# Patient Record
Sex: Female | Born: 1935 | Race: White | Hispanic: No | Smoking: Former smoker
Health system: Southern US, Community
[De-identification: ages and names within clinical notes are randomized; demographics above are authoritative.]

## PROBLEM LIST (undated history)

## (undated) DIAGNOSIS — M545 Low back pain, unspecified: Secondary | ICD-10-CM

## (undated) DIAGNOSIS — Z973 Presence of spectacles and contact lenses: Secondary | ICD-10-CM

## (undated) DIAGNOSIS — T8859XA Other complications of anesthesia, initial encounter: Secondary | ICD-10-CM

## (undated) DIAGNOSIS — Z923 Personal history of irradiation: Secondary | ICD-10-CM

## (undated) DIAGNOSIS — I2584 Coronary atherosclerosis due to calcified coronary lesion: Principal | ICD-10-CM

## (undated) DIAGNOSIS — T4145XA Adverse effect of unspecified anesthetic, initial encounter: Secondary | ICD-10-CM

## (undated) DIAGNOSIS — Z860100 Personal history of colon polyps, unspecified: Secondary | ICD-10-CM

## (undated) DIAGNOSIS — R002 Palpitations: Principal | ICD-10-CM

## (undated) DIAGNOSIS — Z9109 Other allergy status, other than to drugs and biological substances: Secondary | ICD-10-CM

## (undated) DIAGNOSIS — R0602 Shortness of breath: Secondary | ICD-10-CM

## (undated) DIAGNOSIS — I2510 Atherosclerotic heart disease of native coronary artery without angina pectoris: Principal | ICD-10-CM

## (undated) DIAGNOSIS — J449 Chronic obstructive pulmonary disease, unspecified: Secondary | ICD-10-CM

## (undated) DIAGNOSIS — K219 Gastro-esophageal reflux disease without esophagitis: Secondary | ICD-10-CM

## (undated) DIAGNOSIS — J189 Pneumonia, unspecified organism: Secondary | ICD-10-CM

## (undated) DIAGNOSIS — M1990 Unspecified osteoarthritis, unspecified site: Secondary | ICD-10-CM

## (undated) DIAGNOSIS — I499 Cardiac arrhythmia, unspecified: Secondary | ICD-10-CM

## (undated) DIAGNOSIS — D126 Benign neoplasm of colon, unspecified: Secondary | ICD-10-CM

## (undated) DIAGNOSIS — E049 Nontoxic goiter, unspecified: Secondary | ICD-10-CM

## (undated) DIAGNOSIS — C342 Malignant neoplasm of middle lobe, bronchus or lung: Secondary | ICD-10-CM

## (undated) DIAGNOSIS — E785 Hyperlipidemia, unspecified: Secondary | ICD-10-CM

## (undated) DIAGNOSIS — M810 Age-related osteoporosis without current pathological fracture: Secondary | ICD-10-CM

## (undated) DIAGNOSIS — G8929 Other chronic pain: Secondary | ICD-10-CM

## (undated) DIAGNOSIS — Z8719 Personal history of other diseases of the digestive system: Secondary | ICD-10-CM

## (undated) DIAGNOSIS — I493 Ventricular premature depolarization: Secondary | ICD-10-CM

## (undated) DIAGNOSIS — C50911 Malignant neoplasm of unspecified site of right female breast: Secondary | ICD-10-CM

## (undated) DIAGNOSIS — Z9981 Dependence on supplemental oxygen: Secondary | ICD-10-CM

## (undated) HISTORY — PX: TOTAL ABDOMINAL HYSTERECTOMY: SHX209

## (undated) HISTORY — PX: CATARACT EXTRACTION W/ INTRAOCULAR LENS  IMPLANT, BILATERAL: SHX1307

## (undated) HISTORY — DX: Coronary atherosclerosis due to calcified coronary lesion: I25.84

## (undated) HISTORY — DX: Nontoxic goiter, unspecified: E04.9

## (undated) HISTORY — PX: COLONOSCOPY W/ BIOPSIES: SHX1374

## (undated) HISTORY — DX: Malignant neoplasm of middle lobe, bronchus or lung: C34.2

## (undated) HISTORY — DX: Benign neoplasm of colon, unspecified: D12.6

## (undated) HISTORY — DX: Atherosclerotic heart disease of native coronary artery without angina pectoris: I25.10

## (undated) HISTORY — DX: Personal history of colonic polyps: Z86.010

## (undated) HISTORY — PX: TONSILLECTOMY: SUR1361

## (undated) HISTORY — DX: Ventricular premature depolarization: I49.3

## (undated) HISTORY — DX: Palpitations: R00.2

## (undated) HISTORY — DX: Hyperlipidemia, unspecified: E78.5

## (undated) HISTORY — PX: DILATION AND CURETTAGE OF UTERUS: SHX78

## (undated) HISTORY — PX: APPENDECTOMY: SHX54

## (undated) HISTORY — DX: Personal history of colon polyps, unspecified: Z86.0100

---

## 1998-01-09 ENCOUNTER — Other Ambulatory Visit: Admission: RE | Admit: 1998-01-09 | Discharge: 1998-01-09 | Payer: Self-pay | Admitting: Obstetrics and Gynecology

## 1999-01-23 ENCOUNTER — Other Ambulatory Visit: Admission: RE | Admit: 1999-01-23 | Discharge: 1999-01-23 | Payer: Self-pay | Admitting: Obstetrics and Gynecology

## 2000-02-22 ENCOUNTER — Other Ambulatory Visit: Admission: RE | Admit: 2000-02-22 | Discharge: 2000-02-22 | Payer: Self-pay | Admitting: Obstetrics and Gynecology

## 2001-03-10 ENCOUNTER — Other Ambulatory Visit: Admission: RE | Admit: 2001-03-10 | Discharge: 2001-03-10 | Payer: Self-pay | Admitting: Obstetrics and Gynecology

## 2001-09-27 ENCOUNTER — Ambulatory Visit: Admission: RE | Admit: 2001-09-27 | Discharge: 2001-09-27 | Payer: Self-pay | Admitting: Obstetrics and Gynecology

## 2001-10-02 ENCOUNTER — Ambulatory Visit (HOSPITAL_COMMUNITY): Admission: RE | Admit: 2001-10-02 | Discharge: 2001-10-02 | Payer: Self-pay | Admitting: Internal Medicine

## 2002-05-07 ENCOUNTER — Other Ambulatory Visit: Admission: RE | Admit: 2002-05-07 | Discharge: 2002-05-07 | Payer: Self-pay | Admitting: Obstetrics and Gynecology

## 2003-07-01 ENCOUNTER — Other Ambulatory Visit: Admission: RE | Admit: 2003-07-01 | Discharge: 2003-07-01 | Payer: Self-pay | Admitting: Obstetrics and Gynecology

## 2003-09-18 ENCOUNTER — Ambulatory Visit (HOSPITAL_COMMUNITY): Admission: RE | Admit: 2003-09-18 | Discharge: 2003-09-18 | Payer: Self-pay | Admitting: Gastroenterology

## 2003-09-18 ENCOUNTER — Encounter (INDEPENDENT_AMBULATORY_CARE_PROVIDER_SITE_OTHER): Payer: Self-pay | Admitting: Specialist

## 2005-09-02 ENCOUNTER — Other Ambulatory Visit: Admission: RE | Admit: 2005-09-02 | Discharge: 2005-09-02 | Payer: Self-pay | Admitting: Obstetrics and Gynecology

## 2005-09-27 ENCOUNTER — Encounter: Admission: RE | Admit: 2005-09-27 | Discharge: 2005-09-27 | Payer: Self-pay | Admitting: Obstetrics and Gynecology

## 2005-10-10 ENCOUNTER — Encounter: Admission: RE | Admit: 2005-10-10 | Discharge: 2005-10-10 | Payer: Self-pay | Admitting: Obstetrics and Gynecology

## 2005-10-20 ENCOUNTER — Ambulatory Visit: Payer: Self-pay | Admitting: Oncology

## 2006-03-30 ENCOUNTER — Ambulatory Visit: Payer: Self-pay | Admitting: Pulmonary Disease

## 2006-06-16 ENCOUNTER — Emergency Department (HOSPITAL_COMMUNITY): Admission: EM | Admit: 2006-06-16 | Discharge: 2006-06-16 | Payer: Self-pay | Admitting: Emergency Medicine

## 2010-02-23 ENCOUNTER — Encounter: Admission: RE | Admit: 2010-02-23 | Discharge: 2010-02-23 | Payer: Self-pay | Admitting: Family Medicine

## 2010-08-07 ENCOUNTER — Other Ambulatory Visit: Payer: Self-pay | Admitting: Family Medicine

## 2010-08-07 DIAGNOSIS — E049 Nontoxic goiter, unspecified: Secondary | ICD-10-CM

## 2010-08-07 DIAGNOSIS — E041 Nontoxic single thyroid nodule: Secondary | ICD-10-CM

## 2010-08-18 ENCOUNTER — Ambulatory Visit
Admission: RE | Admit: 2010-08-18 | Discharge: 2010-08-18 | Disposition: A | Discharge status: Home / Self Care | Payer: Medicare Other | Source: Ambulatory Visit | Attending: Family Medicine | Admitting: Family Medicine

## 2010-08-18 DIAGNOSIS — E041 Nontoxic single thyroid nodule: Secondary | ICD-10-CM

## 2010-08-18 DIAGNOSIS — E049 Nontoxic goiter, unspecified: Secondary | ICD-10-CM

## 2010-10-23 NOTE — Assessment & Plan Note (Signed)
[facility]                               PULMONARY OFFICE NOTE   NAME:Heather Barnes, Heather Barnes                   MRN:          [id number]  DATE:03/30/2006                            DOB:          [birth year]    HISTORY OF PRESENT ILLNESS:  The patient is a 77 year old female who I have  been asked to see for chronic obstructive pulmonary disease.  The patient  has a history of longstanding smoking and carries the diagnosis of  emphysema.  She recently underwent pulmonary function studies this month  where she was found to be have an FEV1 of 570 cc and an FEV1 percent of 52.  This is consistent with moderate to severe chronic  emphysema.  The patient  currently is being managed on Advair as well as Atrovent inhaler.  She was  tried on Spiriva, but had difficulties with hives and also sores in her  mouth.  Patient states that she has been short of breath for about 12 years,  and that it has been slowly progressive.  She will get dyspneic with  vacuuming one room of her house, making a bed or even bringing one bag of  groceries in from the car.  She has approximately 2 to 3 block dyspnea on  exertion, but admits it is difficult for her to estimate this.  She has no  significant cough or mucus production since she has quit smoking.   PAST MEDICAL HISTORY:  1. Significant for emphysema as stated above.  2. Allergic rhinitis.  3. Status post hysterectomy.  4. Status post appendectomy.   CURRENT MEDICATIONS:  1. Advair 250/50 one b.i.d.  2. Atrovent inhaler 2 sprays q. 6 hours.  3. Fosamax 70 mg q. week.  4. Lipitor 20 mg q. daily.  5. Calcium q. daily.  6. Centrum Silver q. daily.   ALLERGIES:  She has an intolerance to codeine.   SOCIAL HISTORY:  She is married and has children.  She lives with her  husband.  She has a history of smoking 1 pack a day for many years, but has  not done so since 2001.   FAMILY HISTORY:  Remarkable for her mother  having heart disease, but not  until age 77.  She has an extensive history of cancer with sisters having  throat, lung and brain cancer, also a daughter with breast cancer.   REVIEW OF SYMPTOMS:  As per history of present illness.  Also, see patient  intake form documented in the chart.   PHYSICAL EXAMINATION:  GENERAL:  She is a well-developed female, in no acute  distress.  Blood pressure is 140/78.  Pulse 81.  Temperature is 98.  Weight is 134  pounds.  02 saturation on room air is 94%.  HEENT:  Pupils equal, round and reactive to light and accommodation.  Extraocular muscles are intact.  Nares are without discharge.  Oropharynx is  clear.  NECK:  Supple without JVD or lymphadenopathy.  There is no palpable  thyromegaly.  CHEST:  Reveals decreased breath sounds throughout, but no active wheezing.  CARDIAC:  Exam  reveals regular rate and rhythm.  No murmurs, rubs or  gallops.  ABDOMEN:  Soft and non-tender with good bowel sounds.  GENITAL EXAM:  Not done and not indicated.  RECTAL EXAM:  Not done and not indicated.  BREAST EXAM:  Not done and not indicated.  EXTREMITIES:  Lower extremities show trace edema.  Pulses are intact  distally.  NEUROLOGIC:  She is alert and oriented with no obvious motor deficits.   IMPRESSION:  Moderate to severe emphysema documented by pulmonary function  studies.  The patient has noticed increasing dyspnea on exertion that has  been slowly progressive and definitely interferes with her quality of life.  She has gotten down to a low enough FEV1 that this is not unexpected.  She  is on a fairly good regimen of bronchodilators, and it is unfortunate that  she is not able to tolerate Spiriva.  At some point in time she may need to  change over to nebulizers, but I would not do that at this point in time.  I  have stressed to her the importance of getting on some type of exercise  program such as pulmonary rehab and also keeping her weight under  control.  I have also talked with her about keeping up to date on her Pneumovax and  flu vaccine.   PLAN:  1. No further recommendations in terms of medication changes.  I do not      think the patient needs nebulizers at this point in time, but may get      there very quickly over the next year or two.  2. I have strongly encouraged the patient to participate in a pulmonary      rehab program at [facility], however, she states that she does      not have time for this.  She will try to pursue this on her own at      home.  3. The patient does not need supplemental oxygen at this time.  We have      documented no evidence for 02 desaturation with vigorous walking in the      office.  4. The patient will follow up on a p.r.n. basis, however, I will be more      than happy to see at any time should problems arise.    ______________________________  Barbaraann Share, MD,FCCP    [facility]  DD: 04/11/2006  DT: 04/12/2006  Job #: [id number]   cc:   Pam Drown, M.D.

## 2010-10-23 NOTE — Op Note (Signed)
NAME:  Heather Barnes, Heather Barnes                      ACCOUNT NO.:  [id number]   MEDICAL RECORD NO.:  [id number]                   PATIENT TYPE:  AMB   LOCATION:  ENDO                                 FACILITY:  [facility]   PHYSICIAN:  Graylin Shiver, M.D.                DATE OF BIRTH:  [birth year]   DATE OF PROCEDURE:  09/18/2003  DATE OF DISCHARGE:                                 OPERATIVE REPORT   PROCEDURE:  Colonoscopy with biopsy.   INDICATIONS FOR PROCEDURE:  Screening.   DESCRIPTION OF PROCEDURE:  Informed consent was obtained after explanation  of the risks of bleeding, infection, and perforation.  Premedications are fentanyl 75 mcg IV, Versed 6 mg IV.   With the patient in the left lateral decubitus position, the rectal exam was  performed.  No masses were felt.  The Olympus colonoscope was inserted into  the rectum and advanced around the colon to the cecum.  Cecal landmarks were  identified.  In the cecum, there was a small 3 mm polyp biopsied off with  cold forceps.  The ascending colon looked normal.  The transverse colon  looked normal.  The descending colon looked normal.  In the distal sigmoid  and rectum, there were several small 2-3 mm polyps biopsied off with cold  forceps.  She tolerated the procedure well without complications.   IMPRESSION:  Several small colon polyps.   PLAN:  The pathology will be checked.                                               Graylin Shiver, M.D.    [facility]  D:  09/18/2003  T:  09/18/2003  Job:  [id number]   cc:   Pam Drown, M.D.  [address]  [place]  [place] [address]  Fax: [phone]

## 2010-12-25 ENCOUNTER — Other Ambulatory Visit: Payer: Self-pay | Admitting: Family Medicine

## 2010-12-25 ENCOUNTER — Ambulatory Visit
Admission: RE | Admit: 2010-12-25 | Discharge: 2010-12-25 | Disposition: A | Discharge status: Home / Self Care | Payer: Medicare Other | Source: Ambulatory Visit | Attending: Family Medicine | Admitting: Family Medicine

## 2010-12-25 DIAGNOSIS — R911 Solitary pulmonary nodule: Secondary | ICD-10-CM

## 2010-12-25 MED ORDER — IOHEXOL 300 MG/ML  SOLN: 75.0000 mL | Freq: Once | INTRAMUSCULAR | Status: AC | PRN

## 2011-01-12 ENCOUNTER — Encounter: Payer: Self-pay | Admitting: Internal Medicine

## 2011-01-13 ENCOUNTER — Ambulatory Visit (INDEPENDENT_AMBULATORY_CARE_PROVIDER_SITE_OTHER): Payer: Medicare Other | Admitting: Internal Medicine

## 2011-01-13 ENCOUNTER — Encounter: Payer: Self-pay | Admitting: Internal Medicine

## 2011-01-13 ENCOUNTER — Telehealth: Payer: Self-pay | Admitting: Internal Medicine

## 2011-01-13 VITALS — BP 130/68 | HR 99 | Temp 98.9°F | Ht 59.0 in | Wt 129.0 lb

## 2011-01-13 DIAGNOSIS — R0789 Other chest pain: Secondary | ICD-10-CM

## 2011-01-13 DIAGNOSIS — J449 Chronic obstructive pulmonary disease, unspecified: Secondary | ICD-10-CM

## 2011-01-13 DIAGNOSIS — R0602 Shortness of breath: Secondary | ICD-10-CM

## 2011-01-13 DIAGNOSIS — J984 Other disorders of lung: Secondary | ICD-10-CM

## 2011-01-13 DIAGNOSIS — R911 Solitary pulmonary nodule: Secondary | ICD-10-CM

## 2011-01-13 NOTE — Telephone Encounter (Signed)
Please ensure oncimmune blood test. If you do it next 2-3 days then she should fu with me 3 weeks later approx so I can rview her with results

## 2011-01-13 NOTE — Assessment & Plan Note (Signed)
#  chest pain  - i think this is due to rib joint inflammation  - because you cannot take drugs like ibuprofen, continue hot compress  - please follow with primary care for this

## 2011-01-13 NOTE — Progress Notes (Signed)
Subjective:    Patient ID: Heather Barnes, female    DOB: 05-03-1934, 77 y.o.   MRN: [id number]  HPI  77 year old ex 20 pack  Smoker quit 2000. REferred for severe dyspnea, copd on ct chest, and pulmonary nodule  1. Dyspnea: insidious onset 6-8 years ago when asthma was diagnosed by Dr Corliss Blacker at [facility]. Recollects seeing someone at [facility] pulmonary back then and started on spiriva but stopped due to rash. Prior advair Rx did not help. Currently on symbicort and atrovent which help. Associated tiredness present. Progressive symptoms esp few months.  Dyspnea also worse and now brought ton by activities like taking a shower/bath  X 2-3 months which "wipes me out". Describes it as severe when it comes on. No prior O2 treatment but when we walked her she desaturated after walking 185 feet x __ laps in room air. Wants to be able to do better to be with grandkids  2. Chest pain - Acute onset few weeks ago when she woke up. She could not move at that time and rated it as severe. Location was  left infra-clavicular and supra-mammary area. Improved by singulair that was started few weeks ago but feels when it effect wears off in morning pain worse. Symbicort helps pain. Feels it is a soreness. Constant. With meds it is currently very mmild (notices it when she touches it). At its worst, moderate. However, at onset it was severe. Says pred burst has helped pain (though pred made her have tremors). She feels it is radiating to back and left arm and feels she might have pulled a muscle. EKG at PMD office normal. DEnies prior CAD or cold or cough or sputum. Currently pain is improving. Does not want to do NSAId due to fear of bleedin (prior GI ulcer +)  3. Ct chest 12/25/10 done for above with IV contrast  -  No comment about PE. severe emphysema and new RML lateral segment subpleural 8mm nodule (new since 12/25/10).. This is first time she learned of emphysema diagnosis.  Sisters and brothers (total 3)  - died of cancer. She is very worried about possible malignancy. Does not want spouse to know of nodule right now.     Past Medical History  Diagnosis Date  . Asthma   . Hx of colonic polyps   . Colon adenomas   . Osteoporosis   . Hyperlipidemia   . Goiter      Family History  Problem Relation Age of Onset  . Emphysema Maternal Uncle   . Heart disease Mother   . Heart disease Maternal Grandfather   . Rheum arthritis Sister   . Cancer Sister   . Cancer Sister     throat cancer  . Brain cancer Sister   . Ovarian cancer Sister      History   Social History  . Marital Status: Married    Spouse Name: N/A    Number of Children: N/A  . Years of Education: N/A   Occupational History  . retired    Social History Main Topics  . Smoking status: Former Smoker -- 0.5 packs/day for 40 years    Types: Cigarettes    Quit date: 06/07/1998  . Smokeless tobacco: Not on file  . Alcohol Use: Not on file  . Drug Use: Not on file  . Sexually Active: Not on file   Other Topics Concern  . Not on file   Social History Narrative  . No narrative on  file     Allergies  Allergen Reactions  . Spiriva Handihaler     rash     Outpatient Prescriptions Prior to Visit  Medication Sig Dispense Refill  . albuterol (PROAIR HFA) 108 (90 BASE) MCG/ACT inhaler Inhale 2 puffs into the lungs every 6 (six) hours as needed.        [name] alendronate (FOSAMAX) 70 MG tablet Take 70 mg by mouth every 7 (seven) days. Take with a full glass of water on an empty stomach.       . budesonide-formoterol (SYMBICORT) 160-4.5 MCG/ACT inhaler Inhale 2 puffs into the lungs 2 (two) times daily.        . cetirizine (ZYRTEC) 10 MG tablet Take 10 mg by mouth daily.        . Cholecalciferol (VITAMIN D) 2000 UNITS CAPS Take 2 capsules by mouth daily.        . cyclobenzaprine (FLEXERIL) 5 MG tablet Take 5 mg by mouth 3 (three) times daily as needed.        . fluticasone (FLONASE) 50 MCG/ACT nasal spray Place 2 sprays  into the nose daily.        [name] ipratropium (ATROVENT HFA) 17 MCG/ACT inhaler Inhale 2 puffs into the lungs every 6 (six) hours.        . Multiple Vitamins-Minerals (MULTIVITAMIN WITH MINERALS) tablet Take 1 tablet by mouth daily.            Review of Systems  Constitutional: Negative for fever and unexpected weight change.  HENT: Negative for ear pain, nosebleeds, congestion, sore throat, rhinorrhea, sneezing, trouble swallowing, dental problem, postnasal drip and sinus pressure.   Eyes: Negative for redness and itching.  Respiratory: Positive for shortness of breath. Negative for cough, chest tightness and wheezing.   Cardiovascular: Positive for chest pain. Negative for palpitations and leg swelling.  Gastrointestinal: Negative for nausea and vomiting.  Genitourinary: Negative for dysuria.  Musculoskeletal: Negative for joint swelling.  Skin: Negative for rash.  Neurological: Negative for headaches.  Hematological: Does not bruise/bleed easily.  Psychiatric/Behavioral: Negative for dysphoric mood. The patient is not nervous/anxious.        Objective:   Physical Exam  Vitals reviewed. Constitutional: She is oriented to person, place, and time. She appears well-developed and well-nourished. No distress.  HENT:  Head: Normocephalic and atraumatic.  Right Ear: External ear normal.  Left Ear: External ear normal.  Mouth/Throat: Oropharynx is clear and moist. No oropharyngeal exudate.  Eyes: Conjunctivae and EOM are normal. Pupils are equal, round, and reactive to light. Right eye exhibits no discharge. Left eye exhibits no discharge. No scleral icterus.  Neck: Normal range of motion. Neck supple. No JVD present. No tracheal deviation present. No thyromegaly present.  Cardiovascular: Normal rate, regular rhythm, normal heart sounds and intact distal pulses.  Exam reveals no gallop and no friction rub.   No murmur heard. Pulmonary/Chest: Effort normal and breath sounds normal. No  respiratory distress. She has no wheezes. She has no rales. She exhibits no tenderness.       barrell chest Overall prolonged expiration Focal tenderness at left 3-4th costochondral junction  Abdominal: Soft. Bowel sounds are normal. She exhibits no distension and no mass. There is no tenderness. There is no rebound and no guarding.  Musculoskeletal: Normal range of motion. She exhibits no edema and no tenderness.  Lymphadenopathy:    She has no cervical adenopathy.  Neurological: She is alert and oriented to person, place, and time. She has normal reflexes.  No cranial nerve deficit. She exhibits normal muscle tone. Coordination normal.  Skin: Skin is warm and dry. No rash noted. She is not diaphoretic. No erythema. No pallor.  Psychiatric: She has a normal mood and affect. Her behavior is normal. Judgment and thought content normal.       Tears easy when bad news broken to          Assessment & Plan:

## 2011-01-13 NOTE — Assessment & Plan Note (Addendum)
#  Shortness of breath  - due to copd which I think is severe  - you need oxygen 2L with exertion and when you sleep  - continue your current meds  - we will refer you to pulmonary rehab  - have pft breathing test in 3 weeks or so - check alpha 1 next visit

## 2011-01-13 NOTE — Patient Instructions (Addendum)
#  chest pain  - i think this is due to rib joint inflammation  - because you cannot take drugs like ibuprofen, continue hot compress  - please follow with primary care for this #Shortness of breath  - due to copd which I think is severe  - you need oxygen 2L with exertion and when you sleep  - continue your current meds  - we will refer you to pulmonary rehab  - have pft breathing test in 3 weeks or so - check alpha 1 next visit #Lung nodule  - do blood test for oncimmune antigen panel (currently kit not available) - you need repeat CT chest without contrast 3 months from 12/25/2010 #followup - call in 2-4 days to see if we have our blood test kit - return in 3-4 weeks for followup with full PFT breathing test

## 2011-01-13 NOTE — Assessment & Plan Note (Addendum)
#  Lung nodule - 8mm RML lateral segment nodule that is new. Intermediate prob for malignancy  PLAN  - do blood test for oncimmune antigen panel (currently kit not available) - you need repeat CT chest without contrast 3 months from 12/25/2010 #followup - call in 2-4 days to see if we have our blood test kit - return in 3-4 weeks for followup with full PFT breathing test

## 2011-01-14 NOTE — Telephone Encounter (Signed)
Pt advised that onc-immune test is here. Carron Curie, CMA

## 2011-01-19 ENCOUNTER — Telehealth: Payer: Self-pay | Admitting: Internal Medicine

## 2011-01-19 NOTE — Telephone Encounter (Signed)
Per Victorino Dike, this was done yesterday morning, Jan 18, 2011 and [facility] has already been notified.  Will sign off on this.

## 2011-01-26 ENCOUNTER — Telehealth: Payer: Self-pay | Admitting: Internal Medicine

## 2011-01-26 NOTE — Telephone Encounter (Signed)
Reviewed oncimmune - one of those antigens is positive raising possibility nodule is cancer. I will print out formal print out when I am in office; currntly online. I do not want to discuss result over phone. SHe teared up at last visit and does not want husband to know about nodule. So, I will discuss with her directly. If she calls, please direct call to me

## 2011-01-27 ENCOUNTER — Telehealth: Payer: Self-pay | Admitting: Internal Medicine

## 2011-01-27 NOTE — Telephone Encounter (Signed)
ono 01/19/11 desaturates. Needs o2. I will d/w her at fu

## 2011-02-05 ENCOUNTER — Encounter: Payer: Self-pay | Admitting: Internal Medicine

## 2011-02-16 ENCOUNTER — Encounter (HOSPITAL_COMMUNITY)
Admission: RE | Admit: 2011-02-16 | Discharge: 2011-02-16 | Disposition: A | Discharge status: Home / Self Care | Payer: Medicare Other | Source: Ambulatory Visit | Attending: Internal Medicine | Admitting: Internal Medicine

## 2011-02-16 DIAGNOSIS — Z9981 Dependence on supplemental oxygen: Secondary | ICD-10-CM | POA: Insufficient documentation

## 2011-02-16 DIAGNOSIS — R0602 Shortness of breath: Secondary | ICD-10-CM | POA: Insufficient documentation

## 2011-02-16 DIAGNOSIS — R0609 Other forms of dyspnea: Secondary | ICD-10-CM | POA: Insufficient documentation

## 2011-02-16 DIAGNOSIS — R0989 Other specified symptoms and signs involving the circulatory and respiratory systems: Secondary | ICD-10-CM | POA: Insufficient documentation

## 2011-02-16 DIAGNOSIS — J449 Chronic obstructive pulmonary disease, unspecified: Secondary | ICD-10-CM | POA: Insufficient documentation

## 2011-02-16 DIAGNOSIS — J4489 Other specified chronic obstructive pulmonary disease: Secondary | ICD-10-CM | POA: Insufficient documentation

## 2011-02-16 DIAGNOSIS — Z5189 Encounter for other specified aftercare: Secondary | ICD-10-CM | POA: Insufficient documentation

## 2011-02-17 ENCOUNTER — Ambulatory Visit (INDEPENDENT_AMBULATORY_CARE_PROVIDER_SITE_OTHER): Payer: Medicare Other | Admitting: Internal Medicine

## 2011-02-17 ENCOUNTER — Encounter: Payer: Self-pay | Admitting: Internal Medicine

## 2011-02-17 VITALS — BP 130/62 | HR 87 | Temp 97.8°F | Ht 60.0 in | Wt 128.0 lb

## 2011-02-17 DIAGNOSIS — J984 Other disorders of lung: Secondary | ICD-10-CM

## 2011-02-17 DIAGNOSIS — R911 Solitary pulmonary nodule: Secondary | ICD-10-CM

## 2011-02-17 DIAGNOSIS — J449 Chronic obstructive pulmonary disease, unspecified: Secondary | ICD-10-CM

## 2011-02-17 LAB — PULMONARY FUNCTION TEST

## 2011-02-17 NOTE — Progress Notes (Signed)
Subjective:    Patient ID: Heather Barnes, female    DOB: 1933/11/25, 77 y.o.   MRN: [id number]  HPI 77 year old ex 20 pack  Smoker quit 2000. REferred for severe dyspnea, copd on ct chest, and pulmonary nodule  1. Dyspnea: insidious onset 6-8 years ago when asthma was diagnosed by Dr Corliss Blacker at [facility]. Recollects seeing someone at [facility] pulmonary back then and started on spiriva but stopped due to rash. Prior advair Rx did not help. Currently on symbicort and atrovent which help. Associated tiredness present. Progressive symptoms esp few months.  Dyspnea also worse and now brought ton by activities like taking a shower/bath  X 2-3 months which "wipes me out". Describes it as severe when it comes on. No prior O2 treatment but when we walked her she desaturated after walking 185 feet x __ laps in room air. Wants to be able to do better to be with grandkids  2. Chest pain - Acute onset few weeks ago when she woke up. She could not move at that time and rated it as severe. Location was  left infra-clavicular and supra-mammary area. Improved by singulair that was started few weeks ago but feels when it effect wears off in morning pain worse. Symbicort helps pain. Feels it is a soreness. Constant. With meds it is currently very mmild (notices it when she touches it). At its worst, moderate. However, at onset it was severe. Says pred burst has helped pain (though pred made her have tremors). She feels it is radiating to back and left arm and feels she might have pulled a muscle. EKG at PMD office normal. DEnies prior CAD or cold or cough or sputum. Currently pain is improving. Does not want to do NSAId due to fear of bleedin (prior GI ulcer +)  3. Ct chest 12/25/10 done for above with IV contrast  -  No comment about PE. severe emphysema and new RML lateral segment subpleural 8mm nodule (new since 12/25/10).. This is first time she learned of emphysema diagnosis.  Sisters and brothers (total 3) -  died of cancer. She is very worried about possible malignancy. Does not want spouse to know of nodule right now.   REC #chest pain  - i think this is due to rib joint inflammation  - because you cannot take drugs like ibuprofen, continue hot compress  - please follow with primary care for this  #Shortness of breath  - due to copd which I think is severe  - you need oxygen 2L with exertion and when you sleep  - continue your current meds  - we will refer you to pulmonary rehab  - have pft breathing test in 3 weeks or so  - check alpha 1 next visit  #Lung nodule  - do blood test for oncimmune antigen panel (currently kit not available)  - you need repeat CT chest without contrast 3 months from 12/25/2010  #followup  - call in 2-4 days to see if we have our blood test kit  - return in 3-4 weeks for followup with full PFT breathing test  OV 02/17/11: Followup COPD/dyspnea after PFT, pulmonary nodule and chest pain (atypical). Since last visit chest pain resolved. Finds o2and inhalers helps. No new complaints. She is here to discuss pft results and serum oncimmune test results. PFTs show gold stage 3 copd - fev1 0.6L/43%, ratio 44, dlco 5/34%. SErum onciummne test shows 1 antigen positive for lung cancer (highly specific test with high  test positive predictive value). Her next ct scan is  Due mid-oct 2012  Past Medical History  Diagnosis Date  . Asthma   . Hx of colonic polyps   . Colon adenomas   . Osteoporosis   . Hyperlipidemia   . Goiter      Family History  Problem Relation Age of Onset  . Emphysema Maternal Uncle   . Heart disease Mother   . Heart disease Maternal Grandfather   . Rheum arthritis Sister   . Cancer Sister   . Cancer Sister     throat cancer  . Brain cancer Sister   . Ovarian cancer Sister      History   Social History  . Marital Status: Married    Spouse Name: N/A    Number of Children: N/A  . Years of Education: N/A   Occupational History  .  retired    Social History Main Topics  . Smoking status: Former Smoker -- 0.5 packs/day for 40 years    Types: Cigarettes    Quit date: 06/07/1998  . Smokeless tobacco: Not on file  . Alcohol Use: Not on file  . Drug Use: Not on file  . Sexually Active: Not on file   Other Topics Concern  . Not on file   Social History Narrative  . No narrative on file     Allergies  Allergen Reactions  . Spiriva Handihaler     rash     Outpatient Prescriptions Prior to Visit  Medication Sig Dispense Refill  . albuterol (PROAIR HFA) 108 (90 BASE) MCG/ACT inhaler Inhale 2 puffs into the lungs every 6 (six) hours as needed.        [name] alendronate (FOSAMAX) 70 MG tablet Take 70 mg by mouth every 7 (seven) days. Take with a full glass of water on an empty stomach.       . budesonide-formoterol (SYMBICORT) 160-4.5 MCG/ACT inhaler Inhale 2 puffs into the lungs 2 (two) times daily.        . cetirizine (ZYRTEC) 10 MG tablet Take 10 mg by mouth daily.        . Cholecalciferol (VITAMIN D) 2000 UNITS CAPS Take 2 capsules by mouth daily.        . cyclobenzaprine (FLEXERIL) 5 MG tablet Take 5 mg by mouth 3 (three) times daily as needed.        . fluticasone (FLONASE) 50 MCG/ACT nasal spray Place 2 sprays into the nose daily.        [name] ipratropium (ATROVENT HFA) 17 MCG/ACT inhaler Inhale 2 puffs into the lungs every 6 (six) hours.        . Multiple Vitamins-Minerals (MULTIVITAMIN WITH MINERALS) tablet Take 1 tablet by mouth daily.        [name] SINGULAIR 10 MG tablet Take 1 tablet by mouth Daily.              Review of Systems  Constitutional: Negative for fever and unexpected weight change.  HENT: Negative for ear pain, nosebleeds, congestion, sore throat, rhinorrhea, sneezing, trouble swallowing, dental problem, postnasal drip and sinus pressure.   Eyes: Negative for redness and itching.  Respiratory: Negative for cough, chest tightness, shortness of breath and wheezing.   Cardiovascular: Negative for  palpitations and leg swelling.  Gastrointestinal: Negative for nausea and vomiting.  Genitourinary: Negative for dysuria.  Musculoskeletal: Negative for joint swelling.  Skin: Negative for rash.  Neurological: Negative for headaches.  Hematological: Does not bruise/bleed easily.  Psychiatric/Behavioral: Negative for  dysphoric mood. The patient is not nervous/anxious.        Objective:   Physical Exam  Vitals reviewed. Constitutional: She is oriented to person, place, and time. She appears well-developed and well-nourished. No distress.  HENT:  Head: Normocephalic and atraumatic.  Right Ear: External ear normal.  Left Ear: External ear normal.  Mouth/Throat: Oropharynx is clear and moist. No oropharyngeal exudate.  Eyes: Conjunctivae and EOM are normal. Pupils are equal, round, and reactive to light. Right eye exhibits no discharge. Left eye exhibits no discharge. No scleral icterus.  Neck: Normal range of motion. Neck supple. No JVD present. No tracheal deviation present. No thyromegaly present.  Cardiovascular: Normal rate, regular rhythm, normal heart sounds and intact distal pulses.  Exam reveals no gallop and no friction rub.   No murmur heard. Pulmonary/Chest: Effort normal and breath sounds normal. No respiratory distress. She has no wheezes. She has no rales. She exhibits no tenderness.       barrell chest Overall prolonged expiration Focal tenderness at left 3-4th costochondral junction  Abdominal: Soft. Bowel sounds are normal. She exhibits no distension and no mass. There is no tenderness. There is no rebound and no guarding.  Musculoskeletal: Normal range of motion. She exhibits no edema and no tenderness.  Lymphadenopathy:    She has no cervical adenopathy.  Neurological: She is alert and oriented to person, place, and time. She has normal reflexes. No cranial nerve deficit. She exhibits normal muscle tone. Coordination normal.  Skin: Skin is warm and dry. No rash noted.  She is not diaphoretic. No erythema. No pallor.  Psychiatric: She has a normal mood and affect. Her behavior is normal. Judgment and thought content normal.       Tears easy when bad news broken to         Assessment & Plan:

## 2011-02-17 NOTE — Assessment & Plan Note (Addendum)
Nodule is small 8mm RML lateral nodule. Too small to biopsy via CT guided bx but serum onciummne panel is suggestive this could be malignancy. Will get the 3 month CT in mid-oct 2012. IF still peristent, get ENB v CT guided bx- risks and limitations of non-diagnosis of both procedures explained. Potential therapeutic options of wedge resection with xrt seeds versus XRT explained. She agrees to proceed with plan  25 min visit.  Total.  > 50% of time in counseling

## 2011-02-17 NOTE — Progress Notes (Signed)
PFT done today. 

## 2011-02-17 NOTE — Assessment & Plan Note (Signed)
#  COPD - you have gold stage 3, severe copd - educated on severity of copd, anticipated exacerbation, recognition, qualuty of life and mortality issues - advised to wear o2   - please continue your medications  - will send blood for alpha 1 genetic test for copd  - congrats on flu shot  - i educated you on how to recognize copd attacks; if this happens please call us immediately or go to ER - at next visit we can discuss on medications to substitute to cut costs (will switch to nebs)

## 2011-02-17 NOTE — Patient Instructions (Signed)
#  COPD  - please continue your medications  - will send blood for alpha 1 genetic test for copd  - congrats on flu shot  - i educated you on how to recognize copd attacks; if this happens please call us immediately or go to ER - at next visit we can discuss on medications to substitute to cut costs #Lung nodule  - have CT scan SUPER DIMENSION CT CHEST wihtout contrast around mid-October 2012. HAve it around 03/17/11 or so instead of 03/28/12  - my nurse will change your current CT chest order to reflect above #Followup  - after ct scan

## 2011-02-18 ENCOUNTER — Encounter (HOSPITAL_COMMUNITY): Payer: Medicare Other

## 2011-02-23 ENCOUNTER — Encounter (HOSPITAL_COMMUNITY): Payer: Medicare Other

## 2011-02-25 ENCOUNTER — Encounter (HOSPITAL_COMMUNITY): Payer: Medicare Other

## 2011-03-02 ENCOUNTER — Encounter (HOSPITAL_COMMUNITY): Payer: Medicare Other

## 2011-03-02 ENCOUNTER — Telehealth: Payer: Self-pay | Admitting: Internal Medicine

## 2011-03-02 DIAGNOSIS — J449 Chronic obstructive pulmonary disease, unspecified: Secondary | ICD-10-CM

## 2011-03-02 NOTE — Telephone Encounter (Signed)
Heather Barnes, Met with [place] in rehab. She is desaturating with exertion on RA to 86% after . She has o2 but is not portable. Please set her up with portabl o2. Dont know what order to put for this; so if you helop me with order will be great.   Thanks MR

## 2011-03-04 ENCOUNTER — Encounter (HOSPITAL_COMMUNITY): Payer: Medicare Other

## 2011-03-05 NOTE — Telephone Encounter (Signed)
Order has been placed. Alanea Woolridge, CMA  

## 2011-03-09 ENCOUNTER — Encounter (HOSPITAL_COMMUNITY): Payer: Medicare Other | Attending: Internal Medicine

## 2011-03-09 DIAGNOSIS — J4489 Other specified chronic obstructive pulmonary disease: Secondary | ICD-10-CM | POA: Insufficient documentation

## 2011-03-09 DIAGNOSIS — R0609 Other forms of dyspnea: Secondary | ICD-10-CM | POA: Insufficient documentation

## 2011-03-09 DIAGNOSIS — R0602 Shortness of breath: Secondary | ICD-10-CM | POA: Insufficient documentation

## 2011-03-09 DIAGNOSIS — R0989 Other specified symptoms and signs involving the circulatory and respiratory systems: Secondary | ICD-10-CM | POA: Insufficient documentation

## 2011-03-09 DIAGNOSIS — Z9981 Dependence on supplemental oxygen: Secondary | ICD-10-CM | POA: Insufficient documentation

## 2011-03-09 DIAGNOSIS — J449 Chronic obstructive pulmonary disease, unspecified: Secondary | ICD-10-CM | POA: Insufficient documentation

## 2011-03-09 DIAGNOSIS — Z5189 Encounter for other specified aftercare: Secondary | ICD-10-CM | POA: Insufficient documentation

## 2011-03-11 ENCOUNTER — Encounter (HOSPITAL_COMMUNITY): Payer: Medicare Other

## 2011-03-15 ENCOUNTER — Telehealth: Payer: Self-pay | Admitting: Internal Medicine

## 2011-03-15 ENCOUNTER — Ambulatory Visit
Admission: RE | Admit: 2011-03-15 | Discharge: 2011-03-15 | Disposition: A | Discharge status: Home / Self Care | Payer: Medicare Other | Source: Ambulatory Visit | Attending: Internal Medicine | Admitting: Internal Medicine

## 2011-03-15 DIAGNOSIS — R911 Solitary pulmonary nodule: Secondary | ICD-10-CM

## 2011-03-15 NOTE — Telephone Encounter (Signed)
I spoke with Heather Barnes and she states she had already solved the problem her self. She states the super D disc will come via courier. Will forward to MR so he is aware. Please advise Thanks  Carver Fila, CMA

## 2011-03-16 ENCOUNTER — Encounter (HOSPITAL_COMMUNITY): Payer: Medicare Other

## 2011-03-17 ENCOUNTER — Ambulatory Visit (INDEPENDENT_AMBULATORY_CARE_PROVIDER_SITE_OTHER): Payer: Medicare Other | Admitting: Internal Medicine

## 2011-03-17 ENCOUNTER — Encounter: Payer: Self-pay | Admitting: Internal Medicine

## 2011-03-17 VITALS — BP 130/50 | HR 86 | Temp 97.9°F | Ht 60.0 in | Wt 126.8 lb

## 2011-03-17 DIAGNOSIS — J984 Other disorders of lung: Secondary | ICD-10-CM

## 2011-03-17 DIAGNOSIS — R911 Solitary pulmonary nodule: Secondary | ICD-10-CM

## 2011-03-17 NOTE — Assessment & Plan Note (Addendum)
RML lateral segment 8-70mm nodule subpleural - new since 2008 as of July 2012. Perists oct 2010. Serum lung cancer antigen test positive in July 2012. High pre-test for NSCLC  PLAN - get PET scan - based on results have to decide between CT guided TTNA bx v ENB - explained risks (ptx, hemothorax, sedation risks) of both, limitations (non -diagnostic) and benefits (obtaining diagnosis) - explained that if NSCLC she will not be lobectomy candidate due to poor pulmonary reserve - Unclear if she is an curative XRT candidate (have emailed Dr. Michell Heinrich, patient has reservations about xrt due to health experience of same of dtr) versus wedge resection with XRT seeds (they like Dr. Edwyna Shell, husband was his patient) - Will d/w radiologist and map super D CT airway and get PET scan and decided best bx option  > 16 min face to face counseling. > 50% of time in discussion

## 2011-03-17 NOTE — Patient Instructions (Signed)
Please do PET scan  I will talk to radiologist, bronchoscopist and possibly Dr Edwyna Shell and get back to you with a plan If you do not hear from [place] by next week Friday please call back

## 2011-03-17 NOTE — Progress Notes (Signed)
Subjective:    Patient ID: Heather Barnes, female    DOB: [birth year], 77 y.o.   MRN: [id number]  HPI 77 year old ex 20 pack  Smoker quit 2000. REferred for severe dyspnea, copd on ct chest, and pulmonary nodule  1. Dyspnea: insidious onset 6-8 years ago when asthma was diagnosed by Dr Corliss Blacker at [facility]. Recollects seeing someone at [facility] pulmonary back then and started on spiriva but stopped due to rash. Prior advair Rx did not help. Currently on symbicort and atrovent which help. Associated tiredness present. Progressive symptoms esp few months.  Dyspnea also worse and now brought ton by activities like taking a shower/bath  X 2-3 months which "wipes me out". Describes it as severe when it comes on. No prior O2 treatment but when we walked her she desaturated after walking 185 feet x __ laps in room air. Wants to be able to do better to be with grandkids  2. Chest pain - Acute onset few weeks ago when she woke up. She could not move at that time and rated it as severe. Location was  left infra-clavicular and supra-mammary area. Improved by singulair that was started few weeks ago but feels when it effect wears off in morning pain worse. Symbicort helps pain. Feels it is a soreness. Constant. With meds it is currently very mmild (notices it when she touches it). At its worst, moderate. However, at onset it was severe. Says pred burst has helped pain (though pred made her have tremors). She feels it is radiating to back and left arm and feels she might have pulled a muscle. EKG at PMD office normal. DEnies prior CAD or cold or cough or sputum. Currently pain is improving. Does not want to do NSAId due to fear of bleedin (prior GI ulcer +)  3. Ct chest 12/25/10 done for above with IV contrast  -  No comment about PE. severe emphysema and new RML lateral segment subpleural 8mm nodule (new since 12/25/10).. This is first time she learned of emphysema diagnosis.  Sisters and brothers (total 3) -  died of cancer. She is very worried about possible malignancy. Does not want spouse to know of nodule right now.   REC #chest pain  - i think this is due to rib joint inflammation  - because you cannot take drugs like ibuprofen, continue hot compress  - please follow with primary care for this  #Shortness of breath  - due to copd which I think is severe  - you need oxygen 2L with exertion and when you sleep  - continue your current meds  - we will refer you to pulmonary rehab  - have pft breathing test in 3 weeks or so  - check alpha 1 next visit  #Lung nodule  - do blood test for oncimmune antigen panel (currently kit not available)  - you need repeat CT chest without contrast 3 months from 12/25/2010  #followup  - call in 2-4 days to see if we have our blood test kit  - return in 3-4 weeks for followup with full PFT breathing test  OV 02/17/11: Followup COPD/dyspnea after PFT, pulmonary nodule and chest pain (atypical). Since last visit chest pain resolved. Finds o2and inhalers helps. No new complaints. She is here to discuss pft results and serum oncimmune test results. PFTs show gold stage 3 copd - fev1 0.6L/43%, ratio 44, dlco 5/34%. SErum onciummne test shows 1 antigen positive for lung cancer (highly specific test with high  test positive predictive value). Her next ct scan is  Due mid-oct 2012  #COPD  - please continue your medications  - will send blood for alpha 1 genetic test for copd  - congrats on flu shot  - i educated you on how to recognize copd attacks; if this happens please call us immediately or go to ER  - at next visit we can discuss on medications to substitute to cut costs  #Lung nodule  - have CT scan SUPER DIMENSION CT CHEST wihtout contrast around mid-October 2012. HAve it around 03/17/11 or so instead of 03/28/12  - my nurse will change your current CT chest order to reflect above  #Followup  - after ct scan   OV 03/17/11: Followup for above. Had CT chest  superdimension 2 days ago. Shows persistence of RML lateral segment supleural nearly 1cm nodule compared to July 2012. Dr Fredirick Lathe radiologist rates it as high suspicion for NSCLC. She is here with husband to discuss options of mgmt decicision of nodule. Rates pulmonary health as improved mainly due to o2 and rehab; improved effort tolerance, better sleep, more energetic. We are not discussing pulmonary med mgmt today  Review of Systems  Constitutional: Negative for fever and unexpected weight change.  HENT: Negative for ear pain, nosebleeds, congestion, sore throat, rhinorrhea, sneezing, trouble swallowing, dental problem, postnasal drip and sinus pressure.   Eyes: Negative for redness and itching.  Respiratory: Negative for cough, chest tightness, shortness of breath and wheezing.   Cardiovascular: Negative for palpitations and leg swelling.  Gastrointestinal: Negative for nausea and vomiting.  Genitourinary: Negative for dysuria.  Musculoskeletal: Negative for joint swelling.  Skin: Negative for rash.  Neurological: Negative for headaches.  Hematological: Does not bruise/bleed easily.  Psychiatric/Behavioral: Negative for dysphoric mood. The patient is not nervous/anxious.        Objective:   Physical Exam Vitals reviewed. Constitutional: She is oriented to person, place, and time. She appears well-developed and well-nourished. No distress.  HENT:  Head: Normocephalic and atraumatic.  Right Ear: External ear normal.  Left Ear: External ear normal.  Mouth/Throat: Oropharynx is clear and moist. No oropharyngeal exudate.  Eyes: Conjunctivae and EOM are normal. Pupils are equal, round, and reactive to light. Right eye exhibits no discharge. Left eye exhibits no discharge. No scleral icterus.  Neck: Normal range of motion. Neck supple. No JVD present. No tracheal deviation present. No thyromegaly present.  Cardiovascular: Normal rate, regular rhythm, normal heart sounds and intact distal  pulses.  Exam reveals no gallop and no friction rub.   No murmur heard. Pulmonary/Chest: Effort normal and breath sounds normal. No respiratory distress. She has no wheezes. She has no rales. She exhibits no tenderness.       barrell chest Overall prolonged expiration Abdominal: Soft. Bowel sounds are normal. She exhibits no distension and no mass. There is no tenderness. There is no rebound and no guarding.  Musculoskeletal: Normal range of motion. She exhibits no edema and no tenderness.  Lymphadenopathy:    She has no cervical adenopathy.  Neurological: She is alert and oriented to person, place, and time. She has normal reflexes. No cranial nerve deficit. She exhibits normal muscle tone. Coordination normal.  Skin: Skin is warm and dry. No rash noted. She is not diaphoretic. No erythema. No pallor.  Psychiatric: She has a normal mood and affect. Her behavior is normal. Judgment and thought content normal.           Assessment & Plan:

## 2011-03-18 ENCOUNTER — Encounter (HOSPITAL_COMMUNITY): Payer: Medicare Other

## 2011-03-23 ENCOUNTER — Encounter (HOSPITAL_COMMUNITY): Payer: Medicare Other

## 2011-03-24 ENCOUNTER — Encounter (HOSPITAL_COMMUNITY): Payer: Self-pay

## 2011-03-24 ENCOUNTER — Encounter (HOSPITAL_COMMUNITY)
Admission: RE | Admit: 2011-03-24 | Discharge: 2011-03-24 | Disposition: A | Discharge status: Home / Self Care | Payer: Medicare Other | Source: Ambulatory Visit | Attending: Internal Medicine | Admitting: Internal Medicine

## 2011-03-24 DIAGNOSIS — J984 Other disorders of lung: Secondary | ICD-10-CM | POA: Insufficient documentation

## 2011-03-24 DIAGNOSIS — R911 Solitary pulmonary nodule: Secondary | ICD-10-CM | POA: Insufficient documentation

## 2011-03-24 DIAGNOSIS — Z79899 Other long term (current) drug therapy: Secondary | ICD-10-CM | POA: Insufficient documentation

## 2011-03-24 MED ORDER — FLUDEOXYGLUCOSE F - 18 (FDG) INJECTION
18.9000 | Freq: Once | INTRAVENOUS | Status: AC | PRN
  Administered 2011-03-24: 18.9 via INTRAVENOUS

## 2011-03-25 ENCOUNTER — Encounter (HOSPITAL_COMMUNITY): Payer: Medicare Other

## 2011-03-29 ENCOUNTER — Other Ambulatory Visit: Payer: Medicare Other

## 2011-03-30 ENCOUNTER — Encounter (HOSPITAL_COMMUNITY): Payer: Medicare Other

## 2011-04-01 ENCOUNTER — Encounter (HOSPITAL_COMMUNITY): Payer: Medicare Other

## 2011-04-02 ENCOUNTER — Telehealth: Payer: Self-pay | Admitting: Internal Medicine

## 2011-04-02 NOTE — Telephone Encounter (Signed)
Please let her know  A) sorry for delay B) PEt scan shows the right lung spot is HOT and very suggestive of cancer (she knows this already) so ok to tell on phone C) I spoke to colleagues and it definitely needs biopsy. I am deciding between biospy through radi

## 2011-04-02 NOTE — Telephone Encounter (Signed)
Spoke to patient  - PET is HOT. Gaive results. Dr Richarda Overlie of IR feels he can do Ct guided TTNA. Dr. Lurline Hare of Rad onc feels XRT good option but wants bx first. Awaiting on super-dimension mapping activating code to decide if best to go TTNA of ENB. Will let her know by mon/tudsay 04/06/11  MR

## 2011-04-02 NOTE — Telephone Encounter (Signed)
Pt is calling for results of PET scan done on 03/24/2011. Pls advise.

## 2011-04-06 ENCOUNTER — Telehealth: Payer: Self-pay | Admitting: Internal Medicine

## 2011-04-06 ENCOUNTER — Encounter (HOSPITAL_COMMUNITY): Payer: Medicare Other

## 2011-04-06 DIAGNOSIS — R911 Solitary pulmonary nodule: Secondary | ICD-10-CM

## 2011-04-06 NOTE — Telephone Encounter (Signed)
Heather Barnes  I spoke to patient on Friday 10/26 and said I would get back by today. I went through software for biopsyin the lung nodule by bronch and this is not the best method. I spoke to Dr Richarda Overlie of IR and he thinks he can biopsy the nodule with good chance of success.   So, please set up for Interventional Radioliogy CT guided TRANS THORACIC BIOPSY of RIGHT MIDDLE LOBE  LUNG NODULE. She should stop aspirin, plavix, coumadin 5 days before procedure (med list shows she is not on any of this but double check). Please set it up within this week possibly. Risks are bleeding, pneumothorax, non-diagnosis (explained to her) but you can metnion it  If she is confused or you are uncomforable  let me know. I thnk ok for you to communicate because patient has been briefed about this once during OV and on Friday 10/26 over phone  FU with me 2-3 days aftter procdure  MR

## 2011-04-06 NOTE — Telephone Encounter (Signed)
Spoke with pt and advised her of MR recs. She is ok to proceed with biopsy, so order placed. I verified that the pt is not on aspirin. Plavix, or coumadin.  She denied any further questions at this time. Carron Curie, CMA

## 2011-04-07 ENCOUNTER — Telehealth: Payer: Self-pay | Admitting: Internal Medicine

## 2011-04-07 NOTE — Telephone Encounter (Signed)
Spoke to patient. She is scheduled for IR guided biopsy oif nodule 1 week from now. She has no questions about procedure and understands the risks and benefits. Will see her after bx

## 2011-04-08 ENCOUNTER — Encounter (HOSPITAL_COMMUNITY): Payer: Medicare Other | Attending: Internal Medicine

## 2011-04-08 ENCOUNTER — Telehealth: Payer: Self-pay | Admitting: Internal Medicine

## 2011-04-08 DIAGNOSIS — Z5189 Encounter for other specified aftercare: Secondary | ICD-10-CM | POA: Insufficient documentation

## 2011-04-08 DIAGNOSIS — R0609 Other forms of dyspnea: Secondary | ICD-10-CM | POA: Insufficient documentation

## 2011-04-08 DIAGNOSIS — Z9981 Dependence on supplemental oxygen: Secondary | ICD-10-CM | POA: Insufficient documentation

## 2011-04-08 DIAGNOSIS — R0602 Shortness of breath: Secondary | ICD-10-CM | POA: Insufficient documentation

## 2011-04-08 DIAGNOSIS — J449 Chronic obstructive pulmonary disease, unspecified: Secondary | ICD-10-CM | POA: Insufficient documentation

## 2011-04-08 DIAGNOSIS — R0989 Other specified symptoms and signs involving the circulatory and respiratory systems: Secondary | ICD-10-CM | POA: Insufficient documentation

## 2011-04-08 DIAGNOSIS — J4489 Other specified chronic obstructive pulmonary disease: Secondary | ICD-10-CM | POA: Insufficient documentation

## 2011-04-08 NOTE — Telephone Encounter (Signed)
Discussed at [facility] today - meeting attended by Dr Edwyna Shell, Dr Mitzi Hansen of rad onc Dr Reche Dixon of radiology and myself. Others present Dr Shirline Frees. Dr [place] Ro of path. Consensus  A. bilaterl hilar pet uptake is reactive and symmetric and not pathological B. Contralateral front rib fracture - formal result will be appended. Suspect non-malignant C. RML nodule is c;w malignancy. Doubt surgical candidate. Biopsy through CT guided TTNA preferred - risk of pneumothorax present due to copd. But overall consensus was preferred to have diagnostic biopsy over empiric XRT Rx. After conference discussed with Dr. Lonia Skinner (last week I had Dr Richarda Overlie) look at CT . They felt amenable to biopsy with success of positive result but moderate risk of small pneumothorax exists but should be acceptable
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
## 2011-04-09 ENCOUNTER — Encounter (HOSPITAL_COMMUNITY): Payer: Self-pay | Admitting: Pharmacy Technician

## 2011-04-13 ENCOUNTER — Other Ambulatory Visit: Payer: Self-pay | Admitting: Neurology

## 2011-04-13 ENCOUNTER — Encounter (HOSPITAL_COMMUNITY): Payer: Medicare Other

## 2011-04-13 MED ORDER — SODIUM CHLORIDE 0.9 % IV SOLN: INTRAVENOUS | Status: DC

## 2011-04-14 ENCOUNTER — Ambulatory Visit (HOSPITAL_COMMUNITY)
Admission: RE | Admit: 2011-04-14 | Discharge: 2011-04-14 | Disposition: A | Discharge status: Home / Self Care | Payer: Medicare Other | Source: Ambulatory Visit | Attending: Internal Medicine | Admitting: Internal Medicine

## 2011-04-14 ENCOUNTER — Ambulatory Visit (HOSPITAL_COMMUNITY)
Admission: RE | Admit: 2011-04-14 | Discharge: 2011-04-14 | Disposition: A | Discharge status: Home / Self Care | Payer: Medicare Other | Source: Ambulatory Visit | Attending: Interventional Radiology | Admitting: Interventional Radiology

## 2011-04-14 ENCOUNTER — Encounter (HOSPITAL_COMMUNITY): Payer: Self-pay

## 2011-04-14 DIAGNOSIS — E785 Hyperlipidemia, unspecified: Secondary | ICD-10-CM | POA: Insufficient documentation

## 2011-04-14 DIAGNOSIS — Z01818 Encounter for other preprocedural examination: Secondary | ICD-10-CM | POA: Insufficient documentation

## 2011-04-14 DIAGNOSIS — J449 Chronic obstructive pulmonary disease, unspecified: Secondary | ICD-10-CM | POA: Insufficient documentation

## 2011-04-14 DIAGNOSIS — M810 Age-related osteoporosis without current pathological fracture: Secondary | ICD-10-CM | POA: Insufficient documentation

## 2011-04-14 DIAGNOSIS — R911 Solitary pulmonary nodule: Secondary | ICD-10-CM

## 2011-04-14 DIAGNOSIS — J4489 Other specified chronic obstructive pulmonary disease: Secondary | ICD-10-CM | POA: Insufficient documentation

## 2011-04-14 DIAGNOSIS — C342 Malignant neoplasm of middle lobe, bronchus or lung: Secondary | ICD-10-CM | POA: Insufficient documentation

## 2011-04-14 DIAGNOSIS — R0602 Shortness of breath: Secondary | ICD-10-CM | POA: Insufficient documentation

## 2011-04-14 HISTORY — DX: Shortness of breath: R06.02

## 2011-04-14 HISTORY — DX: Chronic obstructive pulmonary disease, unspecified: J44.9

## 2011-04-14 LAB — CBC
HCT: 41.5 % (ref 36.0–46.0)
Hemoglobin: 13.7 g/dL (ref 12.0–15.0)
MCV: 87 fL (ref 78.0–100.0)
RBC: 4.77 MIL/uL (ref 3.87–5.11)
RDW: 14.3 % (ref 11.5–15.5)
WBC: 7.5 10*3/uL (ref 4.0–10.5)

## 2011-04-14 LAB — APTT: aPTT: 39 seconds — ABNORMAL HIGH (ref 24–37)

## 2011-04-14 MED ORDER — FENTANYL CITRATE 0.05 MG/ML IJ SOLN
INTRAMUSCULAR | Status: AC | PRN
  Administered 2011-04-14: 25 ug via INTRAVENOUS

## 2011-04-14 MED ORDER — MIDAZOLAM HCL 2 MG/2ML IJ SOLN
INTRAMUSCULAR | Status: AC
  Filled 2011-04-14: qty 4

## 2011-04-14 MED ORDER — LORAZEPAM 2 MG/ML IJ SOLN
INTRAMUSCULAR | Status: AC | PRN
  Administered 2011-04-14: 1 mg via INTRAVENOUS

## 2011-04-14 MED ORDER — SODIUM CHLORIDE 0.9 % IV SOLN
INTRAVENOUS | Status: DC
  Administered 2011-04-14: 08:00:00 via INTRAVENOUS

## 2011-04-14 MED ORDER — SODIUM CHLORIDE 0.9 % IV SOLN
INTRAVENOUS | Status: DC | PRN
  Administered 2011-04-14: 50 mL/h via INTRAVENOUS

## 2011-04-14 MED ORDER — FENTANYL CITRATE 0.05 MG/ML IJ SOLN
INTRAMUSCULAR | Status: AC
  Filled 2011-04-14: qty 4

## 2011-04-14 NOTE — H&P (Signed)
Heather Barnes is an 77 y.o. female.   Chief Complaint: Right middle lobe nodule. Remote tobacco abuse. HPI: nodule noted during treatment in July for asthmatic exacerbation. Hypermetabolic activity on PET worrisome for malignancy. No prior history of malignancy   Past Medical History  Diagnosis Date  . Asthma   . Hx of colonic polyps   . Colon adenomas   . Osteoporosis   . Hyperlipidemia   . Goiter   . Shortness of breath   . COPD (chronic obstructive pulmonary disease)     Past Surgical History  Procedure Date  . Total abdominal hysterectomy   . Tonsillectomy   . Eye surgery   . Colonoscopy w/ biopsies     Family History  Problem Relation Age of Onset  . Emphysema Maternal Uncle   . Heart disease Mother   . Heart disease Maternal Grandfather   . Rheum arthritis Sister   . Cancer Sister   . Cancer Sister     throat cancer  . Brain cancer Sister   . Ovarian cancer Sister   . Breast cancer Daughter    Social History:  reports that she quit smoking about 12 years ago. Her smoking use included Cigarettes. She has a 20 pack-year smoking history. She does not have any smokeless tobacco history on file. Her alcohol and drug histories not on file.  Allergies:  Allergies  Allergen Reactions  . Codeine Other (See Comments)    Hyperactivity,crying  . Spiriva Handihaler     rash    Medications Prior to Admission  Medication Sig Dispense Refill  . albuterol (PROAIR HFA) 108 (90 BASE) MCG/ACT inhaler Inhale 2 puffs into the lungs every 6 (six) hours as needed.        . budesonide-formoterol (SYMBICORT) 160-4.5 MCG/ACT inhaler Inhale 2 puffs into the lungs 2 (two) times daily.        . cetirizine (ZYRTEC) 10 MG tablet Take 10 mg by mouth daily.        . Cholecalciferol (VITAMIN D) 2000 UNITS CAPS Take 2 capsules by mouth daily.        [name] ipratropium (ATROVENT HFA) 17 MCG/ACT inhaler Inhale 2 puffs into the lungs every 6 (six) hours.        . Multiple Vitamins-Minerals  (MULTIVITAMIN WITH MINERALS) tablet Take 1 tablet by mouth daily.        [name] SINGULAIR 10 MG tablet Take 1 tablet by mouth Daily.      [name] alendronate (FOSAMAX) 70 MG tablet Take 70 mg by mouth every 7 (seven) days. Take with a full glass of water on an empty stomach.       . fluticasone (FLONASE) 50 MCG/ACT nasal spray Place 2 sprays into the nose as needed.        Medications Prior to Admission  Medication Dose Route Frequency Provider Last Rate Last Dose  . 0.9 %  sodium chloride infusion   Intravenous Continuous Dayne Oley Balm III 20 mL/hr at 04/14/11 0827    . fentaNYL (SUBLIMAZE) 0.05 MG/ML injection           . midazolam (VERSED) 2 MG/2ML injection           . DISCONTD: 0.9 %  sodium chloride infusion   Intravenous Continuous Kalman Shan, MD      . DISCONTD: 0.9 %  sodium chloride infusion   Intravenous Continuous Delton See, PA        Results for orders placed during the hospital encounter of 04/14/11 (  from the past 48 hour(s))  CBC     Status: Normal   Collection Time   04/14/11  7:45 AM      Component Value Range Comment   WBC 7.5  4.0 - 10.5 (K/uL)    RBC 4.77  3.87 - 5.11 (MIL/uL)    Hemoglobin 13.7  12.0 - 15.0 (g/dL)    HCT 16.1  09.6 - 04.5 (%)    MCV 87.0  78.0 - 100.0 (fL)    MCH 28.7  26.0 - 34.0 (pg)    MCHC 33.0  30.0 - 36.0 (g/dL)    RDW 40.9  81.1 - 91.4 (%)    Platelets 214  150 - 400 (K/uL)   PROTIME-INR     Status: Normal   Collection Time   04/14/11  7:45 AM      Component Value Range Comment   Prothrombin Time 12.9  11.6 - 15.2 (seconds)    INR 0.95  0.00 - 1.49    APTT     Status: Abnormal   Collection Time   04/14/11  7:45 AM      Component Value Range Comment   aPTT 39 (*) 24 - 37 (seconds)    No results found.  Review of Systems  Constitutional: Negative for fever and chills.  Respiratory: Positive for shortness of breath and wheezing.   Cardiovascular: Negative for chest pain.  Gastrointestinal: Negative for abdominal pain.     There were no vitals taken for this visit. Physical Exam  Constitutional: She is oriented to person, place, and time. She appears well-developed.  HENT:  Head: Normocephalic and atraumatic.  Cardiovascular: Normal rate, regular rhythm, S1 normal, S2 normal and normal heart sounds.  Exam reveals no gallop.   No murmur heard. Respiratory: No respiratory distress. She has decreased breath sounds in the right upper field, the right lower field, the left upper field and the left lower field. She has no wheezes. She has no rhonchi. She has no rales.  Musculoskeletal: She exhibits no edema.  Neurological: She is oriented to person, place, and time.  Skin: Skin is warm and dry.  Psychiatric: She has a normal mood and affect.     Assessment/Plan RML nodule concerning for malignancy given hypermetabolic activity on PET scan - for percutaneous needle biopsy today.   Cariah Salatino D 04/14/2011, 9:01 AM

## 2011-04-14 NOTE — Procedures (Signed)
Successful RML nodule CT Fluoro FNA BX x 2 Small amt RML and RLL pulmonary Hemorrhage No Ptx Stable condition Quickstain positive for malignancy.  Final path pending Short stay recovery

## 2011-04-15 ENCOUNTER — Encounter (HOSPITAL_COMMUNITY): Payer: Medicare Other

## 2011-04-20 ENCOUNTER — Encounter (HOSPITAL_COMMUNITY): Payer: Medicare Other

## 2011-04-20 ENCOUNTER — Telehealth: Payer: Self-pay | Admitting: Internal Medicine

## 2011-04-20 NOTE — Telephone Encounter (Signed)
MR please advise results thanks

## 2011-04-20 NOTE — Telephone Encounter (Signed)
Pt scheduled to come in for appt on 04-23-11 at 9:30am.Heather Barnes, CMA

## 2011-04-20 NOTE — Telephone Encounter (Signed)
Biopsy shows NSCLC. THis result never flowed into my results in EPIC though I ordered it. Do not know why. Anyways, I spoke to patient. Offered her choice of getting results on form or face-to-face. She preferred coming in and talking to me face-to-face. So results not given Please give the appointment for this coming Friday which would be 04/23/1999  in the morning. Please add on. Also, I believe I had asked for followup appointment 2-3 days after procedure but I'm not sure if this instruction was carried out or not. I also plan to discuss the results at multidisciplinary thoracic oncology conference this Thursday

## 2011-04-20 NOTE — Telephone Encounter (Signed)
Heather Barnes when do you want to have this pt come in on Friday?

## 2011-04-22 ENCOUNTER — Encounter (HOSPITAL_COMMUNITY): Payer: Medicare Other

## 2011-04-23 ENCOUNTER — Encounter: Payer: Self-pay | Admitting: Internal Medicine

## 2011-04-23 ENCOUNTER — Ambulatory Visit (INDEPENDENT_AMBULATORY_CARE_PROVIDER_SITE_OTHER): Payer: Medicare Other | Admitting: Internal Medicine

## 2011-04-23 ENCOUNTER — Ambulatory Visit (INDEPENDENT_AMBULATORY_CARE_PROVIDER_SITE_OTHER)
Admission: RE | Admit: 2011-04-23 | Discharge: 2011-04-23 | Disposition: A | Discharge status: Home / Self Care | Payer: Medicare Other | Source: Ambulatory Visit | Attending: Internal Medicine | Admitting: Internal Medicine

## 2011-04-23 DIAGNOSIS — R911 Solitary pulmonary nodule: Secondary | ICD-10-CM

## 2011-04-23 DIAGNOSIS — C342 Malignant neoplasm of middle lobe, bronchus or lung: Secondary | ICD-10-CM

## 2011-04-23 DIAGNOSIS — R042 Hemoptysis: Secondary | ICD-10-CM

## 2011-04-23 DIAGNOSIS — J449 Chronic obstructive pulmonary disease, unspecified: Secondary | ICD-10-CM

## 2011-04-23 DIAGNOSIS — J4489 Other specified chronic obstructive pulmonary disease: Secondary | ICD-10-CM

## 2011-04-23 DIAGNOSIS — C3490 Malignant neoplasm of unspecified part of unspecified bronchus or lung: Secondary | ICD-10-CM

## 2011-04-23 MED ORDER — DOXYCYCLINE HYCLATE 100 MG PO TABS: 100.0000 mg | ORAL_TABLET | Freq: Two times a day (BID) | ORAL | Status: AC

## 2011-04-23 NOTE — Progress Notes (Signed)
Subjective:    Patient ID: Heather Barnes, female    DOB: January 17, 1934, 77 y.o.   MRN: [id number]  HPI 77 year old ex 20 pack  Smoker quit 2000. REferred for severe dyspnea, copd on ct chest, and pulmonary nodule  1. Dyspnea: insidious onset 6-8 years ago when asthma was diagnosed by Dr Corliss Blacker at [facility]. Recollects seeing someone at [facility] pulmonary back then and started on spiriva but stopped due to rash. Prior advair Rx did not help. Currently on symbicort and atrovent which help. Associated tiredness present. Progressive symptoms esp few months.  Dyspnea also worse and now brought ton by activities like taking a shower/bath  X 2-3 months which "wipes me out". Describes it as severe when it comes on. No prior O2 treatment but when we walked her she desaturated after walking 185 feet x __ laps in room air. Wants to be able to do better to be with grandkids  2. Chest pain - Acute onset few weeks ago when she woke up. She could not move at that time and rated it as severe. Location was  left infra-clavicular and supra-mammary area. Improved by singulair that was started few weeks ago but feels when it effect wears off in morning pain worse. Symbicort helps pain. Feels it is a soreness. Constant. With meds it is currently very mmild (notices it when she touches it). At its worst, moderate. However, at onset it was severe. Says pred burst has helped pain (though pred made her have tremors). She feels it is radiating to back and left arm and feels she might have pulled a muscle. EKG at PMD office normal. DEnies prior CAD or cold or cough or sputum. Currently pain is improving. Does not want to do NSAId due to fear of bleedin (prior GI ulcer +)  3. Ct chest 12/25/10 done for above with IV contrast  -  No comment about PE. severe emphysema and new RML lateral segment subpleural 8mm nodule (new since 12/25/10).. This is first time she learned of emphysema diagnosis.  Sisters and brothers (total 3) -  died of cancer. She is very worried about possible malignancy. Does not want spouse to know of nodule right now.   REC #chest pain  - i think this is due to rib joint inflammation  - because you cannot take drugs like ibuprofen, continue hot compress  - please follow with primary care for this  #Shortness of breath  - due to copd which I think is severe  - you need oxygen 2L with exertion and when you sleep  - continue your current meds  - we will refer you to pulmonary rehab  - have pft breathing test in 3 weeks or so  - check alpha 1 next visit  #Lung nodule  - do blood test for oncimmune antigen panel (currently kit not available)  - you need repeat CT chest without contrast 3 months from 12/25/2010  #followup  - call in 2-4 days to see if we have our blood test kit  - return in 3-4 weeks for followup with full PFT breathing test  OV 02/17/11: Followup COPD/dyspnea after PFT, pulmonary nodule and chest pain (atypical). Since last visit chest pain resolved. Finds o2and inhalers helps. No new complaints. She is here to discuss pft results and serum oncimmune test results. PFTs show gold stage 3 copd - fev1 0.6L/43%, ratio 44, dlco 5/34%. SErum onciummne test shows 1 antigen positive for lung cancer (highly specific test with high  test positive predictive value). Her next ct scan is  Due mid-oct 2012  #COPD  - please continue your medications  - will send blood for alpha 1 genetic test for copd  - congrats on flu shot  - i educated you on how to recognize copd attacks; if this happens please call us immediately or go to ER  - at next visit we can discuss on medications to substitute to cut costs  #Lung nodule  - have CT scan SUPER DIMENSION CT CHEST wihtout contrast around mid-October 2012. HAve it around 03/17/11 or so instead of 03/28/12  - my nurse will change your current CT chest order to reflect above  #Followup  - after ct scan   OV 03/17/11: Followup for above. Had CT chest  superdimension 2 days ago. Shows persistence of RML lateral segment supleural nearly 1cm nodule compared to July 2012. Dr Fredirick Lathe radiologist rates it as high suspicion for NSCLC. She is here with husband to discuss options of mgmt decicision of nodule. Rates pulmonary health as improved mainly due to o2 and rehab; improved effort tolerance, better sleep, more energetic. We are not discussing pulmonary med mgmt today  Please do PET scan  I will talk to radiologist, bronchoscopist and possibly Dr Edwyna Shell and get back to you with a plan  If you do not hear from [place] by next week Friday please call back       OV 04/23/2011: Fu after lung bx. She had PET - nodule was hot. No other uptake (discussed at [facility] prior to bx as well). MTOC feeling (see phone note) was not a surgical candidate for lobectomy but possibly for wedge resection with seeds. Bx 04/14/11 - NSCLC. Tolerated bx well. Post bx CXR showed local hge but not ptx. Immediate post bx phase well but last 3 days having early morning mild, small amount of hemoptysis (old dried blood). Not present rest of day. Also, same time  New nasal drainage that is clear. Denies increased, cough, wheeze, edema, chest pain, symcope. Walking desaturation test today: 185 feet x 3 laps; pulse ox lowest was 90%. Spirometry shows: Fev1 today: 0.58L/33%; and almost gold stage 4 copd. CXR today shows resolution  of post biopsy changes and looks fairly clear  No change in past, social, family hx    Review of Systems  Constitutional: Negative for fever and unexpected weight change.  HENT: Positive for rhinorrhea and postnasal drip. Negative for ear pain, nosebleeds, congestion, sore throat, sneezing, trouble swallowing, dental problem and sinus pressure.   Eyes: Negative for redness and itching.  Respiratory: Positive for shortness of breath and wheezing. Negative for cough and chest tightness.   Cardiovascular: Negative for palpitations and leg swelling.    Gastrointestinal: Negative for nausea and vomiting.  Genitourinary: Negative for dysuria.  Musculoskeletal: Negative for joint swelling.  Skin: Negative for rash.  Neurological: Negative for headaches.  Hematological: Does not bruise/bleed easily.  Psychiatric/Behavioral: Negative for dysphoric mood. The patient is not nervous/anxious.        Objective:   Physical Exam Vitals reviewed. Constitutional: She is oriented to person, place, and time. She appears well-developed and well-nourished. No distress.  HENT:  Head: Normocephalic and atraumatic.  Right Ear: External ear normal.  Left Ear: External ear normal.  Mouth/Throat: Oropharynx is clear and moist. No oropharyngeal exudate.  Eyes: Conjunctivae and EOM are normal. Pupils are equal, round, and reactive to light. Right eye exhibits no discharge. Left eye exhibits no discharge. No scleral icterus.  Neck: Normal range of motion. Neck supple. No JVD present. No tracheal deviation present. No thyromegaly present.  Cardiovascular: Normal rate, regular rhythm, normal heart sounds and intact distal pulses.  Exam reveals no gallop and no friction rub.   No murmur heard. Pulmonary/Chest: Effort normal and breath sounds normal. No respiratory distress. She has no wheezes. She has no rales. She exhibits no tenderness.       barrell chest Overall prolonged expiration Abdominal: Soft. Bowel sounds are normal. She exhibits no distension and no mass. There is no tenderness. There is no rebound and no guarding.  Musculoskeletal: Normal range of motion. She exhibits no edema and no tenderness.  Lymphadenopathy:    She has no cervical adenopathy.  Neurological: She is alert and oriented to person, place, and time. She has normal reflexes. No cranial nerve deficit. She exhibits normal muscle tone. Coordination normal.  Skin: Skin is warm and dry. No rash noted. She is not diaphoretic. No erythema. No pallor.  Psychiatric: She has a normal mood and  affect. Her behavior is normal. Judgment and thought content normal.           Assessment & Plan:

## 2011-04-23 NOTE — Patient Instructions (Signed)
#  Hemoptysis Your chest xray looks better than post biopsy I think the coughing of blood is possibly bronchitis  Take Take doxycycline 100mg  po twice daily x 5 days; take after meals and avoid sunlight If this is worse, call us #COPD Continue your medications for copd and oxygen at night; take symbicort samples #lung cancer  - I think you are at high risk for classic surgery - I would recommend radiation for this issue; please see Dr Lurline Hare (nurse will do referral) #followup  - ROV 2 months or sooner if needed

## 2011-04-26 ENCOUNTER — Encounter: Payer: Self-pay | Admitting: Internal Medicine

## 2011-04-26 ENCOUNTER — Ambulatory Visit: Payer: Medicare Other | Admitting: Internal Medicine

## 2011-04-26 DIAGNOSIS — C342 Malignant neoplasm of middle lobe, bronchus or lung: Secondary | ICD-10-CM | POA: Insufficient documentation

## 2011-04-26 HISTORY — DX: Malignant neoplasm of middle lobe, bronchus or lung: C34.2

## 2011-04-26 NOTE — Assessment & Plan Note (Signed)
She ws noted to have some local hge immediately after bx but the hemoptysis is happening only and is associated with new sinus drainage and is small in amount. CXR is clear. Suspect bronchitis. Rx with doxy  > 10 min in this component. > 50% face to face counseling

## 2011-04-26 NOTE — Assessment & Plan Note (Signed)
NEar stage 4 copd.  Did not desaturate with exrtion this ov (had in past). FEv1 33%  Plan Continue rehab and meds

## 2011-04-26 NOTE — Assessment & Plan Note (Signed)
Broke news of lung cancer; she was anticipating it. Husband also present Stageed as STage 1A. (PET hilar findings cosidered reactive due to low and symmetric uptake) Rx options of lobectomy, local resection with XRT seeds versus primary curativeXRT discussed.; she will not handle surgery in my opinion. THerefore, advised primary xrt. She is aware of xrt due to family member RX for breast cancer. PEr [place] MTOC coordinator via email (I was not in meeting) but apparently surgery was advised but she acknowledged pft was not discussed. I do not agree with that consensus. She has gold stage 4 copd and xrt is more appropriate. Limitations and benefits and side effects of xxrt discussed.  > 15 min in this componenet. > 50% of time in face to face counseling

## 2011-04-27 ENCOUNTER — Encounter (HOSPITAL_COMMUNITY): Payer: Medicare Other

## 2011-04-27 ENCOUNTER — Other Ambulatory Visit: Payer: Self-pay | Admitting: Internal Medicine

## 2011-04-29 ENCOUNTER — Encounter (HOSPITAL_COMMUNITY): Payer: Medicare Other

## 2011-05-04 ENCOUNTER — Encounter (HOSPITAL_COMMUNITY): Payer: Medicare Other

## 2011-05-05 ENCOUNTER — Ambulatory Visit
Admission: RE | Admit: 2011-05-05 | Discharge: 2011-05-05 | Disposition: A | Discharge status: Home / Self Care | Payer: Medicare Other | Source: Ambulatory Visit | Attending: Radiation Oncology | Admitting: Radiation Oncology

## 2011-05-05 ENCOUNTER — Encounter: Payer: Self-pay | Admitting: Radiation Oncology

## 2011-05-05 VITALS — BP 128/75 | HR 84 | Temp 98.2°F | Ht 60.0 in | Wt 124.6 lb

## 2011-05-05 DIAGNOSIS — Z510 Encounter for antineoplastic radiation therapy: Secondary | ICD-10-CM | POA: Insufficient documentation

## 2011-05-05 DIAGNOSIS — E785 Hyperlipidemia, unspecified: Secondary | ICD-10-CM | POA: Insufficient documentation

## 2011-05-05 DIAGNOSIS — Z800 Family history of malignant neoplasm of digestive organs: Secondary | ICD-10-CM | POA: Insufficient documentation

## 2011-05-05 DIAGNOSIS — C342 Malignant neoplasm of middle lobe, bronchus or lung: Secondary | ICD-10-CM

## 2011-05-05 DIAGNOSIS — M810 Age-related osteoporosis without current pathological fracture: Secondary | ICD-10-CM | POA: Insufficient documentation

## 2011-05-05 DIAGNOSIS — J449 Chronic obstructive pulmonary disease, unspecified: Secondary | ICD-10-CM | POA: Insufficient documentation

## 2011-05-05 DIAGNOSIS — Z79899 Other long term (current) drug therapy: Secondary | ICD-10-CM | POA: Insufficient documentation

## 2011-05-05 DIAGNOSIS — Z87891 Personal history of nicotine dependence: Secondary | ICD-10-CM | POA: Insufficient documentation

## 2011-05-05 DIAGNOSIS — Z8041 Family history of malignant neoplasm of ovary: Secondary | ICD-10-CM | POA: Insufficient documentation

## 2011-05-05 DIAGNOSIS — J4489 Other specified chronic obstructive pulmonary disease: Secondary | ICD-10-CM | POA: Insufficient documentation

## 2011-05-05 DIAGNOSIS — Z808 Family history of malignant neoplasm of other organs or systems: Secondary | ICD-10-CM | POA: Insufficient documentation

## 2011-05-05 NOTE — Progress Notes (Signed)
Please see the Nurse Progress Note in the MD Initial Consult Encounter for this patient. 

## 2011-05-05 NOTE — Progress Notes (Signed)
MARRIED 56 YEARS 2 ADULT CHILDREN 1 SON AND 1 DAUGHTER HOMEMAKER  HERE FOR CONSIDERATION OF RADIATION FOR NSCLCA.CHEMOTHERAPY AND SURGERY NOT INDICATIED. ATTENDS REHAB AT  ([facility]) SITE. TO IMPROVE CONDITIONING. FINE NEEDLE ASPIRATION OF RML 04/14/11 NON-SMALL CELL CA. RESOLVING RLL INFILTRATE SEEN ON CXR 04/23/11.

## 2011-05-06 ENCOUNTER — Encounter (HOSPITAL_COMMUNITY): Payer: Medicare Other
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
## 2011-05-11 ENCOUNTER — Ambulatory Visit
Admission: RE | Admit: 2011-05-11 | Discharge: 2011-05-11 | Disposition: A | Discharge status: Home / Self Care | Payer: Medicare Other | Source: Ambulatory Visit | Attending: Radiation Oncology | Admitting: Radiation Oncology

## 2011-05-11 ENCOUNTER — Encounter (HOSPITAL_COMMUNITY): Payer: Medicare Other

## 2011-05-11 DIAGNOSIS — C342 Malignant neoplasm of middle lobe, bronchus or lung: Secondary | ICD-10-CM

## 2011-05-11 NOTE — Progress Notes (Signed)
[facility] Radiation Oncology Simulation and Treatment Planning Note   Name: Heather Barnes MRN: [id number]  Date: 05/11/2011  DOB: 1933/09/24  Status:outpatient    DIAGNOSIS: The encounter diagnosis was Lung cancer, middle lobe.    SIDE: right   CONSENT VERIFIED:yes   SET UP: Patient is setup supine   IMMOBILIZATION: Following immobilization is used:Vac Loc and Abdominal Compression   NARRATIVE:The patient was brought to the CT Simulation planning suite.  Identity was confirmed.  All relevant records and images related to the planned course of therapy were reviewed.  Then, the patient was positioned in a stable reproducible clinical set-up for radiation therapy. Abdominal compression was applied by me.  4D CT images were obtained and reproducible breathing pattern was confirmed. Free breathing CT images were obtained.  Skin markings were placed.  The CT images were loaded into the planning software where the target and avoidance structures were contoured.  The radiation prescription was entered and confirmed.    TREATMENT PLANNING NOTE:  Treatment planning then occurred. I have requested : MLC's, isodose plan, basic dose calculation.  3 dimensional simulation is performed and dose volume histogram of the gross tumor volume, planning tumor volume and criticial normal structures including the spinal cord and lungs were analyzed and requested.  Special treatment procedure was performed due to high dose per fraction.  The patient will be monitored for increased risk of toxicity.  Daily imaging using cone beam CT will be used for target localization.

## 2011-05-11 NOTE — Progress Notes (Signed)
Met with patient to discuss RO billing.  Patient had no concerns today. 

## 2011-05-13 ENCOUNTER — Encounter (HOSPITAL_COMMUNITY): Payer: Medicare Other

## 2011-05-18 ENCOUNTER — Encounter (HOSPITAL_COMMUNITY): Payer: Medicare Other

## 2011-05-20 ENCOUNTER — Encounter (HOSPITAL_COMMUNITY): Payer: Medicare Other

## 2011-05-24 ENCOUNTER — Ambulatory Visit
Admission: RE | Admit: 2011-05-24 | Discharge: 2011-05-24 | Payer: Medicare Other | Source: Ambulatory Visit | Attending: Radiation Oncology | Admitting: Radiation Oncology

## 2011-05-24 ENCOUNTER — Ambulatory Visit
Admission: RE | Admit: 2011-05-24 | Discharge: 2011-05-24 | Disposition: A | Discharge status: Home / Self Care | Payer: Medicare Other | Source: Ambulatory Visit | Attending: Radiation Oncology | Admitting: Radiation Oncology

## 2011-05-24 DIAGNOSIS — C342 Malignant neoplasm of middle lobe, bronchus or lung: Secondary | ICD-10-CM

## 2011-05-24 NOTE — Progress Notes (Signed)
POST SIM TEACHING DONE WITH PAT, VERBALIZES UNDERSTANDING.  BOOKLET, RADIATION AND YOU GIVEN TO PT WITH PERTINENT INFO MARKED.  RADIAPLEX GIVEN WITH INSTRUCTIONS FOR USE.

## 2011-05-25 ENCOUNTER — Encounter (HOSPITAL_COMMUNITY): Payer: Medicare Other

## 2011-05-25 NOTE — Procedures (Signed)
DIAGNOSIS:  Lung cancer.  NARRATIVE:  Heather Barnes presented for her 1st fraction of stereotactic body radiosurgery.  The cone beam CT scan was reviewed from the treatment machine and this was overlaid and compared to the planning scan.  Good alignment was achieved in terms of the tumor location corresponding to the target.  Additionally, the critical normal structures in this region were also reviewed, especially the chest wall and the lungs.  Good alignment was obtained for each of these structures and the patient proceeded with her treatment as planned without any difficulties.    ______________________________ Radene Gunning, M.D., Ph.D. JSM/MEDQ  D:  05/25/2011  T:  05/25/2011  Job:  [id number]

## 2011-05-26 ENCOUNTER — Ambulatory Visit
Admission: RE | Admit: 2011-05-26 | Discharge: 2011-05-26 | Disposition: A | Discharge status: Home / Self Care | Payer: Medicare Other | Source: Ambulatory Visit | Attending: Radiation Oncology | Admitting: Radiation Oncology

## 2011-05-27 ENCOUNTER — Encounter (HOSPITAL_COMMUNITY): Payer: Medicare Other

## 2011-05-28 ENCOUNTER — Ambulatory Visit
Admission: RE | Admit: 2011-05-28 | Discharge: 2011-05-28 | Disposition: A | Discharge status: Home / Self Care | Payer: Medicare Other | Source: Ambulatory Visit | Attending: Radiation Oncology | Admitting: Radiation Oncology

## 2011-05-31 ENCOUNTER — Ambulatory Visit
Admission: RE | Admit: 2011-05-31 | Discharge: 2011-05-31 | Disposition: A | Discharge status: Home / Self Care | Payer: Medicare Other | Source: Ambulatory Visit | Attending: Radiation Oncology | Admitting: Radiation Oncology

## 2011-06-01 ENCOUNTER — Encounter (HOSPITAL_COMMUNITY): Payer: Medicare Other

## 2011-06-02 ENCOUNTER — Encounter: Payer: Self-pay | Admitting: Radiation Oncology

## 2011-06-02 ENCOUNTER — Ambulatory Visit
Admission: RE | Admit: 2011-06-02 | Discharge: 2011-06-02 | Disposition: A | Discharge status: Home / Self Care | Payer: Medicare Other | Source: Ambulatory Visit | Attending: Radiation Oncology | Admitting: Radiation Oncology

## 2011-06-02 DIAGNOSIS — C342 Malignant neoplasm of middle lobe, bronchus or lung: Secondary | ICD-10-CM

## 2011-06-02 NOTE — Progress Notes (Signed)
Patient presents to the clinic today accompanied by her husband for under treat visit with Dr. Michell Heinrich. Patient is alert and oriented to person, place, and time. No distress noted. Steady gait noted. Pleasant affect noted. Patient denies cough. Patient denies pain. Patient reports wheezing on days of damp weather. Patient uses portable oxygen therapy 2 liters via nasal cannula while sleeping and ambulating. Patient reports shortness of breath only with exertion. Provided patient with an appointment card to return in one month for follow up.

## 2011-06-02 NOTE — Progress Notes (Signed)
Weekly Management Note Current Dose: 60 Gy  Projected Dose: 60 Gy   Narrative:  The patient presents for routine under treatment assessment.  CBCT/MVCT images/Port film x-rays were reviewed.  The chart was checked.  Noticed her pulse is slightly elevated for a day or so after treatment. Rib is sore but not like pain she had when it fractured.  Physical Findings:  Unchanged. Alert, awake.  Vitals:  Filed Vitals:   06/02/11 1135  BP: 128/60  Pulse: 95  Resp: 18   Weight:  Wt Readings from Last 3 Encounters:  06/02/11 124 lb 3.2 oz (56.337 kg)  05/05/11 124 lb 9.6 oz (56.518 kg)  04/23/11 125 lb 12.8 oz (57.063 kg)   Lab Results  Component Value Date   WBC 7.5 04/14/2011   HGB 13.7 04/14/2011   HCT 41.5 04/14/2011   MCV 87.0 04/14/2011   PLT 214 04/14/2011   No results found for this basename: CREATININE, BUN, NA, K, CL, CO2     Impression:  The patient is tolerating radiation.  Plan:  F/u 1 month. CT scan at that time. Call with problems or questions.

## 2011-06-03 ENCOUNTER — Encounter (HOSPITAL_COMMUNITY): Admission: RE | Admit: 2011-06-03 | Payer: Medicare Other | Source: Ambulatory Visit

## 2011-06-03 DIAGNOSIS — J4489 Other specified chronic obstructive pulmonary disease: Secondary | ICD-10-CM | POA: Insufficient documentation

## 2011-06-03 DIAGNOSIS — R0989 Other specified symptoms and signs involving the circulatory and respiratory systems: Secondary | ICD-10-CM | POA: Insufficient documentation

## 2011-06-03 DIAGNOSIS — Z5189 Encounter for other specified aftercare: Secondary | ICD-10-CM | POA: Insufficient documentation

## 2011-06-03 DIAGNOSIS — Z9981 Dependence on supplemental oxygen: Secondary | ICD-10-CM | POA: Insufficient documentation

## 2011-06-03 DIAGNOSIS — R0602 Shortness of breath: Secondary | ICD-10-CM | POA: Insufficient documentation

## 2011-06-03 DIAGNOSIS — J449 Chronic obstructive pulmonary disease, unspecified: Secondary | ICD-10-CM | POA: Insufficient documentation

## 2011-06-03 DIAGNOSIS — R0609 Other forms of dyspnea: Secondary | ICD-10-CM | POA: Insufficient documentation

## 2011-06-04 NOTE — Progress Notes (Signed)
CC:   Pam Drown, M.D.  DIAGNOSIS:  T1 N0 right lung cancer.  TREATMENT DATES:  05/24/2011 to 06/02/2011.  ANATOMIC REGION TREATED:  Right middle lobe.  BEAM ARRANGEMENT:  Dynamic conformal arc.  DOSE:  60 Gy at 12 Gy per fraction x5 fractions.  BEAM ENERGY:  6 MV photons.  TREATMENT TOLERANCE: Ms. Toenjes really tolerated her treatment well.  She had no worsening of her breathing symptoms.  FOLLOWUP: I will plan on seeing her back in 1 month's time.  We will perform a CT scan at that time and at every 4-month intervals.  She knows to contact us in the interim with any questions or concerns.    ______________________________ Lurline Hare, M.D. SW/MEDQ  D:  06/03/2011  T:  06/03/2011  Job:  [id number]

## 2011-06-10 ENCOUNTER — Telehealth: Payer: Self-pay | Admitting: *Deleted

## 2011-06-10 ENCOUNTER — Telehealth: Payer: Self-pay | Admitting: Internal Medicine

## 2011-06-10 NOTE — Telephone Encounter (Signed)
I spoke with pt and she states she has been coughing, having runny nose, and increase SOB x 1 day. Pt is scheduled to come in and see MW tomorrow at 9:45 to be evaluated. Pt aware to seek emergency care if she worsens. She voiced her understanding.

## 2011-06-11 ENCOUNTER — Ambulatory Visit (INDEPENDENT_AMBULATORY_CARE_PROVIDER_SITE_OTHER): Payer: Medicare Other | Admitting: Internal Medicine

## 2011-06-11 ENCOUNTER — Encounter: Payer: Self-pay | Admitting: Internal Medicine

## 2011-06-11 VITALS — BP 130/70 | HR 95 | Temp 97.8°F | Ht 60.0 in | Wt 126.0 lb

## 2011-06-11 DIAGNOSIS — J449 Chronic obstructive pulmonary disease, unspecified: Secondary | ICD-10-CM | POA: Diagnosis not present

## 2011-06-11 MED ORDER — PREDNISONE (PAK) 10 MG PO TABS: ORAL_TABLET | ORAL | Status: DC

## 2011-06-11 NOTE — Patient Instructions (Addendum)
Work on inhaler technique:  relax and gently blow all the way out then take a nice smooth deep breath back in, triggering the inhaler at same time you start breathing in.  Hold for up to 5 seconds if you can.  Rinse and gargle with water when done   If your mouth or throat starts to bother you,   I suggest you time the inhaler to your dental care and after using the inhaler(s) brush teeth and tongue with a baking soda containing toothpaste and when you rinse this out, gargle with it first to see if this helps your mouth and throat.      Prednisone 10 mg take  4 each am x 2 days,   2 each am x 2 days,  1 each am x2days and stop   Mucinex dm as needed for cough/ congestion  Pepcid 20 mg one at bedtime as long as coughing and don't take fosfamax  GERD (REFLUX)  is an extremely common cause of respiratory symptoms, many times with no significant heartburn at all.    It can be treated with medication, but also with lifestyle changes including avoidance of late meals, excessive alcohol, smoking cessation, and avoid fatty foods, chocolate, peppermint, colas, red wine, and acidic juices such as orange juice.  NO MINT OR MENTHOL PRODUCTS SO NO COUGH DROPS  USE SUGARLESS CANDY INSTEAD (jolley ranchers or Stover's)  NO OIL BASED VITAMINS - use powdered substitutes.

## 2011-06-11 NOTE — Progress Notes (Signed)
Subjective:    Patient ID: Heather Barnes, female    DOB: [birth year], 78 y.o.   MRN: [id number]  HPI 78 year old ex 20 pack  Smoker quit 2000. REferred for severe dyspnea and pulmonary nodule.  OV 02/17/11: Followup COPD/dyspnea after PFT, pulmonary nodule and chest pain (atypical). Since last visit chest pain resolved. Finds o2and inhalers helps. No new complaints. She is here to discuss pft results and serum oncimmune test results. PFTs show gold stage 3 copd - fev1 0.6L/43%, ratio 44, dlco 5/34%. SErum onciummne test shows 1 antigen positive for lung cancer (highly specific test with high test positive predictive value). Her next ct scan is  Due mid-oct 2012    OV 04/23/2011: Fu after lung bx. She had PET - nodule was hot. No other uptake (discussed at [facility] prior to bx as well). MTOC feeling (see phone note) was not a surgical candidate for lobectomy but possibly for wedge resection with seeds. Bx 04/14/11 - NSCLC. Tolerated bx well. Post bx CXR showed local hge but not ptx. Immediate post bx phase well but last 3 days having early morning mild, small amount of hemoptysis (old dried blood). Not present rest of day. Also, same time  New nasal drainage that is clear. Denies increased, cough, wheeze, edema, chest pain, symcope. Walking sats 185 feet x 3 laps; pulse ox lowest was 90%. Spirometry shows: Fev1 today: 0.58L/33%; and almost gold stage 4 copd. CXR c/w  post biopsy changes and looks fairly clear. rec  06/11/2011 f/u ov/Pharrell Ledford cc increased sob and need for saba over baseline assoc with nasal congestion and hacking cough, white mucus, using lots of cough drops and on fosfamax q Saturday. No overt hb, no sinus pain or purulent drainage  Sleeping ok without nocturnal  or early am exacerbation  of respiratory  c/o's or need for noct saba. Also denies any obvious fluctuation of symptoms with weather or environmental changes or other aggravating or alleviating factors except as outlined above   ROS   At present neg for  any significant sore throat, dysphagia, itching, sneezing,  nasal congestion or excess/ purulent secretions,  fever, chills, sweats, unintended wt loss, pleuritic or exertional cp, hempoptysis, orthopnea pnd or leg swelling.  Also denies presyncope, palpitations, heartburn, abdominal pain, nausea, vomiting, diarrhea  or change in bowel or urinary habits, dysuria,hematuria,  rash, arthralgias, visual complaints, headache, numbness weakness or ataxia.       .        Objective:   Physical Exam Vitals reviewed. Constitutional: She is oriented to person, place, and time. She appears well-developed and well-nourished. No distress.  HENT:  Head: Normocephalic and atraumatic.  Right Ear: External ear normal.  Left Ear: External ear normal.  Mouth/Throat: Oropharynx is clear and moist. No oropharyngeal exudate.  Eyes: Conjunctivae and EOM are normal. Pupils are equal, round, and reactive to light. Right eye exhibits no discharge. Left eye exhibits no discharge. No scleral icterus.  Neck: Normal range of motion. Neck supple. No JVD present. No tracheal deviation present. No thyromegaly present.  Cardiovascular: Normal rate, regular rhythm, normal heart sounds and intact distal pulses.  Exam reveals no gallop and no friction rub.   No murmur heard. Pulmonary/Chest: Effort normal and breath sounds normal. No respiratory distress. She has no wheezes. She has no rales. She exhibits no tenderness.       barrell chest marked increased exp time with mid exp wheeze better with Purse lip maneuver   Abdominal: Soft. Bowel sounds  are normal. She exhibits no distension and no mass. There is no tenderness. There is no rebound and no guarding.  Musculoskeletal: Normal range of motion. She exhibits no edema and no tenderness.  Lymphadenopathy:    She has no cervical adenopathy.  Neurological: She is alert and oriented to person, place, and time. She has normal reflexes. No cranial nerve  deficit. She exhibits normal muscle tone. Coordination normal.  Skin: Skin is warm and dry. No rash noted. She is not diaphoretic. No erythema. No pallor.  Psychiatric: She has a normal mood and affect. Her behavior is normal. Judgment and thought content normal.           Assessment & Plan:

## 2011-06-13 NOTE — Assessment & Plan Note (Signed)
DDX of  difficult airways managment all start with A and  include Adherence, Ace Inhibitors, Acid Reflux, Active Sinus Disease, Alpha 1 Antitripsin deficiency, Anxiety masquerading as Airways dz,  ABPA,  allergy(esp in young), Aspiration (esp in elderly), Adverse effects of DPI,  Active smokers, plus two Bs  = Bronchiectasis and Beta blocker use..and one C= CHF   Adherence is always the initial "prime suspect" and is a multilayered concern that requires a "trust but verify" approach in every patient - starting with knowing how to use medications, especially inhalers, correctly, keeping up with refills and understanding the fundamental difference between maintenance and prns vs those medications only taken for a very short course and then stopped and not refilled.  The proper method of use, as well as anticipated side effects, of this metered-dose inhaler are discussed and demonstrated to the patient. Improved to 75% only after extensive training  ? Acid reflux > try off fosmax and rx gerd esp during an exac that is not clearly related to uri/ tracheobronchitis    Each maintenance medication was reviewed in detail including most importantly the difference between maintenance and as needed and under what circumstances the prns are to be used.  Please see instructions for details which were reviewed in writing and the patient given a copy.  See instructions for specific recommendations which were reviewed directly with the patient who was given a copy with highlighter outlining the key components.

## 2011-06-18 ENCOUNTER — Telehealth: Payer: Self-pay | Admitting: Internal Medicine

## 2011-06-18 MED ORDER — DOXYCYCLINE HYCLATE 100 MG PO TABS: 100.0000 mg | ORAL_TABLET | Freq: Two times a day (BID) | ORAL | Status: AC

## 2011-06-18 MED ORDER — PREDNISONE (PAK) 10 MG PO TABS: 10.0000 mg | ORAL_TABLET | Freq: Every day | ORAL | Status: AC

## 2011-06-18 NOTE — Telephone Encounter (Signed)
I think she has AECOPD. Needs to go back on prednisone and start abx  Take doxycycline 100mg  po twice daily x 5 days; take after meals and avoid sunlight  Please take Take prednisone 40mg  once daily x 3 days, then 30mg  once daily x 3 days, then 20mg  once daily x 3 days, then prednisone 10mg  once daily  x 3 days and stop -12 days start fresh from today.   Make sure she is not on chronic pred  If worse, go to [facility]

## 2011-06-18 NOTE — Telephone Encounter (Signed)
Called and spoke with pt and she is aware of MR recs and that these meds have been sent to her pharmacy.  She is aware to follow up in the ER if she gets worse.  Pt voiced her understanding of this.

## 2011-06-18 NOTE — Telephone Encounter (Signed)
I spoke with pt and she c/o cough w/ thick yellow to green phlem, blowing out clear phlem w/ streaks of blood in it, wheezing, sinus pressure, sinus headache. Pt denies any chills, sweats, fever, nausea, vomiting, body aches. Pt was in and saw MW on 06/11/11 and was given a pred taper. Pt states she is still taking mucinex  DM BID. Pt is requesting recs from MR. She is aware he will be here this afternoon and was fine with that. Please advise Dr. Marchelle Gearing, thanks  Allergies  Allergen Reactions  . Codeine Other (See Comments)    Hyperactivity,crying  . Spiriva Handihaler     rash

## 2011-06-18 NOTE — Telephone Encounter (Signed)
Pt was checking on status of her message.  Please advise.  Antionette Fairy

## 2011-06-21 ENCOUNTER — Other Ambulatory Visit: Payer: Self-pay | Admitting: Internal Medicine

## 2011-06-30 ENCOUNTER — Telehealth (HOSPITAL_COMMUNITY): Payer: Self-pay | Admitting: *Deleted

## 2011-07-01 ENCOUNTER — Telehealth: Payer: Self-pay | Admitting: *Deleted

## 2011-07-01 ENCOUNTER — Encounter: Payer: Self-pay | Admitting: Radiation Oncology

## 2011-07-01 ENCOUNTER — Ambulatory Visit
Admission: RE | Admit: 2011-07-01 | Discharge: 2011-07-01 | Disposition: A | Discharge status: Home / Self Care | Payer: Medicare Other | Source: Ambulatory Visit | Attending: Radiation Oncology | Admitting: Radiation Oncology

## 2011-07-01 DIAGNOSIS — C342 Malignant neoplasm of middle lobe, bronchus or lung: Secondary | ICD-10-CM

## 2011-07-01 NOTE — Progress Notes (Signed)
CC:   Kalman Shan, MD  DIAGNOSIS:  T1 N0 non-small cell lung cancer of the right middle lobe.  PREVIOUS RADIATION:  60 Gy completed 06/02/2011.  INTERVAL SINCE TREATMENT:  1 month ago.  HISTORY:  Ms. Benegas reports for followup today.  She states after treatment she had some cough and congestion, and was treated with prednisone and a round of antibiotics for bronchitis.  She is feeling better.  She is ready to return to Pulmonary Rehab.  She really has done well since then.  She brings her portable oxygen tank with her but is not currently using it.  PHYSICAL EXAMINATION:  She is a pleasant female in no distress sitting comfortably on the exam room table.  Her weight is 124 pounds.  She has no palpable pain over her right chest wall and is not wearing her oxygen.  IMPRESSION:  Stage I lung cancer status post stereotactic body radiation therapy  with no acute effects of treatment.  RECOMMENDATIONS:  I have ordered a CT scan for Ms. Hayashi in a few weeks and she is going to follow up with Dr. Marchelle Gearing at that time.  I will then plan on scanning her again in August and Dr. Marchelle Gearing and I can alternate with every 7-month follow up with her.  I did educate her that it is possible she could have a rib fracture secondary to her osteoporosis and the radiation treatment.  She understands that this may happen and knows to call us if she has any concerns.    ______________________________ Lurline Hare, M.D. SW/MEDQ  D:  07/01/2011  T:  07/01/2011  Job:  [id number]

## 2011-07-01 NOTE — Progress Notes (Signed)
HERE TODAY FOR FU OF LUNG CA.  HAS HAD BRONCHITIS SINCE WAS HERE, TREATED WITH PREDNISONE X 2 AND ANTIBIOTICS BY PULMONARY DOCTOR, DR. RAMASWAMMEY.  O2 SAT 96%

## 2011-07-09 ENCOUNTER — Ambulatory Visit
Admission: RE | Admit: 2011-07-09 | Discharge: 2011-07-09 | Disposition: A | Discharge status: Home / Self Care | Payer: Medicare Other | Source: Ambulatory Visit | Attending: Radiation Oncology | Admitting: Radiation Oncology

## 2011-07-09 DIAGNOSIS — C342 Malignant neoplasm of middle lobe, bronchus or lung: Secondary | ICD-10-CM | POA: Insufficient documentation

## 2011-07-09 DIAGNOSIS — C792 Secondary malignant neoplasm of skin: Secondary | ICD-10-CM | POA: Diagnosis not present

## 2011-07-12 ENCOUNTER — Ambulatory Visit (HOSPITAL_COMMUNITY)
Admission: RE | Admit: 2011-07-12 | Discharge: 2011-07-12 | Disposition: A | Discharge status: Home / Self Care | Payer: Medicare Other | Source: Ambulatory Visit | Attending: Radiation Oncology | Admitting: Radiation Oncology

## 2011-07-12 DIAGNOSIS — E041 Nontoxic single thyroid nodule: Secondary | ICD-10-CM | POA: Diagnosis not present

## 2011-07-12 DIAGNOSIS — K7689 Other specified diseases of liver: Secondary | ICD-10-CM | POA: Insufficient documentation

## 2011-07-12 DIAGNOSIS — I2510 Atherosclerotic heart disease of native coronary artery without angina pectoris: Secondary | ICD-10-CM | POA: Diagnosis not present

## 2011-07-12 DIAGNOSIS — C342 Malignant neoplasm of middle lobe, bronchus or lung: Secondary | ICD-10-CM | POA: Diagnosis not present

## 2011-07-12 DIAGNOSIS — N281 Cyst of kidney, acquired: Secondary | ICD-10-CM | POA: Diagnosis not present

## 2011-07-12 DIAGNOSIS — Z923 Personal history of irradiation: Secondary | ICD-10-CM | POA: Diagnosis not present

## 2011-07-12 DIAGNOSIS — R222 Localized swelling, mass and lump, trunk: Secondary | ICD-10-CM | POA: Diagnosis not present

## 2011-07-12 MED ORDER — IOHEXOL 300 MG/ML  SOLN
100.0000 mL | Freq: Once | INTRAMUSCULAR | Status: AC | PRN
  Administered 2011-07-12: 80 mL via INTRAVENOUS

## 2011-07-13 ENCOUNTER — Encounter (HOSPITAL_COMMUNITY): Admission: RE | Admit: 2011-07-13 | Payer: Medicare Other | Source: Ambulatory Visit

## 2011-07-15 ENCOUNTER — Encounter (HOSPITAL_COMMUNITY): Payer: Medicare Other

## 2011-07-20 ENCOUNTER — Encounter (HOSPITAL_COMMUNITY): Payer: Medicare Other

## 2011-07-22 ENCOUNTER — Encounter: Payer: Self-pay | Admitting: Internal Medicine

## 2011-07-22 ENCOUNTER — Ambulatory Visit (INDEPENDENT_AMBULATORY_CARE_PROVIDER_SITE_OTHER): Payer: Medicare Other | Admitting: Internal Medicine

## 2011-07-22 ENCOUNTER — Encounter (HOSPITAL_COMMUNITY): Payer: Medicare Other

## 2011-07-22 VITALS — BP 120/70 | HR 90 | Temp 98.6°F | Ht 60.0 in | Wt 124.4 lb

## 2011-07-22 DIAGNOSIS — R5383 Other fatigue: Secondary | ICD-10-CM | POA: Diagnosis not present

## 2011-07-22 DIAGNOSIS — J449 Chronic obstructive pulmonary disease, unspecified: Secondary | ICD-10-CM | POA: Diagnosis not present

## 2011-07-22 DIAGNOSIS — B370 Candidal stomatitis: Secondary | ICD-10-CM | POA: Diagnosis not present

## 2011-07-22 DIAGNOSIS — R5381 Other malaise: Secondary | ICD-10-CM

## 2011-07-22 DIAGNOSIS — C342 Malignant neoplasm of middle lobe, bronchus or lung: Secondary | ICD-10-CM

## 2011-07-22 DIAGNOSIS — J4489 Other specified chronic obstructive pulmonary disease: Secondary | ICD-10-CM

## 2011-07-22 MED ORDER — FLUCONAZOLE 100 MG PO TABS: ORAL_TABLET | ORAL | Status: DC

## 2011-07-22 NOTE — Assessment & Plan Note (Signed)
RML lateral segment 8-20mm nodule subpleural - new since 2008 as of July 2012.  S/p bx nov 2012: NSCLC. PET c/w STage 1. Consensus is stage 1A at [facility]. S/p XRT. FU CT 07/22/11 shows improvement. Heather Barnes will fu with Dr Michell Heinrich in August 2013

## 2011-07-22 NOTE — Assessment & Plan Note (Signed)
Given hoarse voice will do diflucan daily x 5 days instead of nystatin swish and swallow

## 2011-07-22 NOTE — Patient Instructions (Addendum)
#  COPD and shortness of breath and fatigue  - copd is stable - fatigue is due to  copd and recent cancer v cancer treatment  - nurse will re-refer you to rehab - continue inhalers as before and oxygen with exertion and sleep - if you develop chest pain let us know -at folllowup will check TSH  #thrush  - there is yeast in your mouth and your voice is hoarse which means it could be in your throat too - take diflucan tablet 100mg  daily x 5 days  -- rinse your mouth after inhaler use  - if it comes back, we have to consider some other inhaler  #Cancer   - this is improved on CT scan 07/12/11.  - I will let Dr Michell Heinrich know who plans to see you in august  - please chck with Dr Michell Heinrich if ok to have mammogram  #Followup - 3 months with spirometry at followup - come sooner if there are problems

## 2011-07-22 NOTE — Progress Notes (Signed)
Subjective:    Patient ID: Heather Barnes, female    DOB: [birth year], 78 y.o.   MRN: [id number]  HPI 78 year old ex 20 pack  Smoker quit 2000. REferred for severe dyspnea and pulmonary nodule.  OV 02/17/11: Followup COPD/dyspnea after PFT, pulmonary nodule and chest pain (atypical). Since last visit chest pain resolved. Finds o2and inhalers helps. No new complaints. She is here to discuss pft results and serum oncimmune test results. PFTs show gold stage 3 copd - fev1 0.6L/43%, ratio 44, dlco 5/34%. SErum onciummne test shows 1 antigen positive for lung cancer (highly specific test with high test positive predictive value). Her next ct scan is  Due mid-oct 2012    OV 04/23/2011: Fu after lung bx. She had PET - nodule was hot. No other uptake (discussed at [facility] prior to bx as well). MTOC feeling (see phone note) was not a surgical candidate for lobectomy but possibly for wedge resection with seeds. Bx 04/14/11 - NSCLC. Tolerated bx well. Post bx CXR showed local hge but not ptx. Immediate post bx phase well but last 3 days having early morning mild, small amount of hemoptysis (old dried blood). Not present rest of day. Also, same time  New nasal drainage that is clear. Denies increased, cough, wheeze, edema, chest pain, symcope. Walking sats 185 feet x 3 laps; pulse ox lowest was 90%. Spirometry shows: Fev1 today: 0.58L/33%; and almost gold stage 4 copd. CXR c/w  post biopsy changes and looks fairly clear. rec  06/11/2011 f/u ov/Wert cc increased sob and need for saba over baseline assoc with nasal congestion and hacking cough, white mucus, using lots of cough drops and on fosfamax q Saturday. No overt hb, no sinus pain or purulent drainage  Sleeping ok without nocturnal  or early am exacerbation  of respiratory  c/o's or need for noct saba. Also denies any obvious fluctuation of symptoms with weather or environmental changes or other aggravating or alleviating factors except as outlined above   ROS   At present neg for  any significant sore throat, dysphagia, itching, sneezing,  nasal congestion or excess/ purulent secretions,  fever, chills, sweats, unintended wt loss, pleuritic or exertional cp, hempoptysis, orthopnea pnd or leg swelling.  Also denies presyncope, palpitations, heartburn, abdominal pain, nausea, vomiting, diarrhea  or change in bowel or urinary habits, dysuria,hematuria,  rash, arthralgias, visual complaints, headache, numbness weakness or ataxia.     OV 07/22/2011  Followup  Gold stage 3-4 COPD and Rt lung nodule - NSCLC, s.p curative XRT by Dr Michell Heinrich  Overall well and stable. Has recovered from AECOPD and back to baseline. Says only main issue is that fatigue with exeriton of 20-30 feet and this is asosciated with desaturation and tachycardia (hr 120s).  Lot of questions on expectations. Wants to go back to rehab. Today walking 185 feet x 3 laps: resting HR 91 with pulse ox 92%. AT end of 3 laps HR 112 with lowest pulse ox 86%. Denies chest pain. Recollects normal stress test years ago.   No results found for this basename: TSH, T3TOTAL, T4TOTAL, THYROIDAB   Lab Results  Component Value Date   CREATININE 0.69 07/09/2011    Lab Results  Component Value Date   HGB 13.7 04/14/2011     Review of Systems  Constitutional: Negative for fever and unexpected weight change.  HENT: Negative for ear pain, nosebleeds, congestion, sore throat, rhinorrhea, sneezing, trouble swallowing, dental problem, postnasal drip and sinus pressure.   Eyes: Negative for  redness and itching.  Respiratory: Negative for cough, chest tightness, shortness of breath and wheezing.   Cardiovascular: Negative for palpitations and leg swelling.  Gastrointestinal: Negative for nausea and vomiting.  Genitourinary: Negative for dysuria.  Musculoskeletal: Negative for joint swelling.  Skin: Negative for rash.  Neurological: Negative for headaches.  Hematological: Does not bruise/bleed easily.    Psychiatric/Behavioral: Negative for dysphoric mood. The patient is not nervous/anxious.        Objective:   Physical Exam Vitals reviewed. Constitutional: She is oriented to person, place, and time. She appears well-developed and well-nourished. No distress.  HENT:  Head: Normocephalic and atraumatic.  Right Ear: External ear normal.  Left Ear: Blocked by WAX Mouth/Throat: Oropharynx is clear and moist.  THRUSH + and VOICE HOARSE +  Eyes: Conjunctivae and EOM are normal. Pupils are equal, round, and reactive to light. Right eye exhibits no discharge. Left eye exhibits no discharge. No scleral icterus.  Neck: Normal range of motion. Neck supple. No JVD present. No tracheal deviation present. No thyromegaly present.  Cardiovascular: Normal rate, regular rhythm, normal heart sounds and intact distal pulses.  Exam reveals no gallop and no friction rub.   No murmur heard. Pulmonary/Chest: Effort normal and breath sounds normal. No respiratory distress. She has no wheezes. She has no rales. She exhibits no tenderness.       barrell chest expiraiton prolonged. No wheeze. No purse lip breathing  Abdominal: Soft. Bowel sounds are normal. She exhibits no distension and no mass. There is no tenderness. There is no rebound and no guarding.  Musculoskeletal: Normal range of motion. She exhibits no edema and no tenderness.  Lymphadenopathy:    She has no cervical adenopathy.  Neurological: She is alert and oriented to person, place, and time. She has normal reflexes. No cranial nerve deficit. She exhibits normal muscle tone. Coordination normal.  Skin: Skin is warm and dry. No rash noted. She is not diaphoretic. No erythema. No pallor.  Psychiatric: She has a normal mood and affect. Her behavior is normal. Judgment and thought content normal.            Assessment & Plan:

## 2011-07-22 NOTE — Assessment & Plan Note (Signed)
#  COPD and shortness of breath and fatigue  - copd is stable - fatigue is due to  copd and recent cancer v cancer treatment  - nurse will re-refer you to rehab - continue inhalers as before and oxygen with exertion and sleep - if you develop chest pain let us know -at folllowup will check TSH  

## 2011-07-22 NOTE — Assessment & Plan Note (Signed)
#  COPD and shortness of breath and fatigue  - copd is stable - fatigue is due to  copd and recent cancer v cancer treatment  - nurse will re-refer you to rehab - continue inhalers as before and oxygen with exertion and sleep - if you develop chest pain let us know -at folllowup will check TSH

## 2011-07-26 DIAGNOSIS — H6120 Impacted cerumen, unspecified ear: Secondary | ICD-10-CM | POA: Diagnosis not present

## 2011-07-26 DIAGNOSIS — H9190 Unspecified hearing loss, unspecified ear: Secondary | ICD-10-CM | POA: Diagnosis not present

## 2011-07-26 DIAGNOSIS — H6500 Acute serous otitis media, unspecified ear: Secondary | ICD-10-CM | POA: Diagnosis not present

## 2011-07-27 ENCOUNTER — Encounter (HOSPITAL_COMMUNITY)
Admission: RE | Admit: 2011-07-27 | Discharge: 2011-07-27 | Disposition: A | Discharge status: Home / Self Care | Payer: Medicare Other | Source: Ambulatory Visit | Attending: Internal Medicine | Admitting: Internal Medicine

## 2011-07-27 DIAGNOSIS — R0989 Other specified symptoms and signs involving the circulatory and respiratory systems: Secondary | ICD-10-CM | POA: Insufficient documentation

## 2011-07-27 DIAGNOSIS — J449 Chronic obstructive pulmonary disease, unspecified: Secondary | ICD-10-CM | POA: Insufficient documentation

## 2011-07-27 DIAGNOSIS — Z5189 Encounter for other specified aftercare: Secondary | ICD-10-CM | POA: Insufficient documentation

## 2011-07-27 DIAGNOSIS — R0609 Other forms of dyspnea: Secondary | ICD-10-CM | POA: Insufficient documentation

## 2011-07-27 DIAGNOSIS — Z9981 Dependence on supplemental oxygen: Secondary | ICD-10-CM | POA: Insufficient documentation

## 2011-07-27 DIAGNOSIS — R0602 Shortness of breath: Secondary | ICD-10-CM | POA: Insufficient documentation

## 2011-07-27 DIAGNOSIS — J4489 Other specified chronic obstructive pulmonary disease: Secondary | ICD-10-CM | POA: Insufficient documentation

## 2011-07-27 NOTE — Progress Notes (Signed)
First day of exercise in Pulm Rehab. Orientation to equipment use and safety, RPE and Dyspnea scale, rest breaks. Demonstration and practice of PLB At each exercise station. Tolerated exercise well. VSS.  

## 2011-07-29 ENCOUNTER — Encounter (HOSPITAL_COMMUNITY)
Admission: RE | Admit: 2011-07-29 | Discharge: 2011-07-29 | Disposition: A | Discharge status: Home / Self Care | Payer: Medicare Other | Source: Ambulatory Visit | Attending: Internal Medicine | Admitting: Internal Medicine

## 2011-07-30 NOTE — Telephone Encounter (Signed)
xxx

## 2011-08-03 ENCOUNTER — Encounter (HOSPITAL_COMMUNITY): Payer: Medicare Other

## 2011-08-05 ENCOUNTER — Encounter (HOSPITAL_COMMUNITY)
Admission: RE | Admit: 2011-08-05 | Discharge: 2011-08-05 | Disposition: A | Discharge status: Home / Self Care | Payer: Medicare Other | Source: Ambulatory Visit | Attending: Internal Medicine | Admitting: Internal Medicine

## 2011-08-05 DIAGNOSIS — J449 Chronic obstructive pulmonary disease, unspecified: Secondary | ICD-10-CM | POA: Diagnosis not present

## 2011-08-05 DIAGNOSIS — Z9981 Dependence on supplemental oxygen: Secondary | ICD-10-CM | POA: Diagnosis not present

## 2011-08-05 DIAGNOSIS — R0609 Other forms of dyspnea: Secondary | ICD-10-CM | POA: Diagnosis not present

## 2011-08-05 DIAGNOSIS — R0602 Shortness of breath: Secondary | ICD-10-CM | POA: Diagnosis not present

## 2011-08-05 DIAGNOSIS — Z5189 Encounter for other specified aftercare: Secondary | ICD-10-CM | POA: Diagnosis not present

## 2011-08-10 ENCOUNTER — Encounter (HOSPITAL_COMMUNITY)
Admission: RE | Admit: 2011-08-10 | Discharge: 2011-08-10 | Disposition: A | Discharge status: Home / Self Care | Payer: Medicare Other | Source: Ambulatory Visit | Attending: Internal Medicine | Admitting: Internal Medicine

## 2011-08-10 DIAGNOSIS — J449 Chronic obstructive pulmonary disease, unspecified: Secondary | ICD-10-CM | POA: Diagnosis not present

## 2011-08-10 DIAGNOSIS — J4489 Other specified chronic obstructive pulmonary disease: Secondary | ICD-10-CM | POA: Insufficient documentation

## 2011-08-10 DIAGNOSIS — Z5189 Encounter for other specified aftercare: Secondary | ICD-10-CM | POA: Diagnosis not present

## 2011-08-10 DIAGNOSIS — R0989 Other specified symptoms and signs involving the circulatory and respiratory systems: Secondary | ICD-10-CM | POA: Insufficient documentation

## 2011-08-10 DIAGNOSIS — R0609 Other forms of dyspnea: Secondary | ICD-10-CM | POA: Diagnosis not present

## 2011-08-10 DIAGNOSIS — R0602 Shortness of breath: Secondary | ICD-10-CM | POA: Insufficient documentation

## 2011-08-10 DIAGNOSIS — Z9981 Dependence on supplemental oxygen: Secondary | ICD-10-CM | POA: Insufficient documentation

## 2011-08-11 ENCOUNTER — Other Ambulatory Visit: Payer: Self-pay | Admitting: Family Medicine

## 2011-08-11 DIAGNOSIS — E041 Nontoxic single thyroid nodule: Secondary | ICD-10-CM

## 2011-08-12 ENCOUNTER — Encounter (HOSPITAL_COMMUNITY)
Admission: RE | Admit: 2011-08-12 | Discharge: 2011-08-12 | Disposition: A | Discharge status: Home / Self Care | Payer: Medicare Other | Source: Ambulatory Visit | Attending: Internal Medicine | Admitting: Internal Medicine

## 2011-08-16 ENCOUNTER — Ambulatory Visit
Admission: RE | Admit: 2011-08-16 | Discharge: 2011-08-16 | Disposition: A | Discharge status: Home / Self Care | Payer: Medicare Other | Source: Ambulatory Visit | Attending: Family Medicine | Admitting: Family Medicine

## 2011-08-16 DIAGNOSIS — K219 Gastro-esophageal reflux disease without esophagitis: Secondary | ICD-10-CM | POA: Diagnosis not present

## 2011-08-16 DIAGNOSIS — J309 Allergic rhinitis, unspecified: Secondary | ICD-10-CM | POA: Diagnosis not present

## 2011-08-16 DIAGNOSIS — Z23 Encounter for immunization: Secondary | ICD-10-CM | POA: Diagnosis not present

## 2011-08-16 DIAGNOSIS — J45909 Unspecified asthma, uncomplicated: Secondary | ICD-10-CM | POA: Diagnosis not present

## 2011-08-16 DIAGNOSIS — E042 Nontoxic multinodular goiter: Secondary | ICD-10-CM | POA: Diagnosis not present

## 2011-08-16 DIAGNOSIS — E559 Vitamin D deficiency, unspecified: Secondary | ICD-10-CM | POA: Diagnosis not present

## 2011-08-16 DIAGNOSIS — E049 Nontoxic goiter, unspecified: Secondary | ICD-10-CM | POA: Diagnosis not present

## 2011-08-16 DIAGNOSIS — E041 Nontoxic single thyroid nodule: Secondary | ICD-10-CM

## 2011-08-16 DIAGNOSIS — E782 Mixed hyperlipidemia: Secondary | ICD-10-CM | POA: Diagnosis not present

## 2011-08-16 DIAGNOSIS — M810 Age-related osteoporosis without current pathological fracture: Secondary | ICD-10-CM | POA: Diagnosis not present

## 2011-08-17 ENCOUNTER — Encounter (HOSPITAL_COMMUNITY)
Admission: RE | Admit: 2011-08-17 | Discharge: 2011-08-17 | Disposition: A | Discharge status: Home / Self Care | Payer: Medicare Other | Source: Ambulatory Visit | Attending: Internal Medicine | Admitting: Internal Medicine

## 2011-08-19 ENCOUNTER — Encounter (HOSPITAL_COMMUNITY)
Admission: RE | Admit: 2011-08-19 | Discharge: 2011-08-19 | Disposition: A | Discharge status: Home / Self Care | Payer: Medicare Other | Source: Ambulatory Visit | Attending: Internal Medicine | Admitting: Internal Medicine

## 2011-08-23 DIAGNOSIS — J309 Allergic rhinitis, unspecified: Secondary | ICD-10-CM | POA: Diagnosis not present

## 2011-08-23 DIAGNOSIS — J449 Chronic obstructive pulmonary disease, unspecified: Secondary | ICD-10-CM | POA: Diagnosis not present

## 2011-08-23 DIAGNOSIS — E049 Nontoxic goiter, unspecified: Secondary | ICD-10-CM | POA: Diagnosis not present

## 2011-08-23 DIAGNOSIS — Z85118 Personal history of other malignant neoplasm of bronchus and lung: Secondary | ICD-10-CM | POA: Diagnosis not present

## 2011-08-23 DIAGNOSIS — M810 Age-related osteoporosis without current pathological fracture: Secondary | ICD-10-CM | POA: Diagnosis not present

## 2011-08-23 DIAGNOSIS — E782 Mixed hyperlipidemia: Secondary | ICD-10-CM | POA: Diagnosis not present

## 2011-08-24 ENCOUNTER — Encounter (HOSPITAL_COMMUNITY)
Admission: RE | Admit: 2011-08-24 | Discharge: 2011-08-24 | Disposition: A | Discharge status: Home / Self Care | Payer: Medicare Other | Source: Ambulatory Visit | Attending: Internal Medicine | Admitting: Internal Medicine

## 2011-08-26 ENCOUNTER — Encounter (HOSPITAL_COMMUNITY)
Admission: RE | Admit: 2011-08-26 | Discharge: 2011-08-26 | Disposition: A | Discharge status: Home / Self Care | Payer: Medicare Other | Source: Ambulatory Visit | Attending: Internal Medicine | Admitting: Internal Medicine

## 2011-08-31 ENCOUNTER — Encounter (HOSPITAL_COMMUNITY)
Admission: RE | Admit: 2011-08-31 | Discharge: 2011-08-31 | Disposition: A | Discharge status: Home / Self Care | Payer: Medicare Other | Source: Ambulatory Visit | Attending: Internal Medicine | Admitting: Internal Medicine

## 2011-09-02 ENCOUNTER — Encounter (HOSPITAL_COMMUNITY)
Admission: RE | Admit: 2011-09-02 | Discharge: 2011-09-02 | Disposition: A | Discharge status: Home / Self Care | Payer: Medicare Other | Source: Ambulatory Visit | Attending: Internal Medicine | Admitting: Internal Medicine

## 2011-09-06 DIAGNOSIS — Z1231 Encounter for screening mammogram for malignant neoplasm of breast: Secondary | ICD-10-CM | POA: Diagnosis not present

## 2011-09-06 DIAGNOSIS — Z803 Family history of malignant neoplasm of breast: Secondary | ICD-10-CM | POA: Diagnosis not present

## 2011-09-07 ENCOUNTER — Encounter (HOSPITAL_COMMUNITY)
Admission: RE | Admit: 2011-09-07 | Discharge: 2011-09-07 | Disposition: A | Discharge status: Home / Self Care | Payer: Medicare Other | Source: Ambulatory Visit | Attending: Internal Medicine | Admitting: Internal Medicine

## 2011-09-07 DIAGNOSIS — J449 Chronic obstructive pulmonary disease, unspecified: Secondary | ICD-10-CM | POA: Insufficient documentation

## 2011-09-07 DIAGNOSIS — Z9981 Dependence on supplemental oxygen: Secondary | ICD-10-CM | POA: Insufficient documentation

## 2011-09-07 DIAGNOSIS — Z5189 Encounter for other specified aftercare: Secondary | ICD-10-CM | POA: Diagnosis not present

## 2011-09-07 DIAGNOSIS — R0609 Other forms of dyspnea: Secondary | ICD-10-CM | POA: Insufficient documentation

## 2011-09-07 DIAGNOSIS — R0989 Other specified symptoms and signs involving the circulatory and respiratory systems: Secondary | ICD-10-CM | POA: Insufficient documentation

## 2011-09-07 DIAGNOSIS — R0602 Shortness of breath: Secondary | ICD-10-CM | POA: Insufficient documentation

## 2011-09-07 DIAGNOSIS — J4489 Other specified chronic obstructive pulmonary disease: Secondary | ICD-10-CM | POA: Insufficient documentation

## 2011-09-09 ENCOUNTER — Encounter (HOSPITAL_COMMUNITY)
Admission: RE | Admit: 2011-09-09 | Discharge: 2011-09-09 | Disposition: A | Discharge status: Home / Self Care | Payer: Medicare Other | Source: Ambulatory Visit | Attending: Internal Medicine | Admitting: Internal Medicine

## 2011-09-14 ENCOUNTER — Encounter (HOSPITAL_COMMUNITY)
Admission: RE | Admit: 2011-09-14 | Discharge: 2011-09-14 | Disposition: A | Discharge status: Home / Self Care | Payer: Medicare Other | Source: Ambulatory Visit | Attending: Internal Medicine | Admitting: Internal Medicine

## 2011-09-16 ENCOUNTER — Encounter (HOSPITAL_COMMUNITY)
Admission: RE | Admit: 2011-09-16 | Discharge: 2011-09-16 | Disposition: A | Discharge status: Home / Self Care | Payer: Medicare Other | Source: Ambulatory Visit | Attending: Internal Medicine | Admitting: Internal Medicine

## 2011-09-21 ENCOUNTER — Encounter (HOSPITAL_COMMUNITY)
Admission: RE | Admit: 2011-09-21 | Discharge: 2011-09-21 | Disposition: A | Discharge status: Home / Self Care | Payer: Medicare Other | Source: Ambulatory Visit | Attending: Internal Medicine | Admitting: Internal Medicine

## 2011-09-23 ENCOUNTER — Encounter (HOSPITAL_COMMUNITY)
Admission: RE | Admit: 2011-09-23 | Discharge: 2011-09-23 | Disposition: A | Discharge status: Home / Self Care | Payer: Medicare Other | Source: Ambulatory Visit | Attending: Internal Medicine | Admitting: Internal Medicine

## 2011-09-23 ENCOUNTER — Ambulatory Visit: Payer: Medicare Other | Admitting: Radiation Oncology

## 2011-09-28 ENCOUNTER — Encounter (HOSPITAL_COMMUNITY)
Admission: RE | Admit: 2011-09-28 | Discharge: 2011-09-28 | Disposition: A | Discharge status: Home / Self Care | Payer: Medicare Other | Source: Ambulatory Visit | Attending: Internal Medicine | Admitting: Internal Medicine

## 2011-09-30 ENCOUNTER — Encounter (HOSPITAL_COMMUNITY)
Admission: RE | Admit: 2011-09-30 | Discharge: 2011-09-30 | Disposition: A | Discharge status: Home / Self Care | Payer: Medicare Other | Source: Ambulatory Visit | Attending: Internal Medicine | Admitting: Internal Medicine

## 2011-10-05 ENCOUNTER — Encounter (HOSPITAL_COMMUNITY)
Admission: RE | Admit: 2011-10-05 | Discharge: 2011-10-05 | Disposition: A | Discharge status: Home / Self Care | Payer: Medicare Other | Source: Ambulatory Visit | Attending: Internal Medicine | Admitting: Internal Medicine

## 2011-10-07 ENCOUNTER — Encounter (HOSPITAL_COMMUNITY)
Admission: RE | Admit: 2011-10-07 | Discharge: 2011-10-07 | Disposition: A | Discharge status: Home / Self Care | Payer: Medicare Other | Source: Ambulatory Visit | Attending: Internal Medicine | Admitting: Internal Medicine

## 2011-10-07 ENCOUNTER — Encounter (HOSPITAL_COMMUNITY): Payer: Medicare Other

## 2011-10-07 DIAGNOSIS — Z9981 Dependence on supplemental oxygen: Secondary | ICD-10-CM | POA: Insufficient documentation

## 2011-10-07 DIAGNOSIS — Z5189 Encounter for other specified aftercare: Secondary | ICD-10-CM | POA: Diagnosis not present

## 2011-10-07 DIAGNOSIS — J4489 Other specified chronic obstructive pulmonary disease: Secondary | ICD-10-CM | POA: Insufficient documentation

## 2011-10-07 DIAGNOSIS — R0609 Other forms of dyspnea: Secondary | ICD-10-CM | POA: Insufficient documentation

## 2011-10-07 DIAGNOSIS — J449 Chronic obstructive pulmonary disease, unspecified: Secondary | ICD-10-CM | POA: Insufficient documentation

## 2011-10-07 DIAGNOSIS — R0602 Shortness of breath: Secondary | ICD-10-CM | POA: Insufficient documentation

## 2011-10-07 DIAGNOSIS — R0989 Other specified symptoms and signs involving the circulatory and respiratory systems: Secondary | ICD-10-CM | POA: Insufficient documentation

## 2011-10-12 ENCOUNTER — Encounter (HOSPITAL_COMMUNITY): Payer: Medicare Other

## 2011-10-14 ENCOUNTER — Encounter (HOSPITAL_COMMUNITY): Payer: Medicare Other

## 2011-10-19 ENCOUNTER — Encounter (HOSPITAL_COMMUNITY): Payer: Medicare Other

## 2011-10-21 ENCOUNTER — Encounter (HOSPITAL_COMMUNITY): Payer: Medicare Other

## 2011-10-26 ENCOUNTER — Encounter (HOSPITAL_COMMUNITY): Payer: Medicare Other

## 2011-10-28 ENCOUNTER — Encounter (HOSPITAL_COMMUNITY): Payer: Medicare Other

## 2011-11-04 ENCOUNTER — Encounter: Payer: Self-pay | Admitting: Internal Medicine

## 2011-11-04 ENCOUNTER — Ambulatory Visit (INDEPENDENT_AMBULATORY_CARE_PROVIDER_SITE_OTHER): Payer: Medicare Other | Admitting: Internal Medicine

## 2011-11-04 VITALS — BP 124/68 | HR 84 | Temp 98.4°F | Ht 60.0 in | Wt 124.4 lb

## 2011-11-04 DIAGNOSIS — R5381 Other malaise: Secondary | ICD-10-CM | POA: Diagnosis not present

## 2011-11-04 DIAGNOSIS — C342 Malignant neoplasm of middle lobe, bronchus or lung: Secondary | ICD-10-CM | POA: Diagnosis not present

## 2011-11-04 DIAGNOSIS — B370 Candidal stomatitis: Secondary | ICD-10-CM | POA: Diagnosis not present

## 2011-11-04 DIAGNOSIS — R5383 Other fatigue: Secondary | ICD-10-CM

## 2011-11-04 DIAGNOSIS — J449 Chronic obstructive pulmonary disease, unspecified: Secondary | ICD-10-CM | POA: Diagnosis not present

## 2011-11-04 NOTE — Patient Instructions (Signed)
#  fatigue  - this was due to copd and cancer and radiation and deconditioning  - glad you are much better with rehab  - continue rehab  #COPD (very severe copd)  - copd is stable - - continue inhalers as before and oxygen with exertion and sleep - we will set up walk test and overnight pulse ox test to see if you still need oxygen or not  #t#Cancer   - this is improved on CT scan 07/12/11.  - please keep up followup with Dr Michell Heinrich in July 2013  #Followup - 6 months with CAT score at followup

## 2011-11-04 NOTE — Progress Notes (Signed)
Subjective:    Patient ID: Heather Barnes, female    DOB: 1933-11-25, 78 y.o.   MRN: [id number]  HPI 78 year old ex 20 pack  Smoker quit 2000. REferred for severe dyspnea and pulmonary nodule.  OV 02/17/11: Followup COPD/dyspnea after PFT, pulmonary nodule and chest pain (atypical). Since last visit chest pain resolved. Finds o2and inhalers helps. No new complaints. She is here to discuss pft results and serum oncimmune test results. PFTs show gold stage 3 copd - fev1 0.6L/43%, ratio 44, dlco 5/34%. SErum onciummne test shows 1 antigen positive for lung cancer (highly specific test with high test positive predictive value). Her next ct scan is  Due mid-oct 2012    OV 04/23/2011: Fu after lung bx. She had PET - nodule was hot. No other uptake (discussed at [facility] prior to bx as well). MTOC feeling (see phone note) was not a surgical candidate for lobectomy but possibly for wedge resection with seeds. Bx 04/14/11 - NSCLC. Tolerated bx well. Post bx CXR showed local hge but not ptx. Immediate post bx phase well but last 3 days having early morning mild, small amount of hemoptysis (old dried blood). Not present rest of day. Also, same time  New nasal drainage that is clear. Denies increased, cough, wheeze, edema, chest pain, symcope. Walking sats 185 feet x 3 laps; pulse ox lowest was 90%. Spirometry shows: Fev1 today: 0.58L/33%; and almost gold stage 4 copd. CXR c/w  post biopsy changes and looks fairly clear. rec  06/11/2011 f/u ov/Wert cc increased sob and need for saba over baseline assoc with nasal congestion and hacking cough, white mucus, using lots of cough drops and on fosfamax q Saturday. No overt hb, no sinus pain or purulent drainage  Sleeping ok without nocturnal  or early am exacerbation  of respiratory  c/o's or need for noct saba. Also denies any obvious fluctuation of symptoms with weather or environmental changes or other aggravating or alleviating factors except as outlined above   ROS   At present neg for  any significant sore throat, dysphagia, itching, sneezing,  nasal congestion or excess/ purulent secretions,  fever, chills, sweats, unintended wt loss, pleuritic or exertional cp, hempoptysis, orthopnea pnd or leg swelling.  Also denies presyncope, palpitations, heartburn, abdominal pain, nausea, vomiting, diarrhea  or change in bowel or urinary habits, dysuria,hematuria,  rash, arthralgias, visual complaints, headache, numbness weakness or ataxia.     OV 07/22/2011  Followup  Gold stage 3-4 COPD and Rt lung nodule - NSCLC, s.p curative XRT by Dr Michell Heinrich  Overall well and stable. Has recovered from AECOPD and back to baseline. Says only main issue is that fatigue with exeriton of 20-30 feet and this is asosciated with desaturation and tachycardia (hr 120s).  Lot of questions on expectations. Wants to go back to rehab. Today walking 185 feet x 3 laps: resting HR 91 with pulse ox 92%. AT end of 3 laps HR 112 with lowest pulse ox 86%. Denies chest pain. Recollects normal stress test years ago.   #COPD and shortness of breath and fatigue  - copd is stable  - fatigue is due to copd and recent cancer v cancer treatment  - nurse will re-refer you to rehab  - continue inhalers as before and oxygen with exertion and sleep  - if you develop chest pain let us know  -at folllowup will check TSH  #thrush  - there is yeast in your mouth and your voice is hoarse which means it  could be in your throat too  - take diflucan tablet 100mg  daily x 5 days  -- rinse your mouth after inhaler use  - if it comes back, we have to consider some other inhaler  #Cancer  - this is improved on CT scan 07/12/11.  - I will let Dr Michell Heinrich know who plans to see you in august  - please chck with Dr Michell Heinrich if ok to have mammogram  #Followup  - 3 months with spirometry at followup  - come sooner if there are problems   OV 11/04/2011 Followup cancer fatigue and oral thrush and copd (gold stage  4)  Cancer fatigue nearly resolved after attending rehab; she is very emoptional about the dramatic improvement due to rehab. She felt she lost hope and now has regained it. Oral thrush has resolved. In terms of copd, feels she is stable without worsening dyspnea or cough. FEv1 today 0.61L/36%, Ratio 45 and at basline gold stage 4 copd. CAT score 23 and reflects high copd burden   CAT COPD Symptom and Quality of Life Score ([employer])  0 (no burden) to 5 (highest burden)  Never Cough -> Cough all the time 2  No phlegm in chest -> Chest is full of phlegm 3  No chest tightness -> Chest feels very tight 2  No dyspnea for 1 flight stairs/hill -> Very dyspneic for 1 flight of stairs 5  No limitations for ADL at home -> Very limited with ADL at home 3  Confident leaving home -> Not at all confident leaving home 3  Sleep soundly -> Do not sleep soundly because of lung condition 2  Lots of Energy -> No energy at all 3  TOTAL Score (max 40)  23     Past, Family, Social reviewed: no change since last visit   Review of Systems  Constitutional: Negative for fever and unexpected weight change.  HENT: Negative for ear pain, nosebleeds, congestion, sore throat, rhinorrhea, sneezing, trouble swallowing, dental problem, postnasal drip and sinus pressure.   Eyes: Negative for redness and itching.  Respiratory: Negative for cough, chest tightness, shortness of breath and wheezing.   Cardiovascular: Negative for palpitations and leg swelling.  Gastrointestinal: Negative for nausea and vomiting.  Genitourinary: Negative for dysuria.  Musculoskeletal: Negative for joint swelling.  Skin: Negative for rash.  Neurological: Negative for headaches.  Hematological: Does not bruise/bleed easily.  Psychiatric/Behavioral: Negative for dysphoric mood. The patient is not nervous/anxious.        Objective:   Physical Exam Vitals reviewed. Constitutional: She is oriented to person, place, and  time. She appears well-developed and well-nourished. No distress.  HENT:  Head: Normocephalic and atraumatic.  Right Ear: External ear normal.  Left Ear: normal Mouth/Throat: Oropharynx is clear and moist.   Eyes: Conjunctivae and EOM are normal. Pupils are equal, round, and reactive to light. Right eye exhibits no discharge. Left eye exhibits no discharge. No scleral icterus.  Neck: Normal range of motion. Neck supple. No JVD present. No tracheal deviation present. No thyromegaly present.  Cardiovascular: Normal rate, regular rhythm, normal heart sounds and intact distal pulses.  Exam reveals no gallop and no friction rub.   No murmur heard. Pulmonary/Chest: Effort normal and breath sounds normal. No respiratory distress. She has no wheezes. She has no rales. She exhibits no tenderness.       barrell chest expiraiton prolonged. No wheeze. No purse lip breathing  Abdominal: Soft. Bowel sounds are normal. She exhibits no distension  and no mass. There is no tenderness. There is no rebound and no guarding.  Musculoskeletal: Normal range of motion. She exhibits no edema and no tenderness.  Lymphadenopathy:    She has no cervical adenopathy.  Neurological: She is alert and oriented to person, place, and time. She has normal reflexes. No cranial nerve deficit. She exhibits normal muscle tone. Coordination normal.  Skin: Skin is warm and dry. No rash noted. She is not diaphoretic. No erythema. No pallor.  Psychiatric: She has a normal mood and affect. Her behavior is normal. Judgment and thought content normal.             Assessment & Plan:

## 2011-11-07 ENCOUNTER — Encounter: Payer: Self-pay | Admitting: Internal Medicine

## 2011-11-07 NOTE — Assessment & Plan Note (Signed)
#  fatigue  - this was due to copd and cancer and radiation and deconditioning  - glad you are much better with rehab  - continue rehab

## 2011-11-07 NOTE — Assessment & Plan Note (Signed)
#  COPD (very severe copd)  - copd is stable - - continue inhalers as before and oxygen with exertion and sleep - we will set up walk test and overnight pulse ox test to see if you still need oxygen or not #Followup - 6 months with CAT score at followup

## 2011-11-07 NOTE — Assessment & Plan Note (Signed)
resolved 

## 2011-11-07 NOTE — Assessment & Plan Note (Signed)
#  t#Cancer   - this is improved on CT scan 07/12/11.  - please keep up followup with Dr Michell Heinrich in July 2013

## 2011-11-09 DIAGNOSIS — J449 Chronic obstructive pulmonary disease, unspecified: Secondary | ICD-10-CM | POA: Diagnosis not present

## 2011-11-22 ENCOUNTER — Telehealth: Payer: Self-pay | Admitting: Internal Medicine

## 2011-11-22 NOTE — Telephone Encounter (Signed)
Ono on RA 11/09/11 shows only 12 sec below 88% at sleep. So no need for o2 at night but needs it with exertion

## 2011-11-24 NOTE — Telephone Encounter (Signed)
I spoke with patient about results and she verbalized understanding and had no questions 

## 2011-12-02 ENCOUNTER — Encounter: Payer: Self-pay | Admitting: Internal Medicine

## 2011-12-08 NOTE — Progress Notes (Signed)
Pulmonary Rehabilitation Program Outcomes Report Orientation:  02[birth year]17-[birth year] Graduate Date:  10/07/2011  # of sessions completed:36  Pulmonologist: Ramaswamy Class Time:  1030  A.  Exercise Program:  Tolerates exercise @ 4.3 METS for 45 minutes, Walk Test Results:  Pre: 866ft and Post: 11102ft, Improved functional capacity  31.94 %, No Change  muscular strength  0.00 %, Decreased dyspnea score 7.00 %, Decreased education score 10.00 %, Exercise limited by dyspnea, Discharged to home exercise program.  Anticipated compliance:  excellent and Discharged  B.  Mental Health:  Health related anxiety, Significant family stress and Quality of Life (QOL)  changes:  Overall  20.21 %, Health/Functioning 64.13 %, Socioeconomics 3.71 %, Psych/Spiritual 2.51 %, Family 8.33 %    C.  Education/Instruction/Skills  Uses Perceived Exertion Scale and/or Dyspnea Scale and Attended 16 education classes  Demonstrates accurate diaphragmatic breathing and pursed lip breathing. Home Exercise given on 04/08/2011  D.  Nutrition/Weight Control/Body Composition:  Pt is eating a healthy diet.  Pt wt is down 2.8 kg. BMI 24.1, % body fat 34.8%. Pt with 6.7%  decrease in % body fat. Section Completed by: Mickle Plumb, M.Ed, RD, LDN, CDE    E.  Blood Lipids  No results found for this basename: CHOL, HDL, LDLCALC, LDLDIRECT, TRIG, CHOLHDL   F.  Lifestyle Changes:  Making positive lifestyle changes  G.  Symptoms noted with exercise:  Shortness of breath, Dizziness, Nausea, Fatigue and Exertional hypertension  Report Completed By:  Frederik Schmidt. Manson Passey   Comments:  Patient tolerated well with all exercises. Patient maintained stable vitals on 2L NCC. Patient was able to reach her goals and satisfied with the outcomes listed above. Even with patients other health concerns she was an inspiration to all. Always worked hard and improved with each session. Patient very compliant with her home exercises and  it showed during rehab. Patient also compliant with her attendance and it made a huge difference in her goals as well as her endurance to keep going. Patient able to continue living independently and do her ADLs alone. Great to work with.  Courtney L. Manson Passey, MS, NASM, CES   Ms Tourigny has been a delightful patient to work with.   We are very pleased she has progressed.  Agree with the above note.  Cathie Olden RN

## 2011-12-27 ENCOUNTER — Ambulatory Visit
Admission: RE | Admit: 2011-12-27 | Discharge: 2011-12-27 | Disposition: A | Discharge status: Home / Self Care | Payer: Medicare Other | Source: Ambulatory Visit | Attending: Radiation Oncology | Admitting: Radiation Oncology

## 2011-12-27 DIAGNOSIS — C342 Malignant neoplasm of middle lobe, bronchus or lung: Secondary | ICD-10-CM | POA: Diagnosis not present

## 2011-12-27 LAB — BUN: BUN: 9 mg/dL (ref 6–23)

## 2011-12-29 ENCOUNTER — Ambulatory Visit (HOSPITAL_COMMUNITY)
Admission: RE | Admit: 2011-12-29 | Discharge: 2011-12-29 | Disposition: A | Discharge status: Home / Self Care | Payer: Medicare Other | Source: Ambulatory Visit | Attending: Radiation Oncology | Admitting: Radiation Oncology

## 2011-12-29 DIAGNOSIS — R918 Other nonspecific abnormal finding of lung field: Secondary | ICD-10-CM | POA: Diagnosis not present

## 2011-12-29 DIAGNOSIS — I2510 Atherosclerotic heart disease of native coronary artery without angina pectoris: Secondary | ICD-10-CM | POA: Insufficient documentation

## 2011-12-29 DIAGNOSIS — J438 Other emphysema: Secondary | ICD-10-CM | POA: Insufficient documentation

## 2011-12-29 DIAGNOSIS — C342 Malignant neoplasm of middle lobe, bronchus or lung: Secondary | ICD-10-CM

## 2011-12-29 DIAGNOSIS — Z85118 Personal history of other malignant neoplasm of bronchus and lung: Secondary | ICD-10-CM | POA: Insufficient documentation

## 2011-12-29 DIAGNOSIS — I700 Atherosclerosis of aorta: Secondary | ICD-10-CM | POA: Diagnosis not present

## 2011-12-29 DIAGNOSIS — K7689 Other specified diseases of liver: Secondary | ICD-10-CM | POA: Diagnosis not present

## 2011-12-29 DIAGNOSIS — Z923 Personal history of irradiation: Secondary | ICD-10-CM | POA: Insufficient documentation

## 2011-12-29 DIAGNOSIS — R0602 Shortness of breath: Secondary | ICD-10-CM | POA: Diagnosis not present

## 2011-12-29 MED ORDER — IOHEXOL 300 MG/ML  SOLN
80.0000 mL | Freq: Once | INTRAMUSCULAR | Status: AC | PRN
  Administered 2011-12-29: 80 mL via INTRAVENOUS

## 2011-12-30 ENCOUNTER — Ambulatory Visit
Admission: RE | Admit: 2011-12-30 | Discharge: 2011-12-30 | Disposition: A | Discharge status: Home / Self Care | Payer: Medicare Other | Source: Ambulatory Visit | Attending: Radiation Oncology | Admitting: Radiation Oncology

## 2011-12-30 ENCOUNTER — Encounter: Payer: Self-pay | Admitting: Radiation Oncology

## 2011-12-30 VITALS — BP 115/54 | HR 76 | Temp 97.2°F | Resp 20 | Wt 123.3 lb

## 2011-12-30 DIAGNOSIS — C342 Malignant neoplasm of middle lobe, bronchus or lung: Secondary | ICD-10-CM

## 2011-12-30 NOTE — Progress Notes (Signed)
Patient presents to the clinic today accompanied by her husband for a follow up appointment with Dr. Michell Heinrich. Patient is alert and oriented to person, place, and time. No distress noted. Steady gait noted. Pleasant affect noted. Patient denies pain at this time. Pulsating oxygen therapy 2 liters via nasal cannula noted. Patient reports shortness of breath only with exertion. Also, patient reports the only time she uses her oxygen is to relieve SOB with exertion. Patient denies using oxygen therapy while sleeping. Patient denies cough. Patient reports she tires easily but, that her strength and energy level have improved. Patient reports she is occasional hoarse but relates that to seasonal allergies and asthma. Patient has no other complaints. Reported all findings to Dr. Michell Heinrich.

## 2011-12-30 NOTE — Progress Notes (Signed)
   Department of Radiation Oncology  Phone:  [phone] Fax:        [phone]   Name: Heather Barnes   DOB: 26-May-1934  MRN: [id number]    Date: 12/30/2011  Follow Up Visit Note  Diagnosis: T1N0 NSCLC of right middle lobe  Interval since last radiation: 7 months  Interval History: Heather Barnes presents today for routine followup.  Her breathing symptoms are stable. She says they get slightly worse when the weather is very humid outside. She has decreased energy but feels like this is improving. She has no cough. She has some hoarseness associated with her allergy-type symptoms. She has appointment Dr. Marchelle Gearing in November. She had a CT of the chest on 12/29/2011 which shows post radiation changes with no definitive nodule. No evidence of recurrent disease in the mediastinum or periphery was noted. No new nodules.  Allergies:  Allergies  Allergen Reactions  . Codeine Other (See Comments)    Hyperactivity,crying  . Tiotropium Bromide Monohydrate     rash    Medications:  Current Outpatient Prescriptions  Medication Sig Dispense Refill  . albuterol (PROAIR HFA) 108 (90 BASE) MCG/ACT inhaler Inhale 2 puffs into the lungs every 6 (six) hours as needed.        [name] alendronate (FOSAMAX) 70 MG tablet Take 70 mg by mouth every 7 (seven) days. Take with a full glass of water on an empty stomach.       [name] atorvastatin (LIPITOR) 40 MG tablet Daily.      . budesonide-formoterol (SYMBICORT) 160-4.5 MCG/ACT inhaler Inhale 2 puffs into the lungs 2 (two) times daily.        . cetirizine (ZYRTEC) 10 MG tablet Take 10 mg by mouth daily.        . Cholecalciferol (VITAMIN D) 2000 UNITS CAPS Take 2 capsules by mouth daily.        . fluticasone (FLONASE) 50 MCG/ACT nasal spray Place 2 sprays into the nose as needed.       [name] ipratropium (ATROVENT HFA) 17 MCG/ACT inhaler Inhale 2 puffs into the lungs every 6 (six) hours.        . Multiple Vitamins-Minerals (MULTIVITAMIN WITH MINERALS) tablet Take 1  tablet by mouth daily.        [name] SINGULAIR 10 MG tablet Take 1 tablet by mouth Daily.        Physical Exam:   weight is 123 lb 4.8 oz (55.929 kg). Her oral temperature is 97.2 F (36.2 C). Her blood pressure is 115/54 and her pulse is 76. Her respiration is 20 and oxygen saturation is 97%.  She is a pleasant female in no distress who appears older than her stated age.  IMPRESSION: Heather Barnes is a 78 y.o. female with no evidence of disease status post SBRT to a right middle lobe lesion  PLAN:  Heather Barnes looks great. I will plan on seeing her back in a year with a CT scan at that time. She has followup with Dr. Marchelle Gearing in November. Will schedule a CT scan prior to that visit as well.    Heather Hare, MD

## 2011-12-31 ENCOUNTER — Telehealth: Payer: Self-pay | Admitting: *Deleted

## 2011-12-31 NOTE — Telephone Encounter (Signed)
CALLED PATIENT TO INFORM OF TESTS, SPOKE WITH PATIENT AND SHE IS AWARE OF THESE TESTS 

## 2012-01-07 DIAGNOSIS — R071 Chest pain on breathing: Secondary | ICD-10-CM | POA: Diagnosis not present

## 2012-02-17 DIAGNOSIS — J309 Allergic rhinitis, unspecified: Secondary | ICD-10-CM | POA: Diagnosis not present

## 2012-02-17 DIAGNOSIS — J45909 Unspecified asthma, uncomplicated: Secondary | ICD-10-CM | POA: Diagnosis not present

## 2012-02-17 DIAGNOSIS — K219 Gastro-esophageal reflux disease without esophagitis: Secondary | ICD-10-CM | POA: Diagnosis not present

## 2012-02-17 DIAGNOSIS — M810 Age-related osteoporosis without current pathological fracture: Secondary | ICD-10-CM | POA: Diagnosis not present

## 2012-02-17 DIAGNOSIS — C3490 Malignant neoplasm of unspecified part of unspecified bronchus or lung: Secondary | ICD-10-CM | POA: Diagnosis not present

## 2012-02-17 DIAGNOSIS — R071 Chest pain on breathing: Secondary | ICD-10-CM | POA: Diagnosis not present

## 2012-02-17 DIAGNOSIS — E782 Mixed hyperlipidemia: Secondary | ICD-10-CM | POA: Diagnosis not present

## 2012-02-23 DIAGNOSIS — C3490 Malignant neoplasm of unspecified part of unspecified bronchus or lung: Secondary | ICD-10-CM | POA: Diagnosis not present

## 2012-02-23 DIAGNOSIS — E782 Mixed hyperlipidemia: Secondary | ICD-10-CM | POA: Diagnosis not present

## 2012-02-23 DIAGNOSIS — J309 Allergic rhinitis, unspecified: Secondary | ICD-10-CM | POA: Diagnosis not present

## 2012-02-23 DIAGNOSIS — E049 Nontoxic goiter, unspecified: Secondary | ICD-10-CM | POA: Diagnosis not present

## 2012-02-23 DIAGNOSIS — Z23 Encounter for immunization: Secondary | ICD-10-CM | POA: Diagnosis not present

## 2012-02-23 DIAGNOSIS — M810 Age-related osteoporosis without current pathological fracture: Secondary | ICD-10-CM | POA: Diagnosis not present

## 2012-02-23 DIAGNOSIS — K219 Gastro-esophageal reflux disease without esophagitis: Secondary | ICD-10-CM | POA: Diagnosis not present

## 2012-02-23 DIAGNOSIS — J449 Chronic obstructive pulmonary disease, unspecified: Secondary | ICD-10-CM | POA: Diagnosis not present

## 2012-04-27 ENCOUNTER — Ambulatory Visit
Admission: RE | Admit: 2012-04-27 | Discharge: 2012-04-27 | Disposition: A | Discharge status: Home / Self Care | Payer: Medicare Other | Source: Ambulatory Visit | Attending: Radiation Oncology | Admitting: Radiation Oncology

## 2012-04-27 DIAGNOSIS — C342 Malignant neoplasm of middle lobe, bronchus or lung: Secondary | ICD-10-CM | POA: Diagnosis not present

## 2012-05-01 ENCOUNTER — Ambulatory Visit (HOSPITAL_COMMUNITY)
Admission: RE | Admit: 2012-05-01 | Discharge: 2012-05-01 | Disposition: A | Discharge status: Home / Self Care | Payer: Medicare Other | Source: Ambulatory Visit | Attending: Radiation Oncology | Admitting: Radiation Oncology

## 2012-05-01 DIAGNOSIS — C342 Malignant neoplasm of middle lobe, bronchus or lung: Secondary | ICD-10-CM | POA: Insufficient documentation

## 2012-05-01 DIAGNOSIS — J984 Other disorders of lung: Secondary | ICD-10-CM | POA: Diagnosis not present

## 2012-05-01 DIAGNOSIS — M8448XA Pathological fracture, other site, initial encounter for fracture: Secondary | ICD-10-CM | POA: Diagnosis not present

## 2012-05-01 DIAGNOSIS — Z923 Personal history of irradiation: Secondary | ICD-10-CM | POA: Insufficient documentation

## 2012-05-01 DIAGNOSIS — Z85118 Personal history of other malignant neoplasm of bronchus and lung: Secondary | ICD-10-CM | POA: Diagnosis not present

## 2012-05-01 MED ORDER — IOHEXOL 300 MG/ML  SOLN
80.0000 mL | Freq: Once | INTRAMUSCULAR | Status: AC | PRN
  Administered 2012-05-01: 80 mL via INTRAVENOUS

## 2012-05-15 ENCOUNTER — Telehealth: Payer: Self-pay | Admitting: Internal Medicine

## 2012-05-15 ENCOUNTER — Ambulatory Visit (INDEPENDENT_AMBULATORY_CARE_PROVIDER_SITE_OTHER): Payer: Medicare Other | Admitting: Internal Medicine

## 2012-05-15 ENCOUNTER — Encounter: Payer: Self-pay | Admitting: Internal Medicine

## 2012-05-15 VITALS — BP 136/84 | HR 83 | Temp 97.9°F | Wt 123.4 lb

## 2012-05-15 DIAGNOSIS — S2231XA Fracture of one rib, right side, initial encounter for closed fracture: Secondary | ICD-10-CM

## 2012-05-15 DIAGNOSIS — J449 Chronic obstructive pulmonary disease, unspecified: Secondary | ICD-10-CM

## 2012-05-15 DIAGNOSIS — C342 Malignant neoplasm of middle lobe, bronchus or lung: Secondary | ICD-10-CM | POA: Diagnosis not present

## 2012-05-15 NOTE — Assessment & Plan Note (Signed)
STable disease. S/p flu shot +. Continue symbicport. Next visit will ask about tudorza (she had rash with spiriva)

## 2012-05-15 NOTE — Assessment & Plan Note (Signed)
Rt Rib fracture next to tumor Nov 2013 Ct chest. Likely post XRT. I will check with Dr Michell Heinrich of rad onc if she thinks this is due to tumor invassion. Pain controil via warm pads

## 2012-05-15 NOTE — Telephone Encounter (Signed)
Stacy  Please looks at 05/01/12 CT chest: Suspect post XRT consolidative changes on nodule. Agree? Also, associated rib fracture - suspect due to XRT as opposed to tumor invasion. Agree?  Thanks  MR

## 2012-05-15 NOTE — Patient Instructions (Addendum)
#  fatigue  - this was due to copd and cancer and radiation and deconditioning  - glad this is not much of a problem  - continue regular exercise  #COPD (very severe copd)  - copd is stable - - continue inhalers as before and oxygen with exertion and sleep   #Cancer and Rt rib fracture  - the rib fractue is likely due to radiation and underlying osteoporosis; continue current pain management of warm pads   - the cancer appears stable without spread on CT Nov 2013 ; the changes seen are due to radiation I think but will have Dr Michell Heinrich double check to make sure I am right   #Followup - 6 months with CAT score at followup

## 2012-05-15 NOTE — Assessment & Plan Note (Signed)
RML lateral segment 8-11mm nodule subpleural - new since 2008 as of July 2012. Perists oct 2010. Serum lung cancer antigen test positive in July 2012. High pre-test for NSCLC. S/p bx nov 2012: NSCLC. PET c/w STage 1. Consensus is stage 1A at [facility]. S/p XRT. FU CT 07/22/11 shows improvement. Nov 2013 shows consolidation; likely post XRT change. Will check with Dr Michell Heinrich

## 2012-05-15 NOTE — Progress Notes (Signed)
Subjective:    Patient ID: Heather Barnes, female    DOB: 1933-11-25, 78 y.o.   MRN: [id number]  HPI 78 year old ex 20 pack  Smoker quit 2000. REferred for severe dyspnea and pulmonary nodule.  OV 02/17/11: Followup COPD/dyspnea after PFT, pulmonary nodule and chest pain (atypical). Since last visit chest pain resolved. Finds o2and inhalers helps. No new complaints. She is here to discuss pft results and serum oncimmune test results. PFTs show gold stage 3 copd - fev1 0.6L/43%, ratio 44, dlco 5/34%. SErum onciummne test shows 1 antigen positive for lung cancer (highly specific test with high test positive predictive value). Her next ct scan is  Due mid-oct 2012    OV 04/23/2011: Fu after lung bx. She had PET - nodule was hot. No other uptake (discussed at [facility] prior to bx as well). MTOC feeling (see phone note) was not a surgical candidate for lobectomy but possibly for wedge resection with seeds. Bx 04/14/11 - NSCLC. Tolerated bx well. Post bx CXR showed local hge but not ptx. Immediate post bx phase well but last 3 days having early morning mild, small amount of hemoptysis (old dried blood). Not present rest of day. Also, same time  New nasal drainage that is clear. Denies increased, cough, wheeze, edema, chest pain, symcope. Walking sats 185 feet x 3 laps; pulse ox lowest was 90%. Spirometry shows: Fev1 today: 0.58L/33%; and almost gold stage 4 copd. CXR c/w  post biopsy changes and looks fairly clear. rec  06/11/2011 f/u ov/Wert cc increased sob and need for saba over baseline assoc with nasal congestion and hacking cough, white mucus, using lots of cough drops and on fosfamax q Saturday. No overt hb, no sinus pain or purulent drainage  Sleeping ok without nocturnal  or early am exacerbation  of respiratory  c/o's or need for noct saba. Also denies any obvious fluctuation of symptoms with weather or environmental changes or other aggravating or alleviating factors except as outlined above   ROS   At present neg for  any significant sore throat, dysphagia, itching, sneezing,  nasal congestion or excess/ purulent secretions,  fever, chills, sweats, unintended wt loss, pleuritic or exertional cp, hempoptysis, orthopnea pnd or leg swelling.  Also denies presyncope, palpitations, heartburn, abdominal pain, nausea, vomiting, diarrhea  or change in bowel or urinary habits, dysuria,hematuria,  rash, arthralgias, visual complaints, headache, numbness weakness or ataxia.     OV 07/22/2011  Followup  Gold stage 3-4 COPD and Rt lung nodule - NSCLC, s.p curative XRT by Dr Michell Heinrich  Overall well and stable. Has recovered from AECOPD and back to baseline. Says only main issue is that fatigue with exeriton of 20-30 feet and this is asosciated with desaturation and tachycardia (hr 120s).  Lot of questions on expectations. Wants to go back to rehab. Today walking 185 feet x 3 laps: resting HR 91 with pulse ox 92%. AT end of 3 laps HR 112 with lowest pulse ox 86%. Denies chest pain. Recollects normal stress test years ago.   #COPD and shortness of breath and fatigue  - copd is stable  - fatigue is due to copd and recent cancer v cancer treatment  - nurse will re-refer you to rehab  - continue inhalers as before and oxygen with exertion and sleep  - if you develop chest pain let us know  -at folllowup will check TSH  #thrush  - there is yeast in your mouth and your voice is hoarse which means it  could be in your throat too  - take diflucan tablet 100mg  daily x 5 days  -- rinse your mouth after inhaler use  - if it comes back, we have to consider some other inhaler  #Cancer  - this is improved on CT scan 07/12/11.  - I will let Dr Michell Heinrich know who plans to see you in august  - please chck with Dr Michell Heinrich if ok to have mammogram  #Followup  - 3 months with spirometry at followup  - come sooner if there are problems   OV 11/04/2011 Followup cancer fatigue and oral thrush and copd (gold stage  4)  Cancer fatigue nearly resolved after attending rehab; she is very emoptional about the dramatic improvement due to rehab. She felt she lost hope and now has regained it. Oral thrush has resolved. In terms of copd, feels she is stable without worsening dyspnea or cough. FEv1 today 0.61L/36%, Ratio 45 and at basline gold stage 4 copd. CAT score 23 and reflects high copd burden   Past, Family, Social reviewed: no change since last visit  REC #fatigue  - this was due to copd and cancer and radiation and deconditioning  - glad you are much better with rehab  - continue rehab  #COPD (very severe copd)  - copd is stable  - - continue inhalers as before and oxygen with exertion and sleep  - we will set up walk test and overnight pulse ox test to see if you still need oxygen or not  #t#Cancer  - this is improved on CT scan 07/12/11.  - please keep up followup with Dr Michell Heinrich in July 2013  #Followup  - 6 months with CAT score at followup     OV 05/15/2012  Followup COPD (very severe), fatigue and lung cancer surveillance  FAtigue: there is some fatigue if at all but nearly nowhere as bad as before. She keeps up with exercise  COPD: Stable disease. CAT score (below) is 21 and improved from 23. She is allergic to spiriva so manages on symbicort. Wants refills  Lung cancer: she is s/p XRT. She had CT 05/01/12 and to me the nodule looks more consolidated likely reflective of consolidative process post radiation. There is description of new rib fracture associated with it. Per patient she felf sharp pain during dye injection in july2013 CT and since then in the local area has had pain that has slowly improved. Apparently few months ago CXR by PMD did not show fracture but she has always been convinced she has fracture. This Nov 2013 CT has validated her feeling. She is content with warm pads. I suspect this is post xrt fractue but radiologist are questioning tumor invasion. I will check with Dr  Michell Heinrich   CAT COPD Symptom and Quality of Life Score ([employer]ax[employer]ith kline trademark)   10/3011 05/15/2012 3  Never Cough -> Cough all the time 2 3  No phlegm in chest -> Chest is full of phlegm 3 3  No chest tightness -> Chest feels very tight 2 0  No dyspnea for 1 flight stairs/hill -> Very dyspneic for 1 flight of stairs 5 5  No limitations for ADL at home -> Very limited with ADL at home 3 3  Confident leaving home -> Not at all confident leaving home 3 4  Sleep soundly -> Do not sleep soundly because of lung condition 2 3  Lots of Energy -> No energy at all 3 0  TOTAL Score (max 40)  23 21   CT chest 05/01/12 IMPRESSION:  1. Fracture of the right eighth rib laterally is near the original  right middle lobe nodule and could reflect a pathologic fracture.  No associated lytic lesion identified.  2. Subpleural density in the right middle lobe adjacent to the rib  fracture, without evidence of residual or recurrent mass.  3. No evidence of metastatic disease  Original Report Authenticated By: Carey Bullocks, M   Review of Systems  Constitutional: Negative for fever and unexpected weight change.  HENT: Negative for ear pain, nosebleeds, congestion, sore throat, rhinorrhea, sneezing, trouble swallowing, dental problem, postnasal drip and sinus pressure.   Eyes: Negative for redness and itching.  Respiratory: Negative for cough, chest tightness, shortness of breath and wheezing.   Cardiovascular: Negative for palpitations and leg swelling.  Gastrointestinal: Negative for nausea and vomiting.  Genitourinary: Negative for dysuria.  Musculoskeletal: Negative for joint swelling.  Skin: Negative for rash.  Neurological: Negative for headaches.  Hematological: Does not bruise/bleed easily.  Psychiatric/Behavioral: Negative for dysphoric mood. The patient is not nervous/anxious.        Objective:   Physical Exam Vitals reviewed. Constitutional: She is oriented to person, place, and  time. She appears well-developed and well-nourished. No distress.  HENT:  Head: Normocephalic and atraumatic.  Right Ear: External ear normal.  Left Ear: normal Mouth/Throat: Oropharynx is clear and moist.   Eyes: Conjunctivae and EOM are normal. Pupils are equal, round, and reactive to light. Right eye exhibits no discharge. Left eye exhibits no discharge. No scleral icterus.  Neck: Normal range of motion. Neck supple. No JVD present. No tracheal deviation present. No thyromegaly present.  Cardiovascular: Normal rate, regular rhythm, normal heart sounds and intact distal pulses.  Exam reveals no gallop and no friction rub.   No murmur heard. Pulmonary/Chest: Effort normal and breath sounds normal. No respiratory distress. She has no wheezes. She has no rales. She exhibits no tenderness.       barrell chest expiraiton prolonged. No wheeze. No purse lip breathing  Mid tenderness rt infraaxillary, infra mammnary area at area of rib fracture  Abdominal: Soft. Bowel sounds are normal. She exhibits no distension and no mass. There is no tenderness. There is no rebound and no guarding.  Musculoskeletal: Normal range of motion. She exhibits no edema and no tenderness.  Lymphadenopathy:    She has no cervical adenopathy.  Neurological: She is alert and oriented to person, place, and time. She has normal reflexes. No cranial nerve deficit. She exhibits normal muscle tone. Coordination normal.  Skin: Skin is warm and dry. No rash noted. She is not diaphoretic. No erythema. No pallor.  Psychiatric: She has a normal mood and affect. Her behavior is normal. Judgment and thought content normal.            Assessment & Plan:

## 2012-05-16 NOTE — Telephone Encounter (Signed)
Agreed. Likely treatment change.  Will monitor. I think I see her this week.

## 2012-05-17 NOTE — Telephone Encounter (Signed)
Thanks

## 2012-08-17 ENCOUNTER — Other Ambulatory Visit: Payer: Self-pay | Admitting: Family Medicine

## 2012-08-17 DIAGNOSIS — E049 Nontoxic goiter, unspecified: Secondary | ICD-10-CM

## 2012-08-28 DIAGNOSIS — M810 Age-related osteoporosis without current pathological fracture: Secondary | ICD-10-CM | POA: Diagnosis not present

## 2012-08-29 DIAGNOSIS — K219 Gastro-esophageal reflux disease without esophagitis: Secondary | ICD-10-CM | POA: Diagnosis not present

## 2012-08-29 DIAGNOSIS — Z23 Encounter for immunization: Secondary | ICD-10-CM | POA: Diagnosis not present

## 2012-08-29 DIAGNOSIS — C3490 Malignant neoplasm of unspecified part of unspecified bronchus or lung: Secondary | ICD-10-CM | POA: Diagnosis not present

## 2012-08-29 DIAGNOSIS — M810 Age-related osteoporosis without current pathological fracture: Secondary | ICD-10-CM | POA: Diagnosis not present

## 2012-08-29 DIAGNOSIS — E049 Nontoxic goiter, unspecified: Secondary | ICD-10-CM | POA: Diagnosis not present

## 2012-08-29 DIAGNOSIS — J309 Allergic rhinitis, unspecified: Secondary | ICD-10-CM | POA: Diagnosis not present

## 2012-08-29 DIAGNOSIS — J449 Chronic obstructive pulmonary disease, unspecified: Secondary | ICD-10-CM | POA: Diagnosis not present

## 2012-08-29 DIAGNOSIS — E782 Mixed hyperlipidemia: Secondary | ICD-10-CM | POA: Diagnosis not present

## 2012-09-04 DIAGNOSIS — J449 Chronic obstructive pulmonary disease, unspecified: Secondary | ICD-10-CM | POA: Diagnosis not present

## 2012-09-04 DIAGNOSIS — C3490 Malignant neoplasm of unspecified part of unspecified bronchus or lung: Secondary | ICD-10-CM | POA: Diagnosis not present

## 2012-09-04 DIAGNOSIS — E782 Mixed hyperlipidemia: Secondary | ICD-10-CM | POA: Diagnosis not present

## 2012-09-04 DIAGNOSIS — Z1331 Encounter for screening for depression: Secondary | ICD-10-CM | POA: Diagnosis not present

## 2012-09-04 DIAGNOSIS — M810 Age-related osteoporosis without current pathological fracture: Secondary | ICD-10-CM | POA: Diagnosis not present

## 2012-09-04 DIAGNOSIS — Z01419 Encounter for gynecological examination (general) (routine) without abnormal findings: Secondary | ICD-10-CM | POA: Diagnosis not present

## 2012-09-04 DIAGNOSIS — Z0000 Encounter for general adult medical examination without abnormal findings: Secondary | ICD-10-CM | POA: Diagnosis not present

## 2012-09-04 DIAGNOSIS — E049 Nontoxic goiter, unspecified: Secondary | ICD-10-CM | POA: Diagnosis not present

## 2012-09-04 DIAGNOSIS — K219 Gastro-esophageal reflux disease without esophagitis: Secondary | ICD-10-CM | POA: Diagnosis not present

## 2012-09-11 ENCOUNTER — Ambulatory Visit
Admission: RE | Admit: 2012-09-11 | Discharge: 2012-09-11 | Disposition: A | Discharge status: Home / Self Care | Payer: Medicare Other | Source: Ambulatory Visit | Attending: Family Medicine | Admitting: Family Medicine

## 2012-09-11 DIAGNOSIS — E049 Nontoxic goiter, unspecified: Secondary | ICD-10-CM

## 2012-09-11 DIAGNOSIS — E042 Nontoxic multinodular goiter: Secondary | ICD-10-CM | POA: Diagnosis not present

## 2012-09-15 ENCOUNTER — Telehealth: Payer: Self-pay | Admitting: Internal Medicine

## 2012-09-15 NOTE — Telephone Encounter (Signed)
I spoke with pt. She stated on her her AVS it stated she had pending order for ONO and t expired in June. I advised her it was an old order and she had already had this done. Nothing further was needed

## 2012-10-19 DIAGNOSIS — Z803 Family history of malignant neoplasm of breast: Secondary | ICD-10-CM | POA: Diagnosis not present

## 2012-10-19 DIAGNOSIS — Z1231 Encounter for screening mammogram for malignant neoplasm of breast: Secondary | ICD-10-CM | POA: Diagnosis not present

## 2012-11-29 ENCOUNTER — Ambulatory Visit (INDEPENDENT_AMBULATORY_CARE_PROVIDER_SITE_OTHER): Payer: Medicare Other | Admitting: Internal Medicine

## 2012-11-29 ENCOUNTER — Encounter: Payer: Self-pay | Admitting: Internal Medicine

## 2012-11-29 VITALS — BP 124/60 | HR 86 | Temp 99.7°F | Ht 59.0 in | Wt 126.2 lb

## 2012-11-29 DIAGNOSIS — J449 Chronic obstructive pulmonary disease, unspecified: Secondary | ICD-10-CM | POA: Diagnosis not present

## 2012-11-29 DIAGNOSIS — C342 Malignant neoplasm of middle lobe, bronchus or lung: Secondary | ICD-10-CM

## 2012-11-29 NOTE — Patient Instructions (Addendum)
#  COPD (very severe copd)  - copd is stable - - continue inhalers as before and oxygen with exertion and sleep - we discuissed tudorza instead of atrovent but decided to hold off - flu shot in fall  #Cancer and Rt rib fracture  - will watch out for ct scan next month   #Followup - 6 months with CAT score at followup

## 2012-11-29 NOTE — Assessment & Plan Note (Signed)
#  COPD (very severe copd)  - copd is stable - - continue inhalers as before and oxygen with exertion and sleep - we discuissed tudorza instead of atrovent but decided to hold off - flu shot in fall

## 2012-11-29 NOTE — Assessment & Plan Note (Signed)
RML lateral segment 8-60mm nodule subpleural - new since 2008 as of July 2012. Perists oct 2010. Serum lung cancer antigen test positive in July 2012. High pre-test for NSCLC. S/p bx nov 2012: NSCLC. PET c/w STage 1. Consensus is stage 1A at [facility]. S/p XRT. FU CT 07/22/11 shows improvement. Nov 2013 shows consolidation; likely post XRT change.  #Cancer and Rt rib fracture  - will watch out for ct scan next month   #Followup - 6 months with CAT score at followup

## 2012-11-29 NOTE — Progress Notes (Signed)
Subjective:    Patient ID: Heather Barnes, female    DOB: 06/20/1933, 79 y.o.   MRN: [id number]  HPI 79 year old ex 20 pack  Smoker quit 2000. REferred for severe dyspnea and pulmonary nodule.  OV 02/17/11: Followup COPD/dyspnea after PFT, pulmonary nodule and chest pain (atypical). Since last visit chest pain resolved. Finds o2and inhalers helps. No new complaints. She is here to discuss pft results and serum oncimmune test results. PFTs show gold stage 3 copd - fev1 0.6L/43%, ratio 44, dlco 5/34%. SErum onciummne test shows 1 antigen positive for lung cancer (highly specific test with high test positive predictive value). Her next ct scan is  Due mid-oct 2012    OV 04/23/2011: Fu after lung bx. She had PET - nodule was hot. No other uptake (discussed at [facility] prior to bx as well). MTOC feeling (see phone note) was not a surgical candidate for lobectomy but possibly for wedge resection with seeds. Bx 04/14/11 - NSCLC. Tolerated bx well. Post bx CXR showed local hge but not ptx. Immediate post bx phase well but last 3 days having early morning mild, small amount of hemoptysis (old dried blood). Not present rest of day. Also, same time  New nasal drainage that is clear. Denies increased, cough, wheeze, edema, chest pain, symcope. Walking sats 185 feet x 3 laps; pulse ox lowest was 90%. Spirometry shows: Fev1 today: 0.58L/33%; and almost gold stage 4 copd. CXR c/w  post biopsy changes and looks fairly clear. rec  06/11/2011 f/u ov/Wert cc increased sob and need for saba over baseline assoc with nasal congestion and hacking cough, white mucus, using lots of cough drops and on fosfamax q Saturday. No overt hb, no sinus pain or purulent drainage  Sleeping ok without nocturnal  or early am exacerbation  of respiratory  c/o's or need for noct saba. Also denies any obvious fluctuation of symptoms with weather or environmental changes or other aggravating or alleviating factors except as outlined above   ROS   At present neg for  any significant sore throat, dysphagia, itching, sneezing,  nasal congestion or excess/ purulent secretions,  fever, chills, sweats, unintended wt loss, pleuritic or exertional cp, hempoptysis, orthopnea pnd or leg swelling.  Also denies presyncope, palpitations, heartburn, abdominal pain, nausea, vomiting, diarrhea  or change in bowel or urinary habits, dysuria,hematuria,  rash, arthralgias, visual complaints, headache, numbness weakness or ataxia.     OV 07/22/2011  Followup  Gold stage 3-4 COPD and Rt lung nodule - NSCLC, s.p curative XRT by Dr Michell Heinrich  Overall well and stable. Has recovered from AECOPD and back to baseline. Says only main issue is that fatigue with exeriton of 20-30 feet and this is asosciated with desaturation and tachycardia (hr 120s).  Lot of questions on expectations. Wants to go back to rehab. Today walking 185 feet x 3 laps: resting HR 91 with pulse ox 92%. AT end of 3 laps HR 112 with lowest pulse ox 86%. Denies chest pain. Recollects normal stress test years ago.   #COPD and shortness of breath and fatigue  - copd is stable  - fatigue is due to copd and recent cancer v cancer treatment  - nurse will re-refer you to rehab  - continue inhalers as before and oxygen with exertion and sleep  - if you develop chest pain let us know  -at folllowup will check TSH  #thrush  - there is yeast in your mouth and your voice is hoarse which means it  could be in your throat too  - take diflucan tablet 100mg  daily x 5 days  -- rinse your mouth after inhaler use  - if it comes back, we have to consider some other inhaler  #Cancer  - this is improved on CT scan 07/12/11.  - I will let Dr Michell Heinrich know who plans to see you in august  - please chck with Dr Michell Heinrich if ok to have mammogram  #Followup  - 3 months with spirometry at followup  - come sooner if there are problems   OV 11/04/2011 Followup cancer fatigue and oral thrush and copd (gold stage  4)  Cancer fatigue nearly resolved after attending rehab; she is very emoptional about the dramatic improvement due to rehab. She felt she lost hope and now has regained it. Oral thrush has resolved. In terms of copd, feels she is stable without worsening dyspnea or cough. FEv1 today 0.61L/36%, Ratio 45 and at basline gold stage 4 copd. CAT score 23 and reflects high copd burden   Past, Family, Social reviewed: no change since last visit  REC #fatigue  - this was due to copd and cancer and radiation and deconditioning  - glad you are much better with rehab  - continue rehab  #COPD (very severe copd)  - copd is stable  - - continue inhalers as before and oxygen with exertion and sleep  - we will set up walk test and overnight pulse ox test to see if you still need oxygen or not  #t#Cancer  - this is improved on CT scan 07/12/11.  - please keep up followup with Dr Michell Heinrich in July 2013  #Followup  - 6 months with CAT score at followup     OV 05/15/2012  Followup COPD (very severe), fatigue and lung cancer surveillance  FAtigue: there is some fatigue if at all but nearly nowhere as bad as before. She keeps up with exercise  COPD: Stable disease. CAT score (below) is 21 and improved from 23. She is allergic to spiriva so manages on symbicort. Wants refills  Lung cancer: she is s/p XRT. She had CT 05/01/12 and to me the nodule looks more consolidated likely reflective of consolidative process post radiation. There is description of new rib fracture associated with it. Per patient she felf sharp pain during dye injection in july2013 CT and since then in the local area has had pain that has slowly improved. Apparently few months ago CXR by PMD did not show fracture but she has always been convinced she has fracture. This Nov 2013 CT has validated her feeling. She is content with warm pads. I suspect this is post xrt fractue but radiologist are questioning tumor invasion. I will check with Dr  Michell Heinrich    CT chest 05/01/12 IMPRESSION:  1. Fracture of the right eighth rib laterally is near the original  right middle lobe nodule and could reflect a pathologic fracture.  No associated lytic lesion identified.  2. Subpleural density in the right middle lobe adjacent to the rib  fracture, without evidence of residual or recurrent mass.  3. No evidence of metastatic disease  Original Report Authenticated By: Carey Bullocks, M   #fatigue  - this was due to copd and cancer and radiation and deconditioning  - glad this is not much of a problem  - continue regular exercise  #COPD (very severe copd)  - copd is stable  - - continue inhalers as before and oxygen with exertion and sleep  #  Cancer and Rt rib fracture  - the rib fractue is likely due to radiation and underlying osteoporosis; continue current pain management of warm pads  - the cancer appears stable without spread on CT Nov 2013 ; the changes seen are due to radiation I think but will have Dr Michell Heinrich double check to make sure I am right  #Followup  - 6 months with CAT score at followup   OV 11/29/2012  FU COPD and lung cancer s/p XRT  Some summer increase dyonea. Otherwise stable. CT due next month by Dr Betsy Coder. COPD CAT score is 28 and likely reflects summer dyspnea. No hx of aecopd   CAT COPD Symptom and Quality of Life Score ([employer]ith kline trademark)   10/3011 05/15/2012 3 11/29/2012   Never Cough -> Cough all the time 2 3 3   No phlegm in chest -> Chest is full of phlegm 3 3 2   No chest tightness -> Chest feels very tight 2 0 3  No dyspnea for 1 flight stairs/hill -> Very dyspneic for 1 flight of stairs 5 5 5   No limitations for ADL at home -> Very limited with ADL at home 3 3 4   Confident leaving home -> Not at all confident leaving home 3 4 5   Sleep soundly -> Do not sleep soundly because of lung condition 2 3 3   Lots of Energy -> No energy at all 3 0 3  TOTAL Score (max 40)  23 21 28   o2   desat  to 85% walking 50 feet on RA  BMI   Body mass index is 25.48 kg/(m^2).      Review of Systems  Constitutional: Negative for fever and unexpected weight change.  HENT: Negative for ear pain, nosebleeds, congestion, sore throat, rhinorrhea, sneezing, trouble swallowing, dental problem, postnasal drip and sinus pressure.   Eyes: Negative for redness and itching.  Respiratory: Positive for shortness of breath. Negative for cough, chest tightness and wheezing.   Cardiovascular: Negative for palpitations and leg swelling.  Gastrointestinal: Negative for nausea and vomiting.  Genitourinary: Negative for dysuria.  Musculoskeletal: Negative for joint swelling.  Skin: Negative for rash.  Neurological: Negative for headaches.  Hematological: Does not bruise/bleed easily.  Psychiatric/Behavioral: Negative for dysphoric mood. The patient is not nervous/anxious.        Objective:   Physical Exam Vitals reviewed. Constitutional: She is oriented to person, place, and time. She appears well-developed and well-nourished. No distress.  HENT:  Head: Normocephalic and atraumatic.  Right Ear: External ear normal.  Left Ear: normal Mouth/Throat: Oropharynx is clear and moist.   Eyes: Conjunctivae and EOM are normal. Pupils are equal, round, and reactive to light. Right eye exhibits no discharge. Left eye exhibits no discharge. No scleral icterus.  Neck: Normal range of motion. Neck supple. No JVD present. No tracheal deviation present. No thyromegaly present.  Cardiovascular: Normal rate, regular rhythm, normal heart sounds and intact distal pulses.  Exam reveals no gallop and no friction rub.   No murmur heard. Pulmonary/Chest: Effort normal and breath sounds normal. No respiratory distress. She has no wheezes. She has no rales. She exhibits no tenderness.       barrell chest expiraiton prolonged. No wheeze. No purse lip breathing re  Abdominal: Soft. Bowel sounds are normal. She exhibits no  distension and no mass. There is no tenderness. There is no rebound and no guarding.  Musculoskeletal: Normal range of motion. She exhibits no edema and no tenderness.  Lymphadenopathy:  She has no cervical adenopathy.  Neurological: She is alert and oriented to person, place, and time. She has normal reflexes. No cranial nerve deficit. She exhibits normal muscle tone. Coordination normal.  Skin: Skin is warm and dry. No rash noted. She is not diaphoretic. No erythema. No pallor.  Psychiatric: She has a normal mood and affect. Her behavior is normal. Judgment and thought content normal.             Assessment & Plan:

## 2012-12-21 ENCOUNTER — Ambulatory Visit
Admission: RE | Admit: 2012-12-21 | Discharge: 2012-12-21 | Disposition: A | Discharge status: Home / Self Care | Payer: Medicare Other | Source: Ambulatory Visit | Attending: Radiation Oncology | Admitting: Radiation Oncology

## 2012-12-21 DIAGNOSIS — C342 Malignant neoplasm of middle lobe, bronchus or lung: Secondary | ICD-10-CM

## 2012-12-21 LAB — BUN AND CREATININE (CC13): Creatinine: 0.8 mg/dL (ref 0.6–1.1)

## 2012-12-25 ENCOUNTER — Encounter (HOSPITAL_COMMUNITY): Payer: Self-pay

## 2012-12-25 ENCOUNTER — Ambulatory Visit (HOSPITAL_COMMUNITY)
Admission: RE | Admit: 2012-12-25 | Discharge: 2012-12-25 | Disposition: A | Discharge status: Home / Self Care | Payer: Medicare Other | Source: Ambulatory Visit | Attending: Radiation Oncology | Admitting: Radiation Oncology

## 2012-12-25 DIAGNOSIS — I2510 Atherosclerotic heart disease of native coronary artery without angina pectoris: Secondary | ICD-10-CM | POA: Insufficient documentation

## 2012-12-25 DIAGNOSIS — N289 Disorder of kidney and ureter, unspecified: Secondary | ICD-10-CM | POA: Insufficient documentation

## 2012-12-25 DIAGNOSIS — C3490 Malignant neoplasm of unspecified part of unspecified bronchus or lung: Secondary | ICD-10-CM | POA: Diagnosis not present

## 2012-12-25 DIAGNOSIS — K7689 Other specified diseases of liver: Secondary | ICD-10-CM | POA: Diagnosis not present

## 2012-12-25 DIAGNOSIS — J438 Other emphysema: Secondary | ICD-10-CM | POA: Diagnosis not present

## 2012-12-25 DIAGNOSIS — C342 Malignant neoplasm of middle lobe, bronchus or lung: Secondary | ICD-10-CM

## 2012-12-25 DIAGNOSIS — Z923 Personal history of irradiation: Secondary | ICD-10-CM | POA: Insufficient documentation

## 2012-12-25 MED ORDER — IOHEXOL 300 MG/ML  SOLN
80.0000 mL | Freq: Once | INTRAMUSCULAR | Status: AC | PRN
  Administered 2012-12-25: 80 mL via INTRAVENOUS

## 2012-12-28 ENCOUNTER — Ambulatory Visit: Payer: Medicare Other | Admitting: Radiation Oncology

## 2012-12-29 ENCOUNTER — Ambulatory Visit: Payer: Medicare Other | Admitting: Radiation Oncology

## 2013-01-03 ENCOUNTER — Encounter: Payer: Self-pay | Admitting: Radiation Oncology

## 2013-01-11 ENCOUNTER — Ambulatory Visit
Admission: RE | Admit: 2013-01-11 | Discharge: 2013-01-11 | Disposition: A | Discharge status: Home / Self Care | Payer: Medicare Other | Source: Ambulatory Visit | Attending: Radiation Oncology | Admitting: Radiation Oncology

## 2013-01-11 ENCOUNTER — Encounter: Payer: Self-pay | Admitting: Radiation Oncology

## 2013-01-11 VITALS — BP 159/66 | HR 101 | Temp 98.4°F | Resp 22 | Wt 125.3 lb

## 2013-01-11 DIAGNOSIS — C342 Malignant neoplasm of middle lobe, bronchus or lung: Secondary | ICD-10-CM | POA: Diagnosis not present

## 2013-01-11 HISTORY — DX: Personal history of irradiation: Z92.3

## 2013-01-11 NOTE — Progress Notes (Addendum)
Follow up right middle lung 05/24/11-06-02-11 Last CT chest 12/25/12, patient on 2 liters n/c oxygen sats=93%, sob with exertion , no nausea, no coughing, appetite good, tired and fatigued"My COPD is making me tired" stated patient, no difficulty swallowing or chewing food/fluids Sees Dr.Ramaswamy nthis December 2014 2:33 PM

## 2013-01-11 NOTE — Progress Notes (Signed)
   Department of Radiation Oncology  Phone:  [phone] Fax:        [phone]   Name: Heather Barnes MRN: [id number]  DOB: Dec 25, 1933  Date: 01/11/2013  Follow Up Visit Note  Diagnosis: T1 N0 non-small cell lung cancer of the right middle lobe  Summary and Interval since last radiation: SBRT to a total dose of 60 gray completed 06/02/2011  Interval History: Heather Barnes presents today for routine followup.  She is almost 2 years out from treatment. She continues on oxygen. Interestingly she does not need oxygen at night. She has an appointment to see Dr. Marchelle Gearing in December. She had a CT scan 2 weeks ago. This showed no evidence of recurrent disease. Her mediastinal and hilar lymph nodes were not enlarged. She had stable cyst within her liver and her kidney. I personally reviewed the scans.  Allergies:  Allergies  Allergen Reactions  . Codeine Other (See Comments)    Hyperactivity,crying  . Tiotropium Bromide Monohydrate     rash    Medications:  Current Outpatient Prescriptions  Medication Sig Dispense Refill  . albuterol (PROAIR HFA) 108 (90 BASE) MCG/ACT inhaler Inhale 2 puffs into the lungs every 6 (six) hours as needed.        [name] alendronate (FOSAMAX) 70 MG tablet Take 70 mg by mouth every 7 (seven) days. Take with a full glass of water on an empty stomach.       [name] atorvastatin (LIPITOR) 40 MG tablet Daily.      . budesonide-formoterol (SYMBICORT) 160-4.5 MCG/ACT inhaler Inhale 2 puffs into the lungs 2 (two) times daily.        . cetirizine (ZYRTEC) 10 MG tablet Take 10 mg by mouth daily.        . Cholecalciferol (VITAMIN D) 2000 UNITS CAPS Take 2 capsules by mouth daily.        . fluticasone (FLONASE) 50 MCG/ACT nasal spray Place 2 sprays into the nose as needed.       [name] ipratropium (ATROVENT HFA) 17 MCG/ACT inhaler Inhale 2 puffs into the lungs every 6 (six) hours.        . Multiple Vitamins-Minerals (MULTIVITAMIN WITH MINERALS) tablet Take 1 tablet by mouth daily.         [name] SINGULAIR 10 MG tablet Take 1 tablet by mouth Daily.       No current facility-administered medications for this encounter.    Physical Exam:  Filed Vitals:   01/11/13 1429  BP: 159/66  Pulse: 101  Temp: 98.4 F (36.9 C)  Resp: 22   pleasant female with a nasal cannula in place. All are going to x3. Normal respiratory effort.  IMPRESSION: Heather Barnes is a 79 y.o. female status post SBRT for a right middle lobe nodule with no evidence of disease  PLAN:  Heather Barnes looks good. I've scheduled her for CT in 6 months. At that point she can probably go to yearly followup CT scans to complete 5 years. I've encouraged her to contact me with any questions. She's agree to do so.    Lurline Hare, MD

## 2013-01-15 ENCOUNTER — Telehealth: Payer: Self-pay | Admitting: *Deleted

## 2013-01-15 NOTE — Telephone Encounter (Signed)
CALLED PATIENT TO INFORM OF TEST AND FU VISIT, SPOKE WITH PATIENT AND SHE IS AWARE OF THESE APPTS. 

## 2013-03-05 DIAGNOSIS — Z23 Encounter for immunization: Secondary | ICD-10-CM | POA: Diagnosis not present

## 2013-03-19 DIAGNOSIS — E782 Mixed hyperlipidemia: Secondary | ICD-10-CM | POA: Diagnosis not present

## 2013-03-19 DIAGNOSIS — C3490 Malignant neoplasm of unspecified part of unspecified bronchus or lung: Secondary | ICD-10-CM | POA: Diagnosis not present

## 2013-03-19 DIAGNOSIS — E049 Nontoxic goiter, unspecified: Secondary | ICD-10-CM | POA: Diagnosis not present

## 2013-03-19 DIAGNOSIS — Z79899 Other long term (current) drug therapy: Secondary | ICD-10-CM | POA: Diagnosis not present

## 2013-03-19 DIAGNOSIS — K219 Gastro-esophageal reflux disease without esophagitis: Secondary | ICD-10-CM | POA: Diagnosis not present

## 2013-03-19 DIAGNOSIS — M810 Age-related osteoporosis without current pathological fracture: Secondary | ICD-10-CM | POA: Diagnosis not present

## 2013-03-19 DIAGNOSIS — J449 Chronic obstructive pulmonary disease, unspecified: Secondary | ICD-10-CM | POA: Diagnosis not present

## 2013-03-19 DIAGNOSIS — J309 Allergic rhinitis, unspecified: Secondary | ICD-10-CM | POA: Diagnosis not present

## 2013-04-03 ENCOUNTER — Telehealth: Payer: Self-pay | Admitting: Internal Medicine

## 2013-04-03 NOTE — Telephone Encounter (Signed)
Called spoke with patient who reported that she is under some additional stress with her spouse being in ICU and will be transferred to Hospice services soon.  Pt is requesting to increase her liter flow on her O2 from 2lpm to 3lpm due to decreased sats ranging from 80-86% on 2lpm and the highest she has seen in 90% on 2lpm.  She is using her Proair more often and her diastolic readings are elevated with it being 110 this morning.    Dr Marchelle Gearing please advise, thanks. **page sent

## 2013-04-03 NOTE — Telephone Encounter (Signed)
Sorry to hear -- please let her know. What is name of her spouse? Which ICU?  Also, she can increase o2 but I am more concerned why o2 is less. Need to know if stress is resulting in AECOPD or CHF. She should come in [place] see one one of us/np. If she has any 2 of following : increased cough, dyspnea, change in sputum color, change in sputum volue I can call inn phine Rx for AECOPD without her havnig to come in   Dr. Kalman Shan, M.D., [facility].C.P Pulmonary and Critical Care Medicine Staff Physician [place] [facility] [place] [facility] Pager: [phone], If no answer or between  15:00h - 7:00h: call [phone]  04/03/2013 12:07 PM

## 2013-04-03 NOTE — Telephone Encounter (Signed)
Called cell #, NA, no voicemail. Called home# and NA, no voicemail. Carron Curie, CMA

## 2013-04-04 NOTE — Telephone Encounter (Signed)
Ok thanks. Use 3L when  Visiting husband  Dr. Kalman Shan, M.D., [facility]C.P Pulmonary and Critical Care Medicine Staff Physician [place] [facility] [place] [facility] Pager: [phone], If no answer or between  15:00h - 7:00h: call [phone]  04/04/2013 9:58 AM

## 2013-04-04 NOTE — Telephone Encounter (Signed)
I spoke with the pt. Her husband was moved to inpatient hospice yesterday. She denies any of the below symptoms. She states she only feels like she needs more oxygen when she is visiting her husband and she gets very upset and anxious each time. She states she only increase to 3 liters at this time. I advised if she develops any of the below symptoms to please call for an appt. Carron Curie, CMA

## 2013-06-11 ENCOUNTER — Telehealth: Payer: Self-pay | Admitting: Internal Medicine

## 2013-06-11 ENCOUNTER — Encounter: Payer: Self-pay | Admitting: Internal Medicine

## 2013-06-11 ENCOUNTER — Ambulatory Visit (INDEPENDENT_AMBULATORY_CARE_PROVIDER_SITE_OTHER): Payer: Medicare Other | Admitting: Internal Medicine

## 2013-06-11 VITALS — BP 122/70 | HR 99 | Ht 59.0 in | Wt 118.4 lb

## 2013-06-11 DIAGNOSIS — B370 Candidal stomatitis: Secondary | ICD-10-CM

## 2013-06-11 DIAGNOSIS — J441 Chronic obstructive pulmonary disease with (acute) exacerbation: Secondary | ICD-10-CM

## 2013-06-11 DIAGNOSIS — J449 Chronic obstructive pulmonary disease, unspecified: Secondary | ICD-10-CM | POA: Diagnosis not present

## 2013-06-11 MED ORDER — PREDNISONE 10 MG PO TABS: ORAL_TABLET | ORAL | Status: DC

## 2013-06-11 MED ORDER — NYSTATIN 100000 UNIT/ML MT SUSP: 5.0000 mL | Freq: Four times a day (QID) | OROMUCOSAL | Status: DC

## 2013-06-11 MED ORDER — DOXYCYCLINE HYCLATE 100 MG PO TABS: 100.0000 mg | ORAL_TABLET | Freq: Two times a day (BID) | ORAL | Status: DC

## 2013-06-11 NOTE — Progress Notes (Signed)
Subjective:    Patient ID: Heather Barnes, female    DOB: Oct 10, 1933, 80 y.o.   MRN: [id number]  HPI  80 year old ex 20 pack  Smoker quit 2000. REferred for severe dyspnea and pulmonary nodule.  OV 02/17/11: Followup COPD/dyspnea after PFT, pulmonary nodule and chest pain (atypical). Since last visit chest pain resolved. Finds o2and inhalers helps. No new complaints. She is here to discuss pft results and serum oncimmune test results. PFTs show gold stage 3 copd - fev1 0.6L/43%, ratio 44, dlco 5/34%. SErum onciummne test shows 1 antigen positive for lung cancer (highly specific test with high test positive predictive value). Her next ct scan is  Due mid-oct 2012    OV 04/23/2011: Fu after lung bx. She had PET - nodule was hot. No other uptake (discussed at [facility] prior to bx as well). Thornton feeling (see phone note) was not a surgical candidate for lobectomy but possibly for wedge resection with seeds. Bx 04/14/11 - NSCLC. Tolerated bx well. Post bx CXR showed local hge but not ptx. Immediate post bx phase well but last 3 days having early morning mild, small amount of hemoptysis (old dried blood). Not present rest of day. Also, same time  New nasal drainage that is clear. Denies increased, cough, wheeze, edema, chest pain, symcope. Walking sats 185 feet x 3 laps; pulse ox lowest was 90%. Spirometry shows: Fev1 today: 0.58L/33%; and almost gold stage 4 copd. CXR c/w  post biopsy changes and looks fairly clear. rec  06/11/2011 f/u ov/Wert cc increased sob and need for saba over baseline assoc with nasal congestion and hacking cough, white mucus, using lots of cough drops and on fosfamax q Saturday. No overt hb, no sinus pain or purulent drainage  Sleeping ok without nocturnal  or early am exacerbation  of respiratory  c/o's or need for noct saba. Also denies any obvious fluctuation of symptoms with weather or environmental changes or other aggravating or alleviating factors except as outlined above    ROS  At present neg for  any significant sore throat, dysphagia, itching, sneezing,  nasal congestion or excess/ purulent secretions,  fever, chills, sweats, unintended wt loss, pleuritic or exertional cp, hempoptysis, orthopnea pnd or leg swelling.  Also denies presyncope, palpitations, heartburn, abdominal pain, nausea, vomiting, diarrhea  or change in bowel or urinary habits, dysuria,hematuria,  rash, arthralgias, visual complaints, headache, numbness weakness or ataxia.     OV 07/22/2011  Followup  Gold stage 3-4 COPD and Rt lung nodule - NSCLC, s.p curative XRT by Dr Pablo Ledger  Overall well and stable. Has recovered from AECOPD and back to baseline. Says only main issue is that fatigue with exeriton of 20-30 feet and this is asosciated with desaturation and tachycardia (hr 120s).  Lot of questions on expectations. Wants to go back to rehab. Today walking 185 feet x 3 laps: resting HR 91 with pulse ox 92%. AT end of 3 laps HR 112 with lowest pulse ox 86%. Denies chest pain. Recollects normal stress test years ago.   #COPD and shortness of breath and fatigue  - copd is stable  - fatigue is due to copd and recent cancer v cancer treatment  - nurse will re-refer you to rehab  - continue inhalers as before and oxygen with exertion and sleep  - if you develop chest pain let us know  -at folllowup will check TSH  #thrush  - there is yeast in your mouth and your voice is hoarse which  means it could be in your throat too  - take diflucan tablet 100mg  daily x 5 days  -- rinse your mouth after inhaler use  - if it comes back, we have to consider some other inhaler  #Cancer  - this is improved on CT scan 07/12/11.  - I will let Dr Pablo Ledger know who plans to see you in august  - please chck with Dr Pablo Ledger if ok to have mammogram  #Followup  - 3 months with spirometry at followup  - come sooner if there are problems   OV 11/04/2011 Followup cancer fatigue and oral thrush and copd (gold  stage 4)  Cancer fatigue nearly resolved after attending rehab; she is very emoptional about the dramatic improvement due to rehab. She felt she lost hope and now has regained it. Oral thrush has resolved. In terms of copd, feels she is stable without worsening dyspnea or cough. FEv1 today 0.61L/36%, Ratio 45 and at basline gold stage 4 copd. CAT score 23 and reflects high copd burden   Past, Family, Social reviewed: no change since last visit  REC #fatigue  - this was due to copd and cancer and radiation and deconditioning  - glad you are much better with rehab  - continue rehab  #COPD (very severe copd)  - copd is stable  - - continue inhalers as before and oxygen with exertion and sleep  - we will set up walk test and overnight pulse ox test to see if you still need oxygen or not  #t#Cancer  - this is improved on CT scan 07/12/11.  - please keep up followup with Dr Pablo Ledger in July 2013  #Followup  - 6 months with CAT score at followup     OV 05/15/2012  Followup COPD (very severe), fatigue and lung cancer surveillance  FAtigue: there is some fatigue if at all but nearly nowhere as bad as before. She keeps up with exercise  COPD: Stable disease. CAT score (below) is 21 and improved from 23. She is allergic to spiriva so manages on symbicort. Wants refills  Lung cancer: she is s/p XRT. She had CT 05/01/12 and to me the nodule looks more consolidated likely reflective of consolidative process post radiation. There is description of new rib fracture associated with it. Per patient she felf sharp pain during dye injection in [place] and since then in the local area has had pain that has slowly improved. Apparently few months ago CXR by PMD did not show fracture but she has always been convinced she has fracture. This Nov 2013 CT has validated her feeling. She is content with warm pads. I suspect this is post xrt fractue but radiologist are questioning tumor invasion. I will check  with Dr Pablo Ledger    CT chest 05/01/12 IMPRESSION:  1. Fracture of the right eighth rib laterally is near the original  right middle lobe nodule and could reflect a pathologic fracture.  No associated lytic lesion identified.  2. Subpleural density in the right middle lobe adjacent to the rib  fracture, without evidence of residual or recurrent mass.  3. No evidence of metastatic disease  Original Report Authenticated By: Richardean Sale, M   #fatigue  - this was due to copd and cancer and radiation and deconditioning  - glad this is not much of a problem  - continue regular exercise  #COPD (very severe copd)  - copd is stable  - - continue inhalers as before and oxygen with exertion and  sleep  #Cancer and Rt rib fracture  - the rib fractue is likely due to radiation and underlying osteoporosis; continue current pain management of warm pads  - the cancer appears stable without spread on CT Nov 2013 ; the changes seen are due to radiation I think but will have Dr Pablo Ledger double check to make sure I am right  #Followup  - 6 months with CAT score at followup   OV 11/29/2012  FU COPD and lung cancer s/p XRT  Some summer increase dyonea. Otherwise stable. CT due next month by Dr Staci Righter. COPD CAT score is 28 and likely reflects summer dyspnea. No hx of aecopd    OV 06/11/2013  FU Gold stage 4 COPD  Husband died fall August 28, 2012 due to pneumonia, VDRF. Sinced then grieving. Also, cleaning up dusty files since then but ended it 8 days ago. Then 3 days ago developed mild worsening cough associated with worsening dyspnea and chest tightness but no change in sputum volume or color. No ass. Edema, hemoptysis, COmpliant with symbicort and atrovent and singulair and o2  Lung cancer: has fu CT and office visit with rad onc Dr Pablo Ledger in a month     CAT COPD Symptom and Quality of Life Score ([employer])   10/3011 05/15/2012 3 11/29/2012  06/11/2013   Never Cough -> Cough  all the time 2 3 3    No phlegm in chest -> Chest is full of phlegm 3 3 2    No chest tightness -> Chest feels very tight 2 0 3   No dyspnea for 1 flight stairs/hill -> Very dyspneic for 1 flight of stairs 5 5 5    No limitations for ADL at home -> Very limited with ADL at home 3 3 4    Confident leaving home -> Not at all confident leaving home 3 4 5    Sleep soundly -> Do not sleep soundly because of lung condition 2 3 3    Lots of Energy -> No energy at all 3 0 3   TOTAL Score (max 40)  23 21 28    o2   desat to 85% walking 50 feet on RA   BMI   Body mass index is 25.48 kg/(m^2).  Body mass index is 23.9 kg/(m^2).      Review of Systems  Constitutional: Negative for fever and unexpected weight change.  HENT: Positive for congestion and postnasal drip. Negative for dental problem, ear pain, nosebleeds, rhinorrhea, sinus pressure, sneezing, sore throat and trouble swallowing.   Eyes: Negative for redness and itching.  Respiratory: Positive for cough, chest tightness and shortness of breath. Negative for wheezing.   Cardiovascular: Negative for palpitations and leg swelling.  Gastrointestinal: Negative for nausea and vomiting.  Genitourinary: Negative for dysuria.  Musculoskeletal: Negative for joint swelling.  Skin: Negative for rash.  Neurological: Negative for headaches.  Hematological: Does not bruise/bleed easily.  Psychiatric/Behavioral: Negative for dysphoric mood. The patient is not nervous/anxious.        Objective:   Physical Exam  Vitals reviewed. Constitutional: She is oriented to person, place, and time. She appears well-developed and well-nourished. No distress.  HENT:  Head: Normocephalic and atraumatic.  Right Ear: External ear normal.  Left Ear: normal Mouth/Throat: Oropharynx SHOWS THRUSH ++ Eyes: Conjunctivae and EOM are normal. Pupils are equal, round, and reactive to light. Right eye exhibits no discharge. Left eye exhibits no discharge. No scleral icterus.   Neck: Normal range of motion. Neck supple. No JVD present. No tracheal  deviation present. No thyromegaly present.  Cardiovascular: Normal rate, regular rhythm, normal heart sounds and intact distal pulses.  Exam reveals no gallop and no friction rub.   No murmur heard. Pulmonary/Chest: Effort normal and breath sounds normal. No respiratory distress. She has no wheezes. She has no rales. She exhibits no tenderness.       barrell chest expiraiton prolonged. No wheeze. No purse lip breathing re  Abdominal: Soft. Bowel sounds are normal. She exhibits no distension and no mass. There is no tenderness. There is no rebound and no guarding.  Musculoskeletal: Normal range of motion. She exhibits no edema and no tenderness.  Lymphadenopathy:    She has no cervical adenopathy.  Neurological: She is alert and oriented to person, place, and time. She has normal reflexes. No cranial nerve deficit. She exhibits normal muscle tone. Coordination normal.  Skin: Skin is warm and dry. No rash noted. She is not diaphoretic. No erythema. No pallor.  Psychiatric: She has a normal mood and affect. Her behavior is normal. Judgment and thought content normal.       Assessment & Plan:

## 2013-06-11 NOTE — Telephone Encounter (Signed)
Forgot to call her in thursh Rx  # Oral thrush - For Oral thrush: Take Suspension (swish and swallow): 500,000 units 4 times/day for 5 days; swish in the mouth and retain for as long as possible (several minutes) before swallowing

## 2013-06-11 NOTE — Telephone Encounter (Signed)
rx sent and pt is aware. Jennifer Castillo, CMA  

## 2013-06-11 NOTE — Patient Instructions (Addendum)
#  COPD (very severe copd)  - copd is in flare up - - continue inhalers as before and oxygen with exertion and sleep  #AECOPD Take doxycycline 100mg  po twice daily x 5 days; take after meals and avoid sunlight Please take prednisone 40 mg x1 day, then 30 mg x1 day, then 20 mg x1 day, then 10 mg x1 day, and then 5 mg x1 day and stop   #Cancer and Rt rib fracture  - per Dr Pablo Ledger with [facility] in FEb 2015  #Thrush # Oral thrush - For Oral thrush: Take Suspension (swish and swallow): 500,000 units 4 times/day for 5 days; swish in the mouth and retain for as long as possible (several minutes) before swallowing   Followup - 6 months with CAT score at followup

## 2013-06-12 DIAGNOSIS — R0602 Shortness of breath: Secondary | ICD-10-CM | POA: Diagnosis not present

## 2013-06-13 ENCOUNTER — Telehealth: Payer: Self-pay | Admitting: Internal Medicine

## 2013-06-13 DIAGNOSIS — J441 Chronic obstructive pulmonary disease with (acute) exacerbation: Secondary | ICD-10-CM | POA: Insufficient documentation

## 2013-06-13 NOTE — Telephone Encounter (Signed)
She has been on prednisone in past and never had this. Need to know if she had  Afib independent of all this. Stress can do this. Too much albtuerol can make people tachycardic and hands shaky. Those are more likely than prednisone  Maybe she should come in and see TP/me this week  Ok to take prednisone today but maybe just half the dose she is supposed to  Dr. Brand Males, M.D., [facility].C.P Pulmonary and Critical Care Medicine Staff Physician [place] [facility] Pager: [phone], If no answer or between  15:00h - 7:00h: call [phone]  06/13/2013 9:44 AM

## 2013-06-13 NOTE — Telephone Encounter (Signed)
Called and spoke with pt and she is aware of MR recs.  She will take the half dose of prednisone today at 6 pm.   She stated that she will try to find a ride for the appt with MR tomorrow but she is aware to call and cancel this appt if she is not able to keep this appt.  She stated that she is going to have a stress free day today.  Nothing further is needed.

## 2013-06-13 NOTE — Assessment & Plan Note (Signed)
   #  Thrush # Oral thrush - For Oral thrush: Take Suspension (swish and swallow): 500,000 units 4 times/day for 5 days; swish in the mouth and retain for as long as possible (several minutes) before swallowing

## 2013-06-13 NOTE — Telephone Encounter (Signed)
Called and spoke with pt and she stated that she was seen on Monday and given prednisone taper.  She stated that she she took the dose on Monday and this made her hands feel shaky.  She stated that she took the Tuesday dose and she stated that her pulse started to race and she was shaking all over.  She stated that she checked her BP and it was elevated.  She ended up using her proair to see if this would help and her pulse kept going up and she called 911.  She stated that they hooked her up to the monitor and did an ekg and BP got up to 197/74 and pulse was up to 150's.   She stated that EMS stayed with her for a while and her BP did come back down.  She stated that since her husband passed in October she has been under some stress.  She is  Not sure if this was a panic attack or not.  Pt is wanting to know if she should finish out the prednisone.  She is to take the 2 tabs of 10 mg today .  MR please advise. Thanks  Allergies  Allergen Reactions  . Codeine Other (See Comments)    Hyperactivity,crying  . Tiotropium Bromide Monohydrate     rash     Current Outpatient Prescriptions on File Prior to Visit  Medication Sig Dispense Refill  . albuterol (PROAIR HFA) 108 (90 BASE) MCG/ACT inhaler Inhale 2 puffs into the lungs every 6 (six) hours as needed.        [name] alendronate (FOSAMAX) 70 MG tablet Take 70 mg by mouth every 7 (seven) days. Take with a full glass of water on an empty stomach.       [name] atorvastatin (LIPITOR) 40 MG tablet Daily.      . budesonide-formoterol (SYMBICORT) 160-4.5 MCG/ACT inhaler Inhale 2 puffs into the lungs 2 (two) times daily.        . cetirizine (ZYRTEC) 10 MG tablet Take 10 mg by mouth daily.        . Cholecalciferol (VITAMIN D) 2000 UNITS CAPS Take 2 capsules by mouth daily.        [name] doxycycline (VIBRA-TABS) 100 MG tablet Take 1 tablet (100 mg total) by mouth 2 (two) times daily.  10 tablet  0  . fluticasone (FLONASE) 50 MCG/ACT nasal spray Place 2 sprays into the nose  as needed.       [name] ipratropium (ATROVENT HFA) 17 MCG/ACT inhaler Inhale 2 puffs into the lungs every 6 (six) hours.        . Multiple Vitamins-Minerals (MULTIVITAMIN WITH MINERALS) tablet Take 1 tablet by mouth daily.        [name] nystatin (MYCOSTATIN) 100000 UNIT/ML suspension Take 5 mLs (500,000 Units total) by mouth 4 (four) times daily. swish in the mouth and retain for several minutes before swallowing  120 mL  0  . predniSONE (DELTASONE) 10 MG tablet Take 4 tabs daily x 1 day, 3 tabs daily x 1 day, 2 tabs daily x 1 day, 1 tab daily x 1 days then 1/2 tablet daily x 1 day, then stop  11 tablet  0  . SINGULAIR 10 MG tablet Take 1 tablet by mouth Daily.       No current facility-administered medications on file prior to visit.

## 2013-06-13 NOTE — Assessment & Plan Note (Signed)
#  Cancer and Rt rib fracture  - per Dr Pablo Ledger with [facility] in FEb 2015

## 2013-06-13 NOTE — Assessment & Plan Note (Signed)
#  COPD (very severe copd)  - copd is in flare up - - continue inhalers as before and oxygen with exertion and sleep  #AECOPD Take doxycycline 100mg  po twice daily x 5 days; take after meals and avoid sunlight Please take prednisone 40 mg x1 day, then 30 mg x1 day, then 20 mg x1 day, then 10 mg x1 day, and then 5 mg x1 day and stop  Followup - 6 months with CAT score at followup

## 2013-06-14 ENCOUNTER — Telehealth: Payer: Self-pay | Admitting: Internal Medicine

## 2013-06-14 ENCOUNTER — Encounter: Payer: Self-pay | Admitting: Internal Medicine

## 2013-06-14 ENCOUNTER — Ambulatory Visit: Payer: Medicare Other | Admitting: Internal Medicine

## 2013-06-14 ENCOUNTER — Ambulatory Visit (INDEPENDENT_AMBULATORY_CARE_PROVIDER_SITE_OTHER): Payer: Medicare Other | Admitting: Internal Medicine

## 2013-06-14 VITALS — BP 152/62 | HR 100 | Ht 59.0 in | Wt 117.8 lb

## 2013-06-14 DIAGNOSIS — B370 Candidal stomatitis: Secondary | ICD-10-CM

## 2013-06-14 DIAGNOSIS — J441 Chronic obstructive pulmonary disease with (acute) exacerbation: Secondary | ICD-10-CM

## 2013-06-14 NOTE — Progress Notes (Signed)
Subjective:    Patient ID: Heather Barnes, female    DOB: 10/30/1933, 80 y.o.   MRN: [id number]  HPI    80 year old ex 20 pack  Smoker quit 2000. REferred for severe dyspnea and pulmonary nodule.  OV 02/17/11: Followup COPD/dyspnea after PFT, pulmonary nodule and chest pain (atypical). Since last visit chest pain resolved. Finds o2and inhalers helps. No new complaints. She is here to discuss pft results and serum oncimmune test results. PFTs show gold stage 3 copd - fev1 0.6L/43%, ratio 44, dlco 5/34%. SErum onciummne test shows 1 antigen positive for lung cancer (highly specific test with high test positive predictive value). Her next ct scan is  Due mid-oct 2012    OV 04/23/2011: Fu after lung bx. She had PET - nodule was hot. No other uptake (discussed at [facility] prior to bx as well). [place] feeling (see phone note) was not a surgical candidate for lobectomy but possibly for wedge resection with seeds. Bx 04/14/11 - NSCLC. Tolerated bx well. Post bx CXR showed local hge but not ptx. Immediate post bx phase well but last 3 days having early morning mild, small amount of hemoptysis (old dried blood). Not present rest of day. Also, same time  New nasal drainage that is clear. Denies increased, cough, wheeze, edema, chest pain, symcope. Walking sats 185 feet x 3 laps; pulse ox lowest was 90%. Spirometry shows: Fev1 today: 0.58L/33%; and almost gold stage 4 copd. CXR c/w  post biopsy changes and looks fairly clear. rec  06/11/2011 f/u ov/Wert cc increased sob and need for saba over baseline assoc with nasal congestion and hacking cough, white mucus, using lots of cough drops and on fosfamax q Saturday. No overt hb, no sinus pain or purulent drainage  Sleeping ok without nocturnal  or early am exacerbation  of respiratory  c/o's or need for noct saba. Also denies any obvious fluctuation of symptoms with weather or environmental changes or other aggravating or alleviating factors except as outlined above      OV 07/22/2011  Followup  Gold stage 3-4 COPD and Rt lung nodule - NSCLC, s.p curative XRT by Dr Pablo Ledger  Overall well and stable. Has recovered from AECOPD and back to baseline. Says only main issue is that fatigue with exeriton of 20-30 feet and this is asosciated with desaturation and tachycardia (hr 120s).  Lot of questions on expectations. Wants to go back to rehab. Today walking 185 feet x 3 laps: resting HR 91 with pulse ox 92%. AT end of 3 laps HR 112 with lowest pulse ox 86%. Denies chest pain. Recollects normal stress test years ago.   #COPD and shortness of breath and fatigue  - copd is stable  - fatigue is due to copd and recent cancer v cancer treatment  - nurse will re-refer you to rehab  - continue inhalers as before and oxygen with exertion and sleep  - if you develop chest pain let us know  -at folllowup will check TSH  #thrush  - there is yeast in your mouth and your voice is hoarse which means it could be in your throat too  - take diflucan tablet 100mg  daily x 5 days  -- rinse your mouth after inhaler use  - if it comes back, we have to consider some other inhaler  #Cancer  - this is improved on CT scan 07/12/11.  - I will let Dr Pablo Ledger know who plans to see you in august  - please  chck with Dr Pablo Ledger if ok to have mammogram  #Followup  - 3 months with spirometry at followup  - come sooner if there are problems   OV 11/04/2011 Followup cancer fatigue and oral thrush and copd (gold stage 4)  Cancer fatigue nearly resolved after attending rehab; she is very emoptional about the dramatic improvement due to rehab. She felt she lost hope and now has regained it. Oral thrush has resolved. In terms of copd, feels she is stable without worsening dyspnea or cough. FEv1 today 0.61L/36%, Ratio 45 and at basline gold stage 4 copd. CAT score 23 and reflects high copd burden   Past, Family, Social reviewed: no change since last visit  REC #fatigue  - this was due  to copd and cancer and radiation and deconditioning  - glad you are much better with rehab  - continue rehab  #COPD (very severe copd)  - copd is stable  - - continue inhalers as before and oxygen with exertion and sleep  - we will set up walk test and overnight pulse ox test to see if you still need oxygen or not  #t#Cancer  - this is improved on CT scan 07/12/11.  - please keep up followup with Dr Pablo Ledger in July 2013  #Followup  - 6 months with CAT score at followup     OV 05/15/2012  Followup COPD (very severe), fatigue and lung cancer surveillance  FAtigue: there is some fatigue if at all but nearly nowhere as bad as before. She keeps up with exercise  COPD: Stable disease. CAT score (below) is 21 and improved from 23. She is allergic to spiriva so manages on symbicort. Wants refills  Lung cancer: she is s/p XRT. She had CT 05/01/12 and to me the nodule looks more consolidated likely reflective of consolidative process post radiation. There is description of new rib fracture associated with it. Per patient she felf sharp pain during dye injection in [place] and since then in the local area has had pain that has slowly improved. Apparently few months ago CXR by PMD did not show fracture but she has always been convinced she has fracture. This Nov 2013 CT has validated her feeling. She is content with warm pads. I suspect this is post xrt fractue but radiologist are questioning tumor invasion. I will check with Dr Pablo Ledger    CT chest 05/01/12 IMPRESSION:  1. Fracture of the right eighth rib laterally is near the original  right middle lobe nodule and could reflect a pathologic fracture.  No associated lytic lesion identified.  2. Subpleural density in the right middle lobe adjacent to the rib  fracture, without evidence of residual or recurrent mass.  3. No evidence of metastatic disease  Original Report Authenticated By: Richardean Sale, M   #fatigue  - this was due to  copd and cancer and radiation and deconditioning  - glad this is not much of a problem  - continue regular exercise  #COPD (very severe copd)  - copd is stable  - - continue inhalers as before and oxygen with exertion and sleep  #Cancer and Rt rib fracture  - the rib fractue is likely due to radiation and underlying osteoporosis; continue current pain management of warm pads  - the cancer appears stable without spread on CT Nov 2013 ; the changes seen are due to radiation I think but will have Dr Pablo Ledger double check to make sure I am right  #Followup  - 6 months  with CAT score at followup   OV 11/29/2012  FU COPD and lung cancer s/p XRT  Some summer increase dyonea. Otherwise stable. CT due next month by Dr Staci Righter. COPD CAT score is 28 and likely reflects summer dyspnea. No hx of aecopd    OV 06/11/2013  FU Gold stage 4 COPD  Husband died fall 08-26-12 due to pneumonia, VDRF. Sinced then grieving. Also, cleaning up dusty files since then but ended it 8 days ago. Then 3 days ago developed mild worsening cough associated with worsening dyspnea and chest tightness but no change in sputum volume or color. No ass. Edema, hemoptysis, COmpliant with symbicort and atrovent and singulair and o2  Lung cancer: has fu CT and office visit with rad onc Dr Pablo Ledger in a month   OV 06/14/13 - acute viisit  Chief Complaint  Patient presents with  . Acute Visit    Pt states her prednisone is making her heart race, shaking, SOB.  Pt had EMS come to her house on Tuesday for this.       Started Rx for AECOPD 3 days ago. That day was a long hard day with different errands. Also stressed with husband passing. She had acute worseing of dyspnea, tachycardia and shakes the following day which was 06/12/13. Pulse ox showed HR > 130 and this was unusual. EMS arrivaed and with Rx and purse lip breathing felt better. She has EMS tele strip; shows sinus tach. She has hnever had this problem with prednisone before  but wondered if this was related to prednisone. Since the event she actually feels better. HR now normal. EKG today done here was of poor quality bu in Lead 1 showed NSR. She now feels AECOPD getting better. On exam too thrush isgone      CAT COPD Symptom and Quality of Life Score ([employer])   10/3011 05/15/2012 3 11/29/2012  06/11/2013   Never Cough -> Cough all the time 2 3 3    No phlegm in chest -> Chest is full of phlegm 3 3 2    No chest tightness -> Chest feels very tight 2 0 3   No dyspnea for 1 flight stairs/hill -> Very dyspneic for 1 flight of stairs 5 5 5    No limitations for ADL at home -> Very limited with ADL at home 3 3 4    Confident leaving home -> Not at all confident leaving home 3 4 5    Sleep soundly -> Do not sleep soundly because of lung condition 2 3 3    Lots of Energy -> No energy at all 3 0 3   TOTAL Score (max 40)  23 21 28    o2   desat to 85% walking 50 feet on RA   BMI   Body mass index is 25.48 kg/(m^2).  Body mass index is 23.9 kg/(m^2).    Review of Systems  Constitutional: Negative for fever and unexpected weight change.  HENT: Negative for congestion, dental problem, ear pain, nosebleeds, postnasal drip, rhinorrhea, sinus pressure, sneezing, sore throat and trouble swallowing.   Eyes: Negative for redness and itching.  Respiratory: Positive for shortness of breath. Negative for cough, chest tightness and wheezing.   Cardiovascular: Negative for palpitations and leg swelling.  Gastrointestinal: Negative for nausea and vomiting.  Genitourinary: Negative for dysuria.  Musculoskeletal: Negative for joint swelling.  Skin: Negative for rash.  Neurological: Negative for headaches.  Hematological: Does not bruise/bleed easily.  Psychiatric/Behavioral: Negative for dysphoric mood. The patient is  not nervous/anxious.        Objective:   Physical Exam     Vitals reviewed. Constitutional: She is oriented to person, place, and time. She  appears well-developed and well-nourished. No distress.  HENT:  Head: Normocephalic and atraumatic.  Right Ear: External ear normal.  Left Ear: normal Mouth/Throat: Oropharynx  Eyes: Conjunctivae and EOM are normal. Pupils are equal, round, and reactive to light. Right eye exhibits no discharge. Left eye exhibits no discharge. No scleral icterus.  Neck: Normal range of motion. Neck supple. No JVD present. No tracheal deviation present. No thyromegaly present.  Cardiovascular: Normal rate, regular rhythm, normal heart sounds and intact distal pulses.  Exam reveals no gallop and no friction rub.   No murmur heard. Pulmonary/Chest: Effort normal and breath sounds normal. No respiratory distress. She has no wheezes. She has no rales. She exhibits no tenderness.       barrell chest expiraiton prolonged. No wheeze. No purse lip breathing re  Abdominal: Soft. Bowel sounds are normal. She exhibits no distension and no mass. There is no tenderness. There is no rebound and no guarding.  Musculoskeletal: Normal range of motion. She exhibits no edema and no tenderness.  Lymphadenopathy:    She has no cervical adenopathy.  Neurological: She is alert and oriented to person, place, and time. She has normal reflexes. No cranial nerve deficit. She exhibits normal muscle tone. Coordination normal.  Skin: Skin is warm and dry. No rash noted. She is not diaphoretic. No erythema. No pallor.  Psychiatric: She has a normal mood and affect. Her behavior is normal. Judgment and thought content normal.       Assessment & Plan:

## 2013-06-14 NOTE — Telephone Encounter (Signed)
Lead I shows NSR.  Other leads are uninterpretable.

## 2013-06-14 NOTE — Telephone Encounter (Signed)
Hi Dalton  This patient had acute tachycardia several days ago and EMS noted sinus tachy.  I did EKG on her today. I kind of feel is all resp movement and erratic. What do yout think?  THanks  Dr. Brand Males, M.D., [facility].C.P Pulmonary and Critical Care Medicine Staff Physician [place] [facility] Pager: [phone], If no answer or between  15:00h - 7:00h: call [phone]  06/14/2013 10:52 AM

## 2013-06-14 NOTE — Patient Instructions (Signed)
Finish off your instructions from 06/11/13 I will have a cardiologist review your EKG and story and let you know if and when you should see cardiology

## 2013-06-16 NOTE — Telephone Encounter (Signed)
Thanks a lot. No need to reply back

## 2013-06-16 NOTE — Assessment & Plan Note (Signed)
resolved 

## 2013-06-16 NOTE — Assessment & Plan Note (Signed)
Seems to be improving from AECOPD. Suspect panic, tachycardia was diseaes/stress/albuterol. Doubt PAF but will keep an eye. Advised to continue with AECOPD Rx

## 2013-06-22 ENCOUNTER — Telehealth: Payer: Self-pay | Admitting: Internal Medicine

## 2013-06-22 NOTE — Telephone Encounter (Signed)
I spoke with the pt and she states that her daughter is set for jury duty on the same day that she is scheduled to have testing done. She cannot go herself and her daughter is her caregiver. She states they advised they will need a letter from doctor in order to excuse her daughter. I advised that they need to bring [place] the jury summons by the office and we will see what we can do. I advised that they leave it to my attention. Clayton Bing, CMA

## 2013-06-25 ENCOUNTER — Telehealth: Payer: Self-pay | Admitting: Internal Medicine

## 2013-06-26 NOTE — Telephone Encounter (Signed)
Patient dropped off original jury summons

## 2013-06-26 NOTE — Telephone Encounter (Signed)
Forms dropped off were a copy of the jury summons not the original as advised. I called and spoke with the pt daughter [name] and advised that we have to have the original She will bring this by. [place] Bing, CMA

## 2013-07-05 ENCOUNTER — Encounter: Payer: Self-pay | Admitting: *Deleted

## 2013-07-05 NOTE — Telephone Encounter (Signed)
Chief Operating Officer and jury letter has been completed and mailed. I have kept copies in my inbox as well.  Pt advised. [place] Bing, CMA

## 2013-07-16 ENCOUNTER — Ambulatory Visit (HOSPITAL_COMMUNITY)
Admission: RE | Admit: 2013-07-16 | Discharge: 2013-07-16 | Disposition: A | Discharge status: Home / Self Care | Payer: Medicare Other | Source: Ambulatory Visit | Attending: Radiation Oncology | Admitting: Radiation Oncology

## 2013-07-16 ENCOUNTER — Ambulatory Visit
Admission: RE | Admit: 2013-07-16 | Discharge: 2013-07-16 | Disposition: A | Discharge status: Home / Self Care | Payer: Medicare Other | Source: Ambulatory Visit | Attending: Radiation Oncology | Admitting: Radiation Oncology

## 2013-07-16 ENCOUNTER — Encounter (HOSPITAL_COMMUNITY): Payer: Self-pay

## 2013-07-16 ENCOUNTER — Encounter (INDEPENDENT_AMBULATORY_CARE_PROVIDER_SITE_OTHER): Payer: Self-pay

## 2013-07-16 DIAGNOSIS — J984 Other disorders of lung: Secondary | ICD-10-CM | POA: Insufficient documentation

## 2013-07-16 DIAGNOSIS — I2510 Atherosclerotic heart disease of native coronary artery without angina pectoris: Secondary | ICD-10-CM | POA: Diagnosis not present

## 2013-07-16 DIAGNOSIS — Z923 Personal history of irradiation: Secondary | ICD-10-CM | POA: Diagnosis not present

## 2013-07-16 DIAGNOSIS — C342 Malignant neoplasm of middle lobe, bronchus or lung: Secondary | ICD-10-CM

## 2013-07-16 DIAGNOSIS — E079 Disorder of thyroid, unspecified: Secondary | ICD-10-CM | POA: Insufficient documentation

## 2013-07-16 DIAGNOSIS — J438 Other emphysema: Secondary | ICD-10-CM | POA: Diagnosis not present

## 2013-07-16 DIAGNOSIS — C3490 Malignant neoplasm of unspecified part of unspecified bronchus or lung: Secondary | ICD-10-CM | POA: Insufficient documentation

## 2013-07-16 DIAGNOSIS — K7689 Other specified diseases of liver: Secondary | ICD-10-CM | POA: Insufficient documentation

## 2013-07-16 DIAGNOSIS — X58XXXA Exposure to other specified factors, initial encounter: Secondary | ICD-10-CM | POA: Insufficient documentation

## 2013-07-16 DIAGNOSIS — S22009A Unspecified fracture of unspecified thoracic vertebra, initial encounter for closed fracture: Secondary | ICD-10-CM | POA: Diagnosis not present

## 2013-07-16 LAB — BUN AND CREATININE (CC13)
BUN: 6.2 mg/dL — AB (ref 7.0–26.0)
CREATININE: 0.7 mg/dL (ref 0.6–1.1)

## 2013-07-16 MED ORDER — IOHEXOL 300 MG/ML  SOLN
80.0000 mL | Freq: Once | INTRAMUSCULAR | Status: AC | PRN
  Administered 2013-07-16: 80 mL via INTRAVENOUS

## 2013-07-16 MED ORDER — IOHEXOL 300 MG/ML  SOLN: 100.0000 mL | Freq: Once | INTRAMUSCULAR | Status: AC | PRN

## 2013-07-19 ENCOUNTER — Ambulatory Visit
Admission: RE | Admit: 2013-07-19 | Discharge: 2013-07-19 | Disposition: A | Discharge status: Home / Self Care | Payer: Medicare Other | Source: Ambulatory Visit | Attending: Radiation Oncology | Admitting: Radiation Oncology

## 2013-07-19 VITALS — BP 133/64 | HR 99 | Temp 98.0°F | Wt 117.8 lb

## 2013-07-19 DIAGNOSIS — C342 Malignant neoplasm of middle lobe, bronchus or lung: Secondary | ICD-10-CM

## 2013-07-19 DIAGNOSIS — C3490 Malignant neoplasm of unspecified part of unspecified bronchus or lung: Secondary | ICD-10-CM | POA: Diagnosis not present

## 2013-07-19 NOTE — Progress Notes (Signed)
Department of Radiation Oncology  Phone:  [phone] Fax:        [phone]   Name: Sharia Averitt MRN: [id number]  DOB: 01/29/1934  Date: 07/19/2013  Follow Up Visit Note  Diagnosis: T1 N0 non-small cell lung cancer of the right middle lobe  Summary and Interval since last radiation: SBRT to a total dose of 60 gray completed 06/02/2011  Interval History: [place] presents today for routine followup.  She is 3 years out from treatment. She continues on oxygen. She lost her husband in October which has been hard on her.  She had a COPD exacerbation in January and is recovered from that.  She had a CT scan on 2/9 which showed the area in the right middle lobe has coninued to resolve and show basically some scar tissue. They had noticed increased prominence of a nodule in the left lower lobe now measuring 8x12 mm.  Her mediastinal and hilar lymph nodes were not enlarged. She had stable cyst within her liver and her kidney. I personally reviewed the scans.  Allergies:  Allergies  Allergen Reactions  . Codeine Other (See Comments)    Hyperactivity,crying  . Tiotropium Bromide Monohydrate     rash    Medications:  Current Outpatient Prescriptions  Medication Sig Dispense Refill  . albuterol (PROAIR HFA) 108 (90 BASE) MCG/ACT inhaler Inhale 2 puffs into the lungs every 6 (six) hours as needed.        [name] alendronate (FOSAMAX) 70 MG tablet Take 70 mg by mouth every 7 (seven) days. Take with a full glass of water on an empty stomach.       [name] atorvastatin (LIPITOR) 40 MG tablet Daily.      . budesonide-formoterol (SYMBICORT) 160-4.5 MCG/ACT inhaler Inhale 2 puffs into the lungs 2 (two) times daily.        . cetirizine (ZYRTEC) 10 MG tablet Take 10 mg by mouth daily.        . Cholecalciferol (VITAMIN D) 2000 UNITS CAPS Take 2 capsules by mouth daily.        [name] doxycycline (VIBRA-TABS) 100 MG tablet Take 1 tablet (100 mg total) by mouth 2 (two) times daily.  10 tablet  0  .  fluticasone (FLONASE) 50 MCG/ACT nasal spray Place 2 sprays into the nose as needed.       [name] ipratropium (ATROVENT HFA) 17 MCG/ACT inhaler Inhale 2 puffs into the lungs every 6 (six) hours.        . Multiple Vitamins-Minerals (MULTIVITAMIN WITH MINERALS) tablet Take 1 tablet by mouth daily.        [name] SINGULAIR 10 MG tablet Take 1 tablet by mouth Daily.       No current facility-administered medications for this encounter.    Physical Exam:  Filed Vitals:   07/19/13 1316  BP: 133/64  Pulse: 99  Temp: 98 F (36.7 C)   pleasant female with a nasal cannula in place. Alert and oriented x3. Normal respiratory effort.  IMPRESSION: Heather Barnes is a 80 y.o. female status post SBRT for a right middle lobe nodule with no evidence of disease and possible progression of a nodule in the left lower lobe  PLAN:  Ms. Detwiler looks good. Given the location in the left lower lobe, I think it is possible this is just artifact of CT scan cuts.  I would like to repeat a scan in 6-8 weeks. If it is growing at that time we can consider treatment. If it is stable in  size, I would continue to watch her. She is fine with that.   Thea Silversmith, MD

## 2013-07-19 NOTE — Progress Notes (Signed)
Patient here with daughter and family friend for routine follow up completion of radiation 06/02/11 to right middle lung.Wears 2 liters of oxygen.Shortness of breath on exertion.No cough.Had 2 respiratory flare-ups in January 2015 completed course of antibiotics and prednisone.doing well.denies pain.to review ct chest results.

## 2013-08-16 ENCOUNTER — Ambulatory Visit: Payer: Medicare Other | Admitting: Radiation Oncology

## 2013-09-10 ENCOUNTER — Encounter (HOSPITAL_COMMUNITY): Payer: Self-pay

## 2013-09-10 ENCOUNTER — Ambulatory Visit
Admission: RE | Admit: 2013-09-10 | Discharge: 2013-09-10 | Disposition: A | Discharge status: Home / Self Care | Payer: Medicare Other | Source: Ambulatory Visit | Attending: Radiation Oncology | Admitting: Radiation Oncology

## 2013-09-10 ENCOUNTER — Ambulatory Visit (HOSPITAL_COMMUNITY)
Admission: RE | Admit: 2013-09-10 | Discharge: 2013-09-10 | Disposition: A | Discharge status: Home / Self Care | Payer: Medicare Other | Source: Ambulatory Visit | Attending: Radiation Oncology | Admitting: Radiation Oncology

## 2013-09-10 DIAGNOSIS — C3490 Malignant neoplasm of unspecified part of unspecified bronchus or lung: Secondary | ICD-10-CM | POA: Insufficient documentation

## 2013-09-10 DIAGNOSIS — J438 Other emphysema: Secondary | ICD-10-CM | POA: Diagnosis not present

## 2013-09-10 DIAGNOSIS — J449 Chronic obstructive pulmonary disease, unspecified: Secondary | ICD-10-CM | POA: Diagnosis not present

## 2013-09-10 DIAGNOSIS — C342 Malignant neoplasm of middle lobe, bronchus or lung: Secondary | ICD-10-CM

## 2013-09-10 DIAGNOSIS — I700 Atherosclerosis of aorta: Secondary | ICD-10-CM | POA: Insufficient documentation

## 2013-09-10 DIAGNOSIS — E041 Nontoxic single thyroid nodule: Secondary | ICD-10-CM | POA: Insufficient documentation

## 2013-09-10 DIAGNOSIS — Z923 Personal history of irradiation: Secondary | ICD-10-CM | POA: Insufficient documentation

## 2013-09-10 DIAGNOSIS — J984 Other disorders of lung: Secondary | ICD-10-CM | POA: Insufficient documentation

## 2013-09-10 DIAGNOSIS — C3430 Malignant neoplasm of lower lobe, unspecified bronchus or lung: Secondary | ICD-10-CM | POA: Diagnosis not present

## 2013-09-10 DIAGNOSIS — J4489 Other specified chronic obstructive pulmonary disease: Secondary | ICD-10-CM | POA: Insufficient documentation

## 2013-09-10 LAB — BUN AND CREATININE (CC13)
BUN: 9.9 mg/dL (ref 7.0–26.0)
Creatinine: 0.7 mg/dL (ref 0.6–1.1)

## 2013-09-10 MED ORDER — IOHEXOL 300 MG/ML  SOLN
80.0000 mL | Freq: Once | INTRAMUSCULAR | Status: AC | PRN
  Administered 2013-09-10: 80 mL via INTRAVENOUS

## 2013-09-13 ENCOUNTER — Ambulatory Visit: Payer: Self-pay | Admitting: Radiation Oncology

## 2013-09-14 ENCOUNTER — Ambulatory Visit
Admission: RE | Admit: 2013-09-14 | Discharge: 2013-09-14 | Disposition: A | Discharge status: Home / Self Care | Payer: Medicare Other | Source: Ambulatory Visit | Attending: Radiation Oncology | Admitting: Radiation Oncology

## 2013-09-14 ENCOUNTER — Encounter: Payer: Self-pay | Admitting: Radiation Oncology

## 2013-09-14 VITALS — BP 120/64 | HR 84 | Temp 98.4°F | Resp 22 | Wt 116.3 lb

## 2013-09-14 DIAGNOSIS — C342 Malignant neoplasm of middle lobe, bronchus or lung: Secondary | ICD-10-CM | POA: Diagnosis not present

## 2013-09-14 NOTE — Progress Notes (Signed)
Department of Radiation Oncology  Phone:  [phone] Fax:        [phone]   Name: Heather Barnes MRN: [id number]  DOB: [birth year]  Date: 09/14/2013  Follow Up Visit Note  Diagnosis: T1 N0 non-small cell lung cancer of the right middle lobe  Summary and Interval since last radiation: SBRT to a total dose of 60 gray completed 06/02/2011  Interval History: Heather Barnes presents today for routine followup.  Her skin is improved. Again I think this is less likely her nodule has shrunk but more likely just scan thickness effect due to its proximity to the diaphragm. The left lower lobe nodule is actually smaller now measuring 1 cm x 4.5 mm and previously measuring 12 mm x 8 mm. No other evidence of progressive disease was noted. Her treatment changes were improved. She is accompanied by a friend. Her breathing symptoms are stable. She is looking forward to [place] the [place] the beach with [place].  Allergies:  Allergies  Allergen Reactions  . Codeine Other (See Comments)    Hyperactivity,crying  . Tiotropium Bromide Monohydrate     rash    Medications:  Current Outpatient Prescriptions  Medication Sig Dispense Refill  . albuterol (PROAIR HFA) 108 (90 BASE) MCG/ACT inhaler Inhale 2 puffs into the lungs every 6 (six) hours as needed.        [name] alendronate (FOSAMAX) 70 MG tablet Take 70 mg by mouth every 7 (seven) days. Take with a full glass of water on an empty stomach.       [name] atorvastatin (LIPITOR) 40 MG tablet Daily.      . budesonide-formoterol (SYMBICORT) 160-4.5 MCG/ACT inhaler Inhale 2 puffs into the lungs 2 (two) times daily.        . cetirizine (ZYRTEC) 10 MG tablet Take 10 mg by mouth daily.        . Cholecalciferol (VITAMIN D) 2000 UNITS CAPS Take 2 capsules by mouth daily.        [name] doxycycline (VIBRA-TABS) 100 MG tablet Take 1 tablet (100 mg total) by mouth 2 (two) times daily.  10 tablet  0  . fluticasone (FLONASE) 50 MCG/ACT nasal spray Place 2 sprays into the  nose as needed.       [name] ipratropium (ATROVENT HFA) 17 MCG/ACT inhaler Inhale 2 puffs into the lungs every 6 (six) hours.        . Multiple Vitamins-Minerals (MULTIVITAMIN WITH MINERALS) tablet Take 1 tablet by mouth daily.        [name] SINGULAIR 10 MG tablet Take 1 tablet by mouth Daily.       No current facility-administered medications for this encounter.    Physical Exam:  Filed Vitals:   09/14/13 1458  BP: 120/64  Pulse: 84  Temp: 98.4 F (36.9 C)  Resp: 22   pleasant female with a nasal cannula in place. Alert and oriented x3. Normal respiratory effort.  IMPRESSION: Heather Barnes is a 80 y.o. female status post SBRT for a right middle lobe nodule with no evidence of disease progression  PLAN: Followup in 6 months with a CT scan.  Thea Silversmith, MD

## 2013-09-14 NOTE — Progress Notes (Signed)
Patient for routine follow up completion of radiation December 2012.To review ct of chest results from 09/10/2013.Denies pain.Shortness of breath at all times but greater on exertion.Denies cough.Pollen causing breathing difficulty.

## 2013-09-24 DIAGNOSIS — K219 Gastro-esophageal reflux disease without esophagitis: Secondary | ICD-10-CM | POA: Diagnosis not present

## 2013-09-24 DIAGNOSIS — Z23 Encounter for immunization: Secondary | ICD-10-CM | POA: Diagnosis not present

## 2013-09-24 DIAGNOSIS — E049 Nontoxic goiter, unspecified: Secondary | ICD-10-CM | POA: Diagnosis not present

## 2013-09-24 DIAGNOSIS — M810 Age-related osteoporosis without current pathological fracture: Secondary | ICD-10-CM | POA: Diagnosis not present

## 2013-09-24 DIAGNOSIS — J45909 Unspecified asthma, uncomplicated: Secondary | ICD-10-CM | POA: Diagnosis not present

## 2013-09-24 DIAGNOSIS — C3490 Malignant neoplasm of unspecified part of unspecified bronchus or lung: Secondary | ICD-10-CM | POA: Diagnosis not present

## 2013-09-24 DIAGNOSIS — J449 Chronic obstructive pulmonary disease, unspecified: Secondary | ICD-10-CM | POA: Diagnosis not present

## 2013-09-24 DIAGNOSIS — E782 Mixed hyperlipidemia: Secondary | ICD-10-CM | POA: Diagnosis not present

## 2013-10-11 ENCOUNTER — Telehealth: Payer: Self-pay | Admitting: Internal Medicine

## 2013-10-11 NOTE — Telephone Encounter (Signed)
Spoke with the pt  She is planning on having a colonoscopy to be done with Dr Penelope Coop She wants to know if MR thinks that this is okay, with her having COPD, hx of Lung CA and being on supplemental o2  She plans to see Dr Penelope Coop for eval before they scheduled procedure  Please advise thanks!

## 2013-10-11 NOTE — Telephone Encounter (Signed)
Glad she called about this  A) she should absolutely bring to attention to Dr Penelope Coop about her copd  B) would advise colonoscoopy procedure only under anesthesia support in the hospital.  C) She should enquire about virtual colonoscopy option with him; I do not know much about it  D) I will copy Dr Penelope Coop on this message  Thanks  Dr. Brand Males, M.D., [facility].C.P Pulmonary and Critical Care Medicine Staff Physician [place] [facility] Pager: [phone], If no answer or between  15:00h - 7:00h: call [phone]  10/11/2013 9:17 AM

## 2013-10-11 NOTE — Telephone Encounter (Signed)
I spoke with the pt and notified of recs per MR She verbalized understanding and states nothing further needed

## 2013-11-21 DIAGNOSIS — Z1231 Encounter for screening mammogram for malignant neoplasm of breast: Secondary | ICD-10-CM | POA: Diagnosis not present

## 2013-12-10 ENCOUNTER — Encounter: Payer: Self-pay | Admitting: Internal Medicine

## 2013-12-10 ENCOUNTER — Ambulatory Visit (INDEPENDENT_AMBULATORY_CARE_PROVIDER_SITE_OTHER): Payer: Medicare Other | Admitting: Internal Medicine

## 2013-12-10 VITALS — BP 142/60 | HR 77 | Ht 59.0 in | Wt 115.2 lb

## 2013-12-10 DIAGNOSIS — B370 Candidal stomatitis: Secondary | ICD-10-CM | POA: Diagnosis not present

## 2013-12-10 DIAGNOSIS — J449 Chronic obstructive pulmonary disease, unspecified: Secondary | ICD-10-CM | POA: Diagnosis not present

## 2013-12-10 MED ORDER — MOMETASONE FURO-FORMOTEROL FUM 100-5 MCG/ACT IN AERO: 2.0000 | INHALATION_SPRAY | Freq: Two times a day (BID) | RESPIRATORY_TRACT | Status: DC

## 2013-12-10 MED ORDER — FLUCONAZOLE 100 MG PO TABS: ORAL_TABLET | ORAL | Status: DC

## 2013-12-10 NOTE — Progress Notes (Signed)
Subjective:    Patient ID: Heather Barnes, female    DOB: December 05, 1933, 80 y.o.   MRN: [id number]  HPI   80 year old ex 20 pack  Smoker quit 2000. REferred for severe dyspnea and pulmonary nodule.  OV 02/17/11: Followup COPD/dyspnea after PFT, pulmonary nodule and chest pain (atypical). Since last visit chest pain resolved. Finds o2and inhalers helps. No new complaints. She is here to discuss pft results and serum oncimmune test results. PFTs show gold stage 3 copd - fev1 0.6L/43%, ratio 44, dlco 5/34%. SErum onciummne test shows 1 antigen positive for lung cancer (highly specific test with high test positive predictive value). Her next ct scan is  Due mid-oct 2012    OV 04/23/2011: Fu after lung bx. She had PET - nodule was hot. No other uptake (discussed at [facility] prior to bx as well). Cowan feeling (see phone note) was not a surgical candidate for lobectomy but possibly for wedge resection with seeds. Bx 04/14/11 - NSCLC. Tolerated bx well. Post bx CXR showed local hge but not ptx. Immediate post bx phase well but last 3 days having early morning mild, small amount of hemoptysis (old dried blood). Not present rest of day. Also, same time  New nasal drainage that is clear. Denies increased, cough, wheeze, edema, chest pain, symcope. Walking sats 185 feet x 3 laps; pulse ox lowest was 90%. Spirometry shows: Fev1 today: 0.58L/33%; and almost gold stage 4 copd. CXR c/w  post biopsy changes and looks fairly clear. rec  06/11/2011 f/u ov/Wert cc increased sob and need for saba over baseline assoc with nasal congestion and hacking cough, white mucus, using lots of cough drops and on fosfamax q Saturday. No overt hb, no sinus pain or purulent drainage  Sleeping ok without nocturnal  or early am exacerbation  of respiratory  c/o's or need for noct saba. Also denies any obvious fluctuation of symptoms with weather or environmental changes or other aggravating or alleviating factors except as outlined above       OV 07/22/2011  Followup  Gold stage 3-4 COPD and Rt lung nodule - NSCLC, s.p curative XRT by Dr Pablo Ledger  Overall well and stable. Has recovered from AECOPD and back to baseline. Says only main issue is that fatigue with exeriton of 20-30 feet and this is asosciated with desaturation and tachycardia (hr 120s).  Lot of questions on expectations. Wants to go back to rehab. Today walking 185 feet x 3 laps: resting HR 91 with pulse ox 92%. AT end of 3 laps HR 112 with lowest pulse ox 86%. Denies chest pain. Recollects normal stress test years ago.   #COPD and shortness of breath and fatigue  - copd is stable  - fatigue is due to copd and recent cancer v cancer treatment  - nurse will re-refer you to rehab  - continue inhalers as before and oxygen with exertion and sleep  - if you develop chest pain let us know  -at folllowup will check TSH  #thrush  - there is yeast in your mouth and your voice is hoarse which means it could be in your throat too  - take diflucan tablet 100mg  daily x 5 days  -- rinse your mouth after inhaler use  - if it comes back, we have to consider some other inhaler  #Cancer  - this is improved on CT scan 07/12/11.  - I will let Dr Pablo Ledger know who plans to see you in august  - please  chck with Dr Pablo Ledger if ok to have mammogram  #Followup  - 3 months with spirometry at followup  - come sooner if there are problems   OV 11/04/2011 Followup cancer fatigue and oral thrush and copd (gold stage 4)  Cancer fatigue nearly resolved after attending rehab; she is very emoptional about the dramatic improvement due to rehab. She felt she lost hope and now has regained it. Oral thrush has resolved. In terms of copd, feels she is stable without worsening dyspnea or cough. FEv1 today 0.61L/36%, Ratio 45 and at basline gold stage 4 copd. CAT score 23 and reflects high copd burden   Past, Family, Social reviewed: no change since last visit  REC #fatigue  - this was due  to copd and cancer and radiation and deconditioning  - glad you are much better with rehab  - continue rehab  #COPD (very severe copd)  - copd is stable  - - continue inhalers as before and oxygen with exertion and sleep  - we will set up walk test and overnight pulse ox test to see if you still need oxygen or not  #t#Cancer  - this is improved on CT scan 07/12/11.  - please keep up followup with Dr Pablo Ledger in July 2013  #Followup  - 6 months with CAT score at followup     OV 05/15/2012  Followup COPD (very severe), fatigue and lung cancer surveillance  FAtigue: there is some fatigue if at all but nearly nowhere as bad as before. She keeps up with exercise  COPD: Stable disease. CAT score (below) is 21 and improved from 23. She is allergic to spiriva so manages on symbicort. Wants refills  Lung cancer: she is s/p XRT. She had CT 05/01/12 and to me the nodule looks more consolidated likely reflective of consolidative process post radiation. There is description of new rib fracture associated with it. Per patient she felf sharp pain during dye injection in [place] and since then in the local area has had pain that has slowly improved. Apparently few months ago CXR by PMD did not show fracture but she has always been convinced she has fracture. This Nov 2013 CT has validated her feeling. She is content with warm pads. I suspect this is post xrt fractue but radiologist are questioning tumor invasion. I will check with Dr Pablo Ledger    CT chest 05/01/12 IMPRESSION:  1. Fracture of the right eighth rib laterally is near the original  right middle lobe nodule and could reflect a pathologic fracture.  No associated lytic lesion identified.  2. Subpleural density in the right middle lobe adjacent to the rib  fracture, without evidence of residual or recurrent mass.  3. No evidence of metastatic disease  Original Report Authenticated By: Richardean Sale, M   #fatigue  - this was due to  copd and cancer and radiation and deconditioning  - glad this is not much of a problem  - continue regular exercise  #COPD (very severe copd)  - copd is stable  - - continue inhalers as before and oxygen with exertion and sleep  #Cancer and Rt rib fracture  - the rib fractue is likely due to radiation and underlying osteoporosis; continue current pain management of warm pads  - the cancer appears stable without spread on CT Nov 2013 ; the changes seen are due to radiation I think but will have Dr Pablo Ledger double check to make sure I am right  #Followup  - 6 months  with CAT score at followup   OV 11/29/2012  FU COPD and lung cancer s/p XRT  Some summer increase dyonea. Otherwise stable. CT due next month by Dr Staci Righter. COPD CAT score is 28 and likely reflects summer dyspnea. No hx of aecopd    OV 06/11/2013  FU Gold stage 4 COPD  Husband died fall 09-09-12 due to pneumonia, VDRF. Sinced then grieving. Also, cleaning up dusty files since then but ended it 8 days ago. Then 3 days ago developed mild worsening cough associated with worsening dyspnea and chest tightness but no change in sputum volume or color. No ass. Edema, hemoptysis, COmpliant with symbicort and atrovent and singulair and o2  Lung cancer: has fu CT and office visit with rad onc Dr Pablo Ledger in a month   OV 06/14/13 - acute viisit  Chief Complaint  Patient presents with  . Acute Visit    Pt states her prednisone is making her heart race, shaking, SOB.  Pt had EMS come to her house on Tuesday for this.       Started Rx for AECOPD 3 days ago. That day was a long hard day with different errands. Also stressed with husband passing. She had acute worseing of dyspnea, tachycardia and shakes the following day which was 06/12/13. Pulse ox showed HR > 130 and this was unusual. EMS arrivaed and with Rx and purse lip breathing felt better. She has EMS tele strip; shows sinus tach. She has hnever had this problem with prednisone before  but wondered if this was related to prednisone. Since the event she actually feels better. HR now normal. EKG today done here was of poor quality bu in [place] 1 showed NSR. She now feels AECOPD getting better. On exam too thrush isgone  REC  #COPD (very severe copd)  - copd is in flare up - - continue inhalers as before and oxygen with exertion and sleep  #AECOPD Take doxycycline 100mg  po twice daily x 5 days; take after meals and avoid sunlight Please take prednisone 40 mg x1 day, then 30 mg x1 day, then 20 mg x1 day, then 10 mg x1 day, and then 5 mg x1 day and stop   #Cancer and Rt rib fracture  - per Dr Pablo Ledger with SCan in FEb 2015  #Thrush # Oral thrush - For Oral thrush: Take Suspension (swish and swallow): 500,000 units 4 times/day for 5 days; swish in the mouth and retain for as long as possible (several minutes) before swallowing   Followup - 6 months with CAT score at followup   OV 12/10/2013  Chief Complaint  Patient presents with  . Follow-up    Pt states she is feeling much better since last visit. Pt states her dyspnea only occurs when she is over exerting herself. Pt states she has a scratchy throat for an hour every time she take symbicort. Denies CP/tightness.     COPD: well., CAT 30 and just above baseline; symptoms in table below. Unitnetinal wt loss is making dyspnea better. Uses 02 + atrovent 4 times a day + symbicort  New: having hoarseness., Rx last time for thrush. Exam still shows thrush  CAncer: CT April 2015 shows under remission. Follows with Dr Pablo Ledger    CAT COPD Symptom and Quality of Life Score ([employer]) 05/15/2012 3 11/29/2012  06/11/2013  12/10/2013   Never Cough -> Cough all the time 3 3  3   No phlegm in chest -> Chest is full of phlegm  3 2  3   No chest tightness -> Chest feels very tight 0 3  3  No dyspnea for 1 flight stairs/hill -> Very dyspneic for 1 flight of stairs 5 5  5   No limitations for ADL at home -> Very  limited with ADL at home 3 4  4   Confident leaving home -> Not at all confident leaving home 4 5  4   Sleep soundly -> Do not sleep soundly because of lung condition 3 3  5   Lots of Energy -> No energy at all 0 3  3  TOTAL Score (max 40)  21 28  30   o2  desat to 85% walking 50 feet on RA    BMI  Body mass index is 25.48 kg/(m^2).  Body mass index is 23.9 kg/(m^2).  Body mass index is 23.25 kg/(m^2).     Review of Systems  Constitutional: Negative for fever and unexpected weight change.  HENT: Negative for congestion, dental problem, ear pain, nosebleeds, postnasal drip, rhinorrhea, sinus pressure, sneezing, sore throat and trouble swallowing.   Eyes: Negative for redness and itching.  Respiratory: Positive for cough and shortness of breath. Negative for chest tightness and wheezing.   Cardiovascular: Negative for palpitations and leg swelling.  Gastrointestinal: Negative for nausea and vomiting.  Genitourinary: Negative for dysuria.  Musculoskeletal: Negative for joint swelling.  Skin: Negative for rash.  Neurological: Negative for headaches.  Hematological: Does not bruise/bleed easily.  Psychiatric/Behavioral: Negative for dysphoric mood. The patient is not nervous/anxious.        Objective:   Physical Exam   Filed Vitals:   12/10/13 1006  BP: 142/60  Pulse: 77  Height: 4\' 11"  (1.499 m)  Weight: 115 lb 3.2 oz (52.254 kg)  SpO2: 97%   Vitals reviewed. Constitutional: She is oriented to person, place, and time. She appears well-developed and well-nourished. No distress.  HENT: ORAL THRUSH + Head: Normocephalic and atraumatic.  Right Ear: External ear normal.  Left Ear: normal Mouth/Throat: Oropharynx  Eyes: Conjunctivae and EOM are normal. Pupils are equal, round, and reactive to light. Right eye exhibits no discharge. Left eye exhibits no discharge. No scleral icterus.  Neck: Normal range of motion. Neck supple. No JVD present. No tracheal deviation present. No  thyromegaly present.  Cardiovascular: Normal rate, regular rhythm, normal heart sounds and intact distal pulses.  Exam reveals no gallop and no friction rub.   No murmur heard. Pulmonary/Chest: Effort normal and breath sounds normal. No respiratory distress. She has no wheezes. She has no rales. She exhibits no tenderness.       barrell chest expiraiton prolonged. No wheeze. No purse lip breathing re  Abdominal: Soft. Bowel sounds are normal. She exhibits no distension and no mass. There is no tenderness. There is no rebound and no guarding.  Musculoskeletal: Normal range of motion. She exhibits no edema and no tenderness.  Lymphadenopathy:    She has no cervical adenopathy.  Neurological: She is alert and oriented to person, place, and time. She has normal reflexes. No cranial nerve deficit. She exhibits normal muscle tone. Coordination normal.  Skin: Skin is warm and dry. No rash noted. She is not diaphoretic. No erythema. No pallor.  Psychiatric: She has a normal mood and affect. Her behavior is normal. Judgment and thought content normal.         Assessment & Plan:  #COPD (very severe copd)  - copd is stable - continue atroven 4 times daily  - due to thrush, stop  symbicort but take dulera 100/5 2 puff twice daily; learn tehcniquq - - continue oxygen with exertion and sleep   ORal thrush  - still persists and can explain hoarse voice  - take diflucan 100mg  once daily x 7 days  - if symptoms do not resolve call us; might need ENT referral   Followup - 6 months with CAT score at followup - flu shot in fall

## 2013-12-10 NOTE — Patient Instructions (Addendum)
#  COPD (very severe copd)  - copd is stable - continue atroven 4 times daily  - due to thrush, stop symbicort but take dulera 100/5 2 puff twice daily; learn tehcniquq - - continue oxygen with exertion and sleep   ORal thrush  - still persists and can explain hoarse voice  - take diflucan 100mg  once daily x 7 days  - if symptoms do not resolve call us; might need ENT referral   Followup - 6 months with CAT score at followup - flu shot in fall

## 2013-12-15 NOTE — Assessment & Plan Note (Signed)
#  COPD (very severe copd)  - copd is stable - continue atroven 4 times daily  - due to thrush, stop symbicort but take dulera 100/5 2 puff twice daily; learn tehcniquq - - continue oxygen with exertion and sleep   Followup - 6 months with CAT score at followup - flu shot in fall

## 2013-12-15 NOTE — Assessment & Plan Note (Signed)
ORal thrush  - still persists and can explain hoarse voice  - take diflucan 100mg  once daily x 7 days  - if symptoms do not resolve call us; might need ENT referral

## 2013-12-25 ENCOUNTER — Telehealth: Payer: Self-pay | Admitting: Internal Medicine

## 2013-12-25 NOTE — Telephone Encounter (Signed)
ATC fast busy signal x 3 wcb

## 2013-12-26 MED ORDER — MOMETASONE FURO-FORMOTEROL FUM 100-5 MCG/ACT IN AERO: 2.0000 | INHALATION_SPRAY | Freq: Two times a day (BID) | RESPIRATORY_TRACT | Status: DC

## 2013-12-26 NOTE — Telephone Encounter (Signed)
Provide samples until MR returns

## 2013-12-26 NOTE — Telephone Encounter (Signed)
Pt aware samples left for pick up.  Please advise MR thanks

## 2013-12-26 NOTE — Telephone Encounter (Signed)
Called spoke w/ pt. She reports the co-pay for dulera is $70. She can't afford this. She was changed from symbicort d/t hoarseness. Pt requests alternative. MR is not in the office and is out of her dulera sample. Please advise Dr. Joya Gaskins thanks  Allergies  Allergen Reactions  . Codeine Other (See Comments)    Hyperactivity,crying  . Tiotropium Bromide Monohydrate     rash

## 2014-01-07 NOTE — Telephone Encounter (Signed)
Heather Barnes  Can we do a prior auth for dulera; would that help lower costs?   Thanks  Dr. Brand Males, M.D., [facility].C.P Pulmonary and Critical Care Medicine Staff Physician Page Pulmonary and Critical Care Pager: [phone], If no answer or between  15:00h - 7:00h: call [phone]  01/07/2014 12:06 PM

## 2014-01-09 NOTE — Telephone Encounter (Signed)
Called [employer] @ [phone] Option #3 Spoke with Senaida Ores.-- Prior Auth // Tier Exception Patient ID [id number] Ruthe Mannan [id number] is Tier 4 drug ($70)-- can be dropped down to a Tier 3 ($30) Will need to speak with PA Dept to initiate Tier Exception claim  Direct number to Prior Auth Dept [phone]  Option #5 Spoke with Adonis Brook-- Initiated Tier Exception over the phone. Sent to Clinical Review Will contact our office once they have determination.  Will send to [facility] to follow up on.

## 2014-01-09 NOTE — Telephone Encounter (Signed)
Received call from PA dept and was given the verbal approval for tier 3 exception for the pt's Dulera. Called and spoke to pharmacist with [facility] on [address] and the approval went through to the pharmacy. Called and spoke to pt to inform her of the teir exception approval and her new copay of $30. Pt verbalized understanding and denied any further questions or concerns at this time. Nothing further needed at this time.

## 2014-02-15 ENCOUNTER — Telehealth: Payer: Self-pay | Admitting: *Deleted

## 2014-02-15 ENCOUNTER — Other Ambulatory Visit: Payer: Self-pay | Admitting: Radiation Oncology

## 2014-02-15 DIAGNOSIS — C342 Malignant neoplasm of middle lobe, bronchus or lung: Secondary | ICD-10-CM

## 2014-02-15 NOTE — Telephone Encounter (Signed)
Called  Patient to inform of CT on 03-18-14 - arrival time - 8:15 am @ [facility], spoke with patient and she is aware of this test, and her fu visit with Dr. Pablo Ledger on 03-21-14 @ 1 pm.

## 2014-03-18 ENCOUNTER — Ambulatory Visit (HOSPITAL_COMMUNITY)
Admission: RE | Admit: 2014-03-18 | Discharge: 2014-03-18 | Disposition: A | Discharge status: Home / Self Care | Payer: Medicare Other | Source: Ambulatory Visit | Attending: Radiation Oncology | Admitting: Radiation Oncology

## 2014-03-18 ENCOUNTER — Encounter (HOSPITAL_COMMUNITY): Payer: Self-pay

## 2014-03-18 DIAGNOSIS — R079 Chest pain, unspecified: Secondary | ICD-10-CM | POA: Diagnosis not present

## 2014-03-18 DIAGNOSIS — C342 Malignant neoplasm of middle lobe, bronchus or lung: Secondary | ICD-10-CM | POA: Insufficient documentation

## 2014-03-18 DIAGNOSIS — R05 Cough: Secondary | ICD-10-CM | POA: Diagnosis not present

## 2014-03-18 DIAGNOSIS — R0602 Shortness of breath: Secondary | ICD-10-CM | POA: Insufficient documentation

## 2014-03-18 DIAGNOSIS — Z923 Personal history of irradiation: Secondary | ICD-10-CM | POA: Insufficient documentation

## 2014-03-18 DIAGNOSIS — J438 Other emphysema: Secondary | ICD-10-CM | POA: Diagnosis not present

## 2014-03-21 ENCOUNTER — Ambulatory Visit
Admission: RE | Admit: 2014-03-21 | Discharge: 2014-03-21 | Disposition: A | Discharge status: Home / Self Care | Payer: Medicare Other | Source: Ambulatory Visit | Attending: Radiation Oncology | Admitting: Radiation Oncology

## 2014-03-21 ENCOUNTER — Telehealth: Payer: Self-pay | Admitting: *Deleted

## 2014-03-21 VITALS — BP 134/56 | HR 84 | Temp 98.1°F | Resp 24 | Wt 116.1 lb

## 2014-03-21 DIAGNOSIS — C342 Malignant neoplasm of middle lobe, bronchus or lung: Secondary | ICD-10-CM

## 2014-03-21 NOTE — Telephone Encounter (Signed)
CALLED PATIENT TO INFORM OF TEST, NO ANSWER WILL CALL LATER.

## 2014-03-21 NOTE — Telephone Encounter (Signed)
Called patient to inform of test for 12-20-14- arrival time - 8:45 am @ [facility], spoke with patient and she is aware of this test.

## 2014-03-21 NOTE — Progress Notes (Signed)
She is currently in no pain. Pt complains of, Loss of Sleep. Wheezing and Shortness of Breath- Walking and Stairs. Pt is on 2 liters/min via Patient connected to nasal cannula oxygen. Pt reports skin is warm dry and intact Pt denies dysphagia. The patient eats a regular, healthy diet.

## 2014-03-21 NOTE — Progress Notes (Signed)
Department of Radiation Oncology  Phone:  [phone] Fax:        [phone]   Name: Heather Barnes MRN: [id number]  DOB: May 01, 1934  Date: 03/21/2014  Follow Up Visit Note  Diagnosis: T1 N0 non-small cell lung cancer of the right middle lobe  Summary and Interval since last radiation: SBRT to a total dose of 60 gray completed 06/02/2011 (3 years out from treatment)  Interval History: Heather Barnes presents today for routine followup.  She is doing well. She has no complaints. Her breathing is stable. She had a CT scan earlier this week that showed stability of the radiation change in her right middle lobe. She has an appointment with Dr. Alfonso Patten in January.   Allergies:  Allergies  Allergen Reactions  . Codeine Other (See Comments)    Hyperactivity,crying  . Tiotropium Bromide Monohydrate     rash    Medications:  Current Outpatient Prescriptions  Medication Sig Dispense Refill  . albuterol (PROAIR HFA) 108 (90 BASE) MCG/ACT inhaler Inhale 2 puffs into the lungs every 6 (six) hours as needed.        [name] alendronate (FOSAMAX) 70 MG tablet Take 70 mg by mouth every 7 (seven) days. Take with a full glass of water on an empty stomach.       [name] atorvastatin (LIPITOR) 40 MG tablet Daily.      . budesonide-formoterol (SYMBICORT) 160-4.5 MCG/ACT inhaler Inhale 2 puffs into the lungs 2 (two) times daily.        . cetirizine (ZYRTEC) 10 MG tablet Take 10 mg by mouth daily.        . Cholecalciferol (VITAMIN D) 2000 UNITS CAPS Take 2 capsules by mouth daily.        . fluconazole (DIFLUCAN) 100 MG tablet Take once daily for 7 days  7 tablet  0  . fluticasone (FLONASE) 50 MCG/ACT nasal spray Place 2 sprays into the nose as needed.       [name] ipratropium (ATROVENT HFA) 17 MCG/ACT inhaler Inhale 2 puffs into the lungs every 6 (six) hours.        . mometasone-formoterol (DULERA) 100-5 MCG/ACT AERO Inhale 2 puffs into the lungs 2 (two) times daily.  4 Inhaler  0  . Multiple Vitamins-Minerals  (MULTIVITAMIN WITH MINERALS) tablet Take 1 tablet by mouth daily.        [name] SINGULAIR 10 MG tablet Take 1 tablet by mouth Daily.       No current facility-administered medications for this encounter.    Physical Exam:  Filed Vitals:   03/21/14 1313  BP: 134/56  Pulse: 84  Temp:   Resp:    pleasant female with a nasal cannula in place. Alert and oriented x3. Normal respiratory effort.  IMPRESSION: Heather Barnes is a 80 y.o. female status post SBRT for a right middle lobe nodule NED  PLAN:  Doing well. She needs a CT w/o contrast annually now per NCCN guidelines. She prefers to have this done to coincide with her visits with Dr. Chase Caller.  I have ordered one in June/July.  I have released her from follow up with me. Of course she can call with any questions in the interim.   Thea Silversmith, MD

## 2014-03-25 DIAGNOSIS — M810 Age-related osteoporosis without current pathological fracture: Secondary | ICD-10-CM | POA: Diagnosis not present

## 2014-03-25 DIAGNOSIS — E785 Hyperlipidemia, unspecified: Secondary | ICD-10-CM | POA: Diagnosis not present

## 2014-04-02 DIAGNOSIS — M810 Age-related osteoporosis without current pathological fracture: Secondary | ICD-10-CM | POA: Diagnosis not present

## 2014-04-02 DIAGNOSIS — K219 Gastro-esophageal reflux disease without esophagitis: Secondary | ICD-10-CM | POA: Diagnosis not present

## 2014-04-02 DIAGNOSIS — C3490 Malignant neoplasm of unspecified part of unspecified bronchus or lung: Secondary | ICD-10-CM | POA: Diagnosis not present

## 2014-04-02 DIAGNOSIS — Z86010 Personal history of colonic polyps: Secondary | ICD-10-CM | POA: Diagnosis not present

## 2014-04-02 DIAGNOSIS — J309 Allergic rhinitis, unspecified: Secondary | ICD-10-CM | POA: Diagnosis not present

## 2014-04-02 DIAGNOSIS — E782 Mixed hyperlipidemia: Secondary | ICD-10-CM | POA: Diagnosis not present

## 2014-04-02 DIAGNOSIS — J449 Chronic obstructive pulmonary disease, unspecified: Secondary | ICD-10-CM | POA: Diagnosis not present

## 2014-04-02 DIAGNOSIS — Z23 Encounter for immunization: Secondary | ICD-10-CM | POA: Diagnosis not present

## 2014-04-02 DIAGNOSIS — E049 Nontoxic goiter, unspecified: Secondary | ICD-10-CM | POA: Diagnosis not present

## 2014-04-08 ENCOUNTER — Telehealth: Payer: Self-pay | Admitting: Internal Medicine

## 2014-04-08 NOTE — Telephone Encounter (Signed)
LMTCB

## 2014-04-09 NOTE — Telephone Encounter (Signed)
Spoke with pt.  She is trying to choose her insurance medication coverage for next year and is concerned that she will be charged a $70 copay for [facility] again instead of the $30 that was approved.  Instructed pt that there is no way we will know now if the coverage will change but if this is an issue after the first of the year we can call and try to get another Tier exception done for her .  Pt verbalized understanding.

## 2014-04-10 NOTE — Telephone Encounter (Signed)
Duplicate message (see other 04/08/14 phone note) Will sign off

## 2014-06-07 HISTORY — PX: BIOPSY THYROID: PRO38

## 2014-07-02 ENCOUNTER — Encounter: Payer: Self-pay | Admitting: Internal Medicine

## 2014-07-02 ENCOUNTER — Ambulatory Visit (INDEPENDENT_AMBULATORY_CARE_PROVIDER_SITE_OTHER): Payer: Medicare Other | Admitting: Internal Medicine

## 2014-07-02 VITALS — BP 138/70 | HR 85 | Ht 59.0 in | Wt 119.0 lb

## 2014-07-02 DIAGNOSIS — B370 Candidal stomatitis: Secondary | ICD-10-CM

## 2014-07-02 DIAGNOSIS — J449 Chronic obstructive pulmonary disease, unspecified: Secondary | ICD-10-CM

## 2014-07-02 DIAGNOSIS — J9611 Chronic respiratory failure with hypoxia: Secondary | ICD-10-CM | POA: Diagnosis not present

## 2014-07-02 DIAGNOSIS — M25561 Pain in right knee: Secondary | ICD-10-CM | POA: Diagnosis not present

## 2014-07-02 MED ORDER — AEROCHAMBER MV MISC: Status: DC

## 2014-07-02 MED ORDER — FLUCONAZOLE 100 MG PO TABS: 100.0000 mg | ORAL_TABLET | Freq: Every day | ORAL | Status: DC

## 2014-07-02 NOTE — Progress Notes (Signed)
Subjective:    Patient ID: Heather Barnes, female    DOB: 12/07/1933, 81 y.o.   MRN: [id number]  HPI    OV 07/02/2014    Chief Complaint  Patient presents with  . Follow-up    Pt stated her breathing is unchanged since last OV. Pt c/o DOE, rhinorrhea. Pt denies significant cough and CP/tightness.    FU for below issues   COPD: well. Last seen in July 2015. Since then no interim COPD exacerbations or emergency room visits. She continues on Atrovent 4 times daily, Singulair and Dulera. At last visit I had switched to Symbicort to [facility] due to thrush. She says that [facility] actually works better for symptom control with improved oxygen levels on the pulse oximeter and lower heart rate. Her main issue now is that Atrovent is no longer a preferred drug on her insurance plan. She wants a cheaper alternative., Compliant with oxygen  Thrush: At last visit in July 2015 I prescribed her Diflucan for 1 week and also switch to Symbicort or Dulera. Currently she does not have any hoarse voice but exam does show mild oral thrush and no soft palate    CAncer: CT April 2015 in follow-up in October 2015 shows under remission. Follows with Dr Pablo Ledger  New issue: Having some nonspecific right medial knee pain when she stands up. No swelling or redness and no fever no chills no joint effusion   Past medical and social history reviewed: She slowly coping with the husband's death in 28-Aug-2013. Daughter who is age 10 has osteoporosis and might be diagnosed with cancer. She's undergoing a cancer workup. So she is frustrated by this. Weight appears stable       CAT COPD Symptom and Quality of Life Score (glaxo smith kline trademark) 11/29/2012  12/10/2013   Never Cough -> Cough all the time 3 3  No phlegm in chest -> Chest is full of phlegm 2 3  No chest tightness -> Chest feels very tight 3 3  No dyspnea for 1 flight stairs/hill -> Very dyspneic for 1 flight of stairs 5 5  No limitations for ADL at  home -> Very limited with ADL at home 4 4  Confident leaving home -> Not at all confident leaving home 5 4  Sleep soundly -> Do not sleep soundly because of lung condition 3 5  Lots of Energy -> No energy at all 3 3  TOTAL Score (max 40)  28 30  o2 desat to 85% walking 50 feet on RA   BMI Body mass index is 25.48 kg/(m^2).  Body mass index is 23.25 kg/(m^2).      Immunization History  Administered Date(s) Administered  . Influenza Split 04/07/2013, 03/07/2014  . Influenza Whole 02/06/2011, 03/07/2012  . Pneumococcal Polysaccharide-23 06/08/2003  . Pneumococcal-Unspecified 11/05/2012      Review of Systems  Constitutional: Negative for fever and unexpected weight change.  HENT: Positive for rhinorrhea. Negative for congestion, dental problem, ear pain, nosebleeds, postnasal drip, sinus pressure, sneezing, sore throat and trouble swallowing.   Eyes: Negative for redness and itching.  Respiratory: Positive for shortness of breath. Negative for cough, chest tightness and wheezing.   Cardiovascular: Negative for palpitations and leg swelling.  Gastrointestinal: Negative for nausea and vomiting.  Genitourinary: Negative for dysuria.  Musculoskeletal: Negative for joint swelling.  Skin: Negative for rash.  Neurological: Negative for headaches.  Hematological: Does not bruise/bleed easily.  Psychiatric/Behavioral: Negative for dysphoric mood. The patient is not nervous/anxious.  Current outpatient prescriptions:  .  albuterol (PROAIR HFA) 108 (90 BASE) MCG/ACT inhaler, Inhale 2 puffs into the lungs every 6 (six) hours as needed.  , Disp: , Rfl:  .  alendronate (FOSAMAX) 70 MG tablet, Take 70 mg by mouth every 7 (seven) days. Take with a full glass of water on an empty stomach. , Disp: , Rfl:  .  cetirizine (ZYRTEC) 10 MG tablet, Take 10 mg by mouth daily.  , Disp: , Rfl:  .  Cholecalciferol (VITAMIN D) 2000 UNITS CAPS, Take 2 capsules by mouth daily.  , Disp: , Rfl:  .   fluticasone (FLONASE) 50 MCG/ACT nasal spray, Place 2 sprays into the nose as needed. , Disp: , Rfl:  .  ipratropium (ATROVENT HFA) 17 MCG/ACT inhaler, Inhale 2 puffs into the lungs every 6 (six) hours.  , Disp: , Rfl:  .  mometasone-formoterol (DULERA) 100-5 MCG/ACT AERO, Inhale 2 puffs into the lungs 2 (two) times daily., Disp: 4 Inhaler, Rfl: 0 .  Multiple Vitamins-Minerals (MULTIVITAMIN WITH MINERALS) tablet, Take 1 tablet by mouth daily.  , Disp: , Rfl:  .  SINGULAIR 10 MG tablet, Take 1 tablet by mouth Daily., Disp: , Rfl:      Objective:   Physical Exam  Constitutional: She is oriented to person, place, and time. She appears well-developed and well-nourished. No distress.  frail  HENT:  Head: Normocephalic and atraumatic.  Right Ear: External ear normal.  Left Ear: External ear normal.  Mouth/Throat: Oropharynx is clear and moist. No oropharyngeal exudate.  o2 on  Eyes: Conjunctivae and EOM are normal. Pupils are equal, round, and reactive to light. Right eye exhibits no discharge. Left eye exhibits no discharge. No scleral icterus.  Neck: Normal range of motion. Neck supple. No JVD present. No tracheal deviation present. No thyromegaly present.  Cardiovascular: Normal rate, regular rhythm, normal heart sounds and intact distal pulses.  Exam reveals no gallop and no friction rub.   No murmur heard. Pulmonary/Chest: Effort normal and breath sounds normal. No respiratory distress. She has no wheezes. She has no rales. She exhibits no tenderness.  barrell chest + Purse lip breathing +  Abdominal: Soft. Bowel sounds are normal. She exhibits no distension and no mass. There is no tenderness. There is no rebound and no guarding.  Musculoskeletal: Normal range of motion. She exhibits no edema or tenderness.  Lymphadenopathy:    She has no cervical adenopathy.  Neurological: She is alert and oriented to person, place, and time. She has normal reflexes. No cranial nerve deficit. She  exhibits normal muscle tone. Coordination normal.  Skin: Skin is warm and dry. No rash noted. She is not diaphoretic. No erythema. No pallor.  Psychiatric: She has a normal mood and affect. Her behavior is normal. Judgment and thought content normal.  Vitals reviewed.   Filed Vitals:   07/02/14 0937  BP: 138/70  Pulse: 85  Height: 4\' 11"  (1.499 m)  Weight: 119 lb (53.978 kg)  SpO2: 98%         Assessment & Plan:     ICD-9-CM ICD-10-CM   1. COPD, very severe 496 J44.9   2. Chronic respiratory failure with hypoxia 518.83 J96.11    799.02    3. Oral thrush 112.0 B37.0   4. Knee pain, right 719.46 M25.561     #COPD (very severe copd)  - copd is stable - due to cost: change atrovent 4 times daily to INCRUSE one puff once daily  - take samples  -  work out cost for [employer] - if expensive but you like inhaler will do prior authorization - continue dulera 100/5 , 2 puff twice daily   - glad dulera is working better than symbicort  -  due to persistent thrush, start using dulera with a SPACER DEVICE  - continue oxygen 2L [place]  #ORal thrush  - still persists despite tablet treatment in July 2015  - START diflucan 100mg  once daily x 7 days - use dulera with spacer  - if does not resolve with above might need ENT referral  #Right Knee Pain  - try exercise I told you  - try tylneol 1gm once daily as needed for pain  - if persists, call PCP [name],[name], MD   Followup - 3 months with CAT score at followup - return sooner if needed    Dr. Brand Males, M.D., [facility].C.P Pulmonary and Critical Care Medicine Staff Physician [facility] Pager: [phone], If no answer or between  15:00h - 7:00h: call [phone]  07/02/2014 10:24 AM

## 2014-07-02 NOTE — Patient Instructions (Addendum)
ICD-9-CM ICD-10-CM   1. COPD, very severe 496 J44.9   2. Chronic respiratory failure with hypoxia 518.83 J96.11    799.02    3. Oral thrush 112.0 B37.0   4. Knee pain, right 719.46 M25.561      #COPD (very severe copd)  - copd is stable - due to cost: change atrovent 4 times daily to INCRUSE one puff once daily  - take samples  - work out cost for [employer] - if expensive but you like inhaler will do prior authorization - continue dulera 100/5 , 2 puff twice daily   - glad dulera is working better than symbicort  -  due to persistent thrush, start using dulera with a SPACER DEVICE  - continue oxygen 2L [place]  #ORal thrush  - still persists despite tablet treatment in July 2015  - START diflucan 100mg  once daily x 7 days - use dulera with spacer  - if does not resolve with above might need ENT referral  #Right Knee Pain  - try exercise I told you  - try tylneol 1gm once daily as needed for pain  - if persists, call PCP [name],[name], MD   Followup - 3 months with CAT score at followup - return sooner if needed

## 2014-07-09 ENCOUNTER — Telehealth: Payer: Self-pay | Admitting: Internal Medicine

## 2014-07-09 MED ORDER — UMECLIDINIUM BROMIDE 62.5 MCG/INH IN AEPB: 1.0000 | INHALATION_SPRAY | Freq: Every day | RESPIRATORY_TRACT | Status: DC

## 2014-07-09 NOTE — Telephone Encounter (Signed)
Rx has been sent in. Pt is aware. 

## 2014-09-25 ENCOUNTER — Other Ambulatory Visit: Payer: Self-pay | Admitting: Internal Medicine

## 2014-09-30 DIAGNOSIS — E049 Nontoxic goiter, unspecified: Secondary | ICD-10-CM | POA: Diagnosis not present

## 2014-09-30 DIAGNOSIS — K219 Gastro-esophageal reflux disease without esophagitis: Secondary | ICD-10-CM | POA: Diagnosis not present

## 2014-09-30 DIAGNOSIS — Z23 Encounter for immunization: Secondary | ICD-10-CM | POA: Diagnosis not present

## 2014-09-30 DIAGNOSIS — Z86010 Personal history of colonic polyps: Secondary | ICD-10-CM | POA: Diagnosis not present

## 2014-09-30 DIAGNOSIS — J449 Chronic obstructive pulmonary disease, unspecified: Secondary | ICD-10-CM | POA: Diagnosis not present

## 2014-09-30 DIAGNOSIS — C3490 Malignant neoplasm of unspecified part of unspecified bronchus or lung: Secondary | ICD-10-CM | POA: Diagnosis not present

## 2014-09-30 DIAGNOSIS — E782 Mixed hyperlipidemia: Secondary | ICD-10-CM | POA: Diagnosis not present

## 2014-09-30 DIAGNOSIS — M810 Age-related osteoporosis without current pathological fracture: Secondary | ICD-10-CM | POA: Diagnosis not present

## 2014-09-30 DIAGNOSIS — J309 Allergic rhinitis, unspecified: Secondary | ICD-10-CM | POA: Diagnosis not present

## 2014-10-07 ENCOUNTER — Other Ambulatory Visit: Payer: Self-pay | Admitting: Family Medicine

## 2014-10-07 DIAGNOSIS — K219 Gastro-esophageal reflux disease without esophagitis: Secondary | ICD-10-CM | POA: Diagnosis not present

## 2014-10-07 DIAGNOSIS — M810 Age-related osteoporosis without current pathological fracture: Secondary | ICD-10-CM | POA: Diagnosis not present

## 2014-10-07 DIAGNOSIS — E041 Nontoxic single thyroid nodule: Secondary | ICD-10-CM | POA: Diagnosis not present

## 2014-10-07 DIAGNOSIS — J449 Chronic obstructive pulmonary disease, unspecified: Secondary | ICD-10-CM | POA: Diagnosis not present

## 2014-10-07 DIAGNOSIS — E782 Mixed hyperlipidemia: Secondary | ICD-10-CM | POA: Diagnosis not present

## 2014-10-07 DIAGNOSIS — J4520 Mild intermittent asthma, uncomplicated: Secondary | ICD-10-CM | POA: Diagnosis not present

## 2014-10-07 DIAGNOSIS — C3490 Malignant neoplasm of unspecified part of unspecified bronchus or lung: Secondary | ICD-10-CM | POA: Diagnosis not present

## 2014-10-07 DIAGNOSIS — J309 Allergic rhinitis, unspecified: Secondary | ICD-10-CM | POA: Diagnosis not present

## 2014-10-14 ENCOUNTER — Ambulatory Visit (INDEPENDENT_AMBULATORY_CARE_PROVIDER_SITE_OTHER): Payer: Medicare Other | Admitting: Internal Medicine

## 2014-10-14 ENCOUNTER — Encounter: Payer: Self-pay | Admitting: Internal Medicine

## 2014-10-14 VITALS — BP 120/68 | HR 84 | Ht 59.0 in | Wt 119.0 lb

## 2014-10-14 DIAGNOSIS — C342 Malignant neoplasm of middle lobe, bronchus or lung: Secondary | ICD-10-CM | POA: Diagnosis not present

## 2014-10-14 DIAGNOSIS — J449 Chronic obstructive pulmonary disease, unspecified: Secondary | ICD-10-CM | POA: Diagnosis not present

## 2014-10-14 DIAGNOSIS — J9611 Chronic respiratory failure with hypoxia: Secondary | ICD-10-CM

## 2014-10-14 NOTE — Patient Instructions (Addendum)
ICD-9-CM ICD-10-CM   1. COPD, very severe 496 J44.9   2. Chronic respiratory failure with hypoxia 518.83 J96.11    799.02    3. Lung cancer, middle lobe 162.4 C34.2      #COPD (very severe copd)  - copd is stable - continue  INCRUSE daily - continue dulera 100/5 , 2 puff twice daily  - continue oxygen 2L [place]  #ORal thrush  -resolved without recurrence - continue to use spacer  #Lung cancer  - CT mid July 2016 as scheduled - call us for results  #FOllowup  6 months or sooner if needed

## 2014-10-14 NOTE — Progress Notes (Signed)
Subjective:    Patient ID: Heather Barnes, female    DOB: Sep 02, 1933, 81 y.o.   MRN: [id number]  HPI  OV 10/14/2014  Chief Complaint  Patient presents with  . Follow-up    Pt stated her breathing is unchanged. Pt stated she still has dyspnea with any activity. Pt c/o mild dry cough-pt relates to allergies. Pt denies CP/tightness.      FU for below issues   COPD: well.  Compliant with oxygen. Using [facility] and dulera. STable disease. Feels well. DOing some home exercises. Dyspnea is baseline without change  Thrush: recurring thrus. So last OV in jan 2016 gaive diflucan and advised spacer. No thrush this time   CAncer: CT April 2015 in follow-up in October 2015 shows under remission. NExt CT July 2016. Wants me to read the CT      Past medical and social history reviewed: She slowly coping with the husband's death in August 19, 2013. Daughter who is age 65 has osteoporosis and is seing GI Dr Olevia Perches for liver cysts and Dr Marin Olp for lung nodules'  OV      CAT COPD Symptom and Quality of Life Score (glaxo smith kline trademark) 11/29/2012  12/10/2013  10/14/2014   Never Cough -> Cough all the time 3 3   No phlegm in chest -> Chest is full of phlegm 2 3   No chest tightness -> Chest feels very tight 3 3   No dyspnea for 1 flight stairs/hill -> Very dyspneic for 1 flight of stairs 5 5   No limitations for ADL at home -> Very limited with ADL at home 4 4   Confident leaving home -> Not at all confident leaving home 5 4   Sleep soundly -> Do not sleep soundly because of lung condition 3 5   Lots of Energy -> No energy at all 3 3   TOTAL Score (max 40)  28 30   o2 desat to 85% walking 50 feet on RA    BMI Body mass index is 25.48 kg/(m^2).  Body mass index is 23.25 kg/(m^2).  Body mass index is 24.02 kg/(m^2).         Review of Systems  Constitutional: Negative for fever and unexpected weight change.  HENT: Negative for congestion, dental problem, ear pain, nosebleeds,  postnasal drip, rhinorrhea, sinus pressure, sneezing, sore throat and trouble swallowing.   Eyes: Negative for redness and itching.  Respiratory: Positive for cough and shortness of breath. Negative for chest tightness and wheezing.   Cardiovascular: Negative for palpitations and leg swelling.  Gastrointestinal: Negative for nausea and vomiting.  Genitourinary: Negative for dysuria.  Musculoskeletal: Negative for joint swelling.  Skin: Negative for rash.  Neurological: Negative for headaches.  Hematological: Does not bruise/bleed easily.  Psychiatric/Behavioral: Negative for dysphoric mood. The patient is not nervous/anxious.        Objective:   Physical Exam  Filed Vitals:   10/14/14 0901  BP: 120/68  Pulse: 84  Height: '4\' 11"'$  (1.499 m)  Weight: 119 lb (53.978 kg)  SpO2: 97%   Constitutional: She is oriented to person, place, and time. She appears well-developed and well-nourished. No distress.  frail  HENT:  Head: Normocephalic and atraumatic.  Right Ear: External ear normal.  Left Ear: External ear normal.  Mouth/Throat: Oropharynx is clear and moist. No oropharyngeal exudate.  o2 on , thrush resolved Eyes: Conjunctivae and EOM are normal. Pupils are equal, round, and reactive to light. Right eye exhibits no discharge. Left  eye exhibits no discharge. No scleral icterus.  Neck: Normal range of motion. Neck supple. No JVD present. No tracheal deviation present. No thyromegaly present.  Cardiovascular: Normal rate, regular rhythm, normal heart sounds and intact distal pulses.  Exam reveals no gallop and no friction rub.   No murmur heard. Pulmonary/Chest: Effort normal and breath sounds normal. No respiratory distress. She has no wheezes. She has no rales. She exhibits no tenderness.  barrell chest + Purse lip breathing +  Abdominal: Soft. Bowel sounds are normal. She exhibits no distension and no mass. There is no tenderness. There is no rebound and no guarding.    Musculoskeletal: Normal range of motion. She exhibits no edema or tenderness.  Lymphadenopathy:    She has no cervical adenopathy.  Neurological: She is alert and oriented to person, place, and time. She has normal reflexes. No cranial nerve deficit. She exhibits normal muscle tone. Coordination normal.  Skin: Skin is warm and dry. No rash noted. She is not diaphoretic. No erythema. No pallor.  Psychiatric: She has a normal mood and affect. Her behavior is normal. Judgment and thought content normal.  Vitals reviewed.        Assessment & Plan:      ICD-9-CM ICD-10-CM   1. COPD, very severe 496 J44.9   2. Chronic respiratory failure with hypoxia 518.83 J96.11    799.02    3. Lung cancer, middle lobe 162.4 C34.2       #COPD (very severe copd)  - copd is stable - continue  INCRUSE daily - continue dulera 100/5 , 2 puff twice daily  - continue oxygen 2L [place]  #ORal thrush  -resolved without recurrence - continue to use spacer  #Lung cancer  - CT mid July 2016 as scheduled - call us for results  #FOllowup  6 months or sooner if needed      Future Appointments Date Time Provider [place]  10/21/2014 11:00 AM [facility] [place] 3 [facility]  12/20/2014 9:00 AM [facility] 2 [facility] [place]

## 2014-10-21 ENCOUNTER — Ambulatory Visit
Admission: RE | Admit: 2014-10-21 | Discharge: 2014-10-21 | Disposition: A | Discharge status: Home / Self Care | Payer: Medicare Other | Source: Ambulatory Visit | Attending: Family Medicine | Admitting: Family Medicine

## 2014-10-21 DIAGNOSIS — E041 Nontoxic single thyroid nodule: Secondary | ICD-10-CM

## 2014-10-21 DIAGNOSIS — E042 Nontoxic multinodular goiter: Secondary | ICD-10-CM | POA: Diagnosis not present

## 2014-10-28 ENCOUNTER — Other Ambulatory Visit: Payer: Self-pay | Admitting: Family Medicine

## 2014-10-28 DIAGNOSIS — E041 Nontoxic single thyroid nodule: Secondary | ICD-10-CM

## 2014-10-29 ENCOUNTER — Other Ambulatory Visit: Payer: Self-pay | Admitting: Internal Medicine

## 2014-11-25 DIAGNOSIS — M810 Age-related osteoporosis without current pathological fracture: Secondary | ICD-10-CM | POA: Diagnosis not present

## 2014-12-03 ENCOUNTER — Ambulatory Visit
Admission: RE | Admit: 2014-12-03 | Discharge: 2014-12-03 | Disposition: A | Discharge status: Home / Self Care | Payer: Medicare Other | Source: Ambulatory Visit | Attending: Family Medicine | Admitting: Family Medicine

## 2014-12-03 ENCOUNTER — Other Ambulatory Visit (HOSPITAL_COMMUNITY)
Admission: RE | Admit: 2014-12-03 | Discharge: 2014-12-03 | Disposition: A | Discharge status: Home / Self Care | Payer: Medicare Other | Source: Ambulatory Visit | Attending: Interventional Radiology | Admitting: Interventional Radiology

## 2014-12-03 DIAGNOSIS — E041 Nontoxic single thyroid nodule: Secondary | ICD-10-CM

## 2014-12-20 ENCOUNTER — Telehealth: Payer: Self-pay | Admitting: Critical Care Medicine

## 2014-12-20 ENCOUNTER — Telehealth: Payer: Self-pay | Admitting: Internal Medicine

## 2014-12-20 ENCOUNTER — Ambulatory Visit (HOSPITAL_COMMUNITY)
Admission: RE | Admit: 2014-12-20 | Discharge: 2014-12-20 | Disposition: A | Discharge status: Home / Self Care | Payer: Medicare Other | Source: Ambulatory Visit | Attending: Radiation Oncology | Admitting: Radiation Oncology

## 2014-12-20 DIAGNOSIS — C3490 Malignant neoplasm of unspecified part of unspecified bronchus or lung: Secondary | ICD-10-CM | POA: Insufficient documentation

## 2014-12-20 DIAGNOSIS — Z923 Personal history of irradiation: Secondary | ICD-10-CM | POA: Insufficient documentation

## 2014-12-20 DIAGNOSIS — Z09 Encounter for follow-up examination after completed treatment for conditions other than malignant neoplasm: Secondary | ICD-10-CM | POA: Insufficient documentation

## 2014-12-20 DIAGNOSIS — I2510 Atherosclerotic heart disease of native coronary artery without angina pectoris: Secondary | ICD-10-CM | POA: Diagnosis not present

## 2014-12-20 DIAGNOSIS — C342 Malignant neoplasm of middle lobe, bronchus or lung: Secondary | ICD-10-CM

## 2014-12-20 DIAGNOSIS — J432 Centrilobular emphysema: Secondary | ICD-10-CM | POA: Diagnosis not present

## 2014-12-20 DIAGNOSIS — J439 Emphysema, unspecified: Secondary | ICD-10-CM | POA: Insufficient documentation

## 2014-12-20 NOTE — Telephone Encounter (Signed)
Error

## 2014-12-20 NOTE — Telephone Encounter (Signed)
ATC mobile # and received message the person I am trying to reach is not accepting calls at this time.  Called alternate NA, WCB

## 2014-12-23 NOTE — Telephone Encounter (Signed)
Spoke with pt, requesting ct chest results.   MR please advise on results.  Thanks!

## 2014-12-29 NOTE — Telephone Encounter (Signed)
IMPRESSION: of CT chest 12/20/14. GIVE HER THE FOLLOWING BELOW  1. Stable postradiation changes in the right lower lobe, without evidence to suggest local recurrence of disease or metastatic disease in the thorax. -> GOOD NEWS NO CANCER RECURRENCE    3. Atherosclerosis, including left main and 2 vessel coronary artery Disease. -> PLEASE ASK HER IF SHE HAS EVER HAD A CARDIAC STRESS TST. I COULD NOT  FIND EVIDENCE OF ONE. IF SHE HAS N+S NOT HAD A STRESS TEST IN THE LAST FEW YEARS, then REFER TO CARDS

## 2014-12-30 NOTE — Telephone Encounter (Signed)
lmtcb x1 

## 2014-12-31 NOTE — Telephone Encounter (Signed)
[phone], pt cb

## 2014-12-31 NOTE — Telephone Encounter (Signed)
ATC, WCB  Unable to leave voicemail. Automated message stated that party not available, try call again later.

## 2014-12-31 NOTE — Telephone Encounter (Signed)
lmtcb for pt.  

## 2015-01-01 NOTE — Telephone Encounter (Signed)
Pt is aware of results. She has not a cardiopulmonary stress recently. Order will be placed for Cardiology referral. Nothing further was needed at this time.

## 2015-01-01 NOTE — Telephone Encounter (Signed)
Patient has been out of town.  Is calling for CT results.  She can be reached at [phone].

## 2015-01-01 NOTE — Telephone Encounter (Signed)
lmtcb x2 for pt. 

## 2015-01-08 ENCOUNTER — Other Ambulatory Visit: Payer: Self-pay | Admitting: Internal Medicine

## 2015-01-10 ENCOUNTER — Ambulatory Visit: Payer: Medicare Other | Admitting: Cardiovascular Disease

## 2015-01-16 NOTE — Progress Notes (Signed)
Cardiology Office Note   Date:  01/17/2015   ID:  Heather Barnes, DOB 1933-08-19, MRN [id number]  PCP:  [name], MD  Cardiologist:   Sharol Harness, MD   Chief Complaint  Patient presents with  . New Evaluation    some pressure in chest  . Shortness of Breath      History of Present Illness: Heather Barnes is a 81 y.o. female with severe COPD and lung cancer in remission who presents for evaluation of coronary calcifications identified on cardiac CT.   Ms. Heather Barnes reports exertional dyspnea that has been getting progressively worse for years.  This is attributed to both her COPD and lung cancer.  She has not ever experienced chest discomfort.  She is unable to exercise due to dyspnea.  Ms. Heather Barnes went for her surveillance CT and was incidentally found to have calcification of her LM, LAD and RCA.  She was referred to cardiology for further evaluation.  Ms. Heather Barnes can't tolerate aspirin due to bleeding ulcers in the past and and easy bleeding now.  She was previously on atorvastatin but it was discontinued due to her age.  She tolerated the medication without complication.   Past Medical History  Diagnosis Date  . Asthma   . Hx of colonic polyps   . Osteoporosis   . Hyperlipidemia   . Goiter   . Shortness of breath   . COPD (chronic obstructive pulmonary disease)   . Lung cancer, middle lobe 04/26/2011  . Cataract     bilat.cataract/lens implant  . Allergy     seasonal pollen  . Colon adenomas   . History of radiation therapy 05/24/11-06/02/11    R middle lobe lung/ 60 gray    Past Surgical History  Procedure Laterality Date  . Total abdominal hysterectomy    . Tonsillectomy    . Eye surgery    . Colonoscopy w/ biopsies       Current Outpatient Prescriptions  Medication Sig Dispense Refill  . albuterol (PROAIR HFA) 108 (90 BASE) MCG/ACT inhaler Inhale 2 puffs into the lungs every 6 (six) hours as needed.      [name] alendronate (FOSAMAX) 70 MG tablet  Take 70 mg by mouth every 7 (seven) days. Take with a full glass of water on an empty stomach.     . cetirizine (ZYRTEC) 10 MG tablet Take 10 mg by mouth daily.      . Cholecalciferol (VITAMIN D) 2000 UNITS CAPS Take 2 capsules by mouth daily.      . fluconazole (DIFLUCAN) 100 MG tablet Take 1 tablet (100 mg total) by mouth daily. 7 tablet 0  . fluticasone (FLONASE) 50 MCG/ACT nasal spray Place 2 sprays into the nose as needed.     . INCRUSE ELLIPTA 62.5 MCG/INH AEPB INHALE ONE PUFF INTO THE LUNGS ONCE DAILY 30 each 0  . mometasone-formoterol (DULERA) 100-5 MCG/ACT AERO Inhale 2 puffs into the lungs 2 (two) times daily. 4 Inhaler 0  . mometasone-formoterol (DULERA) 100-5 MCG/ACT AERO Inhale 2 puffs into the lungs 2 (two) times daily. 13 g 5  . Multiple Vitamins-Minerals (MULTIVITAMIN WITH MINERALS) tablet Take 1 tablet by mouth daily.      [name] SINGULAIR 10 MG tablet Take 1 tablet by mouth Daily.    [name] Spacer/Aero-Holding Chambers (AEROCHAMBER MV) inhaler Use as instructed 1 each 0   No current facility-administered medications for this visit.    Allergies:   Codeine and Tiotropium bromide monohydrate  Social History:  The patient  reports that she quit smoking about 16 years ago. Her smoking use included Cigarettes. She has a 20 pack-year smoking history. She does not have any smokeless tobacco history on file. She reports that she does not drink alcohol or use illicit drugs.   Family History:  The patient's family history includes Brain cancer in her sister; Breast cancer in her daughter; Cancer in her sister, sister, and sister; Emphysema in her maternal uncle; Heart disease in her maternal grandfather and mother; Ovarian cancer in her sister; Rheum arthritis in her sister.    ROS:  Please see the history of present illness.   Otherwise, review of systems are positive for none.   All other systems are reviewed and negative.    PHYSICAL EXAM: VS:  BP 118/60 mmHg  Pulse 80  Ht 4' 10.75"  (1.492 m)  Wt 52.889 kg (116 lb 9.6 oz)  BMI 23.76 kg/m2 , BMI Body mass index is 23.76 kg/(m^2). GENERAL:  Well appearing HEENT:  Pupils equal round and reactive, fundi not visualized, oral mucosa unremarkable NECK:  No jugular venous distention, waveform within normal limits, carotid upstroke brisk and symmetric, no bruits, no thyromegaly LYMPHATICS:  No cervical adenopathy LUNGS:  Clear to auscultation bilaterally HEART:  RRR.  PMI not displaced or sustained,S1 and S2 within normal limits, no S3, no S4, no clicks, no rubs, no murmurs ABD:  Flat, positive bowel sounds normal in frequency in pitch, no bruits, no rebound, no guarding, no midline pulsatile mass, no hepatomegaly, no splenomegaly EXT:  2 plus pulses throughout, no edema, no cyanosis no clubbing SKIN:  No rashes no nodules NEURO:  Cranial nerves II through XII grossly intact, motor grossly intact throughout PSYCH:  Cognitively intact, oriented to person place and time    EKG:  EKG is ordered today. The ekg ordered today demonstrates sinus rhythm at 80 bpm.     Chest CT 12/20/14: Atherosclerosis of the thoracic aorta, mediastinal great vessels, LM, LAD and RCA. Recent Labs: No results found for requested labs within last 365 days.    Lipid Panel No results found for: CHOL, TRIG, HDL, CHOLHDL, VLDL, LDLCALC, LDLDIRECT    Wt Readings from Last 3 Encounters:  01/17/15 52.889 kg (116 lb 9.6 oz)  10/14/14 53.978 kg (119 lb)  07/02/14 53.978 kg (119 lb)      ASSESSMENT AND PLAN:  # CAD: Ms. Hege CT scan with coronary calcification indicates that she does have coronary artery disease. It's challenging to determine whether she is symptomatic from this as she is so limited from her pulmonary symptoms. This only possible that coronary artery disease could be contributing to her dyspnea. We weighed the options of pursuing stress testing with potentially subsequent cardiac catheterization versus medical management. At this  time she is more interested in medical management and prefers not to pursue more aggressive treatments. Given her history of bleeding she is not a candidate for aspirin therapy. She also has very severe COPD and beta blocker may exacerbate these symptoms. She agreed that this is not a risk is willing to take. She will restart atorvastatin, as she previously tolerated this medication well. I also offered the option of trying nitroglycerin and she prefers not to do that at this time. Her ASCVD 10 year risk is 21.7%. - Atorvastatin '40mg'$   - Unable to take beta blocker or ASA - Declines a trial of long-acting nitrates or stress testing.  Current medicines are reviewed at length with the  patient today.  The patient does not have concerns regarding medicines.  The following changes have been made:  Start atorvastatin  Labs/ tests ordered today include: none  No orders of the defined types were placed in this encounter.     Disposition:   FU with Dr. Jonelle Sidle C. Oval Linsey in 1 year   Signed, Sharol Harness, MD  01/17/2015 10:23 AM    [place]

## 2015-01-17 ENCOUNTER — Ambulatory Visit (INDEPENDENT_AMBULATORY_CARE_PROVIDER_SITE_OTHER): Payer: Medicare Other | Admitting: Cardiovascular Disease

## 2015-01-17 ENCOUNTER — Encounter: Payer: Self-pay | Admitting: Cardiovascular Disease

## 2015-01-17 VITALS — BP 118/60 | HR 80 | Ht 58.75 in | Wt 116.6 lb

## 2015-01-17 DIAGNOSIS — E785 Hyperlipidemia, unspecified: Secondary | ICD-10-CM | POA: Diagnosis not present

## 2015-01-17 DIAGNOSIS — I2510 Atherosclerotic heart disease of native coronary artery without angina pectoris: Secondary | ICD-10-CM

## 2015-01-17 MED ORDER — ATORVASTATIN CALCIUM 40 MG PO TABS: 40.0000 mg | ORAL_TABLET | Freq: Every day | ORAL | Status: DC

## 2015-01-17 NOTE — Patient Instructions (Signed)
START ATORVASTATIN 40 MG ONE TABLET AT BEDTIME- FOR CHOLESTEROL  NEED LABS IN 3 MONTHS - LIVER PANEL , LIPIDS- DO NOT EAT OR DRINK  THE MORNING  OF TEST -- WILL MAIL YOU THE LABSLIP IN 2 AND 1/2 MONTHS .   Your physician wants you to follow-up in [place].  You will receive a reminder letter in the mail two months in advance. If you don't receive a letter, please call our office to schedule the follow-up appointment.

## 2015-01-27 DIAGNOSIS — Z803 Family history of malignant neoplasm of breast: Secondary | ICD-10-CM | POA: Diagnosis not present

## 2015-01-27 DIAGNOSIS — Z1231 Encounter for screening mammogram for malignant neoplasm of breast: Secondary | ICD-10-CM | POA: Diagnosis not present

## 2015-02-03 ENCOUNTER — Telehealth: Payer: Self-pay | Admitting: Internal Medicine

## 2015-02-03 NOTE — Telephone Encounter (Signed)
Spoke with pt and advised that samples were left at front desk of [facility].

## 2015-02-26 ENCOUNTER — Other Ambulatory Visit: Payer: Self-pay | Admitting: Internal Medicine

## 2015-03-03 DIAGNOSIS — Z23 Encounter for immunization: Secondary | ICD-10-CM | POA: Diagnosis not present

## 2015-03-27 ENCOUNTER — Telehealth: Payer: Self-pay | Admitting: *Deleted

## 2015-03-27 DIAGNOSIS — E785 Hyperlipidemia, unspecified: Secondary | ICD-10-CM

## 2015-03-27 DIAGNOSIS — I2510 Atherosclerotic heart disease of native coronary artery without angina pectoris: Secondary | ICD-10-CM

## 2015-03-27 NOTE — Telephone Encounter (Signed)
-----   Message from Raiford Simmonds, RN sent at 01/17/2015 12:16 PM EDT ----- NEEDS LIVER LIPID  DUE IN NOV 2016 MAIL IN OCT 2016

## 2015-03-27 NOTE — Telephone Encounter (Signed)
Mail and letter, lab slip - liver , lipid due 04/22/15

## 2015-04-17 DIAGNOSIS — E785 Hyperlipidemia, unspecified: Secondary | ICD-10-CM | POA: Diagnosis not present

## 2015-04-17 DIAGNOSIS — I2510 Atherosclerotic heart disease of native coronary artery without angina pectoris: Secondary | ICD-10-CM | POA: Diagnosis not present

## 2015-04-18 LAB — LIPID PANEL
CHOLESTEROL: 184 mg/dL (ref 125–200)
HDL: 75 mg/dL (ref >=46)
LDL CALC: 81 mg/dL (ref <130)
TRIGLYCERIDES: 139 mg/dL (ref <150)
Total CHOL/HDL Ratio: 2.5 Ratio (ref <=5.0)
VLDL: 28 mg/dL (ref <30)

## 2015-04-18 LAB — HEPATIC FUNCTION PANEL
ALK PHOS: 56 U/L (ref 33–130)
ALT: 17 U/L (ref 6–29)
AST: 17 U/L (ref 10–35)
Albumin: 4.6 g/dL (ref 3.6–5.1)
BILIRUBIN INDIRECT: 0.6 mg/dL (ref 0.2–1.2)
Bilirubin, Direct: 0.1 mg/dL (ref <=0.2)
TOTAL PROTEIN: 6.9 g/dL (ref 6.1–8.1)
Total Bilirubin: 0.7 mg/dL (ref 0.2–1.2)

## 2015-04-22 ENCOUNTER — Telehealth: Payer: Self-pay | Admitting: *Deleted

## 2015-04-22 NOTE — Telephone Encounter (Signed)
-----   Message from Skeet Latch, MD sent at 04/22/2015  7:58 AM EST ----- Liver function stable and cholesterol numbers are good.  Continue atorvastatin (lipitor).

## 2015-04-22 NOTE — Telephone Encounter (Signed)
Spoke to patient. Result given . Verbalized understanding  

## 2015-04-29 ENCOUNTER — Encounter: Payer: Self-pay | Admitting: Internal Medicine

## 2015-04-29 ENCOUNTER — Ambulatory Visit (INDEPENDENT_AMBULATORY_CARE_PROVIDER_SITE_OTHER): Payer: Medicare Other | Admitting: Internal Medicine

## 2015-04-29 VITALS — BP 138/56 | HR 78 | Ht <= 58 in | Wt 118.0 lb

## 2015-04-29 DIAGNOSIS — J9611 Chronic respiratory failure with hypoxia: Secondary | ICD-10-CM | POA: Diagnosis not present

## 2015-04-29 DIAGNOSIS — C342 Malignant neoplasm of middle lobe, bronchus or lung: Secondary | ICD-10-CM

## 2015-04-29 DIAGNOSIS — J449 Chronic obstructive pulmonary disease, unspecified: Secondary | ICD-10-CM

## 2015-04-29 DIAGNOSIS — I2510 Atherosclerotic heart disease of native coronary artery without angina pectoris: Secondary | ICD-10-CM | POA: Diagnosis not present

## 2015-04-29 MED ORDER — AEROCHAMBER MV MISC: Status: DC

## 2015-04-29 NOTE — Progress Notes (Signed)
Subjective:     Patient ID: Heather Barnes, female   DOB: 1933-10-10, 81 y.o.   MRN: [id number]  HPI   OV 04/29/2015  Chief Complaint  Patient presents with  . Follow-up    Pt states recently she has developed sinus pressure, fever blister, sinus drainage, and dry cough. Pt states her SOB is at baseline. Pt denies CP/tightness.       FU for below issues./ Last visit was in May 2016.   COPD: well.  Compliant with oxygen. Using incruse and dulera. STable disease. Feels well. [name] Dyspnea is baseline without change. She is having some hoarseness of voice probably due to nasal drainage that is baseline and inhalers. This is mild. She is avoiding thrush with the help of a spacer but this is a has broken and she wants another one. She is having financial issues because of being on Medicare and in the donut hole. She wants samples. She continues with oxygen therapy.  T1 N0 non-small cell lung cancer of the right middle lobe. SBRT to a total dose of 60 gray completed 06/02/2011 (3 years out from treatment)CAncer: CT April 2015 in follow-up in October 2015 shows under remission.  CT July 2016 - no recurrence per report. I did not personally visualized the film but saw the report. According to the patient Dr. Pablo Ledger has now recommended 1 year follow-up through me.     Past medical and social history reviewed: She slowly coping with the husband's death in 08-21-2013. Daughter who is age 80 has osteoporosis and is seing GI Dr Olevia Perches for liver cysts and Dr Marin Olp for lung nodules'. At this point in time she says her mentally her emotions are improved. She is involved in her church which is [employer]. She is also doing Bible reading for a group of people at home for 2 hours every weekend. She is also proud of her grandson Rodman Key was in seventh grade and the younger one 40. Apparently both her A honor Psychologist, sport and exercise. She spent a lot of time talking about them. She did have a  thyroid nodule biopsy 11/25/2014. I visualized the report and it says it is benign. Patient has been told it is benign. In terms of coronary artery disease calcification she is seen Dr Skeet Latch was placed on medical management.         CAT COPD Symptom and Quality of Life Score ([employer]) 11/29/2012  12/10/2013  04/29/2015    Never Cough -> Cough all the time '3 3 3  '$ No phlegm in chest -> Chest is full of phlegm '2 3 3  '$ No chest tightness -> Chest feels very tight '3 3 2  '$ No dyspnea for 1 flight stairs/hill -> Very dyspneic for 1 flight of stairs '5 5 5  '$ No limitations for ADL at home -> Very limited with ADL at home '4 4 4  '$ Confident leaving home -> Not at all confident leaving home '5 4 4  '$ Sleep soundly -> Do not sleep soundly because of lung condition '3 5 3  '$ Lots of Energy -> No energy at all '3 3 3  '$ TOTAL Score (max 40)  '28 30 27  '$ o2 desat to 85% walking 50 feet on RA    BMI Body mass index is 25.48 kg/(m^2).  Body mass index is 23.25 kg/(m^2).  Body mass index is 24.67 kg/(m^2).        Current outpatient prescriptions:  .  albuterol (PROAIR HFA) 108 (90  BASE) MCG/ACT inhaler, Inhale 2 puffs into the lungs every 6 (six) hours as needed.  , Disp: , Rfl:  .  alendronate (FOSAMAX) 70 MG tablet, Take 70 mg by mouth every 7 (seven) days. Take with a full glass of water on an empty stomach. , Disp: , Rfl:  .  atorvastatin (LIPITOR) 40 MG tablet, Take 1 tablet (40 mg total) by mouth daily., Disp: 90 tablet, Rfl: 3 .  cetirizine (ZYRTEC) 10 MG tablet, Take 10 mg by mouth daily.  , Disp: , Rfl:  .  Cholecalciferol (VITAMIN D) 2000 UNITS CAPS, Take 2 capsules by mouth daily.  , Disp: , Rfl:  .  fluticasone (FLONASE) 50 MCG/ACT nasal spray, Place 2 sprays into the nose as needed. , Disp: , Rfl:  .  INCRUSE ELLIPTA 62.5 MCG/INH AEPB, INHALE ONE PUFF INTO THE LUNGS ONCE DAILY, Disp: 30 each, Rfl: 3 .  mometasone-formoterol (DULERA) 100-5 MCG/ACT AERO, Inhale 2 puffs  into the lungs 2 (two) times daily., Disp: 13 g, Rfl: 5 .  Multiple Vitamins-Minerals (MULTIVITAMIN WITH MINERALS) tablet, Take 1 tablet by mouth daily.  , Disp: , Rfl:  .  SINGULAIR 10 MG tablet, Take 1 tablet by mouth Daily., Disp: , Rfl:  .  Spacer/Aero-Holding Chambers (AEROCHAMBER MV) inhaler, Use as instructed, Disp: 1 each, Rfl: 0  Allergies  Allergen Reactions  . Codeine Other (See Comments)    Hyperactivity,crying  . Tiotropium Bromide Monohydrate     rash  . Asa [Aspirin] Other (See Comments)    Immunization History  Administered Date(s) Administered  . Influenza Split 04/07/2013, 03/07/2014  . Influenza Whole 02/06/2011, 03/07/2012  . Influenza,inj,Quad PF,36+ Mos 01/27/2015  . Influenza-Unspecified 01/05/2014  . Pneumococcal Polysaccharide-23 06/08/2003  . Pneumococcal-Unspecified 11/05/2012     Review of Systems According to HPI    Objective:   Physical Exam  Constitutional: She is oriented to person, place, and time. She appears well-developed and well-nourished. No distress.  Looks stronger  HENT:  Head: Normocephalic and atraumatic.  Right Ear: External ear normal.  Left Ear: External ear normal.  Mouth/Throat: Oropharynx is clear and moist. No oropharyngeal exudate.  Eyes: Conjunctivae and EOM are normal. Pupils are equal, round, and reactive to light. Right eye exhibits no discharge. Left eye exhibits no discharge. No scleral icterus.  Neck: Normal range of motion. Neck supple. No JVD present. No tracheal deviation present. No thyromegaly present.  Cardiovascular: Normal rate, regular rhythm, normal heart sounds and intact distal pulses.  Exam reveals no gallop and no friction rub.   No murmur heard. Pulmonary/Chest: Effort normal and breath sounds normal. No respiratory distress. She has no wheezes. She has no rales. She exhibits no tenderness.  Purse lip breathing that is baseline Improved air entry compared to last time  Abdominal: Soft. Bowel sounds  are normal. She exhibits no distension and no mass. There is no tenderness. There is no rebound and no guarding.  Musculoskeletal: Normal range of motion. She exhibits no edema or tenderness.  Lymphadenopathy:    She has no cervical adenopathy.  Neurological: She is alert and oriented to person, place, and time. She has normal reflexes. No cranial nerve deficit. She exhibits normal muscle tone. Coordination normal.  Skin: Skin is warm and dry. No rash noted. She is not diaphoretic. No erythema. No pallor.  Psychiatric: She has a normal mood and affect. Her behavior is normal. Judgment and thought content normal.  Improve mood  Vitals reviewed.   Filed Vitals:   04/29/15  1611  BP: 138/56  Pulse: 78  Height: '4\' 10"'$  (1.473 m)  Weight: 118 lb (53.524 kg)  SpO2: 96%         Assessment:       ICD-9-CM ICD-10-CM   1. COPD, very severe ([place]) 496 J44.9   2. Chronic respiratory failure with hypoxia ([facility]) 518.83 J96.11    799.02    3. Lung cancer, middle lobe ([facility]) 162.4 C34.2        Plan:       #COPD (very severe copd)  - copd is stable - continue  INCRUSE daily - continue dulera 100/5 , 2 puff twice daily  - continue oxygen 2L [place] - take  Script for sparcer  #Lung cancer  - CT mid July 2016 without recurrrence - next ct chest wo contrast July 2017 - we will schedule - call us for results  #FOllowup CT chest July 2017 OV July 2017 after CT chest with Dr Chase Caller or sooner if needed    Dr. Brand Males, M.D., [facility].C.P Pulmonary and Critical Care Medicine Staff Physician [place] [facility] Pager: [phone], If no answer or between  15:00h - 7:00h: call [phone]  04/29/2015 4:54 PM

## 2015-04-29 NOTE — Patient Instructions (Addendum)
ICD-9-CM ICD-10-CM   1. COPD, very severe 496 J44.9   2. Chronic respiratory failure with hypoxia 518.83 J96.11    799.02    3. Lung cancer, middle lobe 162.4 C34.2      #COPD (very severe copd)  - copd is stable - continue  INCRUSE daily - continue dulera 100/5 , 2 puff twice daily  - continue oxygen 2L [place] - take  Script for sparcer  #Lung cancer  - CT mid July 2016 without recurrrence - next ct chest wo contrast July 2017 - we will schedule - call us for results  #FOllowup CT chest July 2017 OV July 2017 after CT chest with Dr Chase Caller or sooner if needed

## 2015-05-19 ENCOUNTER — Telehealth: Payer: Self-pay | Admitting: Internal Medicine

## 2015-05-19 NOTE — Telephone Encounter (Signed)
Spoke with pt and advised that sample of Incruse was left at front desk for pick up.

## 2015-06-09 ENCOUNTER — Encounter (HOSPITAL_BASED_OUTPATIENT_CLINIC_OR_DEPARTMENT_OTHER): Payer: Self-pay | Admitting: *Deleted

## 2015-06-09 ENCOUNTER — Emergency Department (HOSPITAL_BASED_OUTPATIENT_CLINIC_OR_DEPARTMENT_OTHER): Payer: Medicare Other

## 2015-06-09 ENCOUNTER — Emergency Department (HOSPITAL_BASED_OUTPATIENT_CLINIC_OR_DEPARTMENT_OTHER)
Admission: EM | Admit: 2015-06-09 | Discharge: 2015-06-09 | Disposition: A | Discharge status: Home / Self Care | Payer: Medicare Other | Source: Home / Self Care | Attending: Emergency Medicine | Admitting: Emergency Medicine

## 2015-06-09 DIAGNOSIS — J189 Pneumonia, unspecified organism: Secondary | ICD-10-CM | POA: Diagnosis not present

## 2015-06-09 DIAGNOSIS — Z85118 Personal history of other malignant neoplasm of bronchus and lung: Secondary | ICD-10-CM | POA: Diagnosis not present

## 2015-06-09 DIAGNOSIS — J9621 Acute and chronic respiratory failure with hypoxia: Secondary | ICD-10-CM | POA: Diagnosis not present

## 2015-06-09 DIAGNOSIS — Z87891 Personal history of nicotine dependence: Secondary | ICD-10-CM | POA: Diagnosis not present

## 2015-06-09 DIAGNOSIS — J13 Pneumonia due to Streptococcus pneumoniae: Secondary | ICD-10-CM | POA: Diagnosis not present

## 2015-06-09 DIAGNOSIS — R0602 Shortness of breath: Secondary | ICD-10-CM | POA: Diagnosis not present

## 2015-06-09 DIAGNOSIS — J441 Chronic obstructive pulmonary disease with (acute) exacerbation: Secondary | ICD-10-CM

## 2015-06-09 DIAGNOSIS — J069 Acute upper respiratory infection, unspecified: Secondary | ICD-10-CM | POA: Diagnosis not present

## 2015-06-09 DIAGNOSIS — R05 Cough: Secondary | ICD-10-CM | POA: Diagnosis not present

## 2015-06-09 DIAGNOSIS — R000 Tachycardia, unspecified: Secondary | ICD-10-CM | POA: Diagnosis not present

## 2015-06-09 DIAGNOSIS — J440 Chronic obstructive pulmonary disease with acute lower respiratory infection: Secondary | ICD-10-CM | POA: Diagnosis not present

## 2015-06-09 DIAGNOSIS — A419 Sepsis, unspecified organism: Secondary | ICD-10-CM | POA: Diagnosis not present

## 2015-06-09 MED ORDER — PREDNISONE 20 MG PO TABS: ORAL_TABLET | ORAL | Status: DC

## 2015-06-09 MED ORDER — ALBUTEROL SULFATE (2.5 MG/3ML) 0.083% IN NEBU
5.0000 mg | INHALATION_SOLUTION | Freq: Once | RESPIRATORY_TRACT | Status: AC
  Administered 2015-06-09: 5 mg via RESPIRATORY_TRACT
  Filled 2015-06-09: qty 6

## 2015-06-09 MED ORDER — PREDNISONE 20 MG PO TABS
40.0000 mg | ORAL_TABLET | Freq: Once | ORAL | Status: AC
  Administered 2015-06-09: 40 mg via ORAL
  Filled 2015-06-09: qty 2

## 2015-06-09 MED FILL — predniSONE 20 MG TABS: 20 | 4 days supply | Qty: 4 | Fill #0

## 2015-06-09 NOTE — ED Notes (Signed)
Patient transported to xray dept.

## 2015-06-09 NOTE — ED Provider Notes (Signed)
CSN: [id number]     Arrival date & time 06/09/15  1031 History   First MD Initiated Contact with Patient 06/09/15 1045     Chief Complaint  Patient presents with  . Cough     (Consider location/radiation/quality/duration/timing/severity/associated sxs/prior Treatment) HPI Comments: Patient presents with cough. She has a history of COPD and lung cancer in remission. She uses albuterol inhalers as well as several preventative medications for her COPD. She reports a three-day history of runny nose congestion and increased cough. Today her cough got worse and she had increased shortness of breath and wheezing. Her cough is productive of clear sputum. She denies any fevers. No chest pain. Leg swelling. No nausea or vomiting. She's been using her regular medications without improvement of symptoms.  Patient is a 82 y.o. female presenting with cough.  Cough Associated symptoms: headaches, rhinorrhea, shortness of breath and wheezing   Associated symptoms: no chest pain, no chills, no diaphoresis, no fever and no rash     Past Medical History  Diagnosis Date  . Asthma   . Hx of colonic polyps   . Osteoporosis   . Hyperlipidemia   . Goiter   . Shortness of breath   . COPD (chronic obstructive pulmonary disease) ([place])   . Lung cancer, middle lobe ([place]) 04/26/2011  . Cataract     bilat.cataract/lens implant  . Allergy     seasonal pollen  . Colon adenomas   . History of radiation therapy 05/24/11-06/02/11    R middle lobe lung/ 60 gray   Past Surgical History  Procedure Laterality Date  . Total abdominal hysterectomy    . Tonsillectomy    . Eye surgery    . Colonoscopy w/ biopsies    . Biopsy thyroid  2016   Family History  Problem Relation Age of Onset  . Emphysema Maternal Uncle   . Heart disease Mother   . Heart disease Maternal Grandfather   . Rheum arthritis Sister   . Cancer Sister   . Cancer Sister     throat cancer  . Brain cancer Sister   . Cancer Sister   .  Ovarian cancer Sister   . Breast cancer Daughter    Social History  Substance Use Topics  . Smoking status: Former Smoker -- 0.50 packs/day for 40 years    Types: Cigarettes    Quit date: 06/07/1998  . Smokeless tobacco: None  . Alcohol Use: No   OB History    No data available     Review of Systems  Constitutional: Positive for fatigue. Negative for fever, chills and diaphoresis.  HENT: Positive for congestion, postnasal drip and rhinorrhea. Negative for sneezing.   Eyes: Negative.   Respiratory: Positive for cough, shortness of breath and wheezing. Negative for chest tightness.   Cardiovascular: Negative for chest pain and leg swelling.  Gastrointestinal: Negative for nausea, vomiting, abdominal pain, diarrhea and blood in stool.  Genitourinary: Negative for frequency, hematuria, flank pain and difficulty urinating.  Musculoskeletal: Negative for back pain and arthralgias.  Skin: Negative for rash.  Neurological: Positive for headaches. Negative for dizziness, speech difficulty, weakness and numbness.      Allergies  Codeine; Tiotropium bromide monohydrate; and Asa  Home Medications   Prior to Admission medications   Medication Sig Start Date End Date Taking? Authorizing Provider  albuterol (PROAIR HFA) 108 (90 BASE) MCG/ACT inhaler Inhale 2 puffs into the lungs every 6 (six) hours as needed.     Yes Historical Provider, MD  alendronate (FOSAMAX) 70 MG tablet Take 70 mg by mouth every 7 (seven) days. Take with a full glass of water on an empty stomach.    Yes Historical Provider, MD  atorvastatin (LIPITOR) 40 MG tablet Take 1 tablet (40 mg total) by mouth daily. 01/17/15  Yes Skeet Latch, MD  cetirizine (ZYRTEC) 10 MG tablet Take 10 mg by mouth daily.     Yes Historical Provider, MD  Cholecalciferol (VITAMIN D) 2000 UNITS CAPS Take 2 capsules by mouth daily.     Yes Historical Provider, MD  fluticasone (FLONASE) 50 MCG/ACT nasal spray Place 2 sprays into the nose as  needed.    Yes Historical Provider, MD  INCRUSE ELLIPTA 62.5 MCG/INH AEPB INHALE ONE PUFF INTO THE LUNGS ONCE DAILY 02/26/15  Yes Brand Males, MD  mometasone-formoterol (DULERA) 100-5 MCG/ACT AERO Inhale 2 puffs into the lungs 2 (two) times daily. 10/29/14  Yes Brand Males, MD  Multiple Vitamins-Minerals (MULTIVITAMIN WITH MINERALS) tablet Take 1 tablet by mouth daily.     Yes Historical Provider, MD  SINGULAIR 10 MG tablet Take 1 tablet by mouth Daily. 12/29/10  Yes Historical Provider, MD  Spacer/Aero-Holding Chambers (AEROCHAMBER MV) inhaler Use as instructed 07/02/14  Yes Brand Males, MD  Spacer/Aero-Holding Chambers (AEROCHAMBER MV) inhaler Use as instructed 04/29/15  Yes Brand Males, MD  predniSONE (DELTASONE) 20 MG tablet 1 tabs po daily x 4 days 06/09/15   Malvin Johns, MD   BP 127/53 mmHg  Pulse 88  Temp(Src) 98.7 F (37.1 C) (Oral)  Resp   SpO2 95% Physical Exam  Constitutional: She is oriented to person, place, and time. She appears well-developed and well-nourished.  HENT:  Head: Normocephalic and atraumatic.  Eyes: Pupils are equal, round, and reactive to light.  Neck: Normal range of motion. Neck supple.  Cardiovascular: Normal rate, regular rhythm and normal heart sounds.   Pulmonary/Chest: Effort normal. No respiratory distress. She has wheezes. She has no rales. She exhibits no tenderness.  Bilateral expiratory wheezing. No increased work of breathing.  Abdominal: Soft. Bowel sounds are normal. There is no tenderness. There is no rebound and no guarding.  Musculoskeletal: Normal range of motion. She exhibits no edema.  Lymphadenopathy:    She has no cervical adenopathy.  Neurological: She is alert and oriented to person, place, and time.  Skin: Skin is warm and dry. No rash noted.  Psychiatric: She has a normal mood and affect.    ED Course  Procedures (including critical care time) Labs Review Labs Reviewed - No data to display  Imaging  Review Dg Chest 2 View  06/09/2015  CLINICAL DATA:  Cough and increased shortness of breath. History of COPD and lung cancer. EXAM: CHEST  2 VIEW COMPARISON:  Chest x-ray dated 04/23/2011 and CT scan dated 12/20/2014 FINDINGS: Heart size is normal. Prominence of the main pulmonary arteries with peripheral pruning of the vessels consistent with pulmonary arterial hypertension. Severe emphysematous changes with hyperinflation of the lung and flattening of the diaphragm. Scarring at both lung bases. No acute osseous abnormality. Old compression deformity in the lower thoracic spine, stable. No acute infiltrates. IMPRESSION: Severe emphysema.  No acute abnormalities. Electronically Signed   By: Lorriane Shire M.D.   On: 06/09/2015 11:55   I have personally reviewed and evaluated these images and lab results as part of my medical decision-making.   EKG Interpretation None      MDM   Final diagnoses:  COPD exacerbation ([place])  URI (upper respiratory infection)  Patient presents with exacerbation of her COPD in association with URI symptoms. There is no evidence of pneumonia. She was given a nebulizer treatment in the ED and a dose of prednisone. She states she's feeling much better after this. She's talking in full sentences with no increased work of breathing. She still has some mild wheezing on exam but she states this is normal for her. She feels like she's back to baseline. She is on nasal cannula which she uses at home. She states she normally uses at night but she uses it all during the day when she's having increased COPD symptoms. She feels like she is at baseline and is ready to go home. She was encouraged to follow-up with her pulmonologist within the next 2-3 days or return here as needed if she has any worsening symptoms. She was discharged with a prescription for 4 more days of prednisone.    Malvin Johns, MD 06/09/15 1215

## 2015-06-09 NOTE — ED Notes (Addendum)
Patient states she developed a cough three days ago.  Describes cough as congested and productive with small amounts of clear secretions.  States yesterday she developed sinus congestion and drainage and bad headache.  Denies fever.

## 2015-06-09 NOTE — Discharge Instructions (Signed)
Chronic Obstructive Pulmonary Disease Exacerbation Chronic obstructive pulmonary disease (COPD) is a common lung condition in which airflow from the lungs is limited. COPD is a general term that can be used to describe many different lung problems that limit airflow, including chronic bronchitis and emphysema. COPD exacerbations are episodes when breathing symptoms become much worse and require extra treatment. Without treatment, COPD exacerbations can be life threatening, and frequent COPD exacerbations can cause further damage to your lungs. CAUSES  Respiratory infections.  Exposure to smoke.  Exposure to air pollution, chemical fumes, or dust. Sometimes there is no apparent cause or trigger. RISK FACTORS  Smoking cigarettes.  Older age.  Frequent prior COPD exacerbations. SIGNS AND SYMPTOMS  Increased coughing.  Increased thick spit (sputum) production.  Increased wheezing.  Increased shortness of breath.  Rapid breathing.  Chest tightness. DIAGNOSIS Your medical history, a physical exam, and tests will help your health care provider make a diagnosis. Tests may include:  A chest X-ray.  Basic lab tests.  Sputum testing.  An arterial blood gas test. TREATMENT Depending on the severity of your COPD exacerbation, you may need to be admitted to a hospital for treatment. Some of the treatments commonly used to treat COPD exacerbations are:   Antibiotic medicines.  Bronchodilators. These are drugs that expand the air passages. They may be given with an inhaler or nebulizer. Spacer devices may be needed to help improve drug delivery.  Corticosteroid medicines.  Supplemental oxygen therapy.  Airway clearing techniques, such as noninvasive ventilation (NIV) and positive expiratory pressure (PEP). These provide respiratory support through a mask or other noninvasive device. HOME CARE INSTRUCTIONS  Do not smoke. Quitting smoking is very important to prevent COPD from  getting worse and exacerbations from happening as often.  Avoid exposure to all substances that irritate the airway, especially to tobacco smoke.  If you were prescribed an antibiotic medicine, finish it all even if you start to feel better.  Take all medicines as directed by your health care provider.It is important to use correct technique with inhaled medicines.  Drink enough fluids to keep your urine clear or pale yellow (unless you have a medical condition that requires fluid restriction).  Use a cool mist vaporizer. This makes it easier to clear your chest when you cough.  If you have a home nebulizer and oxygen, continue to use them as directed.  Maintain all necessary vaccinations to prevent infections.  Exercise regularly.  Eat a healthy diet.  Keep all follow-up appointments as directed by your health care provider. SEEK IMMEDIATE MEDICAL CARE IF:  You have worsening shortness of breath.  You have trouble talking.  You have severe chest pain.  You have blood in your sputum.  You have a fever.  You have weakness, vomit repeatedly, or faint.  You feel confused.  You continue to get worse. MAKE SURE YOU:  Understand these instructions.  Will watch your condition.  Will get help right away if you are not doing well or get worse.   This information is not intended to replace advice given to you by your health care provider. Make sure you discuss any questions you have with your health care provider.   Document Released: 03/21/2007 Document Revised: 06/14/2014 Document Reviewed: 01/26/2013 Elsevier Interactive Patient Education 2016 Else[employer]  Upper Respiratory Infection, Adult Most upper respiratory infections (URIs) are caused by a virus. A URI affects the nose, throat, and upper air passages. The most common type of URI is often called "the  common cold." HOME CARE   Take medicines only as told by your doctor.  Gargle warm saltwater or take cough  drops to comfort your throat as told by your doctor.  Use a warm mist humidifier or inhale steam from a shower to increase air moisture. This may make it easier to breathe.  Drink enough fluid to keep your pee (urine) clear or pale yellow.  Eat soups and other clear broths.  Have a healthy diet.  Rest as needed.  Go back to work when your fever is gone or your doctor says it is okay.  You may need to stay home longer to avoid giving your URI to others.  You can also wear a face mask and wash your hands often to prevent spread of the virus.  Use your inhaler more if you have asthma.  Do not use any tobacco products, including cigarettes, chewing tobacco, or electronic cigarettes. If you need help quitting, ask your doctor. GET HELP IF:  You are getting worse, not better.  Your symptoms are not helped by medicine.  You have chills.  You are getting more short of breath.  You have brown or red mucus.  You have yellow or brown discharge from your nose.  You have pain in your face, especially when you bend forward.  You have a fever.  You have puffy (swollen) neck glands.  You have pain while swallowing.  You have white areas in the back of your throat. GET HELP RIGHT AWAY IF:   You have very bad or constant:  Headache.  Ear pain.  Pain in your forehead, behind your eyes, and over your cheekbones (sinus pain).  Chest pain.  You have long-lasting (chronic) lung disease and any of the following:  Wheezing.  Long-lasting cough.  Coughing up blood.  A change in your usual mucus.  You have a stiff neck.  You have changes in your:  Vision.  Hearing.  Thinking.  Mood. MAKE SURE YOU:   Understand these instructions.  Will watch your condition.  Will get help right away if you are not doing well or get worse.   This information is not intended to replace advice given to you by your health care provider. Make sure you discuss any questions you  have with your health care provider.   Document Released: 11/10/2007 Document Revised: 10/08/2014 Document Reviewed: 08/29/2013 Elsevier Interactive Patient Education [employer].

## 2015-06-11 ENCOUNTER — Encounter (HOSPITAL_COMMUNITY): Payer: Self-pay | Admitting: Emergency Medicine

## 2015-06-11 ENCOUNTER — Telehealth: Payer: Self-pay | Admitting: Emergency Medicine

## 2015-06-11 ENCOUNTER — Emergency Department (HOSPITAL_COMMUNITY): Payer: Medicare Other

## 2015-06-11 ENCOUNTER — Inpatient Hospital Stay (HOSPITAL_COMMUNITY)
Admission: EM | Admit: 2015-06-11 | Discharge: 2015-06-18 | DRG: 871 | Disposition: A | Discharge status: Home Health | Payer: Medicare Other | Attending: Pulmonary Disease | Admitting: Pulmonary Disease

## 2015-06-11 DIAGNOSIS — E785 Hyperlipidemia, unspecified: Secondary | ICD-10-CM | POA: Diagnosis present

## 2015-06-11 DIAGNOSIS — Z8041 Family history of malignant neoplasm of ovary: Secondary | ICD-10-CM

## 2015-06-11 DIAGNOSIS — Z87891 Personal history of nicotine dependence: Secondary | ICD-10-CM | POA: Diagnosis not present

## 2015-06-11 DIAGNOSIS — Z885 Allergy status to narcotic agent status: Secondary | ICD-10-CM

## 2015-06-11 DIAGNOSIS — A419 Sepsis, unspecified organism: Secondary | ICD-10-CM | POA: Diagnosis present

## 2015-06-11 DIAGNOSIS — Z803 Family history of malignant neoplasm of breast: Secondary | ICD-10-CM

## 2015-06-11 DIAGNOSIS — D649 Anemia, unspecified: Secondary | ICD-10-CM | POA: Diagnosis present

## 2015-06-11 DIAGNOSIS — M810 Age-related osteoporosis without current pathological fracture: Secondary | ICD-10-CM | POA: Diagnosis present

## 2015-06-11 DIAGNOSIS — J9620 Acute and chronic respiratory failure, unspecified whether with hypoxia or hypercapnia: Secondary | ICD-10-CM | POA: Diagnosis present

## 2015-06-11 DIAGNOSIS — Z825 Family history of asthma and other chronic lower respiratory diseases: Secondary | ICD-10-CM

## 2015-06-11 DIAGNOSIS — Z886 Allergy status to analgesic agent status: Secondary | ICD-10-CM

## 2015-06-11 DIAGNOSIS — J189 Pneumonia, unspecified organism: Secondary | ICD-10-CM | POA: Diagnosis not present

## 2015-06-11 DIAGNOSIS — Z86010 Personal history of colonic polyps: Secondary | ICD-10-CM

## 2015-06-11 DIAGNOSIS — Z8249 Family history of ischemic heart disease and other diseases of the circulatory system: Secondary | ICD-10-CM | POA: Diagnosis not present

## 2015-06-11 DIAGNOSIS — J13 Pneumonia due to Streptococcus pneumoniae: Secondary | ICD-10-CM | POA: Diagnosis present

## 2015-06-11 DIAGNOSIS — I2510 Atherosclerotic heart disease of native coronary artery without angina pectoris: Secondary | ICD-10-CM | POA: Diagnosis present

## 2015-06-11 DIAGNOSIS — Z923 Personal history of irradiation: Secondary | ICD-10-CM | POA: Diagnosis not present

## 2015-06-11 DIAGNOSIS — J9621 Acute and chronic respiratory failure with hypoxia: Secondary | ICD-10-CM | POA: Diagnosis present

## 2015-06-11 DIAGNOSIS — Z9981 Dependence on supplemental oxygen: Secondary | ICD-10-CM

## 2015-06-11 DIAGNOSIS — R05 Cough: Secondary | ICD-10-CM | POA: Diagnosis not present

## 2015-06-11 DIAGNOSIS — Z7952 Long term (current) use of systemic steroids: Secondary | ICD-10-CM | POA: Diagnosis not present

## 2015-06-11 DIAGNOSIS — J441 Chronic obstructive pulmonary disease with (acute) exacerbation: Secondary | ICD-10-CM | POA: Diagnosis present

## 2015-06-11 DIAGNOSIS — Z888 Allergy status to other drugs, medicaments and biological substances status: Secondary | ICD-10-CM | POA: Diagnosis not present

## 2015-06-11 DIAGNOSIS — J9690 Respiratory failure, unspecified, unspecified whether with hypoxia or hypercapnia: Secondary | ICD-10-CM | POA: Diagnosis present

## 2015-06-11 DIAGNOSIS — R0602 Shortness of breath: Secondary | ICD-10-CM | POA: Diagnosis not present

## 2015-06-11 DIAGNOSIS — F419 Anxiety disorder, unspecified: Secondary | ICD-10-CM | POA: Diagnosis present

## 2015-06-11 DIAGNOSIS — Z85118 Personal history of other malignant neoplasm of bronchus and lung: Secondary | ICD-10-CM

## 2015-06-11 DIAGNOSIS — Z808 Family history of malignant neoplasm of other organs or systems: Secondary | ICD-10-CM

## 2015-06-11 DIAGNOSIS — R000 Tachycardia, unspecified: Secondary | ICD-10-CM | POA: Diagnosis not present

## 2015-06-11 DIAGNOSIS — J440 Chronic obstructive pulmonary disease with acute lower respiratory infection: Secondary | ICD-10-CM | POA: Diagnosis present

## 2015-06-11 DIAGNOSIS — Z90710 Acquired absence of both cervix and uterus: Secondary | ICD-10-CM | POA: Diagnosis not present

## 2015-06-11 DIAGNOSIS — Z79899 Other long term (current) drug therapy: Secondary | ICD-10-CM

## 2015-06-11 DIAGNOSIS — J9811 Atelectasis: Secondary | ICD-10-CM | POA: Diagnosis not present

## 2015-06-11 LAB — COMPREHENSIVE METABOLIC PANEL
ALT: 30 U/L (ref 14–54)
AST: 26 U/L (ref 15–41)
Albumin: 4.1 g/dL (ref 3.5–5.0)
Alkaline Phosphatase: 54 U/L (ref 38–126)
Anion gap: 11 (ref 5–15)
BUN: 10 mg/dL (ref 6–20)
CHLORIDE: 99 mmol/L — AB (ref 101–111)
CO2: 28 mmol/L (ref 22–32)
Calcium: 9.6 mg/dL (ref 8.9–10.3)
Creatinine, Ser: 0.56 mg/dL (ref 0.44–1.00)
GFR calc Af Amer: >60 mL/min (ref >60)
GFR calc non Af Amer: >60 mL/min (ref >60)
Glucose, Bld: 146 mg/dL — ABNORMAL HIGH (ref 65–99)
Potassium: 4.1 mmol/L (ref 3.5–5.1)
Sodium: 138 mmol/L (ref 135–145)
Total Bilirubin: 0.9 mg/dL (ref 0.3–1.2)
Total Protein: 7.2 g/dL (ref 6.5–8.1)

## 2015-06-11 LAB — CBC WITH DIFFERENTIAL/PLATELET
BASOS ABS: 0 10*3/uL (ref 0.0–0.1)
BASOS PCT: 0 %
EOS PCT: 0 %
Eosinophils Absolute: 0 10*3/uL (ref 0.0–0.7)
HCT: 42 % (ref 36.0–46.0)
Hemoglobin: 13.4 g/dL (ref 12.0–15.0)
LYMPHS PCT: 6 %
Lymphs Abs: 1.1 10*3/uL (ref 0.7–4.0)
MCH: 28.8 pg (ref 26.0–34.0)
MCHC: 31.9 g/dL (ref 30.0–36.0)
MCV: 90.1 fL (ref 78.0–100.0)
MONOS PCT: 9 %
Monocytes Absolute: 1.5 10*3/uL — ABNORMAL HIGH (ref 0.1–1.0)
Neutro Abs: 14.3 10*3/uL — ABNORMAL HIGH (ref 1.7–7.7)
Neutrophils Relative %: 85 %
PLATELETS: 189 10*3/uL (ref 150–400)
RBC: 4.66 MIL/uL (ref 3.87–5.11)
RDW: 14.6 % (ref 11.5–15.5)
WBC: 16.9 10*3/uL — ABNORMAL HIGH (ref 4.0–10.5)

## 2015-06-11 LAB — MRSA PCR SCREENING: MRSA BY PCR: INVALID — AB

## 2015-06-11 LAB — URINALYSIS, ROUTINE W REFLEX MICROSCOPIC
BILIRUBIN URINE: NEGATIVE
KETONES UR: NEGATIVE mg/dL
Nitrite: NEGATIVE
PH: 6 (ref 5.0–8.0)
PROTEIN: NEGATIVE mg/dL
Specific Gravity, Urine: 1.018 (ref 1.005–1.030)

## 2015-06-11 LAB — BLOOD GAS, ARTERIAL
ACID-BASE DEFICIT: 1.4 mmol/L (ref 0.0–2.0)
Acid-base deficit: 2.3 mmol/L — ABNORMAL HIGH (ref 0.0–2.0)
Bicarbonate: 23.1 mEq/L (ref 20.0–24.0)
Bicarbonate: 21.9 mEq/L (ref 20.0–24.0)
Drawn by: 307971
EXPIRATORY PAP: 8
FIO2: 1
FIO2: 0.4
INSPIRATORY PAP: 15
MODE: POSITIVE
Mode: POSITIVE
O2 Saturation: 99 %
O2 Saturation: 97.1 %
PCO2 ART: 40.6 mmHg (ref 35.0–45.0)
PEEP/CPAP: 8 cmH2O
PH ART: 7.373 (ref 7.350–7.450)
Patient temperature: 98.6
Patient temperature: 98.6
Pressure support: 15 cmH2O
SAMPLE TYPE: 257701
TCO2: 21 mmol/L (ref 0–100)
TCO2: 20.1 mmol/L (ref 0–100)
pCO2 arterial: 38.1 mmHg (ref 35.0–45.0)
pH, Arterial: 7.379 (ref 7.350–7.450)
pO2, Arterial: 201 mmHg — ABNORMAL HIGH (ref 80.0–100.0)
pO2, Arterial: 98.3 mmHg (ref 80.0–100.0)

## 2015-06-11 LAB — GLUCOSE, CAPILLARY
Glucose-Capillary: 149 mg/dL — ABNORMAL HIGH (ref 65–99)
Glucose-Capillary: 158 mg/dL — ABNORMAL HIGH (ref 65–99)

## 2015-06-11 LAB — INFLUENZA PANEL BY PCR (TYPE A & B)
H1N1FLUPCR: NOT DETECTED
INFLAPCR: NEGATIVE
Influenza B By PCR: NEGATIVE

## 2015-06-11 LAB — LACTIC ACID, PLASMA: LACTIC ACID, VENOUS: 1.8 mmol/L (ref 0.5–2.0)

## 2015-06-11 LAB — CBG MONITORING, ED
GLUCOSE-CAPILLARY: 149 mg/dL — AB (ref 65–99)
GLUCOSE-CAPILLARY: 218 mg/dL — AB (ref 65–99)

## 2015-06-11 LAB — TROPONIN I: Troponin I: <0.03 ng/mL (ref <0.031)

## 2015-06-11 LAB — STREP PNEUMONIAE URINARY ANTIGEN: Strep Pneumo Urinary Antigen: POSITIVE — AB

## 2015-06-11 LAB — I-STAT CG4 LACTIC ACID, ED
LACTIC ACID, VENOUS: 2.44 mmol/L — AB (ref 0.5–2.0)
LACTIC ACID, VENOUS: 4.34 mmol/L — AB (ref 0.5–2.0)

## 2015-06-11 LAB — URINE MICROSCOPIC-ADD ON

## 2015-06-11 LAB — MAGNESIUM: Magnesium: 2.4 mg/dL (ref 1.7–2.4)

## 2015-06-11 LAB — PHOSPHORUS: PHOSPHORUS: 2.7 mg/dL (ref 2.5–4.6)

## 2015-06-11 MED ORDER — DEXTROSE 5 % IV SOLN
500.0000 mg | INTRAVENOUS | Status: DC
  Administered 2015-06-12 – 2015-06-14 (×3): 500 mg via INTRAVENOUS
  Filled 2015-06-11 (×3): qty 500

## 2015-06-11 MED ORDER — DEXTROSE 5 % IV SOLN
2.0000 g | INTRAVENOUS | Status: DC
  Administered 2015-06-11 – 2015-06-16 (×6): 2 g via INTRAVENOUS
  Filled 2015-06-11 (×7): qty 2

## 2015-06-11 MED ORDER — ALBUTEROL (5 MG/ML) CONTINUOUS INHALATION SOLN
10.0000 mg/h | INHALATION_SOLUTION | RESPIRATORY_TRACT | Status: DC
  Administered 2015-06-11: 10 mg/h via RESPIRATORY_TRACT
  Filled 2015-06-11: qty 20

## 2015-06-11 MED ORDER — BUDESONIDE 0.5 MG/2ML IN SUSP
0.5000 mg | Freq: Two times a day (BID) | RESPIRATORY_TRACT | Status: DC
  Administered 2015-06-11 – 2015-06-18 (×15): 0.5 mg via RESPIRATORY_TRACT
  Filled 2015-06-11 (×15): qty 2

## 2015-06-11 MED ORDER — HEPARIN SODIUM (PORCINE) 5000 UNIT/ML IJ SOLN
5000.0000 [IU] | Freq: Three times a day (TID) | INTRAMUSCULAR | Status: DC
  Administered 2015-06-11 – 2015-06-18 (×20): 5000 [IU] via SUBCUTANEOUS
  Filled 2015-06-11 (×24): qty 1

## 2015-06-11 MED ORDER — SODIUM CHLORIDE 0.9 % IV BOLUS (SEPSIS)
30.0000 mL/kg | Freq: Once | INTRAVENOUS | Status: AC
  Administered 2015-06-11: 605 mL via INTRAVENOUS

## 2015-06-11 MED ORDER — INSULIN ASPART 100 UNIT/ML ~~LOC~~ SOLN
0.0000 [IU] | SUBCUTANEOUS | Status: DC
  Administered 2015-06-11 (×2): 3 [IU] via SUBCUTANEOUS
  Administered 2015-06-11 – 2015-06-13 (×9): 1 [IU] via SUBCUTANEOUS
  Filled 2015-06-11: qty 1

## 2015-06-11 MED ORDER — AZITHROMYCIN 500 MG IV SOLR
500.0000 mg | Freq: Once | INTRAVENOUS | Status: AC
  Administered 2015-06-11: 500 mg via INTRAVENOUS
  Filled 2015-06-11: qty 500

## 2015-06-11 MED ORDER — ARFORMOTEROL TARTRATE 15 MCG/2ML IN NEBU
15.0000 ug | INHALATION_SOLUTION | Freq: Two times a day (BID) | RESPIRATORY_TRACT | Status: DC
  Administered 2015-06-11 – 2015-06-18 (×14): 15 ug via RESPIRATORY_TRACT
  Filled 2015-06-11 (×16): qty 2

## 2015-06-11 MED ORDER — LACTATED RINGERS IV SOLN
INTRAVENOUS | Status: DC
  Administered 2015-06-11: 11:00:00 via INTRAVENOUS

## 2015-06-11 MED ORDER — LORATADINE 10 MG PO TABS
10.0000 mg | ORAL_TABLET | Freq: Every day | ORAL | Status: DC
  Administered 2015-06-11 – 2015-06-18 (×8): 10 mg via ORAL
  Filled 2015-06-11 (×8): qty 1

## 2015-06-11 MED ORDER — CEFTRIAXONE SODIUM 1 G IJ SOLR: 1.0000 g | INTRAMUSCULAR | Status: DC

## 2015-06-11 MED ORDER — FLUTICASONE PROPIONATE 50 MCG/ACT NA SUSP
2.0000 | Freq: Two times a day (BID) | NASAL | Status: AC
  Filled 2015-06-11: qty 16

## 2015-06-11 MED ORDER — SODIUM CHLORIDE 0.9 % IV SOLN: 250.0000 mL | INTRAVENOUS | Status: DC | PRN

## 2015-06-11 MED ORDER — PANTOPRAZOLE SODIUM 40 MG IV SOLR
40.0000 mg | Freq: Every day | INTRAVENOUS | Status: DC
  Administered 2015-06-11: 40 mg via INTRAVENOUS
  Filled 2015-06-11: qty 40

## 2015-06-11 MED ORDER — ALBUTEROL SULFATE (2.5 MG/3ML) 0.083% IN NEBU
2.5000 mg | INHALATION_SOLUTION | RESPIRATORY_TRACT | Status: DC
  Administered 2015-06-11 – 2015-06-14 (×19): 2.5 mg via RESPIRATORY_TRACT
  Filled 2015-06-11 (×20): qty 3

## 2015-06-11 MED ORDER — METHYLPREDNISOLONE SODIUM SUCC 125 MG IJ SOLR
60.0000 mg | Freq: Four times a day (QID) | INTRAMUSCULAR | Status: DC
  Administered 2015-06-11 – 2015-06-15 (×16): 60 mg via INTRAVENOUS
  Filled 2015-06-11 (×17): qty 2

## 2015-06-11 MED ORDER — SODIUM CHLORIDE 0.9 % IV BOLUS (SEPSIS)
1000.0000 mL | Freq: Once | INTRAVENOUS | Status: AC
  Administered 2015-06-11: 1000 mL via INTRAVENOUS

## 2015-06-11 MED ORDER — DEXTROSE 5 % IV SOLN: 500.0000 mg | INTRAVENOUS | Status: DC

## 2015-06-11 MED ORDER — DEXTROSE 5 % IV SOLN: 1.0000 g | INTRAVENOUS | Status: DC

## 2015-06-11 MED ORDER — DEXTROSE 5 % IV SOLN
1.0000 g | Freq: Once | INTRAVENOUS | Status: AC
  Administered 2015-06-11: 1 g via INTRAVENOUS
  Filled 2015-06-11: qty 10

## 2015-06-11 NOTE — ED Provider Notes (Signed)
CSN: [id number]     Arrival date & time 06/11/15  0750 History   First MD Initiated Contact with Patient 06/11/15 [phone]     Chief Complaint  Patient presents with  . Respiratory Distress      HPI Patient presents to the emergency department in respiratory distress on BiPAP.  She has a history of COPD and asthma.  She was seen 2 days ago at [facility] and was started on prednisone for COPD exacerbation.  EMS report that when they showed up she was in severe respiratory distress and minimal air movement.  She was hypoxic.  She was started on BiPAP.  She was given Solu-Medrol, magnesium, continuous albuterol nebulized treatments.  She does wear 2 L nasal cannula continuously.  She recently increased her 2 L to 4 L.  She attempted her albuterol treatment at home today without relief.  She spoke with her pulmonologist who recommended that she come to the ER for evaluation.  No fevers or chills.  Denies productive cough.  Denies abdominal pain.  No active chest pain.  Denies unilateral leg swelling.  She is a history of lung cancer status post treatment   Past Medical History  Diagnosis Date  . Asthma   . Hx of colonic polyps   . Osteoporosis   . Hyperlipidemia   . Goiter   . Shortness of breath   . COPD (chronic obstructive pulmonary disease) ([place])   . Lung cancer, middle lobe ([place]) 04/26/2011  . Cataract     bilat.cataract/lens implant  . Allergy     seasonal pollen  . Colon adenomas   . History of radiation therapy 05/24/11-06/02/11    R middle lobe lung/ 62 gray   Past Surgical History  Procedure Laterality Date  . Total abdominal hysterectomy    . Tonsillectomy    . Eye surgery    . Colonoscopy w/ biopsies    . Biopsy thyroid  2016   Family History  Problem Relation Age of Onset  . Emphysema Maternal Uncle   . Heart disease Mother   . Heart disease Maternal Grandfather   . Rheum arthritis Sister   . Cancer Sister   . Cancer Sister     throat cancer  . Brain  cancer Sister   . Cancer Sister   . Ovarian cancer Sister   . Breast cancer Daughter    Social History  Substance Use Topics  . Smoking status: Former Smoker -- 0.50 packs/day for 40 years    Types: Cigarettes    Quit date: 06/07/1998  . Smokeless tobacco: None  . Alcohol Use: No   OB History    No data available     Review of Systems  All other systems reviewed and are negative.     Allergies  Codeine; Tiotropium bromide monohydrate; and Asa  Home Medications   Prior to Admission medications   Medication Sig Start Date End Date Taking? Authorizing Provider  albuterol (PROAIR HFA) 108 (90 BASE) MCG/ACT inhaler Inhale 2 puffs into the lungs every 6 (six) hours as needed.      Historical Provider, MD  alendronate (FOSAMAX) 70 MG tablet Take 70 mg by mouth every 7 (seven) days. Take with a full glass of water on an empty stomach.     Historical Provider, MD  atorvastatin (LIPITOR) 40 MG tablet Take 1 tablet (40 mg total) by mouth daily. 01/17/15   Skeet Latch, MD  cetirizine (ZYRTEC) 10 MG tablet Take 10 mg by  mouth daily.      Historical Provider, MD  Cholecalciferol (VITAMIN D) 2000 UNITS CAPS Take 2 capsules by mouth daily.      Historical Provider, MD  fluticasone (FLONASE) 50 MCG/ACT nasal spray Place 2 sprays into the nose as needed.     Historical Provider, MD  INCRUSE ELLIPTA 62.5 MCG/INH AEPB INHALE ONE PUFF INTO THE LUNGS ONCE DAILY 02/26/15   Brand Males, MD  mometasone-formoterol (DULERA) 100-5 MCG/ACT AERO Inhale 2 puffs into the lungs 2 (two) times daily. 10/29/14   Brand Males, MD  Multiple Vitamins-Minerals (MULTIVITAMIN WITH MINERALS) tablet Take 1 tablet by mouth daily.      Historical Provider, MD  predniSONE (DELTASONE) 20 MG tablet 1 tabs po daily x 4 days 06/09/15   Malvin Johns, MD  SINGULAIR 10 MG tablet Take 1 tablet by mouth Daily. 12/29/10   Historical Provider, MD  Spacer/Aero-Holding Chambers (AEROCHAMBER MV) inhaler Use as instructed  07/02/14   Brand Males, MD  Spacer/Aero-Holding Chambers (AEROCHAMBER MV) inhaler Use as instructed 04/29/15   Brand Males, MD   BP 146/66 mmHg  Pulse 111  Resp 24  SpO2 94% Physical Exam  Constitutional: She is oriented to person, place, and time. She appears well-developed and well-nourished. No distress.  HENT:  Head: Normocephalic and atraumatic.  Eyes: EOM are normal.  Neck: Normal range of motion.  Cardiovascular: Regular rhythm and normal heart sounds.   Tachycardia  Pulmonary/Chest:  Tachypnea.  Accessory muscle use.  Wheezing through all fields.  Decreased breath sounds in the bases.  Abdominal: Soft. She exhibits no distension. There is no tenderness.  Musculoskeletal: Normal range of motion.  Neurological: She is alert and oriented to person, place, and time.  Skin: Skin is warm and dry. There is pallor.  Psychiatric: She has a normal mood and affect. Judgment normal.  Nursing note and vitals reviewed.   ED Course  Procedures (including critical care time)  CRITICAL CARE Performed by: Hoy Morn Total critical care time: 33 minutes Critical care time was exclusive of separately billable procedures and treating other patients. Critical care was necessary to treat or prevent imminent or life-threatening deterioration. Critical care was time spent personally by me on the following activities: development of treatment plan with patient and/or surrogate as well as nursing, discussions with consultants, evaluation of patient's response to treatment, examination of patient, obtaining history from patient or surrogate, ordering and performing treatments and interventions, ordering and review of laboratory studies, ordering and review of radiographic studies, pulse oximetry and re-evaluation of patient's condition.  Labs Review Labs Reviewed  CBC WITH DIFFERENTIAL/PLATELET - Abnormal; Notable for the following:    WBC 16.9 (*)    Neutro Abs 14.3 (*)    Monocytes  Absolute 1.5 (*)    All other components within normal limits  COMPREHENSIVE METABOLIC PANEL - Abnormal; Notable for the following:    Chloride 99 (*)    Glucose, Bld 146 (*)    All other components within normal limits  I-STAT CG4 LACTIC ACID, ED - Abnormal; Notable for the following:    Lactic Acid, Venous 2.44 (*)    All other components within normal limits  CULTURE, BLOOD (ROUTINE X 2)  CULTURE, BLOOD (ROUTINE X 2)  URINE CULTURE  TROPONIN I  BLOOD GAS, ARTERIAL  URINALYSIS, ROUTINE W REFLEX MICROSCOPIC (NOT AT [facility])    Imaging Review Dg Chest 2 View  06/09/2015  CLINICAL DATA:  Cough and increased shortness of breath. History of COPD and lung cancer.  EXAM: CHEST  2 VIEW COMPARISON:  Chest x-ray dated 04/23/2011 and CT scan dated 12/20/2014 FINDINGS: Heart size is normal. Prominence of the main pulmonary arteries with peripheral pruning of the vessels consistent with pulmonary arterial hypertension. Severe emphysematous changes with hyperinflation of the lung and flattening of the diaphragm. Scarring at both lung bases. No acute osseous abnormality. Old compression deformity in the lower thoracic spine, stable. No acute infiltrates. IMPRESSION: Severe emphysema.  No acute abnormalities. Electronically Signed   By: Lorriane Shire M.D.   On: 06/09/2015 11:55   I have personally reviewed and evaluated these images and lab results as part of my medical decision-making.   EKG Interpretation   Date/Time:  Wednesday June 11 2015 07:56:51 EST Ventricular Rate:  112 PR Interval:  132 QRS Duration: 153 QT Interval:  311 QTC Calculation: 424 R Axis:   42 Text Interpretation:  Sinus tachycardia Right bundle branch block No old  tracing to compare  Poor data quality, interpretation may be adversely  affected Confirmed by Natthew Marlatt  MD, Shanetra Blumenstock ([id number]) on 06/11/2015 8:06:43 AM      MDM   Final diagnoses:  None    Patient presents with severe respiratory distress.  Ongoing work of  breathing.  Patient be started on continuous albuterol at this time.  Artery receive magnesium.  Are ready received Solu-Medrol.  Left lower lobe pneumonia noted on chest x-ray.  Patient be treated with Rocephin and azithromycin for community acquired pneumonia.  Patient be admitted to the intensive care unit.  Will 2 could sepsis called.  Blood cultures.  Mild elevation in lactate.  Blood pressure 97/50.  Will receive IV boluses at this time    Jola Schmidt, MD 06/11/15 [phone]

## 2015-06-11 NOTE — Progress Notes (Signed)
PT was transported on BiPAP at 100% Fi02.

## 2015-06-11 NOTE — Telephone Encounter (Signed)
Severe COPD followed by MR. Seen in ED 1/2 for URI sx and dyspnea, cough, sneezing. Was treated w prednisone, ? Some slight improvement in head congestion.[name] She was waked up in the middle of the night with more SOB, chest pain. Some nausea, possibly a chill. She has checked her Spo2 and has been in the 80's. She increased her O2 from 2 >> 4L/min.   Some of her sx may be related to side effects from pred, but certainly concerning for progression of AE-COPD or possibly PNA. She has not been rx with abx thus far. I have recommended to her that she go to the ED to be evaluated now. Alternatively she could be seen in our office today. She agrees to go be seen in ED. I will forward this message to Dr Chase Caller.

## 2015-06-11 NOTE — ED Notes (Signed)
md at bedside

## 2015-06-11 NOTE — ED Notes (Signed)
Per ems pt is from home, hx of COPD, asthma, and lung cancer. Reports she was seen at [facility] earlier this week for similar issue, given breathing treatment and prednisone and discharged home. Starting having SOB last night. Wears 2 L [place] continuously. Last night increased O2 to 4 L [place]. Used rescue inhaler this morning, called her doctor, was told to go to the hospital. Initially with ems lung sounds tight, given albuteral and atrovent treatment, audible wheezing then. When pt was moved from home to truck pt started gasping for air. Placed on CPAP. 125 mg solumedrol given. Magnesium sulfate 2 g IV given.  20 G L AC.  Blood pressure did drop after CPAP started. 102/44, pulse 114, 95% on CPAP, wheezing in all fields.   Albuterol '10mg'$  total atroven 1 mg total Solumedrol '125mg'$  total Magnesium 2 g total.

## 2015-06-11 NOTE — Progress Notes (Signed)
Pt removed from BIPAP and placed on 4 LPM [place].  Pt tolerating well at this time, RT to monitor and assess as needed.

## 2015-06-11 NOTE — Progress Notes (Signed)
[place] Progress Note Patient Name: Heather Barnes DOB: 17-Nov-1933 MRN: [id number]   Date of Service  06/11/2015  HPI/Events of Note  Camera check on patient with respiratory failure on BIPAP. Resting comfortably. Nods appropraitely. Hemodynamically stable. LA previously worsening.  eICU Interventions  1. Trending LA q6hr 2. Continue current plan of care.     Intervention Category Major Interventions: Respiratory failure - evaluation and management  Tera Partridge 06/11/2015, 9:09 PM

## 2015-06-11 NOTE — H&P (Signed)
Name: Heather Barnes MRN: [id number] DOB: [birth year]    ADMISSION DATE:  06/11/2015 CONSULTATION DATE:  57/4  REFERRING MD :  Venora Maples  CHIEF COMPLAINT:  Acute on chronic respiratory failure and CAP  BRIEF PATIENT DESCRIPTION:  82 year old female pt of MR, w/ sig h/o GOLD D COPD and resultant chronic respiratory failure as well as T1 N0 non-small cell lung cancer of the right middle lobe now 3 yrs out from XRT and remains in remission. Admitted 1/4 w/ working dx of CAP/AECOPD.   SIGNIFICANT EVENTS    STUDIES:     HISTORY OF PRESENT ILLNESS:    PAST MEDICAL HISTORY :  82 year old female pt of MR, w/ sig h/o GOLD D COPD and resultant chronic respiratory failure as well as T1 N0 non-small cell lung cancer of the right middle lobe now 3 yrs out from XRT and remains in remission. Seen in ED 1/2 for URI sx, dyspnea, cough, sneezing. Was treated w prednisone, ? Some slight improvement in head congestion.[name]  She was woke up in the middle of the night with more SOB, chest pain (left/pleuritic in nature)  Some nausea, possibly a chill. She has checked her Spo2 and has been in the 80's. She increased her O2 from 2 >> 4L/min. Called our on Call MD who advised her to be evaluated. In the ER was found to have LLL consolidation. Placed on NIPPV and abx started. PCCM asked to admit.    has a past medical history of Asthma; colonic polyps; Osteoporosis; Hyperlipidemia; Goiter; Shortness of breath; COPD (chronic obstructive pulmonary disease) ([place]); Lung cancer, middle lobe ([place]) (04/26/2011); Cataract; Allergy; Colon adenomas; and History of radiation therapy (05/24/11-06/02/11).  has past surgical history that includes Total abdominal hysterectomy; Tonsillectomy; Eye surgery; Colonoscopy w/ biopsies; and Biopsy thyroid (2016). Prior to Admission medications   Medication Sig Start Date End Date Taking? Authorizing Provider  albuterol (PROAIR HFA) 108 (90 BASE) MCG/ACT inhaler Inhale 2 puffs into the  lungs every 6 (six) hours as needed for wheezing or shortness of breath.    Yes Historical Provider, MD  alendronate (FOSAMAX) 70 MG tablet Take 70 mg by mouth every 7 (seven) days. Take with a full glass of water on an empty stomach. Saturday   Yes Historical Provider, MD  atorvastatin (LIPITOR) 40 MG tablet Take 1 tablet (40 mg total) by mouth daily. 01/17/15  Yes Skeet Latch, MD  cetirizine (ZYRTEC) 10 MG tablet Take 10 mg by mouth daily.     Yes Historical Provider, MD  Cholecalciferol (VITAMIN D) 2000 UNITS CAPS Take 1 capsule by mouth 2 (two) times daily.    Yes Historical Provider, MD  fluticasone (FLONASE) 50 MCG/ACT nasal spray Place 2 sprays into the nose as needed for allergies or rhinitis.    Yes Historical Provider, MD  INCRUSE ELLIPTA 62.5 MCG/INH AEPB INHALE ONE PUFF INTO THE LUNGS ONCE DAILY 02/26/15  Yes Brand Males, MD  mometasone-formoterol (DULERA) 100-5 MCG/ACT AERO Inhale 2 puffs into the lungs 2 (two) times daily. 10/29/14  Yes Brand Males, MD  Multiple Vitamins-Minerals (MULTIVITAMIN WITH MINERALS) tablet Take 1 tablet by mouth daily.     Yes Historical Provider, MD  predniSONE (DELTASONE) 20 MG tablet 1 tabs po daily x 4 days Patient taking differently: Take 20 mg by mouth daily. Started Jan 2nd x 4 days 06/09/15  Yes Malvin Johns, MD  SINGULAIR 10 MG tablet Take 10 mg by mouth Daily.  12/29/10  Yes Historical Provider, MD  Spacer/Aero-Holding Chambers (AEROCHAMBER MV) inhaler Use as instructed 04/29/15   Brand Males, MD   Allergies  Allergen Reactions  . Tiotropium Bromide Monohydrate Hives, Shortness Of Breath and Rash  . Codeine Other (See Comments)    Hyperactivity,crying  . Asa [Aspirin] Other (See Comments)    Bleeding -ulcers    FAMILY HISTORY:  family history includes Brain cancer in her sister; Breast cancer in her daughter; Cancer in her sister, sister, and sister; Emphysema in her maternal uncle; Heart disease in her maternal grandfather and  mother; Ovarian cancer in her sister; Rheum arthritis in her sister. SOCIAL HISTORY:  reports that she quit smoking about 17 years ago. Her smoking use included Cigarettes. She has a 20 pack-year smoking history. She does not have any smokeless tobacco history on file. She reports that she does not drink alcohol or use illicit drugs.  REVIEW OF SYSTEMS (BOLDS are Positive):   Constitutional: Negative for fever, chills, weight loss, malaise/fatigue and diaphoresis.  HENT: Negative for hearing loss, ear pain, nosebleeds, congestion, sore throat, neck pain, tinnitus and ear discharge.  Sneezing  Eyes: Negative for blurred vision, double vision, photophobia, pain, discharge and redness.  Respiratory:  cough yellow/green, no hemoptysis,  shortness of breath @ rest, wheezing and stridor.  left left sided CP lateral chest; pleuritic in nature  Cardiovascular: Negative for chest pain, palpitations, orthopnea, claudication, leg swelling and PND.  Gastrointestinal: Negative for heartburn, nausea, vomiting, abdominal pain, diarrhea, constipation, blood in stool and melena.  Genitourinary: Negative for dysuria, urgency, frequency, hematuria and flank pain.  Musculoskeletal: Negative for myalgias, back pain, joint pain and falls.  Skin: Negative for itching and rash.  Neurological: Negative for dizziness, tingling, tremors, sensory change, speech change, focal weakness, seizures, loss of consciousness, weakness and headaches.  Endo/Heme/Allergies: Negative for environmental allergies and polydipsia. Does not bruise/bleed easily.  SUBJECTIVE:  Feels better w/ BIPAP  VITAL SIGNS: Pulse Rate:  [97-111] 102 (01/04 0915) Resp:  [24-32] 27 (01/04 0915) BP: (112-146)/(36-66) 112/37 mmHg (01/04 0915) SpO2:  [88 %-100 %] 100 % (01/04 0915) FiO2 (%):  [100 %] 100 % (01/04 0919) Weight:  [50.803 kg (112 lb)-53.524 kg (118 lb)] 50.803 kg (112 lb) (01/04 0923)  Vent Mode:  [-] Other (Comment) FiO2 (%):  [100 %] 100  % PEEP:  [8 cmH20] 8 cmH20 Pressure Support:  [15 cmH20] 15 cmH20 No intake or output data in the 24 hours ending 06/11/15 1017 PHYSICAL EXAMINATION: General:  7 wo white female, currently on BIPAP. Feels better.  Neuro:  Awake, alert, no focal def  HEENT:  BIPAP mask in place, faint JVD, MMM Cardiovascular:  Rrr, no MRG Lungs:  Faint wheeze, equal chest rise, crackles LLL Abdomen:  Soft, not tender, no OM.  Musculoskeletal:  Equal st and bulk Skin:  Warm, dry and intact   CBC Recent Labs     06/11/15  0820  WBC  16.9*  HGB  13.4  HCT  42.0  PLT  189    Coag's No results for input(s): APTT, INR in the last 72 hours.  BMET Recent Labs     06/11/15  0820  NA  138  K  4.1  CL  99*  CO2  28  BUN  10  CREATININE  0.56  GLUCOSE  146*    Electrolytes Recent Labs     06/11/15  0820  CALCIUM  9.6    Sepsis Markers No results for input(s): PROCALCITON, O2SATVEN in the last 72 hours.  Invalid input(s): LACTICACIDVEN  ABG Recent Labs     06/11/15  0831  PHART  7.373  PCO2ART  40.6  PO2ART  201*    Liver Enzymes Recent Labs     06/11/15  0820  AST  26  ALT  30  ALKPHOS  54  BILITOT  0.9  ALBUMIN  4.1    Cardiac Enzymes Recent Labs     06/11/15  0820  TROPONINI  <0.03    Glucose Recent Labs     06/11/15  1009  GLUCAP  149*    Imaging Dg Chest 2 View  06/09/2015  CLINICAL DATA:  Cough and increased shortness of breath. History of COPD and lung cancer. EXAM: CHEST  2 VIEW COMPARISON:  Chest x-ray dated 04/23/2011 and CT scan dated 12/20/2014 FINDINGS: Heart size is normal. Prominence of the main pulmonary arteries with peripheral pruning of the vessels consistent with pulmonary arterial hypertension. Severe emphysematous changes with hyperinflation of the lung and flattening of the diaphragm. Scarring at both lung bases. No acute osseous abnormality. Old compression deformity in the lower thoracic spine, stable. No acute infiltrates. IMPRESSION:  Severe emphysema.  No acute abnormalities. Electronically Signed   By: Lorriane Shire M.D.   On: 06/09/2015 11:55   Dg Chest Portable 1 View  06/11/2015  CLINICAL DATA:  New onset severe shortness of breath last night. History of COPD and lung cancer. EXAM: PORTABLE CHEST 1 VIEW COMPARISON:  06/09/2015 FINDINGS: Code silhouette is normal in size. Thoracic aortic calcification is noted. Advanced emphysematous changes are again seen with scarring in the right lung base. There is new airspace consolidation in the basilar left lower lobe. No sizable pleural effusion or pneumothorax is identified. No acute osseous abnormality is identified. IMPRESSION: Left basilar consolidation consistent with pneumonia. Followup PA and lateral chest X-ray is recommended in 3-4 weeks following trial of antibiotic therapy to ensure resolution and exclude underlying malignancy. Electronically Signed   By: Logan Bores M.D.   On: 06/11/2015 08:41  PCXR:  LLL consolidated airspace disease   ASSESSMENT / PLAN: 1) Acute on Chronic Respiratory Failure in the Setting of CAP further c/b AECOPD (LLL) Plan Admit to ICU BIPAP PRN Maximize BDs (scheduled brovana/budesonide & scheduled SABA to transition of cont neb) Flutter valve  Systemic steroids Empiric azith and rocephin started 1/4>>> Send sputum, urine strep & legionella antigens, also RVP and rapid influenza PCR  2) Sepsis 2/2 #1 Plan Cont IVFs Repeat lactic acid Tele See above   3) h/o CAD Plan Ck 12 lead   4) mild hyperglycemia -->anticipate this will get worse w/ systemic steroids  Plan ssi     Sepsis - Repeat Assessment  Performed at:    [facility]     Blood pressure 112/37, pulse 102, resp. rate 27, weight 50.803 kg (112 lb), SpO2 100 %. Heart:     Regular rate and rhythm Lungs:    Rales and Wheezing Capillary Refill:   <2 sec Peripheral Pulse:   Radial pulse palpable Skin:     Normal Color Responding to current rx. End-organ assessment suggests  good fluid responsiveness. Will f/u lactic acid and admit to the intensive care for close f/u. Rx as outlined above  Erick Colace ACNP-BC [place] Pager # [phone] OR # [phone] if no answer   06/11/2015, 10:17 AM

## 2015-06-11 NOTE — Progress Notes (Signed)
Before transferring to ICU- RT removed PT from BiPAP- resulted in SOB and placed back on BiPAP prior to transport. ICU RT is at bedside and report was given.

## 2015-06-11 NOTE — ED Notes (Signed)
Bed: RESA Expected date:  Expected time:  Means of arrival:  Comments: ems

## 2015-06-11 NOTE — Progress Notes (Signed)
RT removed PT from BiPAP- placed on 3lpm [place]. PT quickly became SOB- RT placed back on BiPAP- MD aware.

## 2015-06-11 NOTE — ED Provider Notes (Signed)
CSN: [id number]     Arrival date & time 06/11/15  0750 History   First MD Initiated Contact with Patient 06/11/15 [phone]     Chief Complaint  Patient presents with  . Respiratory Distress     (Consider location/radiation/quality/duration/timing/severity/associated sxs/prior Treatment) HPI  Past Medical History  Diagnosis Date  . Asthma   . Hx of colonic polyps   . Osteoporosis   . Hyperlipidemia   . Goiter   . Shortness of breath   . COPD (chronic obstructive pulmonary disease) ([place])   . Lung cancer, middle lobe ([place]) 04/26/2011  . Cataract     bilat.cataract/lens implant  . Allergy     seasonal pollen  . Colon adenomas   . History of radiation therapy 05/24/11-06/02/11    R middle lobe lung/ 62 gray   Past Surgical History  Procedure Laterality Date  . Total abdominal hysterectomy    . Tonsillectomy    . Eye surgery    . Colonoscopy w/ biopsies    . Biopsy thyroid  2016   Family History  Problem Relation Age of Onset  . Emphysema Maternal Uncle   . Heart disease Mother   . Heart disease Maternal Grandfather   . Rheum arthritis Sister   . Cancer Sister   . Cancer Sister     throat cancer  . Brain cancer Sister   . Cancer Sister   . Ovarian cancer Sister   . Breast cancer Daughter    Social History  Substance Use Topics  . Smoking status: Former Smoker -- 0.50 packs/day for 40 years    Types: Cigarettes    Quit date: 06/07/1998  . Smokeless tobacco: None  . Alcohol Use: No   OB History    No data available     Review of Systems    Allergies  Tiotropium bromide monohydrate; Codeine; and [place]a  Home Medications   Prior to Admission medications   Medication Sig Start Date End Date Taking? Authorizing Provider  albuterol (PROAIR HFA) 108 (90 BASE) MCG/ACT inhaler Inhale 2 puffs into the lungs every 6 (six) hours as needed for wheezing or shortness of breath.    Yes Historical Provider, MD  alendronate (FOSAMAX) 70 MG tablet Take 70 mg by mouth every 7  (seven) days. Take with a full glass of water on an empty stomach. Saturday   Yes Historical Provider, MD  atorvastatin (LIPITOR) 40 MG tablet Take 1 tablet (40 mg total) by mouth daily. 01/17/15  Yes Skeet Latch, MD  cetirizine (ZYRTEC) 10 MG tablet Take 10 mg by mouth daily.     Yes Historical Provider, MD  Cholecalciferol (VITAMIN D) 2000 UNITS CAPS Take 1 capsule by mouth 2 (two) times daily.    Yes Historical Provider, MD  fluticasone (FLONASE) 50 MCG/ACT nasal spray Place 2 sprays into the nose as needed for allergies or rhinitis.    Yes Historical Provider, MD  INCRUSE ELLIPTA 62.5 MCG/INH AEPB INHALE ONE PUFF INTO THE LUNGS ONCE DAILY 02/26/15  Yes Brand Males, MD  mometasone-formoterol (DULERA) 100-5 MCG/ACT AERO Inhale 2 puffs into the lungs 2 (two) times daily. 10/29/14  Yes Brand Males, MD  Multiple Vitamins-Minerals (MULTIVITAMIN WITH MINERALS) tablet Take 1 tablet by mouth daily.     Yes Historical Provider, MD  predniSONE (DELTASONE) 20 MG tablet 1 tabs po daily x 4 days Patient taking differently: Take 20 mg by mouth daily. Started Jan 2nd x 4 days 06/09/15  Yes Malvin Johns, MD  SINGULAIR 10 MG  tablet Take 10 mg by mouth Daily.  12/29/10  Yes Historical Provider, MD  Spacer/Aero-Holding Chambers (AEROCHAMBER MV) inhaler Use as instructed 04/29/15   Brand Males, MD   BP 112/37 mmHg  Pulse 102  Resp 27  Wt 112 lb (50.803 kg)  SpO2 100% Physical Exam  ED Course  Procedures (including critical care time) Labs Review Labs Reviewed  CBC WITH DIFFERENTIAL/PLATELET - Abnormal; Notable for the following:    WBC 16.9 (*)    Neutro Abs 14.3 (*)    Monocytes Absolute 1.5 (*)    All other components within normal limits  COMPREHENSIVE METABOLIC PANEL - Abnormal; Notable for the following:    Chloride 99 (*)    Glucose, Bld 146 (*)    All other components within normal limits  BLOOD GAS, ARTERIAL - Abnormal; Notable for the following:    pO2, Arterial 201 (*)     All other components within normal limits  I-STAT CG4 LACTIC ACID, ED - Abnormal; Notable for the following:    Lactic Acid, Venous 2.44 (*)    All other components within normal limits  CBG MONITORING, ED - Abnormal; Notable for the following:    Glucose-Capillary 149 (*)    All other components within normal limits  CULTURE, BLOOD (ROUTINE X 2)  CULTURE, BLOOD (ROUTINE X 2)  URINE CULTURE  CULTURE, RESPIRATORY (NON-EXPECTORATED)  RESPIRATORY VIRUS PANEL  TROPONIN I  URINALYSIS, ROUTINE W REFLEX MICROSCOPIC (NOT AT [facility])  STREP PNEUMONIAE URINARY ANTIGEN  HEMOGLOBIN A1C  MAGNESIUM  PHOSPHORUS  LACTIC ACID, PLASMA  STREP PNEUMONIAE URINARY ANTIGEN  LEGIONELLA ANTIGEN, URINE  INFLUENZA PANEL BY PCR (TYPE A & B, H1N1)  I-STAT CG4 LACTIC ACID, ED    Imaging Review Dg Chest 2 View  06/09/2015  CLINICAL DATA:  Cough and increased shortness of breath. History of COPD and lung cancer. EXAM: CHEST  2 VIEW COMPARISON:  Chest x-ray dated 04/23/2011 and CT scan dated 12/20/2014 FINDINGS: Heart size is normal. Prominence of the main pulmonary arteries with peripheral pruning of the vessels consistent with pulmonary arterial hypertension. Severe emphysematous changes with hyperinflation of the lung and flattening of the diaphragm. Scarring at both lung bases. No acute osseous abnormality. Old compression deformity in the lower thoracic spine, stable. No acute infiltrates. IMPRESSION: Severe emphysema.  No acute abnormalities. Electronically Signed   By: Lorriane Shire M.D.   On: 06/09/2015 11:55   Dg Chest Portable 1 View  06/11/2015  CLINICAL DATA:  New onset severe shortness of breath last night. History of COPD and lung cancer. EXAM: PORTABLE CHEST 1 VIEW COMPARISON:  06/09/2015 FINDINGS: Code silhouette is normal in size. Thoracic aortic calcification is noted. Advanced emphysematous changes are again seen with scarring in the right lung base. There is new airspace consolidation in the basilar  left lower lobe. No sizable pleural effusion or pneumothorax is identified. No acute osseous abnormality is identified. IMPRESSION: Left basilar consolidation consistent with pneumonia. Followup PA and lateral chest X-ray is recommended in 3-4 weeks following trial of antibiotic therapy to ensure resolution and exclude underlying malignancy. Electronically Signed   By: Logan Bores M.D.   On: 06/11/2015 08:41   I have personally reviewed and evaluated these images and lab results as part of my medical decision-making.    MDM   Final diagnoses:  None    Please delete this note. I had no encounter with this patient    Orlie Dakin, MD 06/11/15 [phone]

## 2015-06-12 ENCOUNTER — Inpatient Hospital Stay (HOSPITAL_COMMUNITY): Payer: Medicare Other

## 2015-06-12 LAB — BASIC METABOLIC PANEL
Anion gap: 8 (ref 5–15)
BUN: 10 mg/dL (ref 6–20)
CHLORIDE: 107 mmol/L (ref 101–111)
CO2: 27 mmol/L (ref 22–32)
Calcium: 8.2 mg/dL — ABNORMAL LOW (ref 8.9–10.3)
Creatinine, Ser: 0.43 mg/dL — ABNORMAL LOW (ref 0.44–1.00)
GFR calc non Af Amer: >60 mL/min (ref >60)
Glucose, Bld: 135 mg/dL — ABNORMAL HIGH (ref 65–99)
Potassium: 3.8 mmol/L (ref 3.5–5.1)
SODIUM: 142 mmol/L (ref 135–145)

## 2015-06-12 LAB — CBC
HCT: 34.6 % — ABNORMAL LOW (ref 36.0–46.0)
HEMOGLOBIN: 11.1 g/dL — AB (ref 12.0–15.0)
MCH: 28.4 pg (ref 26.0–34.0)
MCHC: 32.1 g/dL (ref 30.0–36.0)
MCV: 88.5 fL (ref 78.0–100.0)
Platelets: 179 10*3/uL (ref 150–400)
RBC: 3.91 MIL/uL (ref 3.87–5.11)
RDW: 14.8 % (ref 11.5–15.5)
WBC: 16.1 10*3/uL — ABNORMAL HIGH (ref 4.0–10.5)

## 2015-06-12 LAB — GLUCOSE, CAPILLARY
GLUCOSE-CAPILLARY: 134 mg/dL — AB (ref 65–99)
GLUCOSE-CAPILLARY: 137 mg/dL — AB (ref 65–99)
GLUCOSE-CAPILLARY: 141 mg/dL — AB (ref 65–99)
GLUCOSE-CAPILLARY: 146 mg/dL — AB (ref 65–99)
Glucose-Capillary: 130 mg/dL — ABNORMAL HIGH (ref 65–99)
Glucose-Capillary: 119 mg/dL — ABNORMAL HIGH (ref 65–99)
Glucose-Capillary: 144 mg/dL — ABNORMAL HIGH (ref 65–99)

## 2015-06-12 LAB — LEGIONELLA ANTIGEN, URINE

## 2015-06-12 LAB — HEMOGLOBIN A1C
Hgb A1c MFr Bld: 5.8 % — ABNORMAL HIGH (ref 4.8–5.6)
Mean Plasma Glucose: 120 mg/dL

## 2015-06-12 LAB — MAGNESIUM: Magnesium: 2.4 mg/dL (ref 1.7–2.4)

## 2015-06-12 LAB — LACTIC ACID, PLASMA: Lactic Acid, Venous: 1 mmol/L (ref 0.5–2.0)

## 2015-06-12 LAB — PHOSPHORUS: Phosphorus: 2.2 mg/dL — ABNORMAL LOW (ref 2.5–4.6)

## 2015-06-12 MED ORDER — FUROSEMIDE 10 MG/ML IJ SOLN
40.0000 mg | Freq: Once | INTRAMUSCULAR | Status: AC
  Administered 2015-06-12: 40 mg via INTRAVENOUS
  Filled 2015-06-12: qty 4

## 2015-06-12 MED ORDER — PANTOPRAZOLE SODIUM 40 MG PO TBEC
40.0000 mg | DELAYED_RELEASE_TABLET | Freq: Two times a day (BID) | ORAL | Status: DC
  Administered 2015-06-12 – 2015-06-18 (×13): 40 mg via ORAL
  Filled 2015-06-12 (×13): qty 1

## 2015-06-12 MED ORDER — POTASSIUM PHOSPHATES 15 MMOLE/5ML IV SOLN
20.0000 mmol | Freq: Once | INTRAVENOUS | Status: AC
  Administered 2015-06-12: 20 mmol via INTRAVENOUS
  Filled 2015-06-12: qty 6.67

## 2015-06-12 NOTE — Progress Notes (Signed)
Pt currently on 3LPM [place]. No distress noted at this time.

## 2015-06-12 NOTE — Progress Notes (Signed)
Name: Heather Barnes MRN: [id number] DOB: Dec 07, 1933    ADMISSION DATE:  06/11/2015 CONSULTATION DATE:  56/4  REFERRING MD :  Venora Maples  CHIEF COMPLAINT:  Acute on chronic respiratory failure and CAP  BRIEF PATIENT DESCRIPTION:  82 year old female pt of MR, w/ sig h/o GOLD D COPD and resultant chronic respiratory failure as well as T1 N0 non-small cell lung cancer of the right middle lobe now 3 yrs out from XRT and remains in remission. Admitted 1/4 w/ working dx of CAP/AECOPD.   SIGNIFICANT EVENTS    STUDIES:   SUBJECTIVE:  Still w/ increase WOB; maybe a little better  VITAL SIGNS: Temp:  [97.8 F (36.6 C)-98.3 F (36.8 C)] 98.3 F (36.8 C) (01/04 2333) Pulse Rate:  [93-110] 103 (01/05 0800) Resp:  [15-33] 22 (01/05 0800) BP: (97-180)/(36-98) 123/96 mmHg (01/05 0800) SpO2:  [87 %-100 %] 97 % (01/05 0800) FiO2 (%):  [40 %-100 %] 40 % (01/04 1957) Weight:  [50.803 kg (112 lb)-58.3 kg (128 lb 8.5 oz)] 57.9 kg (127 lb 10.3 oz) (01/05 0304)  Vent Mode:  [-] BIPAP FiO2 (%):  [40 %-100 %] 40 % Set Rate:  [8 bmp] 8 bmp PEEP:  [5 cmH20-8 cmH20] 5 cmH20 Pressure Support:  [15 cmH20] 15 cmH20  Intake/Output Summary (Last 24 hours) at 06/12/15 0826 Last data filed at 06/12/15 0800  Gross per 24 hour  Intake   3298 ml  Output    920 ml  Net   2378 ml   PHYSICAL EXAMINATION: General:  28 wo white female, currently off BIPAP. Still w/ marked accessory muscle use.  Neuro:  Awake, alert, no focal def  HEENT:  NCAT, faint JVD, MMM Cardiovascular:  Rrr, no MRG Lungs:  Diffuse wheeze, equal chest rise, Bibasilar rales Abdomen:  Soft, not tender, no OM.  Musculoskeletal:  Equal st and bulk Skin:  Warm, dry and intact   CBC Recent Labs     06/11/15  0820  06/12/15  0350  WBC  16.9*  16.1*  HGB  13.4  11.1*  HCT  42.0  34.6*  PLT  189  179    Coag's No results for input(s): APTT, INR in the last 72 hours.  BMET Recent Labs     06/11/15  0820  06/12/15  0350  NA   138  142  K  4.1  3.8  CL  99*  107  CO2  28  27  BUN  10  10  CREATININE  0.56  0.43*  GLUCOSE  146*  135*    Electrolytes Recent Labs     06/11/15  0820  06/12/15  0350  CALCIUM  9.6  8.2*  MG  2.4  2.4  PHOS  2.7  2.2*    Sepsis Markers No results for input(s): PROCALCITON, O2SATVEN in the last 72 hours.  Invalid input(s): LACTICACIDVEN  ABG Recent Labs     06/11/15  0831  06/11/15  1812  PHART  7.373  7.379  PCO2ART  40.6  38.1  PO2ART  201*  98.3    Liver Enzymes Recent Labs     06/11/15  0820  AST  26  ALT  30  ALKPHOS  54  BILITOT  0.9  ALBUMIN  4.1    Cardiac Enzymes Recent Labs     06/11/15  0820  TROPONINI  <0.03    Glucose Recent Labs     06/11/15  1009  06/11/15  1242  06/11/15  1511  06/11/15  1950  06/11/15  2337  06/12/15  0346  GLUCAP  149*  218*  149*  158*  146*  130*    Imaging Dg Chest Port 1 View  06/12/2015  CLINICAL DATA:  Pneumonia. EXAM: PORTABLE CHEST 1 VIEW COMPARISON:  06/11/2015.  CT 12/20/2014 FINDINGS: Mediastinum hilar structures are normal. Mild cardiomegaly with mild pulmonary vascular prominence and interstitial prominence. A mild component of congestive heart failure cannot be excluded. Underlying chronic interstitial changes are present. Low lung volumes with persistent left base atelectasis and or infiltrate. New right base subsegmental atelectasis. No significant interim change. No pleural effusion or pneumothorax. IMPRESSION: 1. Cardiomegaly with mild pulmonary vascular prominence and interstitial prominence noted. Mild congestive heart failure cannot be excluded. Underlying chronic interstitial lung disease. 2. Lung volumes with left base atelectasis and/or infiltrate. No change. New right base subsegmental atelectasis . Electronically Signed   By: Marcello Moores  Register   On: 06/12/2015 07:17   Dg Chest Portable 1 View  06/11/2015  CLINICAL DATA:  New onset severe shortness of breath last night. History of COPD and  lung cancer. EXAM: PORTABLE CHEST 1 VIEW COMPARISON:  06/09/2015 FINDINGS: Code silhouette is normal in size. Thoracic aortic calcification is noted. Advanced emphysematous changes are again seen with scarring in the right lung base. There is new airspace consolidation in the basilar left lower lobe. No sizable pleural effusion or pneumothorax is identified. No acute osseous abnormality is identified. IMPRESSION: Left basilar consolidation consistent with pneumonia. Followup PA and lateral chest X-ray is recommended in 3-4 weeks following trial of antibiotic therapy to ensure resolution and exclude underlying malignancy. Electronically Signed   By: Logan Bores M.D.   On: 06/11/2015 08:41  PCXR:  LLL consolidated airspace disease   ASSESSMENT / PLAN: 1) Acute on Chronic Respiratory Failure in the Setting of CAP further c/b AECOPD (LLL)-->urine Strep Antigen is positive.  Plan Keep in ICU Keep BIPAP PRN Continue Maximize BDs (scheduled brovana/budesonide & scheduled SABA to transition of cont neb) Flutter valve  Systemic steroids (no change today) Empiric azith and rocephin started 1/4>>> F/u Send sputum, urine legionella antigens & also RVP  rapid influenza PCR Lasix X 1  2) Sepsis 2/2 #1 Plan KVO IVFs Tele See above   3) h/o CAD Plan Resume Lipitor  Cont tele   4) mild hyperglycemia -->anticipate this will get worse w/ systemic steroids  Plan ssi    5) dilutional anemia Plan Trend CBC   6) Mild hypophosphatemia  Plan Replace/recheck   U Strep antigen is positive. She is only marginally better. I think she may have a component of volume excess. Will keep her in the ICU, cont all current interventions and add 1 time dose lasix. Still think she is high risk for intubation.   Erick Colace ACNP-BC [place] Pager # [phone] OR # [phone] if no answer   06/12/2015, 8:26 AM

## 2015-06-12 NOTE — Progress Notes (Signed)
Pt refuse NIV.

## 2015-06-12 NOTE — Progress Notes (Signed)
BIPAP removed from room. Pt has not needed all day. No distress noted at this.

## 2015-06-12 NOTE — Progress Notes (Signed)
Flutter valve was given to pt. Pt knows and under stands how to use.

## 2015-06-13 ENCOUNTER — Inpatient Hospital Stay (HOSPITAL_COMMUNITY): Payer: Medicare Other

## 2015-06-13 DIAGNOSIS — J441 Chronic obstructive pulmonary disease with (acute) exacerbation: Secondary | ICD-10-CM

## 2015-06-13 DIAGNOSIS — D649 Anemia, unspecified: Secondary | ICD-10-CM

## 2015-06-13 LAB — GLUCOSE, CAPILLARY
GLUCOSE-CAPILLARY: 123 mg/dL — AB (ref 65–99)
Glucose-Capillary: 126 mg/dL — ABNORMAL HIGH (ref 65–99)

## 2015-06-13 LAB — MRSA CULTURE

## 2015-06-13 LAB — CBC
HEMATOCRIT: 37.1 % (ref 36.0–46.0)
HEMOGLOBIN: 11.8 g/dL — AB (ref 12.0–15.0)
MCH: 28.6 pg (ref 26.0–34.0)
MCHC: 31.8 g/dL (ref 30.0–36.0)
MCV: 89.8 fL (ref 78.0–100.0)
Platelets: 223 10*3/uL (ref 150–400)
RBC: 4.13 MIL/uL (ref 3.87–5.11)
RDW: 14.9 % (ref 11.5–15.5)
WBC: 17.6 10*3/uL — ABNORMAL HIGH (ref 4.0–10.5)

## 2015-06-13 LAB — URINE CULTURE

## 2015-06-13 LAB — COMPREHENSIVE METABOLIC PANEL
ALK PHOS: 54 U/L (ref 38–126)
ALT: 42 U/L (ref 14–54)
AST: 29 U/L (ref 15–41)
Albumin: 3.5 g/dL (ref 3.5–5.0)
Anion gap: 11 (ref 5–15)
BILIRUBIN TOTAL: 0.5 mg/dL (ref 0.3–1.2)
BUN: 14 mg/dL (ref 6–20)
CALCIUM: 8.5 mg/dL — AB (ref 8.9–10.3)
CO2: 27 mmol/L (ref 22–32)
CREATININE: 0.42 mg/dL — AB (ref 0.44–1.00)
Chloride: 105 mmol/L (ref 101–111)
Glucose, Bld: 140 mg/dL — ABNORMAL HIGH (ref 65–99)
Potassium: 3.9 mmol/L (ref 3.5–5.1)
Sodium: 143 mmol/L (ref 135–145)
Total Protein: 6.7 g/dL (ref 6.5–8.1)

## 2015-06-13 MED ORDER — POTASSIUM CHLORIDE CRYS ER 20 MEQ PO TBCR
40.0000 meq | EXTENDED_RELEASE_TABLET | Freq: Once | ORAL | Status: AC
  Administered 2015-06-13: 40 meq via ORAL
  Filled 2015-06-13: qty 2

## 2015-06-13 MED ORDER — ALPRAZOLAM 0.25 MG PO TABS
0.2500 mg | ORAL_TABLET | Freq: Once | ORAL | Status: AC
  Administered 2015-06-13: 0.25 mg via ORAL
  Filled 2015-06-13: qty 1

## 2015-06-13 MED ORDER — FUROSEMIDE 10 MG/ML IJ SOLN
40.0000 mg | Freq: Once | INTRAMUSCULAR | Status: AC
  Administered 2015-06-13: 40 mg via INTRAVENOUS
  Filled 2015-06-13: qty 4

## 2015-06-13 NOTE — Progress Notes (Signed)
[place] Progress Note Patient Name: Heather Barnes DOB: [birth year] MRN: [id number]   Date of Service  06/13/2015  HPI/Events of Note  Anxiety and being jitertery. Camera exam - decnnditioned lookin andreports feeling jittery  eICU Interventions  Xanax x 1 dose     Intervention Category Minor Interventions: Other:  Antonette Hendricks 06/13/2015, 5:27 PM

## 2015-06-13 NOTE — Progress Notes (Signed)
Pt resting comfortably on 5 LPM [place] at this time.  BIPAP not needed, RT to monitor and assess as needed.

## 2015-06-13 NOTE — Progress Notes (Signed)
Name: Heather Barnes MRN: [id number] DOB: 1933/09/22    ADMISSION DATE:  06/11/2015 CONSULTATION DATE:  70/4  REFERRING MD :  Venora Maples  CHIEF COMPLAINT:  Acute on chronic respiratory failure and CAP  BRIEF PATIENT DESCRIPTION:  82 year old female pt of MR, w/ sig h/o GOLD D COPD and resultant chronic respiratory failure as well as T1 N0 non-small cell lung cancer of the right middle lobe now 3 yrs out from XRT and remains in remission. Admitted 1/4 w/ working dx of CAP/AECOPD.   SIGNIFICANT EVENTS    STUDIES:   SUBJECTIVE:  Still w/ increase WOB; but a little better  VITAL SIGNS: Temp:  [97.3 F (36.3 C)-98 F (36.7 C)] 97.3 F (36.3 C) (01/06 0750) Pulse Rate:  [97-117] 104 (01/06 0900) Resp:  [17-30] 22 (01/06 0900) BP: (109-150)/(36-66) 145/59 mmHg (01/06 0600) SpO2:  [89 %-96 %] 95 % (01/06 0900) Weight:  [55.2 kg (121 lb 11.1 oz)] 55.2 kg (121 lb 11.1 oz) (01/06 0600)  4 liters     Intake/Output Summary (Last 24 hours) at 06/13/15 0945 Last data filed at 06/13/15 0920  Gross per 24 hour  Intake 1436.67 ml  Output   2300 ml  Net -863.33 ml   PHYSICAL EXAMINATION: General:  63 wo white female, currently off BIPAP. Still w/ marked accessory muscle use. But now speaking 4-5 word phrases instead of 2.  Neuro:  Awake, alert, no focal def  HEENT:  NCAT, faint JVD, MMM Cardiovascular:  Rrr, no MRG Lungs:  Diffuse wheeze, equal chest rise, Crackles left posterior base  Abdomen:  Soft, not tender, no OM.  Musculoskeletal:  Equal st and bulk Skin:  Warm, dry and intact   CBC Recent Labs     06/11/15  0820  06/12/15  0350  06/13/15  0346  WBC  16.9*  16.1*  17.6*  HGB  13.4  11.1*  11.8*  HCT  42.0  34.6*  37.1  PLT  189  179  223    Coag's No results for input(s): APTT, INR in the last 72 hours.  BMET Recent Labs     06/11/15  0820  06/12/15  0350  06/13/15  0346  NA  138  142  143  K  4.1  3.8  3.9  CL  99*  107  105  CO2  '28  27  27  '$ BUN  '10   10  14  '$ CREATININE  0.56  0.43*  0.42*  GLUCOSE  146*  135*  140*    Electrolytes Recent Labs     06/11/15  0820  06/12/15  0350  06/13/15  0346  CALCIUM  9.6  8.2*  8.5*  MG  2.4  2.4   --   PHOS  2.7  2.2*   --     Sepsis Markers No results for input(s): PROCALCITON, O2SATVEN in the last 72 hours.  Invalid input(s): LACTICACIDVEN  ABG Recent Labs     06/11/15  0831  06/11/15  1812  PHART  7.373  7.379  PCO2ART  40.6  38.1  PO2ART  201*  98.3    Liver Enzymes Recent Labs     06/11/15  0820  06/13/15  0346  AST  26  29  ALT  30  42  ALKPHOS  54  54  BILITOT  0.9  0.5  ALBUMIN  4.1  3.5    Cardiac Enzymes Recent Labs     06/11/15  0820  TROPONINI  <0.03    Glucose Recent Labs     06/12/15  1111  06/12/15  1632  06/12/15  2012  06/12/15  2350  06/13/15  0356  06/13/15  0800  GLUCAP  141*  119*  144*  134*  126*  123*    Imaging Dg Chest Port 1 View  06/13/2015  CLINICAL DATA:  Pneumonia.  Followup.  Short of breath and cough. EXAM: PORTABLE CHEST 1 VIEW COMPARISON:  06/11/2014 and multiple previous FINDINGS: Patchy infiltrate and volume loss at both lung bases process, left worse than right. No worsening since the previous radiograph. No new finding. No effusion. No lobar collapse. IMPRESSION: Persistent patchy infiltrates in the lower lobes left worse than right consistent with bronchopneumonia. No worsening or new finding. Electronically Signed   By: Nelson Chimes M.D.   On: 06/13/2015 07:25   Dg Chest Port 1 View  06/12/2015  CLINICAL DATA:  Pneumonia. EXAM: PORTABLE CHEST 1 VIEW COMPARISON:  06/11/2015.  CT 12/20/2014 FINDINGS: Mediastinum hilar structures are normal. Mild cardiomegaly with mild pulmonary vascular prominence and interstitial prominence. A mild component of congestive heart failure cannot be excluded. Underlying chronic interstitial changes are present. Low lung volumes with persistent left base atelectasis and or infiltrate. New right  base subsegmental atelectasis. No significant interim change. No pleural effusion or pneumothorax. IMPRESSION: 1. Cardiomegaly with mild pulmonary vascular prominence and interstitial prominence noted. Mild congestive heart failure cannot be excluded. Underlying chronic interstitial lung disease. 2. Lung volumes with left base atelectasis and/or infiltrate. No change. New right base subsegmental atelectasis . Electronically Signed   By: Marcello Moores  Register   On: 06/12/2015 07:17  PCXR:  LLL consolidated airspace disease -->no sig change   ASSESSMENT / PLAN: 1) Acute on Chronic Respiratory Failure in the Setting of CAP further c/b AECOPD ([facility])-->urine Strep Antigen is positive.  >CXR stable. Clinically a little better today but still w/ sig dyspnea.  Plan Change to SDU setting Keep BIPAP PRN Wean O2 Continue Maximize BDs (scheduled brovana/budesonide & scheduled SABA to transition of cont neb) Flutter valve  Mobilize  Systemic steroids (change to q8) Empiric azith 1/4>>> rocephin 1/4>>> F/u Send sputum & also RVP  Lasix X 1   2) h/o CAD Plan Resume Lipitor  Cont tele   3) dilutional anemia Plan Trend CBC    U Strep antigen is positive. She is better today but still only able to speak short phrases. I think the lasix helped yesterday; will repeat it again today. I think her work of breathing still requires higher level of monitoring so we will change her to SDU setting. Will continue to focus on pulmonary hygiene, aggressive ABX, cont BDs and systemic steroids. Will ask PT/OT to see her. Think OT would be helpful to a/w energy conservation strategies after d/c. Of note she was on pulsed O2 at home. Think she needs continuous from here on out.   Erick Colace ACNP-BC [place] Pager # [phone] OR # [phone] if no answer   06/13/2015, 9:45 AM

## 2015-06-13 NOTE — Evaluation (Signed)
Physical Therapy Evaluation Patient Details Name: Heather Barnes MRN: [id number] DOB: 01-17-1934 Today's Date: 06/13/2015   History of Present Illness  Pt is a 82 year old female with hx of COPD, asthma, non-small cell lung cancer of the right middle lobe  and admitted for acute on Chronic Respiratory Failure in the Setting of CAP further c/b AECOPD ([facility])  Clinical Impression  Pt admitted with above diagnosis. Pt currently with functional limitations due to the deficits listed below (see PT Problem List).  Pt will benefit from skilled PT to increase their independence and safety with mobility to allow discharge to the venue listed below.   Pt reports baseline 2L O2 at home and currently requiring 5L O2 today.  Pt only agreeable to OOB to [facility] and recliner due to respiratory status.  Pt states she is very self aware and feels unable to tolerate ambulating today.  Daughter lives with patient and can assist upon d/c.     Follow Up Recommendations Home health PT;Supervision for mobility/OOB    Equipment Recommendations  Rolling walker with 5" wheels    Recommendations for Other Services       Precautions / Restrictions Precautions Precautions: Fall Precaution Comments: monitor sats      Mobility  Bed Mobility               General bed mobility comments: pt sitting EOB with NT on arrival  Transfers Overall transfer level: Needs assistance Equipment used: 2 person hand held assist Transfers: Sit to/from Stand;Stand Pivot Transfers Sit to Stand: Min assist Stand pivot transfers: Min assist       General transfer comment: verbal cues of safe technique, pt reports she does not use assistive device at baseline however required 2 HHA, may try RW for gait for energy conservation - pt aware - on next visit, assisted bed to [facility] to recliner, encouraged pursed lip breathing with SOB, SPO2 remained 91-92% on 5L O2 [place], also discussed minimal talking to conserve energy especially with  mobility  Ambulation/Gait Ambulation/Gait assistance:  (pt declined today, "I know my limits")              Stairs            Wheelchair Mobility    Modified Rankin (Stroke Patients Only)       Balance                                             Pertinent Vitals/Pain Pain Assessment: No/denies pain    Home Living Family/patient expects to be discharged to:: Private residence Living Arrangements: Children (daughter)   Type of Home: House Home Access: Stairs to enter   Technical brewer of Steps: 4 Home Layout: One level Home Equipment: Transport chair Additional Comments: chronic 2L O2    Prior Function Level of Independence: Independent         Comments: uses transport chair for community     Hand Dominance        Extremity/Trunk Assessment               Lower Extremity Assessment: Generalized weakness         Communication   Communication: HOH  Cognition Arousal/Alertness: Awake/alert Behavior During Therapy: WFL for tasks assessed/performed Overall Cognitive Status: Within Functional Limits for tasks assessed  General Comments      Exercises        Assessment/Plan    PT Assessment Patient needs continued PT services  PT Diagnosis Difficulty walking;Generalized weakness   PT Problem List Decreased strength;Decreased activity tolerance;Decreased mobility;Decreased balance;Decreased knowledge of use of DME;Cardiopulmonary status limiting activity  PT Treatment Interventions DME instruction;Gait training;Functional mobility training;Stair training;Patient/family education;Therapeutic activities;Therapeutic exercise   PT Goals (Current goals can be found in the Care Plan section) Acute Rehab PT Goals PT Goal Formulation: With patient Time For Goal Achievement: 06/20/15 Potential to Achieve Goals: Good    Frequency Min 3X/week   Barriers to discharge         Co-evaluation               End of Session Equipment Utilized During Treatment: Oxygen Activity Tolerance: Patient limited by fatigue Patient left: in chair;with call bell/phone within reach;with chair alarm set;with nursing/sitter in room           Time: 2297-9892 PT Time Calculation (min) (ACUTE ONLY): 17 min   Charges:   PT Evaluation $PT Eval Moderate Complexity: 1 Procedure     PT G Codes:        Heather Barnes,Heather Barnes 06/13/2015, 12:33 PM Carmelia Bake, PT, DPT 06/13/2015 Pager: [phone]

## 2015-06-14 LAB — RESPIRATORY VIRUS PANEL
ADENOVIRUS: NEGATIVE
INFLUENZA A: NEGATIVE
Influenza B: NEGATIVE
Metapneumovirus: NEGATIVE
PARAINFLUENZA 3 A: NEGATIVE
Parainfluenza 1: NEGATIVE
Parainfluenza 2: NEGATIVE
RESPIRATORY SYNCYTIAL VIRUS A: NEGATIVE
Respiratory Syncytial Virus B: NEGATIVE
Rhinovirus: NEGATIVE

## 2015-06-14 MED ORDER — ALPRAZOLAM 0.25 MG PO TABS
0.2500 mg | ORAL_TABLET | Freq: Two times a day (BID) | ORAL | Status: DC | PRN
Start: 2015-06-14 — End: 2015-06-18
  Administered 2015-06-14: 0.25 mg via ORAL
  Filled 2015-06-14: qty 1

## 2015-06-14 MED ORDER — LEVALBUTEROL HCL 0.63 MG/3ML IN NEBU
0.6300 mg | INHALATION_SOLUTION | RESPIRATORY_TRACT | Status: DC
  Administered 2015-06-14 – 2015-06-18 (×23): 0.63 mg via RESPIRATORY_TRACT
  Filled 2015-06-14 (×23): qty 3

## 2015-06-14 NOTE — Progress Notes (Signed)
Name: Heather Barnes MRN: [id number] DOB: 06-12-1933    ADMISSION DATE:  06/11/2015 CONSULTATION DATE:  28/4  REFERRING MD :  Venora Maples  CHIEF COMPLAINT:  Acute on chronic respiratory failure and CAP  BRIEF PATIENT DESCRIPTION:  82 year old female pt of MR, w/ sig h/o GOLD D COPD and resultant chronic respiratory failure as well as T1 N0 non-small cell lung cancer of the right middle lobe now 3 yrs out from XRT and remains in remission. Admitted 1/4 w/ working dx of CAP/AECOPD.   SIGNIFICANT EVENTS    STUDIES:   SUBJECTIVE:  Feels a little better  C/o anxiety , jittery  VITAL SIGNS: Temp:  [97.5 F (36.4 C)-98.3 F (36.8 C)] 97.6 F (36.4 C) (01/07 0800) Pulse Rate:  [88-125] 112 (01/07 1000) Resp:  [19-36] 26 (01/07 1000) BP: (125-144)/(48-83) 139/66 mmHg (01/07 1000) SpO2:  [89 %-98 %] 94 % (01/07 1000) Weight:  [54.6 kg (120 lb 5.9 oz)] 54.6 kg (120 lb 5.9 oz) (01/07 0500)      Intake/Output Summary (Last 24 hours) at 06/14/15 1036 Last data filed at 06/14/15 1000  Gross per 24 hour  Intake    630 ml  Output    352 ml  Net    278 ml   PHYSICAL EXAMINATION: General:  60 wo white female, on [place] Still w/ accessory muscle use. Able to speak in full sentences Neuro:  Awake, alert, no focal def  HEENT:  NCAT, faint JVD, MMM Cardiovascular:  Rrr, no MRG Lungs:  Diffuse wheeze, equal chest rise, Crackles left posterior base  Abdomen:  Soft, not tender, no OM.  Musculoskeletal:  Equal st and bulk Skin:  Warm, dry and intact   CBC Recent Labs     06/12/15  0350  06/13/15  0346  WBC  16.1*  17.6*  HGB  11.1*  11.8*  HCT  34.6*  37.1  PLT  179  223    Coag's No results for input(s): APTT, INR in the last 72 hours.  BMET Recent Labs     06/12/15  0350  06/13/15  0346  NA  142  143  K  3.8  3.9  CL  107  105  CO2  27  27  BUN  10  14  CREATININE  0.43*  0.42*  GLUCOSE  135*  140*    Electrolytes Recent Labs     06/12/15  0350  06/13/15  0346    CALCIUM  8.2*  8.5*  MG  2.4   --   PHOS  2.2*   --     Sepsis Markers No results for input(s): PROCALCITON, O2SATVEN in the last 72 hours.  Invalid input(s): LACTICACIDVEN  ABG Recent Labs     06/11/15  1812  PHART  7.379  PCO2ART  38.1  PO2ART  98.3    Liver Enzymes Recent Labs     06/13/15  0346  AST  29  ALT  42  ALKPHOS  54  BILITOT  0.5  ALBUMIN  3.5    Cardiac Enzymes No results for input(s): TROPONINI, PROBNP in the last 72 hours.  Glucose Recent Labs     06/12/15  1111  06/12/15  1632  06/12/15  2012  06/12/15  2350  06/13/15  0356  06/13/15  0800  GLUCAP  141*  119*  144*  134*  126*  123*    Imaging Dg Chest Port 1 View  06/13/2015  CLINICAL DATA:  Pneumonia.  Followup.  Short of breath and cough. EXAM: PORTABLE CHEST 1 VIEW COMPARISON:  06/11/2014 and multiple previous FINDINGS: Patchy infiltrate and volume loss at both lung bases process, left worse than right. No worsening since the previous radiograph. No new finding. No effusion. No lobar collapse. IMPRESSION: Persistent patchy infiltrates in the lower lobes left worse than right consistent with bronchopneumonia. No worsening or new finding. Electronically Signed   By: Nelson Chimes M.D.   On: 06/13/2015 07:25    ASSESSMENT / PLAN: 1) Acute on Chronic Respiratory Failure in the Setting of CAP further c/b AECOPD (LLL)-->urine Strep Antigen is positive.  >CXR stable. C Plan Change to SDU setting Keep BIPAP PRN Wean O2 Continue Maximize BDs (scheduled brovana/budesonide & scheduled SABA to transition of cont neb) Flutter valve  Mobilize  IV solumedrol  (change to q8) Empiric azith 1/4>>> rocephin 1/4>>> F/u  sputum cx , RVP neg   2) h/o CAD Plan Resume Lipitor  Cont tele   3) dilutional anemia Plan Trend CBC   Add xanax for anxiety  U Strep antigen is positive. -her work of breathing still requires higher level of monitoring so we will change her to SDU setting. Will continue  to focus on pulmonary hygiene, aggressive ABX, cont BDs and systemic steroids. Of note she was on pulsed O2 at home.  Will need HHPT  Paublo Warshawsky V. MD   06/14/2015, 10:36 AM

## 2015-06-15 MED ORDER — METHYLPREDNISOLONE SODIUM SUCC 125 MG IJ SOLR
60.0000 mg | Freq: Three times a day (TID) | INTRAMUSCULAR | Status: DC
  Administered 2015-06-15 – 2015-06-16 (×2): 60 mg via INTRAVENOUS
  Filled 2015-06-15 (×2): qty 2

## 2015-06-15 MED ORDER — AZITHROMYCIN 250 MG PO TABS
500.0000 mg | ORAL_TABLET | Freq: Every day | ORAL | Status: AC
  Administered 2015-06-15 – 2015-06-16 (×2): 500 mg via ORAL
  Filled 2015-06-15 (×2): qty 2

## 2015-06-15 NOTE — Progress Notes (Signed)
Name: Heather Barnes MRN: [id number] DOB: 07/22/1933    ADMISSION DATE:  06/11/2015 CONSULTATION DATE:  46/4  REFERRING MD :  Venora Maples  CHIEF COMPLAINT:  Acute on chronic respiratory failure and CAP  BRIEF PATIENT DESCRIPTION:  82 year old female pt of MR, w/ sig h/o GOLD D COPD and resultant chronic respiratory failure as well as T1 N0 non-small cell lung cancer of the right middle lobe now 3 yrs out from XRT and remains in remission. Admitted 1/4 w/ working dx of CAP/AECOPD.   SIGNIFICANT EVENTS    STUDIES:   SUBJECTIVE:  Feels a little better  Less anxious with xanax slept some  VITAL SIGNS: Temp:  [97.8 F (36.6 C)-98.3 F (36.8 C)] 97.8 F (36.6 C) (01/08 0816) Pulse Rate:  [75-114] 79 (01/08 0600) Resp:  [17-27] 21 (01/08 0600) BP: (104-154)/(39-88) 104/44 mmHg (01/08 0600) SpO2:  [91 %-96 %] 92 % (01/08 0806) Weight:  [54.6 kg (120 lb 5.9 oz)] 54.6 kg (120 lb 5.9 oz) (01/08 0403)      Intake/Output Summary (Last 24 hours) at 06/15/15 1014 Last data filed at 06/15/15 0038  Gross per 24 hour  Intake    650 ml  Output      1 ml  Net    649 ml   PHYSICAL EXAMINATION: General:  32 wo white female, on [place] Still w/ accessory muscle use. Able to speak in full sentences Neuro:  Awake, alert, no focal def  HEENT:  NCAT, faint JVD, MMM Cardiovascular:  Rrr, no MRG Lungs:  Diffuse wheeze, equal chest rise, Crackles left posterior base  Abdomen:  Soft, not tender, no OM.  Musculoskeletal:  Equal st and bulk Skin:  Warm, dry and intact   CBC Recent Labs     06/13/15  0346  WBC  17.6*  HGB  11.8*  HCT  37.1  PLT  223    Coag's No results for input(s): APTT, INR in the last 72 hours.  BMET Recent Labs     06/13/15  0346  NA  143  K  3.9  CL  105  CO2  27  BUN  14  CREATININE  0.42*  GLUCOSE  140*    Electrolytes Recent Labs     06/13/15  0346  CALCIUM  8.5*    Sepsis Markers No results for input(s): PROCALCITON, O2SATVEN in the last 72  hours.  Invalid input(s): LACTICACIDVEN  ABG No results for input(s): PHART, PCO2ART, PO2ART in the last 72 hours.  Liver Enzymes Recent Labs     06/13/15  0346  AST  29  ALT  42  ALKPHOS  54  BILITOT  0.5  ALBUMIN  3.5    Cardiac Enzymes No results for input(s): TROPONINI, PROBNP in the last 72 hours.  Glucose Recent Labs     06/12/15  1111  06/12/15  1632  06/12/15  2012  06/12/15  2350  06/13/15  0356  06/13/15  0800  GLUCAP  141*  119*  144*  134*  126*  123*    Imaging No results found.  ASSESSMENT / PLAN: 1) Acute on Chronic Respiratory Failure in the Setting of CAP further c/b AECOPD (LLL)-->urine Strep Antigen is positive.  >CXR stable. C Plan Change to tele setting Keep BIPAP PRN Wean O2 Continue Maximize BDs (scheduled brovana/budesonide & scheduled SABA to transition of cont neb) Flutter valve  Mobilize  IV solumedrol  (change to q12) Empiric azith 1/4>>>1/8 rocephin 1/4>>> F/u  sputum  cx , RVP neg   2) h/o CAD Plan Resume Lipitor  Cont tele   3) dilutional anemia Plan Trend CBC   Add xanax for anxiety  U Strep antigen is positive.  Of note she was on pulsed O2 at home.  Will need HHPT  Rogers Ditter V. MD   06/15/2015, 10:14 AM

## 2015-06-16 LAB — CULTURE, BLOOD (ROUTINE X 2)
CULTURE: NO GROWTH
Culture: NO GROWTH

## 2015-06-16 MED ORDER — METHYLPREDNISOLONE SODIUM SUCC 125 MG IJ SOLR
60.0000 mg | Freq: Two times a day (BID) | INTRAMUSCULAR | Status: DC
  Administered 2015-06-16 – 2015-06-17 (×2): 60 mg via INTRAVENOUS
  Filled 2015-06-16 (×2): qty 2

## 2015-06-16 NOTE — Evaluation (Signed)
Occupational Therapy Evaluation Patient Details Name: Heather Barnes MRN: [id number] DOB: [birth year] Today's Date: 06/16/2015    History of Present Illness Pt is a 82 year old female with hx of COPD, asthma, non-small cell lung cancer of the right middle lobe  and admitted for acute on Chronic Respiratory Failure in the Setting of CAP further c/b AECOPD (LLL)   Clinical Impression   Pt admitted with RF. Pt currently with functional limitations due to the deficits listed below (see OT Problem List).  Pt will benefit from skilled OT to increase their safety and independence with ADL and functional mobility for ADL to facilitate discharge to venue listed below.      Follow Up Recommendations  Home health OT;Supervision/Assistance - 24 hour    Equipment Recommendations  None recommended by OT;3 in 1 bedside comode       Precautions / Restrictions Precautions Precautions: Fall      Mobility Bed Mobility Overal bed mobility: Needs Assistance Bed Mobility: Supine to Sit     Supine to sit: Min assist        Transfers   Equipment used: 1 person hand held assist Transfers: Sit to/from [facility] Sit to Stand: Min assist Stand pivot transfers: Min assist                 ADL Overall ADL's : Needs assistance/impaired Eating/Feeding: Set up;Sitting   Grooming: Set up;Sitting   Upper Body Bathing: Set up;Sitting   Lower Body Bathing: Moderate assistance;Sit to/from stand;Cueing for sequencing   Upper Body Dressing : Set up;Sitting   Lower Body Dressing: Moderate assistance;Sit to/from stand;Cueing for sequencing;Cueing for safety   Toilet Transfer: Minimal assistance;BSC;Stand-pivot;Cueing for safety   Toileting- Clothing Manipulation and Hygiene: Moderate assistance;Sit to/from stand;Cueing for sequencing;Cueing for safety       Functional mobility during ADLs: Cueing for safety General ADL Comments: pt lives with daugther  who can provide  A as needed               Pertinent Vitals/Pain Pain Assessment: No/denies pain     Hand Dominance     Extremity/Trunk Assessment Upper Extremity Assessment Upper Extremity Assessment: Generalized weakness           Communication Communication Communication: HOH   Cognition Arousal/Alertness: Awake/alert Behavior During Therapy: WFL for tasks assessed/performed Overall Cognitive Status: Within Functional Limits for tasks assessed                                Home Living Family/patient expects to be discharged to:: Private residence Living Arrangements: Children (daughter)   Type of Home: House Home Access: Stairs to enter Technical brewer of Steps: 4   Home Layout: One level     Bathroom Shower/Tub: Teacher, early years/pre: Standard     Home Equipment: Transport chair   Additional Comments: chronic 2L O2      Prior Functioning/Environment Level of Independence: Independent        Comments: uses transport chair for community    OT Diagnosis: Generalized weakness   OT Problem List: Decreased strength;Decreased activity tolerance   OT Treatment/Interventions: Self-care/ADL training;DME and/or AE instruction;Visual/perceptual remediation/compensation;Energy conservation    OT Goals(Current goals can be found in the care plan section) Acute Rehab OT Goals Patient Stated Goal: get stronger OT Goal Formulation: With patient Time For Goal Achievement: 06/30/15 Potential to Achieve Goals: Good  OT Frequency: Min 2X/week  End of Session Nurse Communication: Mobility status  Activity Tolerance: Patient tolerated treatment well;Patient limited by fatigue Patient left:  in chair with call bell with in reach   Time: 1148-1210 OT Time Calculation (min): 22 min Charges:  OT General Charges $OT Visit: 1 Procedure OT Evaluation $OT Eval Low Complexity: 1 Procedure G-Codes:    Betsy Pries 07/02/15,  12:14 PM

## 2015-06-16 NOTE — Progress Notes (Signed)
   Name: Heather Barnes MRN: [id number] DOB: 12-May-1934    ADMISSION DATE:  06/11/2015 CONSULTATION DATE:  69/4  REFERRING MD :  Venora Maples  CHIEF COMPLAINT:  Acute on chronic respiratory failure and CAP  BRIEF PATIENT DESCRIPTION:  82 year old female pt of MR, w/ sig h/o GOLD D COPD and resultant chronic respiratory failure as well as T1 N0 non-small cell lung cancer of the right middle lobe now 3 yrs out from XRT and remains in remission. Admitted 1/4 w/ working dx of CAP/AECOPD.   SIGNIFICANT EVENTS    STUDIES:   SUBJECTIVE:  Feels  better  slept better in new room  VITAL SIGNS: Temp:  [97.4 F (36.3 C)-98.1 F (36.7 C)] 97.4 F (36.3 C) (01/09 0517) Pulse Rate:  [81-118] 90 (01/09 0517) Resp:  [18-24] 22 (01/09 0517) BP: (129-146)/(65-82) 129/65 mmHg (01/09 0517) SpO2:  [94 %-97 %] 96 % (01/09 0812) Weight:  [50.9 kg (112 lb 3.4 oz)] 50.9 kg (112 lb 3.4 oz) (01/09 0517)      Intake/Output Summary (Last 24 hours) at 06/16/15 1244 Last data filed at 06/16/15 0900  Gross per 24 hour  Intake    510 ml  Output    150 ml  Net    360 ml   PHYSICAL EXAMINATION: General:  elderlywhite female, on [place] Still w/ accessory muscle use. Able to speak in full sentences Neuro:  Awake, alert, no focal def  HEENT:  NCAT, faint JVD, MMM Cardiovascular:  Rrr, no MRG Lungs:  Diffuse wheeze, equal chest rise, Crackles left posterior base  Abdomen:  Soft, not tender, no OM.  Musculoskeletal:  Equal st and bulk Skin:  Warm, dry and intact   CBC No results for input(s): WBC, HGB, HCT, PLT in the last 72 hours.  Coag's No results for input(s): APTT, INR in the last 72 hours.  BMET No results for input(s): NA, K, CL, CO2, BUN, CREATININE, GLUCOSE in the last 72 hours.  Electrolytes No results for input(s): CALCIUM, MG, PHOS in the last 72 hours.  Sepsis Markers No results for input(s): PROCALCITON, O2SATVEN in the last 72 hours.  Invalid input(s): LACTICACIDVEN  ABG No  results for input(s): PHART, PCO2ART, PO2ART in the last 72 hours.  Liver Enzymes No results for input(s): AST, ALT, ALKPHOS, BILITOT, ALBUMIN in the last 72 hours.  Cardiac Enzymes No results for input(s): TROPONINI, PROBNP in the last 72 hours.  Glucose No results for input(s): GLUCAP in the last 72 hours.  Imaging No results found.  ASSESSMENT / PLAN: 1) Acute on Chronic Respiratory Failure in the Setting of CAP further c/b AECOPD (LLL)-->urine Strep Antigen is positive.  >CXR stable. Plan Change to tele setting Wean O2 Continue Maximize BDs (scheduled brovana/budesonide & scheduled SABA to transition of cont neb) Flutter valve  Mobilize  IV solumedrol  (change to q24) Empiric azith 1/4>>>1/8 rocephin 1/4>>>  RVP neg   2) h/o CAD Plan Resume Lipitor  Cont tele   3) dilutional anemia Plan Trend CBC   Ct xanax for anxiety  U Strep antigen is positive.  Of note she was on pulsed O2 at home.  Will need HHPT/ HHOT  Holden Draughon V. MD   06/16/2015, 12:44 PM

## 2015-06-16 NOTE — Progress Notes (Signed)
Spoke with MD regarding patient's loose stools. MD stated no need to test for c.diff at this time.  Precautions d/c. Will continue to monitor stool and notify MD with changes.

## 2015-06-17 MED ORDER — METHYLPREDNISOLONE SODIUM SUCC 125 MG IJ SOLR
60.0000 mg | Freq: Every day | INTRAMUSCULAR | Status: DC
  Administered 2015-06-18: 60 mg via INTRAVENOUS
  Filled 2015-06-17: qty 2

## 2015-06-17 NOTE — Progress Notes (Signed)
Physical Therapy Treatment Patient Details Name: Heather Barnes MRN: [id number] DOB: 04/10/1934 Today's Date: 06/17/2015    History of Present Illness Pt is a 82 year old female with hx of COPD, asthma, non-small cell lung cancer of the right middle lobe  and admitted for acute on Chronic Respiratory Failure in the Setting of CAP further c/b AECOPD (LLL)    PT Comments    Pt ambulated 29' with RW with supervision, no loss of balance. SaO2 92% on 3L O2 walking, 3/4 dyspnea. Rollator (4 wheeled walker) recommended for energy conservation at home.   Follow Up Recommendations  Home health PT;Supervision for mobility/OOB     Equipment Recommendations  Other (comment) (Rollator (4 wheeled RW))    Recommendations for Other Services       Precautions / Restrictions Precautions Precautions: Fall Precaution Comments: monitor sats Restrictions Weight Bearing Restrictions: No    Mobility  Bed Mobility    independent sit to supine           General bed mobility comments: pt sitting in recliner  Transfers Overall transfer level: Needs assistance Equipment used: 1 person hand held assist Transfers: Sit to/from Stand Sit to Stand: Min assist         General transfer comment: min A to steady  Ambulation/Gait Ambulation/Gait assistance: Supervision Ambulation Distance (Feet): 60 Feet Assistive device: 1 person hand held assist;Rolling walker (2 wheeled) Gait Pattern/deviations: Decreased step length - right;Decreased step length - left;Step-through pattern   Gait velocity interpretation: Below normal speed for age/gender General Gait Details: SaO2 92% on 3L O2 with walking, 3/4 dyspnea; pt ambulated 15' x 2 with 1 person HHA, then 33' with RW, no loss of balance. Seated rest break between trials of ambulation.    Stairs            Wheelchair Mobility    Modified Rankin (Stroke Patients Only)       Balance Overall balance assessment: Modified Independent                                  Cognition Arousal/Alertness: Awake/alert Behavior During Therapy: WFL for tasks assessed/performed Overall Cognitive Status: Within Functional Limits for tasks assessed                      Exercises      General Comments        Pertinent Vitals/Pain Pain Assessment: No/denies pain    Home Living                      Prior Function            PT Goals (current goals can now be found in the care plan section) Acute Rehab PT Goals Patient Stated Goal: get stronger, to be able to handle going to the bathroom and cooking independently PT Goal Formulation: With patient Time For Goal Achievement: 06/20/15 Potential to Achieve Goals: Good Progress towards PT goals: Progressing toward goals    Frequency  Min 3X/week    PT Plan Current plan remains appropriate    Co-evaluation             End of Session Equipment Utilized During Treatment: Oxygen;Gait belt Activity Tolerance: Patient limited by fatigue Patient left: with call bell/phone within reach;in bed     Time: 3419-6222 PT Time Calculation (min) (ACUTE ONLY): 29 min  Charges:  $Gait Training: 23-37  mins                    G Codes:      Philomena Doheny 06/17/2015, 1:48 PM [phone]

## 2015-06-17 NOTE — Care Management Important Message (Signed)
Important Message  Patient Details  Name: Irma Roulhac MRN: [id number] Date of Birth: 05/08/1934   Medicare Important Message Given:  Yes    Camillo Flaming 06/17/2015, 10:52 AMImportant Message  Patient Details  Name: Demia Viera MRN: [id number] Date of Birth: 1934-05-22   Medicare Important Message Given:  Yes    Camillo Flaming 06/17/2015, 10:52 AM

## 2015-06-17 NOTE — Progress Notes (Signed)
   Name: Heather Barnes MRN: [id number] DOB: [birth year]    ADMISSION DATE:  06/11/2015 CONSULTATION DATE:  61/4  REFERRING MD :  Venora Maples  CHIEF COMPLAINT:  Acute on chronic respiratory failure and CAP  BRIEF PATIENT DESCRIPTION:  82 year old female pt of MR, w/ sig h/o GOLD D COPD and resultant chronic respiratory failure as well as T1 N0 non-small cell lung cancer of the right middle lobe now 3 yrs out from XRT and remains in remission. Admitted 1/4 w/ working dx of CAP/AECOPD.   SIGNIFICANT EVENTS    STUDIES:   SUBJECTIVE:  Feels  better  C/o loose stools x 2 Feels wobbly & weak  VITAL SIGNS: Temp:  [97.8 F (36.6 C)-98.3 F (36.8 C)] 97.9 F (36.6 C) (01/10 0541) Pulse Rate:  [80-84] 80 (01/10 0541) Resp:  [20-22] 20 (01/10 0541) BP: (131-135)/(54-65) 134/54 mmHg (01/10 0541) SpO2:  [95 %-99 %] 95 % (01/10 1158) Weight:  [111 lb 1.8 oz (50.4 kg)] 111 lb 1.8 oz (50.4 kg) (01/10 0448)     No intake or output data in the 24 hours ending 06/17/15 1302 PHYSICAL EXAMINATION: General:  elderlywhite female, on [place] Still w/ accessory muscle use. Able to speak in full sentences Neuro:  Awake, alert, no focal def  HEENT:  NCAT, faint JVD, MMM Cardiovascular:  Rrr, no MRG Lungs:  Diffuse wheeze, equal chest rise, Crackles left posterior base  Abdomen:  Soft, not tender, no OM.  Musculoskeletal:  Equal st and bulk Skin:  Warm, dry and intact   CBC No results for input(s): WBC, HGB, HCT, PLT in the last 72 hours.  Coag's No results for input(s): APTT, INR in the last 72 hours.  BMET No results for input(s): NA, K, CL, CO2, BUN, CREATININE, GLUCOSE in the last 72 hours.  Electrolytes No results for input(s): CALCIUM, MG, PHOS in the last 72 hours.  Sepsis Markers No results for input(s): PROCALCITON, O2SATVEN in the last 72 hours.  Invalid input(s): LACTICACIDVEN  ABG No results for input(s): PHART, PCO2ART, PO2ART in the last 72 hours.  Liver Enzymes No results  for input(s): AST, ALT, ALKPHOS, BILITOT, ALBUMIN in the last 72 hours.  Cardiac Enzymes No results for input(s): TROPONINI, PROBNP in the last 72 hours.  Glucose No results for input(s): GLUCAP in the last 72 hours.  Imaging No results found.  ASSESSMENT / PLAN: 1) Acute on Chronic Respiratory Failure in the Setting of CAP further c/b AECOPD (LLL)-->urine Strep Antigen is positive.   Plan tele  Wean O2 Continue Maximize BDs (scheduled brovana/budesonide & scheduled SABA to transition of cont neb) Flutter valve  Mobilize  IV solumedrol  (change to q24) Empiric azith 1/4>>>1/8 rocephin 1/4>>>  RVP neg   2) h/o CAD Plan Resume Lipitor  Cont tele   3) dilutional anemia Plan Trend CBC   Ct xanax for anxiety  U Strep antigen is positive.  Of note she was on pulsed O2 at home.  Will need HHPT/ HHOT Close to dc from resp standpoint -needs to improve strength & balance to be able to walk to the BR unassisted as endpoint - does not want to go to rehab  [facility] V. MD  [phone] 06/17/2015, 1:02 PM

## 2015-06-18 MED ORDER — PREDNISONE 20 MG PO TABS: ORAL_TABLET | ORAL | Status: DC

## 2015-06-18 NOTE — Discharge Instructions (Signed)
1.  Review your medications carefully as they have changed. 2.  Follow up with Pulmonary as arranged. If you can not make it, please call to reschedule 3.  Please bring BOTH continuous O2 tank and pulse O2 tank with you to the office so we can assess you on both 4.  Complete steroid taper as ordered

## 2015-06-18 NOTE — Progress Notes (Signed)
She can be discharged on 60 mg of prednisone -slow taper over 2-3 weeks Resume dulera & incruse She will need home health physical therapy and occupation therapy and a walker She has completed 7 days of ceftriaxone which should suffice  Rigoberto Noel. MD

## 2015-06-18 NOTE — Care Management Note (Signed)
Case Management Note  Patient Details  Name: Heather Barnes MRN: [id number] Date of Birth: [birth year]  Subjective/Objective:    Pt admitted for Respiratory failure                Action/Plan:  Pt plan to go home with [facility] for HHPT/OT   Expected Discharge Date:   (UNKNOWN)               Expected Discharge Plan:  [place]  In-House Referral:     Discharge planning Services  CM Consult  Post Acute Care Choice:    Choice offered to:  Patient  DME Arranged:    DME Agency:     HH Arranged:  PT, OT HH Agency:  [place]  Status of Service:  In process, will continue to follow  Medicare Important Message Given:  Yes Date Medicare IM Given:    Medicare IM give by:    Date Additional Medicare IM Given:    Additional Medicare Important Message give by:     If discussed at [place] of Stay Meetings, dates discussed:    Additional CommentsPurcell Mouton, RN 06/18/2015, 12:12 PM

## 2015-06-18 NOTE — Discharge Summary (Signed)
Physician Discharge Summary  Patient ID: Heather Barnes MRN: [id number] DOB/AGE: 80/28/1935 80 y.o.  Admit date: 06/11/2015 Discharge date: 06/18/2015    Discharge Diagnoses:  Acute on Chronic Respiratory Failure  Streptococcus Pneumoniae PNA  Acute Exacerbation of COPD  CAD Dilutional Anemia  Anxiety  Deconditioning                                                                      DISCHARGE PLAN BY DIAGNOSIS     Acute on Chronic Hypoxic Respiratory Failure  Streptococcus Pneumoniae PNA  Acute Exacerbation of COPD   Discharge Plan: -Prior baseline of 2L pulse O2, pt concerned she needs continuous.  Instructed to bring both tanks to the office to allow for assessment of ambulatory saturations on both.   -Prednisone taper by 80m Q3 days -Continue Incruse, Dulera with Aerochamber -Continue Singulair, cetirizine, flonase -PRN albuterol -Completed 7 days Rocephin while inpatient, no further antibiotics  CAD  Discharge Plan: -Continue Lipitor -Follow up with PCP / Cardiology as previously arranged  Dilutional Anemia   Discharge Plan: Resolving, no acute follow up warranted at this time  Anxiety  Deconditioning   Discharge Plan: Pt utilized xanax while inpatient.  None for discharge.  Suspect increased anxiety related to secondary effects of nebulized meds Home health PT, OT, RN for discharge Mobilize as able                   DISCHARGE SUMMARY   Heather Bonneauis a 82y.o. y/o female with a PMH of GOLD D COPD (followed by MR) and resultant chronic respiratory failure as well as T1 N0 non-small cell lung cancer of the right middle lobe now 3 yrs out from XRT and remains in remission [facility] with increased shortness of breath, nausea, & pleuritic chest pain.   She was recently seen in the ER on 1/2 for upper respiratory infection complaints.  The patient reported dyspnea, cough, sneezing. She was treated w prednisone with some slight  improvement in head congestion. She was woke up in the middle of the night with more SOB, chest pain (left/pleuritic in nature), nausea, & possibly a chill. She checked her Spo2 and was running in the 80's. She increased her O2 from 2 >> 4L/min and called PCCM on call MD who advised her to be evaluated.     Initial ER evaluation noted her to be in severe respiratory distress requiring bipap with minimal air movement.  She was hypoxic and started on BiPAP.  CXR demonstrated a LLL consolidation.  She was treated with IV steroids, magnesium and nebulized bronchodilators.  [facility]  The patient was treated with for CAP and an AECOPD.  Sputum cultures, RVP and flu screening obtained and negative.  Urine strep antigen was positive.  The patient completed 7 days of rocephin for streptococcal PNA.  The patient made slow improvement on antibiotics and treatment for AECOPD.  The patient expressed she felt prior to admission, pulse O2 was not enough and she was fatigued / dyspneic with use.  At time of discharge, she will transition to continuous O2 at 2L with re-evaluation in office with walk test to determine O2 need as  she improves.  She was evaluated by physical therapy and recommended home health PT & rolling walker.  On 1/11, the patient was medically cleared for discharge with plans as above.                SIGNIFICANT DIAGNOSTIC STUDIES 01/02  CXR >> no acute infiltrate, severe emphysema  01/04  CXR >> left basilar consolidation concerning for PNA 01/06  CXR >> persistent patchy infiltrates in lower lobes L>R   MICRO DATA  MRSA PCR 1/4 >> neg  UC 1/4 >> multiple species RVP 1/4 >> negative  BCx2 1/4 >> negative   ANTIBIOTICS Azithromycin 1/4 >> 1/9 Rocephin 1/4 >> 1/10    Discharge Exam: General: well developed elderly female in NAD, sitting up in bed Neuro: AAOx4, speech clear, MAE, HOH CV: s1s2 rrr, no m/r/g PULM: prolonged exp phase, rhonchi, wheezing but  improved  GI: NTND, bsx4 active  Extremities: warm/dry, no edema  Filed Vitals:   06/18/15 0207 06/18/15 0608 06/18/15 0832 06/18/15 1208  BP:  135/50    Pulse:  103    Temp:  97.9 F (36.6 C)    TempSrc:  Oral    Resp:  23    Height:      Weight:  113 lb 14.4 oz (51.665 kg)    SpO2: 94% 94% 96% 92%     Discharge Labs  BMET  Recent Labs Lab 06/12/15 0350 06/13/15 0346  NA 142 143  K 3.8 3.9  CL 107 105  CO2 27 27  GLUCOSE 135* 140*  BUN 10 14  CREATININE 0.43* 0.42*  CALCIUM 8.2* 8.5*  MG 2.4  --   PHOS 2.2*  --    CBC  Recent Labs Lab 06/12/15 0350 06/13/15 0346  HGB 11.1* 11.8*  HCT 34.6* 37.1  WBC 16.1* 17.6*  PLT 179 223    Discharge Instructions    Call MD for:  difficulty breathing, headache or visual disturbances    Complete by:  As directed      Call MD for:  extreme fatigue    Complete by:  As directed      Call MD for:  hives    Complete by:  As directed      Call MD for:  persistant dizziness or light-headedness    Complete by:  As directed      Call MD for:  persistant nausea and vomiting    Complete by:  As directed      Call MD for:  severe uncontrolled pain    Complete by:  As directed      Call MD for:  temperature >100.4    Complete by:  As directed      Diet general    Complete by:  As directed      Discharge instructions    Complete by:  As directed   1.  Review your medications carefully as they have changed.  2.  Home health physical therapy & occupational therapy will be coming to your home for strength / endurance exercises 3.  Follow up with Pulmonary as arranged     Increase activity slowly    Complete by:  As directed                 Follow-up Information    Follow up with Heather Spatz, NP On 06/26/2015.   Specialty:  Pulmonary Disease   Why:  Appt at 9:00 AM    Contact information:   [address] [place] [address] [phone]          Medication List    TAKE these medications         AEROCHAMBER MV inhaler  Use as instructed     alendronate 70 MG tablet  Commonly known as:  FOSAMAX  Take 70 mg by mouth every 7 (seven) days. Take with a full glass of water on an empty stomach. Saturday     atorvastatin 40 MG tablet  Commonly known as:  LIPITOR  Take 1 tablet (40 mg total) by mouth daily.     cetirizine 10 MG tablet  Commonly known as:  ZYRTEC  Take 10 mg by mouth daily.     fluticasone 50 MCG/ACT nasal spray  Commonly known as:  FLONASE  Place 2 sprays into the nose as needed for allergies or rhinitis.     INCRUSE ELLIPTA 62.5 MCG/INH Aepb  Generic drug:  Umeclidinium Bromide  INHALE ONE PUFF INTO THE LUNGS ONCE DAILY     mometasone-formoterol 100-5 MCG/ACT Aero  Commonly known as:  DULERA  Inhale 2 puffs into the lungs 2 (two) times daily.     multivitamin with minerals tablet  Take 1 tablet by mouth daily.     predniSONE 20 MG tablet  Commonly known as:  DELTASONE  3 tabs daily for 3 days, then 2 1/2 tabs daily for 3 days, then 2 tabs daily for 3 days, then 1 1/2 tab daily for 3 days, then 1 tab daily for 3 days, then 1/2 tab daily for 3 days and stop     PROAIR HFA 108 (90 Base) MCG/ACT inhaler  Generic drug:  albuterol  Inhale 2 puffs into the lungs every 6 (six) hours as needed for wheezing or shortness of breath.     SINGULAIR 10 MG tablet  Generic drug:  montelukast  Take 10 mg by mouth Daily.     Vitamin D 2000 units Caps  Take 1 capsule by mouth 2 (two) times daily.          Disposition:  Home.  Home health PT / OT arranged at discharge.  Home continuous O2 at 2L continuous  Discharged Condition: Heather Barnes has met maximum benefit of inpatient care and is medically stable and cleared for discharge.  Patient is pending follow up as above.      Time spent on disposition:  Greater than 35 minutes.   Signed: Noe Gens, NP-C [facility] Pgr: [phone] Office: [phone]

## 2015-06-23 ENCOUNTER — Telehealth: Payer: Self-pay | Admitting: Internal Medicine

## 2015-06-23 DIAGNOSIS — J441 Chronic obstructive pulmonary disease with (acute) exacerbation: Secondary | ICD-10-CM | POA: Diagnosis not present

## 2015-06-23 DIAGNOSIS — J440 Chronic obstructive pulmonary disease with acute lower respiratory infection: Secondary | ICD-10-CM | POA: Diagnosis not present

## 2015-06-23 DIAGNOSIS — I2510 Atherosclerotic heart disease of native coronary artery without angina pectoris: Secondary | ICD-10-CM | POA: Diagnosis not present

## 2015-06-23 DIAGNOSIS — M810 Age-related osteoporosis without current pathological fracture: Secondary | ICD-10-CM | POA: Diagnosis not present

## 2015-06-23 DIAGNOSIS — F419 Anxiety disorder, unspecified: Secondary | ICD-10-CM | POA: Diagnosis not present

## 2015-06-23 DIAGNOSIS — Z85118 Personal history of other malignant neoplasm of bronchus and lung: Secondary | ICD-10-CM | POA: Diagnosis not present

## 2015-06-23 DIAGNOSIS — D649 Anemia, unspecified: Secondary | ICD-10-CM | POA: Diagnosis not present

## 2015-06-23 DIAGNOSIS — J45909 Unspecified asthma, uncomplicated: Secondary | ICD-10-CM | POA: Diagnosis not present

## 2015-06-23 DIAGNOSIS — J13 Pneumonia due to Streptococcus pneumoniae: Secondary | ICD-10-CM | POA: Diagnosis not present

## 2015-06-23 DIAGNOSIS — Z87891 Personal history of nicotine dependence: Secondary | ICD-10-CM | POA: Diagnosis not present

## 2015-06-23 MED ORDER — NYSTATIN 100000 UNIT/ML MT SUSP: 5.0000 mL | Freq: Four times a day (QID) | OROMUCOSAL | Status: DC

## 2015-06-23 MED ORDER — AZITHROMYCIN 250 MG PO TABS: ORAL_TABLET | ORAL | Status: DC

## 2015-06-23 NOTE — Telephone Encounter (Signed)
Spoke with pt's home care nurse - Katharine Look. States that pt is complaining of a possible sinus infection. Reports sinus pressure/pain, runny nose, sneezing. She is producing green mucus from her sinuses. Denies coughing, chest tightness, wheezing, SOB or fever. Katharine Look did notice that the pt also has thrush >> has big white spots on her tongue. Would like MR's recommendations.  MR - please advise. Thanks.

## 2015-06-23 NOTE — Telephone Encounter (Signed)
lmtcb X1 for [employer] with pt, aware of recs.  rx sent to preferred pharmacy.  Nothing further needed.

## 2015-06-23 NOTE — Telephone Encounter (Signed)
Send Zpak.  Send nystatin swish/swallow, 5 ml qid for 5 days.

## 2015-06-23 NOTE — Telephone Encounter (Signed)
Will send to DOD since we have not gotten a response form MR.  VS - please advise. Thanks.

## 2015-06-24 DIAGNOSIS — I2510 Atherosclerotic heart disease of native coronary artery without angina pectoris: Secondary | ICD-10-CM | POA: Diagnosis not present

## 2015-06-24 DIAGNOSIS — J441 Chronic obstructive pulmonary disease with (acute) exacerbation: Secondary | ICD-10-CM | POA: Diagnosis not present

## 2015-06-24 DIAGNOSIS — J45909 Unspecified asthma, uncomplicated: Secondary | ICD-10-CM | POA: Diagnosis not present

## 2015-06-24 DIAGNOSIS — J13 Pneumonia due to Streptococcus pneumoniae: Secondary | ICD-10-CM | POA: Diagnosis not present

## 2015-06-24 DIAGNOSIS — D649 Anemia, unspecified: Secondary | ICD-10-CM | POA: Diagnosis not present

## 2015-06-24 DIAGNOSIS — J440 Chronic obstructive pulmonary disease with acute lower respiratory infection: Secondary | ICD-10-CM | POA: Diagnosis not present

## 2015-06-25 DIAGNOSIS — J441 Chronic obstructive pulmonary disease with (acute) exacerbation: Secondary | ICD-10-CM | POA: Diagnosis not present

## 2015-06-25 DIAGNOSIS — J440 Chronic obstructive pulmonary disease with acute lower respiratory infection: Secondary | ICD-10-CM | POA: Diagnosis not present

## 2015-06-25 DIAGNOSIS — I2510 Atherosclerotic heart disease of native coronary artery without angina pectoris: Secondary | ICD-10-CM | POA: Diagnosis not present

## 2015-06-25 DIAGNOSIS — J13 Pneumonia due to Streptococcus pneumoniae: Secondary | ICD-10-CM | POA: Diagnosis not present

## 2015-06-25 DIAGNOSIS — D649 Anemia, unspecified: Secondary | ICD-10-CM | POA: Diagnosis not present

## 2015-06-25 DIAGNOSIS — J45909 Unspecified asthma, uncomplicated: Secondary | ICD-10-CM | POA: Diagnosis not present

## 2015-06-26 ENCOUNTER — Encounter: Payer: Self-pay | Admitting: Acute Care

## 2015-06-26 ENCOUNTER — Ambulatory Visit (INDEPENDENT_AMBULATORY_CARE_PROVIDER_SITE_OTHER)
Admission: RE | Admit: 2015-06-26 | Discharge: 2015-06-26 | Disposition: A | Discharge status: Home / Self Care | Payer: Medicare Other | Source: Ambulatory Visit | Attending: Acute Care | Admitting: Acute Care

## 2015-06-26 ENCOUNTER — Ambulatory Visit (INDEPENDENT_AMBULATORY_CARE_PROVIDER_SITE_OTHER): Payer: Medicare Other | Admitting: Acute Care

## 2015-06-26 VITALS — BP 136/70 | HR 98 | Temp 98.4°F | Ht <= 58 in | Wt 114.2 lb

## 2015-06-26 DIAGNOSIS — J449 Chronic obstructive pulmonary disease, unspecified: Secondary | ICD-10-CM | POA: Diagnosis not present

## 2015-06-26 DIAGNOSIS — J180 Bronchopneumonia, unspecified organism: Secondary | ICD-10-CM

## 2015-06-26 DIAGNOSIS — J9611 Chronic respiratory failure with hypoxia: Secondary | ICD-10-CM

## 2015-06-26 DIAGNOSIS — J189 Pneumonia, unspecified organism: Secondary | ICD-10-CM | POA: Diagnosis not present

## 2015-06-26 DIAGNOSIS — B370 Candidal stomatitis: Secondary | ICD-10-CM | POA: Diagnosis not present

## 2015-06-26 MED ORDER — ALBUTEROL SULFATE 108 (90 BASE) MCG/ACT IN AEPB: 2.0000 | INHALATION_SPRAY | Freq: Four times a day (QID) | RESPIRATORY_TRACT | Status: DC | PRN

## 2015-06-26 NOTE — Patient Instructions (Addendum)
You look great today. Continue your Dulera and Incruse Continue Singulair, Flonase and Zyrtec Pro-Air as needed. Use continuous oxygen, you desaturate on the pulse oxygen. Wean off oxygen during day as you feel able to return to your night time regimen Use AYR saline gel for the dryness and sores in your nose. We will add humidity to your oxygen to help with the dryness in your nose. Your Chest xray shows resolution of your pneumonia. Continue the Z-Pack until finished. Continue prednisone taper until finished. Keep eating yogurt and using the magic mouthwash. Follow up with Dr. Chase Caller first available appointment. We will re-evaluate if you want to use the albuterol nebs instead of the Pro Air inhaler inhaler  When you see Dr. Chase Caller..  We will let you try the Respi-click inhaler to see if you like that more.  We will give you a sample and teach you how to use it. Please contact office for sooner follow up if symptoms do not improve or worsen or seek emergency care.

## 2015-06-26 NOTE — Progress Notes (Signed)
Subjective:    Patient ID: Heather Barnes, female    DOB: [birth year], 82 y.o.   MRN: [id number]  HPI   Heather Barnes is a 82 y.o. y/o female with a PMH of GOLD D COPD (followed by MR) and resultant chronic respiratory failure as well as T1 N0 non-small cell lung cancer of the right middle lobe now 3 yrs out from XRT and remains in remission.  Significant Events: Admission for Streptococcal Pneumonia.  Admit date: 06/11/2015 Discharge date: 06/18/2015    Discharge Diagnoses:  Acute on Chronic Respiratory Failure  Streptococcus Pneumoniae PNA  Acute Exacerbation of COPD  CAD Dilutional Anemia  Anxiety  Deconditioning   Treatment included: IV steroids, Magnesium, Nebulized BD,Rocephin x 7 days,Azithromycin 06/11/15-06/16/15. Transitioned to prednisone taper, and home maintenance Dulera, Incruse, Singulair, Zyrtec and Flonase for discharge.  CXR 06/13/15:  IMPRESSION: Persistent patchy infiltrates in the lower lobes left worse than right consistent with bronchopneumonia. No worsening or new finding.  CXR 06/26/2015  Interval clearing of bibasilar opacities consistent with resolved pneumonia. Chronic bibasilar scarring and COPD.   06/25/2105: Hospital follow up: Heather Barnes states that she is doing well since discharge. She denies fever ,chills, sweats or hemoptysis. Wheezing is improving, some chest tightness at times.She continues with the prednisone taper, and remains on a z-pack that was called in by Dr. Halford Chessman on 06/23/2015 for sinusitis. She is very compliant with her maintenance medications for both her COPD and Allergies. She is using continuous oxygen. We walked her in the office on both continuous and pulse oxygen. Her saturations dropped to 90% on the pulse oxygen, and 93% on the continuous oxygen. She will remain on continuous oxygen at her own request as she feels it is what she needs.We will repeat her CXR today to ensure resolution of pneumonia.  . Past  Medical History  Diagnosis Date  . Asthma   . Hx of colonic polyps   . Osteoporosis   . Hyperlipidemia   . Goiter   . Shortness of breath   . COPD (chronic obstructive pulmonary disease) ([place])   . Lung cancer, middle lobe ([place]) 04/26/2011  . Cataract     bilat.cataract/lens implant  . Allergy     seasonal pollen  . Colon adenomas   . History of radiation therapy 05/24/11-06/02/11    R middle lobe lung/ 62 gray    Current outpatient prescriptions:  .  albuterol (PROAIR HFA) 108 (90 BASE) MCG/ACT inhaler, Inhale 2 puffs into the lungs every 6 (six) hours as needed for wheezing or shortness of breath. , Disp: , Rfl:  .  alendronate (FOSAMAX) 70 MG tablet, Take 70 mg by mouth every 7 (seven) days. Take with a full glass of water on an empty stomach. Saturday, Disp: , Rfl:  .  atorvastatin (LIPITOR) 40 MG tablet, Take 1 tablet (40 mg total) by mouth daily., Disp: 90 tablet, Rfl: 3 .  azithromycin (ZITHROMAX Z-PAK) 250 MG tablet, Take 2 tabs today, then 1 daily until gone., Disp: 6 each, Rfl: 0 .  cetirizine (ZYRTEC) 10 MG tablet, Take 10 mg by mouth daily.  , Disp: , Rfl:  .  Cholecalciferol (VITAMIN D) 2000 UNITS CAPS, Take 1 capsule by mouth 2 (two) times daily. , Disp: , Rfl:  .  fluticasone (FLONASE) 50 MCG/ACT nasal spray, Place 2 sprays into the nose as needed for allergies or rhinitis. , Disp: , Rfl:  .  INCRUSE ELLIPTA 62.5 MCG/INH AEPB, INHALE ONE PUFF INTO THE LUNGS  ONCE DAILY, Disp: 30 each, Rfl: 3 .  mometasone-formoterol (DULERA) 100-5 MCG/ACT AERO, Inhale 2 puffs into the lungs 2 (two) times daily., Disp: 13 g, Rfl: 5 .  Multiple Vitamins-Minerals (MULTIVITAMIN WITH MINERALS) tablet, Take 1 tablet by mouth daily.  , Disp: , Rfl:  .  nystatin (MYCOSTATIN) 100000 UNIT/ML suspension, Take 5 mLs (500,000 Units total) by mouth 4 (four) times daily., Disp: 100 mL, Rfl: 0 .  predniSONE (DELTASONE) 20 MG tablet, 3 tabs daily for 3 days, then 2 1/2 tabs daily for 3 days, then 2 tabs  daily for 3 days, then 1 1/2 tab daily for 3 days, then 1 tab daily for 3 days, then 1/2 tab daily for 3 days and stop, Disp: 32 tablet, Rfl: 0 .  SINGULAIR 10 MG tablet, Take 10 mg by mouth Daily. , Disp: , Rfl:  .  Spacer/Aero-Holding Chambers (AEROCHAMBER MV) inhaler, Use as instructed, Disp: 1 each, Rfl: 0 .  Albuterol Sulfate (PROAIR RESPICLICK) 914 (90 Base) MCG/ACT AEPB, Inhale 2 puffs into the lungs every 6 (six) hours as needed., Disp: 1 each, Rfl: 0   Allergies  Allergen Reactions  . Tiotropium Bromide Monohydrate Hives, Shortness Of Breath and Rash  . Codeine Other (See Comments)    Hyperactivity,crying  . Asa [Aspirin] Other (See Comments)    Bleeding -ulcers     Review of Systems Constitutional:   No  weight loss, night sweats,  Fevers, chills, fatigue, or  lassitude.  HEENT:   No headaches,  Difficulty swallowing,  Tooth/dental problems, or  Sore throat,                No sneezing, itching, ear ache, nasal congestion, post nasal drip,   CV:  No chest pain,  Orthopnea, PND, swelling in lower extremities, anasarca, dizziness, palpitations, syncope.   GI  No heartburn, indigestion, abdominal pain, nausea, vomiting, diarrhea, change in bowel habits, loss of appetite, bloody stools.   Resp: Baseline shortness of breath with exertion not at rest.  No excess mucus, no productive cough,  No non-productive cough,  No coughing up of blood.  No change in color of mucus.  No wheezing.  No chest wall deformity  Skin: no rash or lesions.  GU: no dysuria, change in color of urine, no urgency or frequency.  No flank pain, no hematuria   MS:  No joint pain or swelling.  No decreased range of motion.  No back pain.  Psych:  No change in mood or affect. No depression or anxiety.  No memory loss.        Objective:   Physical Exam BP 136/70 mmHg  Pulse 98  Temp(Src) 98.4 F (36.9 C) (Oral)  Ht '4\' 10"'$  (1.473 m)  Wt 114 lb 3.2 oz (51.801 kg)  BMI 23.87 kg/m2   SpO2 97%   Physical Exam:  General- No distress,  A&Ox3, elderly female on oxygen, using walker ENT: No sinus tenderness, TM clear, pale nasal mucosa, no oral exudate,no post nasal drip, no LAN, improving sinusitis with Z-Pack. Cardiac: S1, S2, regular rate and rhythm, no murmur Chest: Some wheezes, but improving/ rales/ dullness; no accessory muscle use, no nasal flaring, no sternal retractions, breath sounds diminished per bases. Abd.: Soft Non-tender Ext: 1+ edema lower extremities Neuro:  normal strength Skin: No rashes, warm and dry Psych: normal mood and behavior        Assessment & Plan:

## 2015-06-26 NOTE — Assessment & Plan Note (Signed)
Feeling better after hospitalization 06/11/15-06/18/15 for acute on chronic hypoxic respiratory failure, + strept pneumonia,acute exacerbation of COPD. Feels she needs continuous oxygen rather than the pulse. We did a walk in the office on both the pulse oxygen and the continuous oxygen. Her saturation dropped to 90% on pulse and 93% on continuous.Chest xray today shows resolution of pneumonia.  Plan: Continue your Dulera and Incruse Continue Singulair, Flonase and Zyrtec Pro-Air as needed. Use continuous oxygen, you desaturate on the pulse oxygen. Wean off oxygen during day as you feel able to return to your night time regimen Use AYR saline gel for the dryness and sores in your nose. We will add humidity to your oxygen to help with the dryness in your nose. Your Chest xray shows resolution of your pneumonia. Continue the Z-Pack until finished. Continue prednisone taper until finished. Keep eating yogurt and using the magic mouthwash. Follow up with Dr. Chase Caller first available appointment.

## 2015-06-26 NOTE — Assessment & Plan Note (Signed)
Oral thrush while on inhaled corticosteroids and antibiotics for pneumonia/ sinus infection. Plan: Continue magic mouthwash 5 cc QID for 5 days.

## 2015-06-27 DIAGNOSIS — J441 Chronic obstructive pulmonary disease with (acute) exacerbation: Secondary | ICD-10-CM | POA: Diagnosis not present

## 2015-06-27 DIAGNOSIS — J45909 Unspecified asthma, uncomplicated: Secondary | ICD-10-CM | POA: Diagnosis not present

## 2015-06-27 DIAGNOSIS — D649 Anemia, unspecified: Secondary | ICD-10-CM | POA: Diagnosis not present

## 2015-06-27 DIAGNOSIS — J440 Chronic obstructive pulmonary disease with acute lower respiratory infection: Secondary | ICD-10-CM | POA: Diagnosis not present

## 2015-06-27 DIAGNOSIS — J13 Pneumonia due to Streptococcus pneumoniae: Secondary | ICD-10-CM | POA: Diagnosis not present

## 2015-06-27 DIAGNOSIS — I2510 Atherosclerotic heart disease of native coronary artery without angina pectoris: Secondary | ICD-10-CM | POA: Diagnosis not present

## 2015-06-30 DIAGNOSIS — J13 Pneumonia due to Streptococcus pneumoniae: Secondary | ICD-10-CM | POA: Diagnosis not present

## 2015-06-30 DIAGNOSIS — I2510 Atherosclerotic heart disease of native coronary artery without angina pectoris: Secondary | ICD-10-CM | POA: Diagnosis not present

## 2015-06-30 DIAGNOSIS — D649 Anemia, unspecified: Secondary | ICD-10-CM | POA: Diagnosis not present

## 2015-06-30 DIAGNOSIS — J440 Chronic obstructive pulmonary disease with acute lower respiratory infection: Secondary | ICD-10-CM | POA: Diagnosis not present

## 2015-06-30 DIAGNOSIS — J45909 Unspecified asthma, uncomplicated: Secondary | ICD-10-CM | POA: Diagnosis not present

## 2015-06-30 DIAGNOSIS — J441 Chronic obstructive pulmonary disease with (acute) exacerbation: Secondary | ICD-10-CM | POA: Diagnosis not present

## 2015-07-01 ENCOUNTER — Telehealth: Payer: Self-pay | Admitting: Internal Medicine

## 2015-07-01 DIAGNOSIS — J13 Pneumonia due to Streptococcus pneumoniae: Secondary | ICD-10-CM | POA: Diagnosis not present

## 2015-07-01 DIAGNOSIS — J45909 Unspecified asthma, uncomplicated: Secondary | ICD-10-CM | POA: Diagnosis not present

## 2015-07-01 DIAGNOSIS — J440 Chronic obstructive pulmonary disease with acute lower respiratory infection: Secondary | ICD-10-CM | POA: Diagnosis not present

## 2015-07-01 DIAGNOSIS — D649 Anemia, unspecified: Secondary | ICD-10-CM | POA: Diagnosis not present

## 2015-07-01 DIAGNOSIS — J441 Chronic obstructive pulmonary disease with (acute) exacerbation: Secondary | ICD-10-CM | POA: Diagnosis not present

## 2015-07-01 DIAGNOSIS — I2510 Atherosclerotic heart disease of native coronary artery without angina pectoris: Secondary | ICD-10-CM | POA: Diagnosis not present

## 2015-07-01 NOTE — Telephone Encounter (Signed)
Pt states she's finished her zpak and is on her last dose of pred taper given last week.  Pt c/o sinus congestion, PND, scratchy throat.  Denies fever, chest congestion, chest pain.  Pt requesting further recs.   Sending to doc of the day as MR is unavailable.  TP please advise.  Thanks!   Pt uses [facility] on [address]

## 2015-07-01 NOTE — Telephone Encounter (Signed)
Pt has had a full course of abx and PNA cleared on cxr  Would try mucinex dm Twice daily  As needed  Cough/congestion to see if this helps.  Make sure taking inhalers.  Seen last week CXR showed cleared PNA>  If not improving , call back may need ov  Please contact office for sooner follow up if symptoms do not improve or worsen or seek emergency care

## 2015-07-01 NOTE — Telephone Encounter (Signed)
Spoke with pt. Made her aware we are still waiting on TP's response. She verbalized understanding.  TP- please advise. Thanks.

## 2015-07-01 NOTE — Telephone Encounter (Signed)
[facility] calling to check on status of request.Heather Barnes

## 2015-07-01 NOTE — Telephone Encounter (Signed)
Called and spoke with pt. Reviewed TP's recs. Pt voiced understanding and had no further questions. Nothing further needed at this time.

## 2015-07-02 ENCOUNTER — Telehealth: Payer: Self-pay | Admitting: Internal Medicine

## 2015-07-02 NOTE — Telephone Encounter (Signed)
Spoke with pt. States that her Ruthe Mannan is going to cost her $140 a month. She can't afford this. In the past MR has done a Tier Exception for this. Samples will be left at the front desk for her as she only has 3 days left of medication.  MR- please advise if you are willing to do a Tier Exception. Thanks.

## 2015-07-02 NOTE — Telephone Encounter (Signed)
Tier eception is fine

## 2015-07-03 NOTE — Telephone Encounter (Signed)
Attempted to contact pt. No answer. Will try back. 

## 2015-07-04 DIAGNOSIS — J45909 Unspecified asthma, uncomplicated: Secondary | ICD-10-CM | POA: Diagnosis not present

## 2015-07-04 DIAGNOSIS — I2510 Atherosclerotic heart disease of native coronary artery without angina pectoris: Secondary | ICD-10-CM | POA: Diagnosis not present

## 2015-07-04 DIAGNOSIS — D649 Anemia, unspecified: Secondary | ICD-10-CM | POA: Diagnosis not present

## 2015-07-04 DIAGNOSIS — J440 Chronic obstructive pulmonary disease with acute lower respiratory infection: Secondary | ICD-10-CM | POA: Diagnosis not present

## 2015-07-04 DIAGNOSIS — J441 Chronic obstructive pulmonary disease with (acute) exacerbation: Secondary | ICD-10-CM | POA: Diagnosis not present

## 2015-07-04 DIAGNOSIS — J13 Pneumonia due to Streptococcus pneumoniae: Secondary | ICD-10-CM | POA: Diagnosis not present

## 2015-07-04 NOTE — Telephone Encounter (Signed)
Pt states that she has already picked up the samples.  Aware that we are working on a [place] for this medication.

## 2015-07-08 DIAGNOSIS — J441 Chronic obstructive pulmonary disease with (acute) exacerbation: Secondary | ICD-10-CM | POA: Diagnosis not present

## 2015-07-08 DIAGNOSIS — J45909 Unspecified asthma, uncomplicated: Secondary | ICD-10-CM | POA: Diagnosis not present

## 2015-07-08 DIAGNOSIS — J13 Pneumonia due to Streptococcus pneumoniae: Secondary | ICD-10-CM | POA: Diagnosis not present

## 2015-07-08 DIAGNOSIS — J440 Chronic obstructive pulmonary disease with acute lower respiratory infection: Secondary | ICD-10-CM | POA: Diagnosis not present

## 2015-07-08 DIAGNOSIS — I2510 Atherosclerotic heart disease of native coronary artery without angina pectoris: Secondary | ICD-10-CM | POA: Diagnosis not present

## 2015-07-08 DIAGNOSIS — D649 Anemia, unspecified: Secondary | ICD-10-CM | POA: Diagnosis not present

## 2015-07-09 ENCOUNTER — Other Ambulatory Visit: Payer: Self-pay | Admitting: Internal Medicine

## 2015-07-11 DIAGNOSIS — D649 Anemia, unspecified: Secondary | ICD-10-CM | POA: Diagnosis not present

## 2015-07-11 DIAGNOSIS — J45909 Unspecified asthma, uncomplicated: Secondary | ICD-10-CM | POA: Diagnosis not present

## 2015-07-11 DIAGNOSIS — I2510 Atherosclerotic heart disease of native coronary artery without angina pectoris: Secondary | ICD-10-CM | POA: Diagnosis not present

## 2015-07-11 DIAGNOSIS — J441 Chronic obstructive pulmonary disease with (acute) exacerbation: Secondary | ICD-10-CM | POA: Diagnosis not present

## 2015-07-11 DIAGNOSIS — J13 Pneumonia due to Streptococcus pneumoniae: Secondary | ICD-10-CM | POA: Diagnosis not present

## 2015-07-11 DIAGNOSIS — J440 Chronic obstructive pulmonary disease with acute lower respiratory infection: Secondary | ICD-10-CM | POA: Diagnosis not present

## 2015-07-15 DIAGNOSIS — J440 Chronic obstructive pulmonary disease with acute lower respiratory infection: Secondary | ICD-10-CM | POA: Diagnosis not present

## 2015-07-15 DIAGNOSIS — J441 Chronic obstructive pulmonary disease with (acute) exacerbation: Secondary | ICD-10-CM | POA: Diagnosis not present

## 2015-07-15 DIAGNOSIS — J13 Pneumonia due to Streptococcus pneumoniae: Secondary | ICD-10-CM | POA: Diagnosis not present

## 2015-07-15 DIAGNOSIS — D649 Anemia, unspecified: Secondary | ICD-10-CM | POA: Diagnosis not present

## 2015-07-15 DIAGNOSIS — I2510 Atherosclerotic heart disease of native coronary artery without angina pectoris: Secondary | ICD-10-CM | POA: Diagnosis not present

## 2015-07-15 DIAGNOSIS — J45909 Unspecified asthma, uncomplicated: Secondary | ICD-10-CM | POA: Diagnosis not present

## 2015-07-18 DIAGNOSIS — J441 Chronic obstructive pulmonary disease with (acute) exacerbation: Secondary | ICD-10-CM | POA: Diagnosis not present

## 2015-07-18 DIAGNOSIS — J45909 Unspecified asthma, uncomplicated: Secondary | ICD-10-CM | POA: Diagnosis not present

## 2015-07-18 DIAGNOSIS — I2510 Atherosclerotic heart disease of native coronary artery without angina pectoris: Secondary | ICD-10-CM | POA: Diagnosis not present

## 2015-07-18 DIAGNOSIS — D649 Anemia, unspecified: Secondary | ICD-10-CM | POA: Diagnosis not present

## 2015-07-18 DIAGNOSIS — J440 Chronic obstructive pulmonary disease with acute lower respiratory infection: Secondary | ICD-10-CM | POA: Diagnosis not present

## 2015-07-18 DIAGNOSIS — J13 Pneumonia due to Streptococcus pneumoniae: Secondary | ICD-10-CM | POA: Diagnosis not present

## 2015-07-23 DIAGNOSIS — D649 Anemia, unspecified: Secondary | ICD-10-CM | POA: Diagnosis not present

## 2015-07-23 DIAGNOSIS — J441 Chronic obstructive pulmonary disease with (acute) exacerbation: Secondary | ICD-10-CM | POA: Diagnosis not present

## 2015-07-23 DIAGNOSIS — I2510 Atherosclerotic heart disease of native coronary artery without angina pectoris: Secondary | ICD-10-CM | POA: Diagnosis not present

## 2015-07-23 DIAGNOSIS — J13 Pneumonia due to Streptococcus pneumoniae: Secondary | ICD-10-CM | POA: Diagnosis not present

## 2015-07-23 DIAGNOSIS — J45909 Unspecified asthma, uncomplicated: Secondary | ICD-10-CM | POA: Diagnosis not present

## 2015-07-23 DIAGNOSIS — J440 Chronic obstructive pulmonary disease with acute lower respiratory infection: Secondary | ICD-10-CM | POA: Diagnosis not present

## 2015-07-28 DIAGNOSIS — J13 Pneumonia due to Streptococcus pneumoniae: Secondary | ICD-10-CM | POA: Diagnosis not present

## 2015-07-28 DIAGNOSIS — I2510 Atherosclerotic heart disease of native coronary artery without angina pectoris: Secondary | ICD-10-CM | POA: Diagnosis not present

## 2015-07-28 DIAGNOSIS — J45909 Unspecified asthma, uncomplicated: Secondary | ICD-10-CM | POA: Diagnosis not present

## 2015-07-28 DIAGNOSIS — J440 Chronic obstructive pulmonary disease with acute lower respiratory infection: Secondary | ICD-10-CM | POA: Diagnosis not present

## 2015-07-28 DIAGNOSIS — J441 Chronic obstructive pulmonary disease with (acute) exacerbation: Secondary | ICD-10-CM | POA: Diagnosis not present

## 2015-07-28 DIAGNOSIS — D649 Anemia, unspecified: Secondary | ICD-10-CM | POA: Diagnosis not present

## 2015-07-30 ENCOUNTER — Telehealth: Payer: Self-pay | Admitting: Internal Medicine

## 2015-07-30 NOTE — Telephone Encounter (Signed)
ATC, pt NA, line rang numerous times, No VM. WCB

## 2015-07-31 NOTE — Telephone Encounter (Signed)
Spoke with pt. She is calling about the Tier Exception we were supposed to do for [facility]. There was another message open on this but it was closed.  Daneil Dan - do you remember anything about this?

## 2015-08-01 NOTE — Telephone Encounter (Signed)
No, I do not recall a tier exception being completed. Called and spoke to pt, pt states she has not heard anything regarding the tier exception. [place] and the cost of the [facility] is over $140, which is why pt is needing a tier exception. Called [employer] at [phone] option 1 then option 4, line went to an automated system stating the office is closed for lunch and will re-open at [place].

## 2015-08-04 NOTE — Telephone Encounter (Signed)
Called [employer] number listed below at [phone]) requested tier exception form.  Spoke with Melina Copa. She said that we would have to contact pharmacy to get exception form Called [facility] and they gave me the number for [employer] and said that we need to contact them. Called [employer] help desk to get assistance with Tier Exception form [phone]) Spoke with Chasity at [employer] and she said that I would have to speak with [place] of [place] [phone]) [place] - spoke to [place] - requested tier exception for [facility]. Requested that we file Tier Exception through [facility]. Went into [facility] to initiate Tier Exception, however, would not accept patient's insurance information. Requested through [facility] faxed form. Awaiting fax.

## 2015-08-06 DIAGNOSIS — J441 Chronic obstructive pulmonary disease with (acute) exacerbation: Secondary | ICD-10-CM | POA: Diagnosis not present

## 2015-08-06 DIAGNOSIS — I2510 Atherosclerotic heart disease of native coronary artery without angina pectoris: Secondary | ICD-10-CM | POA: Diagnosis not present

## 2015-08-06 DIAGNOSIS — J13 Pneumonia due to Streptococcus pneumoniae: Secondary | ICD-10-CM | POA: Diagnosis not present

## 2015-08-06 DIAGNOSIS — D649 Anemia, unspecified: Secondary | ICD-10-CM | POA: Diagnosis not present

## 2015-08-06 DIAGNOSIS — J45909 Unspecified asthma, uncomplicated: Secondary | ICD-10-CM | POA: Diagnosis not present

## 2015-08-06 DIAGNOSIS — J440 Chronic obstructive pulmonary disease with acute lower respiratory infection: Secondary | ICD-10-CM | POA: Diagnosis not present

## 2015-08-06 NOTE — Telephone Encounter (Signed)
Pt calling to check on the status of rx request.Heather Barnes

## 2015-08-06 NOTE — Telephone Encounter (Signed)
Spoke with pt. Advised her that I do not see where a determination has been made.  Daneil Dan - have we received a fax on this? Thanks.

## 2015-08-07 NOTE — Telephone Encounter (Signed)
Pt calling to check on status of dulera request. Says she will be out tomorrow please advise.Heather Barnes

## 2015-08-07 NOTE — Telephone Encounter (Signed)
Spoke with pt. Advised her that I contacted her insurance about this tier exception. This has not been initiated yet. Request was started through [facility]. Key: [id number]. Your information has been submitted to [facility] [place].  [place] will review the request and notify you of the determination decision directly,  typically within 3 business days of your submission and once all necessary information is received.  Samples have been left at the front desk for the pt to pick up since she is almost out. Will await tier exception decision.

## 2015-08-12 ENCOUNTER — Encounter: Payer: Self-pay | Admitting: Internal Medicine

## 2015-08-12 ENCOUNTER — Ambulatory Visit (INDEPENDENT_AMBULATORY_CARE_PROVIDER_SITE_OTHER): Payer: Medicare Other | Admitting: Internal Medicine

## 2015-08-12 VITALS — BP 130/58 | HR 87 | Ht <= 58 in | Wt 119.0 lb

## 2015-08-12 DIAGNOSIS — J9611 Chronic respiratory failure with hypoxia: Secondary | ICD-10-CM

## 2015-08-12 DIAGNOSIS — J449 Chronic obstructive pulmonary disease, unspecified: Secondary | ICD-10-CM

## 2015-08-12 MED ORDER — FLUTICASONE FUROATE-VILANTEROL 100-25 MCG/INH IN AEPB: 1.0000 | INHALATION_SPRAY | Freq: Every day | RESPIRATORY_TRACT | Status: DC

## 2015-08-12 NOTE — Progress Notes (Signed)
Subjective:     Patient ID: Heather Barnes, female   DOB: [birth year], 82 y.o.   MRN: [id number]  HPI  OV 08/12/2015   Chief Complaint  Patient presents with  . Follow-up    Pt last seen by SG for HFU. pt states her breathing has improved since last OV. Pt states she has a mild dry cough. Pt states her SOB is still not back to baseline. pt wearing O2 more frequently. Pt denies CP/tightness.    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 She spent 8 days in the hospital. Then discharged. She presents for follow-up. Since then her physical conditioning has improved. Nevertheless she still away from baseline he did she has more shortness of breath and baseline. She has more exertional hypoxemia than baseline. She reports that preadmission she is to be able to walk 10 or 20 feet on room air without desaturation but now she desaturates into the 80s. She feels she needs her oxygen 24 7. Due to largest degrees and she does not want to attend pulmonary rehabilitation again. She also feels more fatigued but overall things are improving.  Daughter is in with her for the first time.  Current outpatient prescriptions:  .  albuterol (PROAIR HFA) 108 (90 BASE) MCG/ACT inhaler, Inhale 2 puffs into the lungs every 6 (six) hours as needed for wheezing or shortness of breath. , Disp: , Rfl:  .  alendronate (FOSAMAX) 70 MG tablet, Take 70 mg by mouth every 7 (seven) days. Take with a full glass of water on an empty stomach. Saturday, Disp: , Rfl:  .  atorvastatin (LIPITOR) 40 MG tablet, Take 1 tablet (40 mg total) by mouth daily., Disp: 90 tablet, Rfl: 3 .  cetirizine (ZYRTEC) 10 MG tablet, Take 10 mg by mouth daily.  , Disp: , Rfl:  .  Cholecalciferol (VITAMIN D) 2000 UNITS CAPS, Take 1 capsule by mouth 2 (two) times daily. , Disp: , Rfl:  .  fluticasone  (FLONASE) 50 MCG/ACT nasal spray, Place 2 sprays into the nose as needed for allergies or rhinitis. , Disp: , Rfl:  .  INCRUSE ELLIPTA 62.5 MCG/INH AEPB, INHALE ONE PUFF ONCE DAILY, Disp: 30 each, Rfl: 5 .  mometasone-formoterol (DULERA) 100-5 MCG/ACT AERO, Inhale 2 puffs into the lungs 2 (two) times daily., Disp: 13 g, Rfl: 5 .  Multiple Vitamins-Minerals (MULTIVITAMIN WITH MINERALS) tablet, Take 1 tablet by mouth daily.  , Disp: , Rfl:  .  SINGULAIR 10 MG tablet, Take 10 mg by mouth Daily. , Disp: , Rfl:  .  Spacer/Aero-Holding Chambers (AEROCHAMBER MV) inhaler, Use as instructed, Disp: 1 each, Rfl: 0   Allergies  Allergen Reactions  . Tiotropium Bromide Monohydrate Hives, Shortness Of Breath and Rash  . Codeine Other (See Comments)    Hyperactivity,crying  . Asa [Aspirin] Other (See Comments)    Bleeding -ulcers    Immunization History  Administered Date(s) Administered  . Influenza Split 04/07/2013, 03/07/2014  . Influenza Whole 02/06/2011, 03/07/2012  . Influenza,inj,Quad PF,36+ Mos 01/27/2015  . Influenza-Unspecified 01/05/2014  . Pneumococcal Conjugate-13 10/05/2013  . Pneumococcal Polysaccharide-23 06/08/2003  . Pneumococcal-Unspecified 11/05/2012       Review of Systems Per hpi    Objective:   Physical Exam Filed Vitals:   08/12/15 1054  BP: 130/58  Pulse: 87  Height: '4\' 10"'$  (1.473 m)  Weight: 119 lb (53.978 kg)  SpO2: 98%   Gen. exam: Frail elderly female seated comfortably. Looks much better than when I saw her in the hospital through camera HEENT: Oxygen on. No neck nodes no elevated JVP Psychiatric: Pleasant affect CNS t.: Alert and oriented 3. Speech normal Cardio vessel: Regular rate and rhythm. Normal heart sounds no murmurs Respiratory: Barrel chest: Prolonged expiration. No wheeze. Baseline purse her breathing present no respiratory distress Abdomen: Soft nontender nor megaly Extremity is: No cyanosis no clubbing no edema Skin exam: Intact on the  exposed areas      Assessment:       ICD-9-CM ICD-10-CM   1. COPD, very severe ([place]) 496 J44.9   2. Chronic respiratory failure with hypoxia (HCC) 518.83 J96.11    799.02         Plan:      Glad you're improved from your recent flareup I think you have set  new lower baseline Continue incruse Chagne dulera to breo low dose once daily due to insurance reassons Use o2 all times Respect decision to defer rehab  followup 2 months or sooner if needed     Dr. Brand Males, M.D., [facility].C.P Pulmonary and Critical Care Medicine Staff Physician [place] Pager: [phone], If no answer or between  15:00h - 7:00h: call [phone]  08/12/2015 11:29 AM

## 2015-08-12 NOTE — Telephone Encounter (Signed)
Pt was seen today 08/12/2015 by MR and pt's Dulera was changed to Breo 100. Pt aware. Nothing further needed at this time.

## 2015-08-12 NOTE — Telephone Encounter (Signed)
Could not find patient with Key: [id number]  Re-submitted Tier Exception through [place]: PWGEV4 Status: PA Request - Sent to Plan on March 7th, 2017  Drug: Ruthe Mannan 100-5MCG/ACT aerosol  Form: [employer] [place] Medicare Part D Tier Exception .------------------------- Awaiting determination.

## 2015-08-12 NOTE — Patient Instructions (Signed)
ICD-9-CM ICD-10-CM   1. COPD, very severe ([place]) 496 J44.9   2. Chronic respiratory failure with hypoxia (HCC) 518.83 J96.11    799.02      Glad you're improved from your recent flareup I think you have set  new lower baseline Continue incruse Chagne dulera to breo low dose once daily Use o2 all times Respect decision to defer rehab  followup 2 months or sooner if needed

## 2015-08-13 DIAGNOSIS — J441 Chronic obstructive pulmonary disease with (acute) exacerbation: Secondary | ICD-10-CM | POA: Diagnosis not present

## 2015-08-13 DIAGNOSIS — I2510 Atherosclerotic heart disease of native coronary artery without angina pectoris: Secondary | ICD-10-CM | POA: Diagnosis not present

## 2015-08-13 DIAGNOSIS — J45909 Unspecified asthma, uncomplicated: Secondary | ICD-10-CM | POA: Diagnosis not present

## 2015-08-13 DIAGNOSIS — J13 Pneumonia due to Streptococcus pneumoniae: Secondary | ICD-10-CM | POA: Diagnosis not present

## 2015-08-13 DIAGNOSIS — D649 Anemia, unspecified: Secondary | ICD-10-CM | POA: Diagnosis not present

## 2015-08-13 DIAGNOSIS — J440 Chronic obstructive pulmonary disease with acute lower respiratory infection: Secondary | ICD-10-CM | POA: Diagnosis not present

## 2015-08-18 DIAGNOSIS — J13 Pneumonia due to Streptococcus pneumoniae: Secondary | ICD-10-CM | POA: Diagnosis not present

## 2015-08-18 DIAGNOSIS — J441 Chronic obstructive pulmonary disease with (acute) exacerbation: Secondary | ICD-10-CM | POA: Diagnosis not present

## 2015-08-18 DIAGNOSIS — I2510 Atherosclerotic heart disease of native coronary artery without angina pectoris: Secondary | ICD-10-CM | POA: Diagnosis not present

## 2015-08-18 DIAGNOSIS — D649 Anemia, unspecified: Secondary | ICD-10-CM | POA: Diagnosis not present

## 2015-08-18 DIAGNOSIS — J45909 Unspecified asthma, uncomplicated: Secondary | ICD-10-CM | POA: Diagnosis not present

## 2015-08-18 DIAGNOSIS — J440 Chronic obstructive pulmonary disease with acute lower respiratory infection: Secondary | ICD-10-CM | POA: Diagnosis not present

## 2015-09-02 ENCOUNTER — Ambulatory Visit: Payer: Medicare Other | Admitting: Internal Medicine

## 2015-09-15 DIAGNOSIS — J449 Chronic obstructive pulmonary disease, unspecified: Secondary | ICD-10-CM | POA: Diagnosis not present

## 2015-09-15 DIAGNOSIS — J4520 Mild intermittent asthma, uncomplicated: Secondary | ICD-10-CM | POA: Diagnosis not present

## 2015-09-15 DIAGNOSIS — M810 Age-related osteoporosis without current pathological fracture: Secondary | ICD-10-CM | POA: Diagnosis not present

## 2015-09-15 DIAGNOSIS — E782 Mixed hyperlipidemia: Secondary | ICD-10-CM | POA: Diagnosis not present

## 2015-09-15 DIAGNOSIS — C3490 Malignant neoplasm of unspecified part of unspecified bronchus or lung: Secondary | ICD-10-CM | POA: Diagnosis not present

## 2015-09-15 DIAGNOSIS — E041 Nontoxic single thyroid nodule: Secondary | ICD-10-CM | POA: Diagnosis not present

## 2015-09-15 DIAGNOSIS — K219 Gastro-esophageal reflux disease without esophagitis: Secondary | ICD-10-CM | POA: Diagnosis not present

## 2015-09-15 DIAGNOSIS — J309 Allergic rhinitis, unspecified: Secondary | ICD-10-CM | POA: Diagnosis not present

## 2015-09-23 DIAGNOSIS — Z1389 Encounter for screening for other disorder: Secondary | ICD-10-CM | POA: Diagnosis not present

## 2015-09-23 DIAGNOSIS — E785 Hyperlipidemia, unspecified: Secondary | ICD-10-CM | POA: Diagnosis not present

## 2015-09-23 DIAGNOSIS — C3490 Malignant neoplasm of unspecified part of unspecified bronchus or lung: Secondary | ICD-10-CM | POA: Diagnosis not present

## 2015-09-23 DIAGNOSIS — M810 Age-related osteoporosis without current pathological fracture: Secondary | ICD-10-CM | POA: Diagnosis not present

## 2015-09-23 DIAGNOSIS — J449 Chronic obstructive pulmonary disease, unspecified: Secondary | ICD-10-CM | POA: Diagnosis not present

## 2015-09-23 DIAGNOSIS — J9611 Chronic respiratory failure with hypoxia: Secondary | ICD-10-CM | POA: Diagnosis not present

## 2015-09-23 DIAGNOSIS — Z86010 Personal history of colonic polyps: Secondary | ICD-10-CM | POA: Diagnosis not present

## 2015-09-23 DIAGNOSIS — J309 Allergic rhinitis, unspecified: Secondary | ICD-10-CM | POA: Diagnosis not present

## 2015-10-16 ENCOUNTER — Ambulatory Visit: Payer: Medicare Other | Admitting: Internal Medicine

## 2015-11-11 ENCOUNTER — Encounter: Payer: Self-pay | Admitting: Internal Medicine

## 2015-11-11 ENCOUNTER — Ambulatory Visit (INDEPENDENT_AMBULATORY_CARE_PROVIDER_SITE_OTHER): Payer: Medicare Other | Admitting: Internal Medicine

## 2015-11-11 VITALS — BP 130/68 | HR 75 | Ht <= 58 in | Wt 122.2 lb

## 2015-11-11 DIAGNOSIS — J449 Chronic obstructive pulmonary disease, unspecified: Secondary | ICD-10-CM

## 2015-11-11 DIAGNOSIS — C342 Malignant neoplasm of middle lobe, bronchus or lung: Secondary | ICD-10-CM

## 2015-11-11 DIAGNOSIS — J9611 Chronic respiratory failure with hypoxia: Secondary | ICD-10-CM | POA: Diagnosis not present

## 2015-11-11 NOTE — Patient Instructions (Addendum)
ICD-9-CM ICD-10-CM   1. COPD, very severe ([place]) 496 J44.9   2. Chronic respiratory failure with hypoxia (HCC) 518.83 J96.11    799.02    3. Lung cancer, middle lobe (HCC) 162.4 C34.2    Stable  Plan Continue o2, incruse , dulera as before Monitor o2 levels - adjust it to keep it > 86% atleast Do ct chest wo contrast mid July 2017 Flu shot in fall  Followup  - will call with CT chest results  - rov 6 months or sooner if needed

## 2015-11-11 NOTE — Addendum Note (Signed)
Addended by: Len Blalock on: 11/11/2015 11:23 AM   Modules accepted: Orders

## 2015-11-11 NOTE — Progress Notes (Signed)
Subjective:     Patient ID: Heather Barnes, female   DOB: 1933-11-12, 82 y.o.   MRN: [id number]  HPI  OV 08/12/2015   Chief Complaint  Patient presents with  . Follow-up    Pt last seen by SG for HFU. pt states her breathing has improved since last OV. Pt states she has a mild dry cough. Pt states her SOB is still not back to baseline. pt wearing O2 more frequently. Pt denies CP/tightness.    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 She spent 8 days in the hospital. Then discharged. She presents for follow-up. Since then her physical conditioning has improved. Nevertheless she still away from baseline he did she has more shortness of breath and baseline. She has more exertional hypoxemia than baseline. She reports that preadmission she is to be able to walk 10 or 20 feet on room air without desaturation but now she desaturates into the 80s. She feels she needs her oxygen 24 7. Due to largest degrees and she does not want to attend pulmonary rehabilitation again. She also feels more fatigued but overall things are improving.  Daughter is in with her for the first time.   OV 11/11/2015  Chief Complaint  Patient presents with  . Follow-up    pt c.o sob with exertion, wheezing.  Pt is now on continuous 02.      Follow-up advanced very severe COPD with chronic hypoxemic respiratory failure: COPD CAT 27 and she says this is her new baseline. She does desaturate more easily now following admission earlier this year for pneumonia. She takes rest to adjust oxygen levels. She is on 2 L continuous oxygen. She is somewhat reluctant to use 3 L continuously. She only wants the next follow-up in 6 months  History of lung cancer right middle lobe status post radiation: Last CT scan of the chest was in July 2016 and this was in remission. Her next CT to  do in July 2017.    CAT COPD Symptom & Quality of Life Score (GSK trademark) 0 is no burden. 5 is highest burden 11/11/2015   Never Cough -> Cough all the time 3  No phlegm in chest -> Chest is full of phlegm 3  No chest tightness -> Chest feels very tight 3  No dyspnea for 1 flight stairs/hill -> Very dyspneic for 1 flight of stairs 5  No limitations for ADL at home -> Very limited with ADL at home 4  Confident leaving home -> Not at all confident leaving home 3  Sleep soundly -> Do not sleep soundly because of lung condition 3  Lots of Energy -> No energy at all 3  TOTAL Score (max 40)  27          has a past medical history of Asthma; colonic polyps; Osteoporosis; Hyperlipidemia; Goiter; Shortness of breath; COPD (chronic obstructive pulmonary disease) ([place]); Lung cancer, middle lobe ([place]) (04/26/2011); Cataract; Allergy; Colon adenomas; and History of radiation therapy (05/24/11-06/02/11).   reports that she quit smoking about 17 years ago. Her smoking use included Cigarettes. She has a 20 pack-year smoking history. She has never used smokeless tobacco.  Past Surgical History  Procedure Laterality Date  . Total abdominal hysterectomy    . Tonsillectomy    . Eye surgery    . Colonoscopy w/ biopsies    . Biopsy thyroid  2016  Allergies  Allergen Reactions  . Tiotropium Bromide Monohydrate Hives, Shortness Of Breath and Rash  . Codeine Other (See Comments)    Hyperactivity,crying  . Asa [Aspirin] Other (See Comments)    Bleeding -ulcers    Immunization History  Administered Date(s) Administered  . Influenza Split 04/07/2013, 03/07/2014  . Influenza Whole 02/06/2011, 03/07/2012  . Influenza,inj,Quad PF,36+ Mos 01/27/2015  . Influenza-Unspecified 01/05/2014  . Pneumococcal Conjugate-13 10/05/2013  . Pneumococcal Polysaccharide-23 06/08/2003  . Pneumococcal-Unspecified 11/05/2012    Family History  Problem Relation Age of Onset  . Emphysema Maternal Uncle   .  Heart disease Mother   . Heart disease Maternal Grandfather   . Rheum arthritis Sister   . Cancer Sister   . Cancer Sister     throat cancer  . Brain cancer Sister   . Cancer Sister   . Ovarian cancer Sister   . Breast cancer Daughter      Current outpatient prescriptions:  .  albuterol (PROAIR HFA) 108 (90 BASE) MCG/ACT inhaler, Inhale 2 puffs into the lungs every 6 (six) hours as needed for wheezing or shortness of breath. , Disp: , Rfl:  .  alendronate (FOSAMAX) 70 MG tablet, Take 70 mg by mouth every 7 (seven) days. Take with a full glass of water on an empty stomach. Saturday, Disp: , Rfl:  .  atorvastatin (LIPITOR) 40 MG tablet, Take 1 tablet (40 mg total) by mouth daily., Disp: 90 tablet, Rfl: 3 .  cetirizine (ZYRTEC) 10 MG tablet, Take 10 mg by mouth daily.  , Disp: , Rfl:  .  Cholecalciferol (VITAMIN D) 2000 UNITS CAPS, Take 1 capsule by mouth 2 (two) times daily. , Disp: , Rfl:  .  fluticasone (FLONASE) 50 MCG/ACT nasal spray, Place 2 sprays into the nose as needed for allergies or rhinitis. , Disp: , Rfl:  .  fluticasone furoate-vilanterol (BREO ELLIPTA) 100-25 MCG/INH AEPB, Inhale 1 puff into the lungs daily., Disp: 60 each, Rfl: 11 .  INCRUSE ELLIPTA 62.5 MCG/INH AEPB, INHALE ONE PUFF ONCE DAILY, Disp: 30 each, Rfl: 5 .  mometasone-formoterol (DULERA) 100-5 MCG/ACT AERO, Inhale 2 puffs into the lungs 2 (two) times daily., Disp: 13 g, Rfl: 5 .  Multiple Vitamins-Minerals (MULTIVITAMIN WITH MINERALS) tablet, Take 1 tablet by mouth daily.  , Disp: , Rfl:  .  SINGULAIR 10 MG tablet, Take 10 mg by mouth Daily. , Disp: , Rfl:  .  Spacer/Aero-Holding Chambers (AEROCHAMBER MV) inhaler, Use as instructed, Disp: 1 each, Rfl: 0    Review of Systems     Objective:   Physical Exam  Constitutional: She is oriented to person, place, and time. She appears well-developed and well-nourished. No distress.  Body mass index is 25.55 kg/(m^2).    HENT:  Head: Normocephalic and  atraumatic.  Right Ear: External ear normal.  Left Ear: External ear normal.  Mouth/Throat: Oropharynx is clear and moist. No oropharyngeal exudate.  o2 on  Eyes: Conjunctivae and EOM are normal. Pupils are equal, round, and reactive to light. Right eye exhibits no discharge. Left eye exhibits no discharge. No scleral icterus.  Neck: Normal range of motion. Neck supple. No JVD present. No tracheal deviation present. No thyromegaly present.  Cardiovascular: Normal rate, regular rhythm, normal heart sounds and intact distal pulses.  Exam reveals no gallop and no friction rub.   No murmur heard. Pulmonary/Chest: Effort normal and breath sounds normal. No respiratory distress. She has no wheezes. She has no rales. She exhibits no tenderness.  Barrel  chest Purse lip breathing No wheeze Overall diminished  Similar to baseline  Abdominal: Soft. Bowel sounds are normal. She exhibits no distension and no mass. There is no tenderness. There is no rebound and no guarding.  Musculoskeletal: Normal range of motion. She exhibits no edema or tenderness.  Lymphadenopathy:    She has no cervical adenopathy.  Neurological: She is alert and oriented to person, place, and time. She has normal reflexes. No cranial nerve deficit. She exhibits normal muscle tone. Coordination normal.  Skin: Skin is warm and dry. No rash noted. She is not diaphoretic. No erythema. No pallor.  Psychiatric: She has a normal mood and affect. Her behavior is normal. Judgment and thought content normal.  Vitals reviewed.  Filed Vitals:   11/11/15 1053  BP: 130/68  Pulse: 75  Height: '4\' 10"'$  (1.473 m)  Weight: 122 lb 3.2 oz (55.43 kg)  SpO2: 96%        Assessment:       ICD-9-CM ICD-10-CM   1. COPD, very severe ([place]) 496 J44.9   2. Chronic respiratory failure with hypoxia (HCC) 518.83 J96.11    799.02    3. Lung cancer, middle lobe (HCC) 162.4 C34.2         Plan:      Stable  Plan Continue o2, incruse , dulera  as before Monitor o2 levels - adjust it to keep it > 86% atleast Do ct chest wo contrast mid July 2017 Flu shot in fall  Followup  - will call with CT chest results  - rov 6 months or sooner if needed   Dr. Brand Males, M.D., [facility].C.P Pulmonary and Critical Care Medicine Staff Physician [place] [facility] Pager: [phone], If no answer or between  15:00h - 7:00h: call [phone]  11/11/2015 11:20 AM

## 2015-12-11 ENCOUNTER — Other Ambulatory Visit: Payer: Self-pay | Admitting: Family Medicine

## 2015-12-11 DIAGNOSIS — E041 Nontoxic single thyroid nodule: Secondary | ICD-10-CM

## 2015-12-23 ENCOUNTER — Ambulatory Visit (INDEPENDENT_AMBULATORY_CARE_PROVIDER_SITE_OTHER)
Admission: RE | Admit: 2015-12-23 | Discharge: 2015-12-23 | Disposition: A | Discharge status: Home / Self Care | Payer: Medicare Other | Source: Ambulatory Visit | Attending: Internal Medicine | Admitting: Internal Medicine

## 2015-12-23 ENCOUNTER — Encounter (INDEPENDENT_AMBULATORY_CARE_PROVIDER_SITE_OTHER): Payer: Self-pay

## 2015-12-23 DIAGNOSIS — J8410 Pulmonary fibrosis, unspecified: Secondary | ICD-10-CM | POA: Diagnosis not present

## 2015-12-23 DIAGNOSIS — C342 Malignant neoplasm of middle lobe, bronchus or lung: Secondary | ICD-10-CM

## 2015-12-25 ENCOUNTER — Telehealth: Payer: Self-pay | Admitting: Internal Medicine

## 2015-12-25 NOTE — Telephone Encounter (Signed)
pls let Dajiah Kooi Criado know that ct shows no recurrence in cancer or progression of radiation fibrosis in local area of radiation. Nothing new findings either  Ct Chest Wo Contrast  12/23/2015  CLINICAL DATA:  History lung cancer diagnosed in 2012. Right middle lobe primary. Treated with radiation therapy. On oxygen for COPD. EXAM: CT CHEST WITHOUT CONTRAST TECHNIQUE: Multidetector CT imaging of the chest was performed following the standard protocol without IV contrast. COMPARISON:  Chest radiograph of 06/26/2015. Most recent CT of 12/20/2014. FINDINGS: Cardiovascular: Aortic and branch vessel atherosclerosis. Borderline cardiomegaly, without pericardial effusion. Multivessel coronary artery atherosclerosis. Mediastinum/Nodes: No mediastinal or definite hilar adenopathy, given limitations of unenhanced CT. Lungs/Pleura: No pleural fluid. Moderate to severe centrilobular emphysema. Minimal motion degradation inferiorly. Bibasilar scarring. Lateral right middle lobe radiation change, including on image 93/series 3. This is similar, given thinner slice collimation today. Upper Abdomen: Well-circumscribed tiny hypo attenuating liver lesions are likely cysts. Normal imaged portions of the spleen, stomach, pancreas, adrenal glands. An upper pole left renal lesion measures 9 mm and greater than fluid density, including at 63 HU on image 120/series 2. Incompletely imaged interpolar left renal lesion is likely a cyst. Hyperattenuating focus in the upper pole right kidney measures less than 1 cm and 68 HU on image 127/series 2. Abdominal aortic atherosclerosis. Musculoskeletal: Probable radiation necrosis involving the eighth and ninth posterior lateral right ribs, adjacent to the radiation fibrosis. Similar T9 compression deformity. IMPRESSION: 1. Similar lateral right middle lobe radiation fibrosis, without locally recurrent or metastatic disease. 2. Centrilobular emphysema with bibasilar scarring. 3.  Coronary  artery atherosclerosis. Aortic atherosclerosis. 4. Bilateral upper pole renal lesions which are likely complex cysts but technically indeterminate on this noncontrast exam. These are unchanged in size since the prior. Electronically Signed   By: Abigail Miyamoto M.D.   On: 12/23/2015 11:44

## 2015-12-31 NOTE — Telephone Encounter (Signed)
Spoke with pt, aware of results/recs.  Nothing further needed.  

## 2016-01-02 ENCOUNTER — Other Ambulatory Visit: Payer: Self-pay | Admitting: Internal Medicine

## 2016-01-05 ENCOUNTER — Telehealth: Payer: Self-pay | Admitting: Internal Medicine

## 2016-01-05 NOTE — Telephone Encounter (Signed)
Pt is requesting Breo samples  I advised according to last AVS she should be taking incruse and dulera  She disagrees and states MR told her to take Breo  I see no documentation of this MR, please advise thanks

## 2016-01-05 NOTE — Telephone Encounter (Signed)
Spoke with pt. Advised her that we are still waiting on MR's response. She verbalized understanding. Once we figure out what medication she is supposed to be on, she would like to apply for patient assistance.  MR - please advise. Thanks.

## 2016-01-05 NOTE — Telephone Encounter (Signed)
[phone] pt calling back

## 2016-01-06 MED ORDER — UMECLIDINIUM BROMIDE 62.5 MCG/INH IN AEPB: 1.0000 | INHALATION_SPRAY | Freq: Every day | RESPIRATORY_TRACT | 0 refills | Status: AC

## 2016-01-06 NOTE — Telephone Encounter (Signed)
[phone] pt calling back

## 2016-01-06 NOTE — Telephone Encounter (Signed)
[phone] pt calling back

## 2016-01-06 NOTE — Telephone Encounter (Signed)
Per pt's chart, at the 3.7.17 visit with MR pt instructions state that [facility] is being changed to Breo 100 d/t tier change.  However at the 6.6.17 visit, pt was instructed to continue the Incruse and Dulera.  Pt is aware we will need to have MR clarify.  Patient did mention that she is in the donut hole and nearly out of her Incruse with enough to last her until this coming Sunday.  Advised pt will gladly provide some samples and a patient assistance form.  Pt is aware to fill out her portion of the application and return it to [place] to do the Rx and fax it to [facility].    Samples documented per protocol MR please advise on Dulera or Breo, thanks

## 2016-01-06 NOTE — Telephone Encounter (Signed)
LMTCB

## 2016-01-09 NOTE — Telephone Encounter (Signed)
I probably forgot to document why in June I told her to do dulera. She might have told me in that June visit  either breo was expensive or was not working well. It does not matter, she can do either/or. If she is not sure you can give her -  Breo low dose - might be more convenient  Definitely give samples  Sorry for confusion

## 2016-01-09 NOTE — Telephone Encounter (Signed)
lmtcb x1 for pt. 

## 2016-01-12 NOTE — Telephone Encounter (Signed)
Spoke with pt. She is aware of MR's recommendation. Samples of Breo have been left up front for the pt. Nothing further was needed.

## 2016-01-13 ENCOUNTER — Ambulatory Visit
Admission: RE | Admit: 2016-01-13 | Discharge: 2016-01-13 | Disposition: A | Discharge status: Home / Self Care | Payer: Medicare Other | Source: Ambulatory Visit | Attending: Family Medicine | Admitting: Family Medicine

## 2016-01-13 DIAGNOSIS — E042 Nontoxic multinodular goiter: Secondary | ICD-10-CM | POA: Diagnosis not present

## 2016-01-13 DIAGNOSIS — E041 Nontoxic single thyroid nodule: Secondary | ICD-10-CM

## 2016-01-19 ENCOUNTER — Ambulatory Visit (INDEPENDENT_AMBULATORY_CARE_PROVIDER_SITE_OTHER): Payer: Medicare Other | Admitting: Cardiovascular Disease

## 2016-01-19 ENCOUNTER — Encounter: Payer: Self-pay | Admitting: Cardiovascular Disease

## 2016-01-19 VITALS — BP 134/66 | HR 82 | Ht <= 58 in | Wt 121.8 lb

## 2016-01-19 DIAGNOSIS — I259 Chronic ischemic heart disease, unspecified: Secondary | ICD-10-CM | POA: Diagnosis not present

## 2016-01-19 DIAGNOSIS — R002 Palpitations: Secondary | ICD-10-CM | POA: Diagnosis not present

## 2016-01-19 DIAGNOSIS — E785 Hyperlipidemia, unspecified: Secondary | ICD-10-CM

## 2016-01-19 HISTORY — DX: Palpitations: R00.2

## 2016-01-19 NOTE — Progress Notes (Signed)
Cardiology Office Note   Date:  01/19/2016   ID:  Heather Barnes, DOB 16-Aug-1933, MRN [id number]  PCP:  [name], MD  Cardiologist:   Skeet Latch, MD   Chief Complaint  Patient presents with  . 1 year f/u    pt states she has severe COPD, c/o SOB on minimal exertion. No other Sx.      History of Present Illness: Heather Barnes is a 82 y.o. female with severe COPD and lung cancer in remission who presents for follow up.  She was initially evaluated 01/2015 due to coronary calcifications in the LM, LAD and RCA identified on cardiac CT.   [name] reported exertional dyspnea that had been getting progressively worse for years.  This was attributed to both her COPD and lung cancer and she declined stress testing.  [name] can't tolerate aspirin due to bleeding ulcers.  She was started on atorvastatin, which has tolerated well.  Ms. Calkin was hospitalized with pneumonia in January and has had a hard time recovering.  She is now on contiuous oxygen.  She continues to have severe dyspnea with minimal exertion.  She denies chest pain or pressure.  [name] notices that her heart beat is irradic, especailly in the afternoon.  This started while in the hospital.  She checks her heart rate on her pulse oxymeter and it ranges from 40-110 and is erratic.  It occurred 2-3 times in the last week.  It typically occurs at the end of the day when she is especially tired. She drinks one and a half cups of coffee each morning but none later in the day. She does not use any over-the-counter medications.   The episodes last for approximately one hour. She does not feel any more short of breath usual but she does sometimes notices that it causes her to wheeze.  She denies any associated lightheadedness or dizziness.  She notes swelling in her varicose veins if she stands for prolonged periods of time.  However she denies lower extremity edema, orthopnea or PND.   Past Medical History:    Diagnosis Date  . Allergy    seasonal pollen  . Asthma   . Cataract    bilat.cataract/lens implant  . Colon adenomas   . COPD (chronic obstructive pulmonary disease) ([place])   . Goiter   . History of radiation therapy 05/24/11-06/02/11   R middle lobe lung/ 60 gray  . Hx of colonic polyps   . Hyperlipidemia   . Lung cancer, middle lobe ([place]) 04/26/2011  . Osteoporosis   . Shortness of breath     Past Surgical History:  Procedure Laterality Date  . BIOPSY THYROID  2016  . COLONOSCOPY W/ BIOPSIES    . EYE SURGERY    . TONSILLECTOMY    . TOTAL ABDOMINAL HYSTERECTOMY       Current Outpatient Prescriptions  Medication Sig Dispense Refill  . albuterol (PROAIR HFA) 108 (90 BASE) MCG/ACT inhaler Inhale 2 puffs into the lungs every 6 (six) hours as needed for wheezing or shortness of breath.     [name] alendronate (FOSAMAX) 70 MG tablet Take 70 mg by mouth every 7 (seven) days. Take with a full glass of water on an empty stomach. Saturday    . atorvastatin (LIPITOR) 40 MG tablet Take 1 tablet (40 mg total) by mouth daily. 90 tablet 3  . cetirizine (ZYRTEC) 10 MG tablet Take 10 mg by mouth daily.      . Cholecalciferol (  VITAMIN D) 2000 UNITS CAPS Take 1 capsule by mouth 2 (two) times daily.     . fluticasone (FLONASE) 50 MCG/ACT nasal spray Place 2 sprays into the nose as needed for allergies or rhinitis.     . fluticasone furoate-vilanterol (BREO ELLIPTA) 100-25 MCG/INH AEPB Inhale 1 puff into the lungs daily. 60 each 11  . INCRUSE ELLIPTA 62.5 MCG/INH AEPB INHALE ONE PUFF ONCE DAILY 30 each 5  . mometasone-formoterol (DULERA) 100-5 MCG/ACT AERO Inhale 2 puffs into the lungs 2 (two) times daily. 13 g 5  . Multiple Vitamins-Minerals (MULTIVITAMIN WITH MINERALS) tablet Take 1 tablet by mouth daily.      [name] SINGULAIR 10 MG tablet Take 10 mg by mouth Daily.     [name] Spacer/Aero-Holding Chambers (AEROCHAMBER MV) inhaler Use as instructed 1 each 0   No current facility-administered medications for  this visit.     Allergies:   Tiotropium bromide monohydrate; Codeine; and Asa [aspirin]    Social History:  The patient  reports that she quit smoking about 17 years ago. Her smoking use included Cigarettes. She has a 20.00 pack-year smoking history. She has never used smokeless tobacco. She reports that she does not drink alcohol or use drugs.   Family History:  The patient's family history includes Brain cancer in her sister; Breast cancer in her daughter; Cancer in her sister, sister, and sister; Emphysema in her maternal uncle; Heart disease in her maternal grandfather and mother; Ovarian cancer in her sister; Rheum arthritis in her sister.    ROS:  Please see the history of present illness.   Otherwise, review of systems are positive for none.   All other systems are reviewed and negative.    PHYSICAL EXAM: VS:  BP 134/66 (BP Location: Right Arm, Patient Position: Sitting, Cuff Size: Normal)   Pulse 82   Ht '4\' 10"'$  (1.473 m)   Wt 121 lb 12.8 oz (55.2 kg)   SpO2 (!) 89%   BMI 25.46 kg/m  , BMI Body mass index is 25.46 kg/m. GENERAL:  Well appearing HEENT:  Pupils equal round and reactive, fundi not visualized, oral mucosa unremarkable NECK:  No jugular venous distention, waveform within normal limits, carotid upstroke brisk and symmetric, no bruits, no thyromegaly LYMPHATICS:  No cervical adenopathy LUNGS:  Clear to auscultation bilaterally HEART:  RRR.  PMI not displaced or sustained,S1 and S2 within normal limits, no S3, no S4, no clicks, no rubs, no murmurs ABD:  Flat, positive bowel sounds normal in frequency in pitch, no bruits, no rebound, no guarding, no midline pulsatile mass, no hepatomegaly, no splenomegaly EXT:  2 plus pulses throughout, no edema, no cyanosis no clubbing SKIN:  No rashes no nodules NEURO:  Cranial nerves II through XII grossly intact, motor grossly intact throughout PSYCH:  Cognitively intact, oriented to person place and time   EKG:  EKG is ordered  today. The ekg ordered today demonstrates sinus rhythm at 82 bpm.     Chest CT 12/20/14: Atherosclerosis of the thoracic aorta, mediastinal great vessels, LM, LAD and RCA.  Recent Labs: 06/12/2015: Magnesium 2.4 06/13/2015: ALT 42; BUN 14; Creatinine, Ser 0.42; Hemoglobin 11.8; Platelets 223; Potassium 3.9; Sodium 143    Lipid Panel    Component Value Date/Time   CHOL 184 04/17/2015 1001   TRIG 139 04/17/2015 1001   HDL 75 04/17/2015 1001   CHOLHDL 2.5 04/17/2015 1001   VLDL 28 04/17/2015 1001   LDLCALC 81 04/17/2015 1001      Wt  Readings from Last 3 Encounters:  01/19/16 121 lb 12.8 oz (55.2 kg)  11/11/15 122 lb 3.2 oz (55.4 kg)  08/12/15 119 lb (54 kg)      ASSESSMENT AND PLAN:  # Asymptomatic coronary calcifications: Ms. Maxie Better remains asymptomatic. Continue atorvastatin. She is unable to tolerate beta blockers or aspirin.  We will check fasting lipids and a CMP.  # Palpitations: Given her severe COPD, she may be having episodes of atrial fibrillation.  We will place a 7 day event monitor and check a CBC, CMP, magnesium, TSH and free T4.  Current medicines are reviewed at length with the patient today.  The patient does not have concerns regarding medicines.  The following changes have been made:  Start atorvastatin  Labs/ tests ordered today include: none  No orders of the defined types were placed in this encounter.    Disposition:   FU with Dr. Jonelle Sidle C. [place] in 1 month   Signed, Skeet Latch, MD  01/19/2016 10:12 AM    [place]

## 2016-01-19 NOTE — Patient Instructions (Signed)
Medication Instructions:  Your physician recommends that you continue on your current medications as directed. Please refer to the Current Medication list given to you today.  Labwork: Lp/bmet/cbc/magnesium/tsh/ft4 soon. Nothing to eat or drink prior except water  Testing/Procedures: Your physician has recommended that you wear an event monitor. Event monitors are medical devices that record the heart's electrical activity. Doctors most often [place] these monitors to diagnose arrhythmias. Arrhythmias are problems with the speed or rhythm of the heartbeat. The monitor is a small, portable device. You can wear one while you do your normal daily activities. This is usually used to diagnose what is causing palpitations/syncope (passing out). 7 day monitor  Follow-Up: Your physician recommends that you schedule a follow-up appointment in: 1 month ov  If you need a refill on your cardiac medications before your next appointment, please call your pharmacy.

## 2016-01-21 ENCOUNTER — Ambulatory Visit (INDEPENDENT_AMBULATORY_CARE_PROVIDER_SITE_OTHER): Payer: Medicare Other

## 2016-01-21 ENCOUNTER — Other Ambulatory Visit: Payer: Medicare Other | Admitting: *Deleted

## 2016-01-21 DIAGNOSIS — R002 Palpitations: Secondary | ICD-10-CM

## 2016-01-21 DIAGNOSIS — J9611 Chronic respiratory failure with hypoxia: Secondary | ICD-10-CM

## 2016-01-21 DIAGNOSIS — E784 Other hyperlipidemia: Secondary | ICD-10-CM | POA: Diagnosis not present

## 2016-01-21 DIAGNOSIS — J449 Chronic obstructive pulmonary disease, unspecified: Secondary | ICD-10-CM

## 2016-01-21 LAB — CBC WITH DIFFERENTIAL/PLATELET
BASOS ABS: 0 {cells}/uL (ref 0–200)
Basophils Relative: 0 %
EOS ABS: 134 {cells}/uL (ref 15–500)
Eosinophils Relative: 2 %
HEMATOCRIT: 40.2 % (ref 35.0–45.0)
HEMOGLOBIN: 13.2 g/dL (ref 11.7–15.5)
LYMPHS ABS: 1541 {cells}/uL (ref 850–3900)
LYMPHS PCT: 23 %
MCH: 28.2 pg (ref 27.0–33.0)
MCHC: 32.8 g/dL (ref 32.0–36.0)
MCV: 85.9 fL (ref 80.0–100.0)
MONO ABS: 536 {cells}/uL (ref 200–950)
MPV: 9 fL (ref 7.5–12.5)
Monocytes Relative: 8 %
NEUTROS PCT: 67 %
Neutro Abs: 4489 cells/uL (ref 1500–7800)
Platelets: 213 10*3/uL (ref 140–400)
RBC: 4.68 MIL/uL (ref 3.80–5.10)
RDW: 14.5 % (ref 11.0–15.0)
WBC: 6.7 10*3/uL (ref 3.8–10.8)

## 2016-01-21 LAB — BASIC METABOLIC PANEL
BUN: 10 mg/dL (ref 7–25)
CALCIUM: 9.8 mg/dL (ref 8.6–10.4)
CO2: 32 mmol/L — AB (ref 20–31)
Chloride: 100 mmol/L (ref 98–110)
Creat: 0.6 mg/dL (ref 0.60–0.88)
GLUCOSE: 93 mg/dL (ref 65–99)
Potassium: 4.4 mmol/L (ref 3.5–5.3)
SODIUM: 140 mmol/L (ref 135–146)

## 2016-01-21 LAB — LIPID PANEL
CHOL/HDL RATIO: 2.2 ratio (ref <=5.0)
Cholesterol: 186 mg/dL (ref 125–200)
HDL: 86 mg/dL (ref >=46)
LDL Cholesterol: 81 mg/dL (ref <130)
Triglycerides: 97 mg/dL (ref <150)
VLDL: 19 mg/dL (ref <30)

## 2016-01-21 LAB — MAGNESIUM: Magnesium: 2 mg/dL (ref 1.5–2.5)

## 2016-01-21 LAB — T4, FREE: Free T4: 1.1 ng/dL (ref 0.8–1.8)

## 2016-01-21 LAB — TSH: TSH: 1.13 mIU/L

## 2016-01-26 ENCOUNTER — Telehealth: Payer: Self-pay | Admitting: Cardiovascular Disease

## 2016-01-26 NOTE — Telephone Encounter (Signed)
pt notes she has been on lipitor before. had stopped use of this medication in the past for "achy bones". now has been on for 1 year and now getting muscle pains in arms, feels like joints are sore.  she notes she stopped fosomax. takes 1 time a week but has stopped taking bc she has had to stop it before when experiencing these symptoms as well.  advised to hold the lipitor for now, & that I will inquire to Dr. Oval Linsey if further action is recommended prior to her upcoming visit on 9/18. Pt aware I will follow up with recommendations. Pt voiced understanding and thanks.

## 2016-01-26 NOTE — Telephone Encounter (Signed)
New Message  Pt c/o medication issue:  1. Name of Medication: Atorvastatin  2. How are you currently taking this medication (dosage and times per day)? 40 mg, once daily  3. Are you having a reaction (difficulty breathing--STAT)? No  4. What is your medication issue? Pt voiced it's concerning muscles aches in her arms.  Pt voiced it could be a side effect.

## 2016-01-29 NOTE — Telephone Encounter (Signed)
Continue to hold atorvastatin until that appointment so all her symptoms can resolve and we will discuss it at that time.

## 2016-01-30 NOTE — Telephone Encounter (Signed)
Advised patient

## 2016-02-10 DIAGNOSIS — Z1231 Encounter for screening mammogram for malignant neoplasm of breast: Secondary | ICD-10-CM | POA: Diagnosis not present

## 2016-02-10 DIAGNOSIS — Z803 Family history of malignant neoplasm of breast: Secondary | ICD-10-CM | POA: Diagnosis not present

## 2016-02-22 NOTE — Progress Notes (Signed)
Cardiology Office Note   Date:  02/24/2016   ID:  Heather Barnes, DOB 1934/06/06, MRN [id number]  PCP:  [name], MD  Cardiologist:   Skeet Latch, MD   Chief Complaint  Patient presents with  . Follow-up    1 Month. sob; when walking.       History of Present Illness: Heather Barnes is a 82 y.o. female with severe COPD and lung cancer in remission who presents for follow up.  She was initially evaluated 01/2015 due to coronary calcifications in the LM, LAD and RCA identified on cardiac CT.   Heather Barnes reported exertional dyspnea that had been getting progressively worse for years.  This was attributed to both her COPD and lung cancer and she declined stress testing.  Heather Barnes can't tolerate aspirin due to bleeding ulcers.  She was started on atorvastatin, which she has tolerated well. She was hospitalized with pneumonia 06/2015 and took a long time to recover.  Lately she has been feeling Barnes, but her energy levels are finally improving.  She also had myalgias and stopped both atorvastatin and Fosamax, as she read that both can cause these symptoms.  Her limb muscles were sore and she had chest wall tenderness to palpation.  This improved after stopping the medications.  At her last appointment Heather Barnes reported frequent palpitations.  She was referred for a 7 day event monitor 01/2016 that showed occasional PVCs with an episode of ventricular quadrigeminy.  She notes that while wearing the monitor her oxygen levels were dropping with minimal exertion.  Her oxygen requirements are starting to decrease now and she hasn't noted any palpitations lately.   Past Medical History:  Diagnosis Date  . Allergy    seasonal pollen  . Asthma   . Cataract    bilat.cataract/lens implant  . Colon adenomas   . COPD (chronic obstructive pulmonary disease) ([place])   . Goiter   . History of radiation therapy 05/24/11-06/02/11   R middle lobe lung/ 60 gray  . Hx of colonic polyps     . Hyperlipidemia   . Lung cancer, middle lobe ([place]) 04/26/2011  . Osteoporosis   . Palpitations 01/19/2016  . PVC (premature ventricular contraction) 02/24/2016  . Shortness of breath     Past Surgical History:  Procedure Laterality Date  . BIOPSY THYROID  2016  . COLONOSCOPY W/ BIOPSIES    . EYE SURGERY    . TONSILLECTOMY    . TOTAL ABDOMINAL HYSTERECTOMY       Current Outpatient Prescriptions  Medication Sig Dispense Refill  . alendronate (FOSAMAX) 70 MG tablet Take 70 mg by mouth every 7 (seven) days. Take with a full glass of water on an empty stomach. Saturday    . cetirizine (ZYRTEC) 10 MG tablet Take 10 mg by mouth daily.      . Cholecalciferol (VITAMIN D) 2000 UNITS CAPS Take 1 capsule by mouth 2 (two) times daily.     . fluticasone (FLONASE) 50 MCG/ACT nasal spray Place 2 sprays into the nose as needed for allergies or rhinitis.     . fluticasone furoate-vilanterol (BREO ELLIPTA) 100-25 MCG/INH AEPB Inhale 1 puff into the lungs daily. 60 each 11  . INCRUSE ELLIPTA 62.5 MCG/INH AEPB INHALE ONE PUFF ONCE DAILY 30 each 5  . Multiple Vitamins-Minerals (MULTIVITAMIN WITH MINERALS) tablet Take 1 tablet by mouth daily.      . Omega-3 Fatty Acids (FISH OIL) 1000 MG CAPS Take by mouth.    [name]  SINGULAIR 10 MG tablet Take 10 mg by mouth Daily.     [name] Spacer/Aero-Holding Chambers (AEROCHAMBER MV) inhaler Use as instructed 1 each 0   No current facility-administered medications for this visit.     Allergies:   Tiotropium bromide monohydrate; Codeine; and Asa [aspirin]    Social History:  The patient  reports that she quit smoking about 17 years ago. Her smoking use included Cigarettes. She has a 20.00 pack-year smoking history. She has never used smokeless tobacco. She reports that she does not drink alcohol or use drugs.   Family History:  The patient's family history includes Brain cancer in her sister; Breast cancer in her daughter; Cancer in her sister, sister, and sister;  Emphysema in her maternal uncle; Heart disease in her maternal grandfather and mother; Ovarian cancer in her sister; Rheum arthritis in her sister.    ROS:  Please see the history of present illness.   Otherwise, review of systems are positive for none.   All other systems are reviewed and negative.    PHYSICAL EXAM: VS:  BP (!) 146/69   Pulse 78   Ht '4\' 10"'$  (1.473 m)   Wt 122 lb 6.4 oz (55.5 kg)   BMI 25.58 kg/m  , BMI Body mass index is 25.58 kg/m. GENERAL:  Well appearing HEENT:  Pupils equal round and reactive, fundi not visualized, oral mucosa unremarkable NECK:  No jugular venous distention, waveform within normal limits, carotid upstroke brisk and symmetric, no bruits LYMPHATICS:  No cervical adenopathy LUNGS:  Clear to auscultation bilaterally HEART:  RRR.  PMI not displaced or sustained,S1 and S2 within normal limits, no S3, no S4, no clicks, no rubs, no murmurs ABD:  Flat, positive bowel sounds normal in frequency in pitch, no bruits, no rebound, no guarding, no midline pulsatile mass, no hepatomegaly, no splenomegaly EXT:  2 plus pulses throughout, no edema, no cyanosis no clubbing SKIN:  No rashes no nodules NEURO:  Cranial nerves II through XII grossly intact, motor grossly intact throughout PSYCH:  Cognitively intact, oriented to person place and time   EKG:  EKG is ordered today. The ekg ordered 01/19/16 demonstrates sinus rhythm at 82 bpm.    7 Day Event Monitor 01/21/16:  Quality: Fair.  Baseline artifact. Predominant rhythm: sinus  Average heart rate: 77 bpm Max heart rate: 110 bpm Min heart rate: 64 bpm  Occasional PVCs, with an episode of ventricular quadrigeminy.    Chest CT 12/20/14: Atherosclerosis of the thoracic aorta, mediastinal great vessels, LM, LAD and RCA.  Recent Labs: 06/13/2015: ALT 42 01/21/2016: BUN 10; Creat 0.60; Hemoglobin 13.2; Magnesium 2.0; Platelets 213; Potassium 4.4; Sodium 140; TSH 1.13    Lipid Panel    Component Value  Date/Time   CHOL 186 01/21/2016 1126   TRIG 97 01/21/2016 1126   HDL 86 01/21/2016 1126   CHOLHDL 2.2 01/21/2016 1126   VLDL 19 01/21/2016 1126   LDLCALC 81 01/21/2016 1126      Wt Readings from Last 3 Encounters:  02/23/16 122 lb 6.4 oz (55.5 kg)  01/19/16 121 lb 12.8 oz (55.2 kg)  11/11/15 122 lb 3.2 oz (55.4 kg)      ASSESSMENT AND PLAN:  # Asymptomatic coronary calcifications: Heather Barnes remains asymptomatic. Continue atorvastatin. She is unable to tolerate beta blockers or aspirin.    # PVCs: PVCs were noted on her event monitor.  This has improved after she increased her oxygen.  She has no exertional chest pain at this time  and isn't able to tolerate antiplatelets, so we will no pursue an ischemia evaluation.  Potassium was stable 01/2016.  # Hyperlipidemia: Heather Barnes had myalgais on atorvastatin.  She also stopped her Fosamax, as she thought it might be the cause.  I have asked her to restart her Fosamax and we will repeat her lipids in 6 months.  For now she will start fish oil '1000mg'$  daily.  We will discuss PCSK-9 inhibitors at follow up.  She is not interested for now.    Current medicines are reviewed at length with the patient today.  The patient does not have concerns regarding medicines.  The following changes have been made:  Start atorvastatin  Labs/ tests ordered today include: none  No orders of the defined types were placed in this encounter.    Disposition:   FU with Dr. Jonelle Sidle C. [place] in 6 months.   Signed, Skeet Latch, MD  02/24/2016 10:17 PM    [facility]

## 2016-02-23 ENCOUNTER — Encounter: Payer: Self-pay | Admitting: Cardiovascular Disease

## 2016-02-23 ENCOUNTER — Ambulatory Visit (INDEPENDENT_AMBULATORY_CARE_PROVIDER_SITE_OTHER): Payer: Medicare Other | Admitting: Cardiovascular Disease

## 2016-02-23 VITALS — BP 146/69 | HR 78 | Ht <= 58 in | Wt 122.4 lb

## 2016-02-23 DIAGNOSIS — I493 Ventricular premature depolarization: Secondary | ICD-10-CM | POA: Diagnosis not present

## 2016-02-23 DIAGNOSIS — I259 Chronic ischemic heart disease, unspecified: Secondary | ICD-10-CM | POA: Diagnosis not present

## 2016-02-23 DIAGNOSIS — E785 Hyperlipidemia, unspecified: Secondary | ICD-10-CM

## 2016-02-23 NOTE — Patient Instructions (Signed)
Medication Instructions:  START FISH OIL 1000 MG DAILY   Labwork: NONE  Testing/Procedures: NONE  Follow-Up: Your physician wants you to follow-up in: [place] will receive a reminder letter in the mail two months in advance. If you don't receive a letter, please call our office to schedule the follow-up appointment.  If you need a refill on your cardiac medications before your next appointment, please call your pharmacy.

## 2016-02-24 ENCOUNTER — Encounter: Payer: Self-pay | Admitting: Cardiovascular Disease

## 2016-02-24 DIAGNOSIS — I493 Ventricular premature depolarization: Secondary | ICD-10-CM

## 2016-02-24 HISTORY — DX: Ventricular premature depolarization: I49.3

## 2016-03-29 DIAGNOSIS — J309 Allergic rhinitis, unspecified: Secondary | ICD-10-CM | POA: Diagnosis not present

## 2016-03-29 DIAGNOSIS — J4520 Mild intermittent asthma, uncomplicated: Secondary | ICD-10-CM | POA: Diagnosis not present

## 2016-03-29 DIAGNOSIS — C3490 Malignant neoplasm of unspecified part of unspecified bronchus or lung: Secondary | ICD-10-CM | POA: Diagnosis not present

## 2016-03-29 DIAGNOSIS — K219 Gastro-esophageal reflux disease without esophagitis: Secondary | ICD-10-CM | POA: Diagnosis not present

## 2016-03-29 DIAGNOSIS — E782 Mixed hyperlipidemia: Secondary | ICD-10-CM | POA: Diagnosis not present

## 2016-03-29 DIAGNOSIS — J9611 Chronic respiratory failure with hypoxia: Secondary | ICD-10-CM | POA: Diagnosis not present

## 2016-03-29 DIAGNOSIS — M810 Age-related osteoporosis without current pathological fracture: Secondary | ICD-10-CM | POA: Diagnosis not present

## 2016-03-29 DIAGNOSIS — R079 Chest pain, unspecified: Secondary | ICD-10-CM | POA: Diagnosis not present

## 2016-03-29 DIAGNOSIS — Z23 Encounter for immunization: Secondary | ICD-10-CM | POA: Diagnosis not present

## 2016-03-29 DIAGNOSIS — E049 Nontoxic goiter, unspecified: Secondary | ICD-10-CM | POA: Diagnosis not present

## 2016-03-31 ENCOUNTER — Inpatient Hospital Stay (HOSPITAL_COMMUNITY)
Admission: EM | Admit: 2016-03-31 | Discharge: 2016-04-06 | DRG: 193 | Disposition: A | Discharge status: Home / Self Care | Payer: Medicare Other | Attending: Internal Medicine | Admitting: Internal Medicine

## 2016-03-31 ENCOUNTER — Encounter (HOSPITAL_COMMUNITY): Payer: Self-pay

## 2016-03-31 ENCOUNTER — Emergency Department (HOSPITAL_COMMUNITY): Payer: Medicare Other

## 2016-03-31 DIAGNOSIS — J9600 Acute respiratory failure, unspecified whether with hypoxia or hypercapnia: Secondary | ICD-10-CM | POA: Diagnosis present

## 2016-03-31 DIAGNOSIS — J9621 Acute and chronic respiratory failure with hypoxia: Secondary | ICD-10-CM | POA: Diagnosis present

## 2016-03-31 DIAGNOSIS — J441 Chronic obstructive pulmonary disease with (acute) exacerbation: Secondary | ICD-10-CM | POA: Diagnosis present

## 2016-03-31 DIAGNOSIS — R079 Chest pain, unspecified: Secondary | ICD-10-CM

## 2016-03-31 DIAGNOSIS — I959 Hypotension, unspecified: Secondary | ICD-10-CM | POA: Diagnosis not present

## 2016-03-31 DIAGNOSIS — Z9981 Dependence on supplemental oxygen: Secondary | ICD-10-CM | POA: Diagnosis not present

## 2016-03-31 DIAGNOSIS — E785 Hyperlipidemia, unspecified: Secondary | ICD-10-CM | POA: Diagnosis present

## 2016-03-31 DIAGNOSIS — Z87891 Personal history of nicotine dependence: Secondary | ICD-10-CM

## 2016-03-31 DIAGNOSIS — E039 Hypothyroidism, unspecified: Secondary | ICD-10-CM | POA: Diagnosis present

## 2016-03-31 DIAGNOSIS — Z8041 Family history of malignant neoplasm of ovary: Secondary | ICD-10-CM

## 2016-03-31 DIAGNOSIS — E860 Dehydration: Secondary | ICD-10-CM | POA: Diagnosis not present

## 2016-03-31 DIAGNOSIS — Z7951 Long term (current) use of inhaled steroids: Secondary | ICD-10-CM

## 2016-03-31 DIAGNOSIS — J9801 Acute bronchospasm: Secondary | ICD-10-CM | POA: Diagnosis present

## 2016-03-31 DIAGNOSIS — Z803 Family history of malignant neoplasm of breast: Secondary | ICD-10-CM

## 2016-03-31 DIAGNOSIS — Z9842 Cataract extraction status, left eye: Secondary | ICD-10-CM

## 2016-03-31 DIAGNOSIS — Z9841 Cataract extraction status, right eye: Secondary | ICD-10-CM

## 2016-03-31 DIAGNOSIS — C342 Malignant neoplasm of middle lobe, bronchus or lung: Secondary | ICD-10-CM | POA: Diagnosis present

## 2016-03-31 DIAGNOSIS — J181 Lobar pneumonia, unspecified organism: Secondary | ICD-10-CM | POA: Diagnosis not present

## 2016-03-31 DIAGNOSIS — R071 Chest pain on breathing: Secondary | ICD-10-CM | POA: Diagnosis not present

## 2016-03-31 DIAGNOSIS — J440 Chronic obstructive pulmonary disease with acute lower respiratory infection: Secondary | ICD-10-CM | POA: Diagnosis not present

## 2016-03-31 DIAGNOSIS — Z8261 Family history of arthritis: Secondary | ICD-10-CM

## 2016-03-31 DIAGNOSIS — J189 Pneumonia, unspecified organism: Secondary | ICD-10-CM | POA: Diagnosis not present

## 2016-03-31 DIAGNOSIS — Z923 Personal history of irradiation: Secondary | ICD-10-CM

## 2016-03-31 DIAGNOSIS — Z808 Family history of malignant neoplasm of other organs or systems: Secondary | ICD-10-CM

## 2016-03-31 DIAGNOSIS — J9620 Acute and chronic respiratory failure, unspecified whether with hypoxia or hypercapnia: Secondary | ICD-10-CM | POA: Diagnosis present

## 2016-03-31 DIAGNOSIS — E049 Nontoxic goiter, unspecified: Secondary | ICD-10-CM | POA: Diagnosis present

## 2016-03-31 DIAGNOSIS — R0602 Shortness of breath: Secondary | ICD-10-CM

## 2016-03-31 DIAGNOSIS — Z85118 Personal history of other malignant neoplasm of bronchus and lung: Secondary | ICD-10-CM

## 2016-03-31 DIAGNOSIS — D649 Anemia, unspecified: Secondary | ICD-10-CM | POA: Diagnosis present

## 2016-03-31 DIAGNOSIS — Z8249 Family history of ischemic heart disease and other diseases of the circulatory system: Secondary | ICD-10-CM

## 2016-03-31 DIAGNOSIS — R000 Tachycardia, unspecified: Secondary | ICD-10-CM | POA: Diagnosis present

## 2016-03-31 DIAGNOSIS — Z961 Presence of intraocular lens: Secondary | ICD-10-CM | POA: Diagnosis present

## 2016-03-31 LAB — I-STAT CHEM 8, ED
BUN: 9 mg/dL (ref 6–20)
CREATININE: 0.6 mg/dL (ref 0.44–1.00)
Calcium, Ion: 1.12 mmol/L — ABNORMAL LOW (ref 1.15–1.40)
Chloride: 100 mmol/L — ABNORMAL LOW (ref 101–111)
Glucose, Bld: 137 mg/dL — ABNORMAL HIGH (ref 65–99)
HEMATOCRIT: 39 % (ref 36.0–46.0)
HEMOGLOBIN: 13.3 g/dL (ref 12.0–15.0)
POTASSIUM: 3.8 mmol/L (ref 3.5–5.1)
Sodium: 138 mmol/L (ref 135–145)
TCO2: 31 mmol/L (ref 0–100)

## 2016-03-31 LAB — CBC WITH DIFFERENTIAL/PLATELET
BASOS ABS: 0 10*3/uL (ref 0.0–0.1)
BASOS PCT: 0 %
Eosinophils Absolute: 0.1 10*3/uL (ref 0.0–0.7)
Eosinophils Relative: 0 %
HEMATOCRIT: 38.7 % (ref 36.0–46.0)
Hemoglobin: 12.5 g/dL (ref 12.0–15.0)
LYMPHS PCT: 15 %
Lymphs Abs: 1.7 10*3/uL (ref 0.7–4.0)
MCH: 28.3 pg (ref 26.0–34.0)
MCHC: 32.3 g/dL (ref 30.0–36.0)
MCV: 87.8 fL (ref 78.0–100.0)
Monocytes Absolute: 0.9 10*3/uL (ref 0.1–1.0)
Monocytes Relative: 8 %
NEUTROS ABS: 8.7 10*3/uL — AB (ref 1.7–7.7)
NEUTROS PCT: 77 %
PLATELETS: 166 10*3/uL (ref 150–400)
RBC: 4.41 MIL/uL (ref 3.87–5.11)
RDW: 13.9 % (ref 11.5–15.5)
WBC: 11.4 10*3/uL — AB (ref 4.0–10.5)

## 2016-03-31 LAB — INFLUENZA PANEL BY PCR (TYPE A & B)
H1N1FLUPCR: NOT DETECTED
INFLAPCR: NEGATIVE
INFLBPCR: NEGATIVE

## 2016-03-31 LAB — TROPONIN I: Troponin I: <0.03 ng/mL (ref <0.03)

## 2016-03-31 MED ORDER — DEXTROSE 5 % IV SOLN
500.0000 mg | INTRAVENOUS | Status: DC
  Administered 2016-04-01 – 2016-04-02 (×2): 500 mg via INTRAVENOUS
  Filled 2016-03-31 (×2): qty 500

## 2016-03-31 MED ORDER — ACETAMINOPHEN 650 MG RE SUPP: 650.0000 mg | Freq: Four times a day (QID) | RECTAL | Status: DC | PRN

## 2016-03-31 MED ORDER — ACETAMINOPHEN 325 MG PO TABS: 650.0000 mg | ORAL_TABLET | ORAL | Status: DC | PRN

## 2016-03-31 MED ORDER — FENTANYL CITRATE (PF) 100 MCG/2ML IJ SOLN
25.0000 ug | INTRAMUSCULAR | Status: DC | PRN
  Administered 2016-03-31: 25 ug via INTRAVENOUS
  Filled 2016-03-31: qty 2

## 2016-03-31 MED ORDER — DEXTROSE 5 % IV SOLN
500.0000 mg | Freq: Once | INTRAVENOUS | Status: AC
  Administered 2016-03-31: 500 mg via INTRAVENOUS
  Filled 2016-03-31: qty 500

## 2016-03-31 MED ORDER — LEVALBUTEROL HCL 1.25 MG/0.5ML IN NEBU
1.2500 mg | INHALATION_SOLUTION | Freq: Three times a day (TID) | RESPIRATORY_TRACT | Status: DC
  Administered 2016-04-01 – 2016-04-03 (×8): 1.25 mg via RESPIRATORY_TRACT
  Filled 2016-03-31 (×8): qty 0.5

## 2016-03-31 MED ORDER — PREDNISONE 20 MG PO TABS
50.0000 mg | ORAL_TABLET | Freq: Every day | ORAL | Status: DC
  Administered 2016-04-01 – 2016-04-02 (×2): 50 mg via ORAL
  Filled 2016-03-31 (×2): qty 2

## 2016-03-31 MED ORDER — LORATADINE 10 MG PO TABS
10.0000 mg | ORAL_TABLET | Freq: Every day | ORAL | Status: DC
  Administered 2016-03-31 – 2016-04-06 (×7): 10 mg via ORAL
  Filled 2016-03-31 (×7): qty 1

## 2016-03-31 MED ORDER — GI COCKTAIL ~~LOC~~: 30.0000 mL | Freq: Four times a day (QID) | ORAL | Status: DC | PRN

## 2016-03-31 MED ORDER — GUAIFENESIN ER 600 MG PO TB12
1200.0000 mg | ORAL_TABLET | Freq: Two times a day (BID) | ORAL | Status: DC
  Administered 2016-03-31 – 2016-04-06 (×13): 1200 mg via ORAL
  Filled 2016-03-31 (×14): qty 2

## 2016-03-31 MED ORDER — ACETAMINOPHEN 325 MG PO TABS: 650.0000 mg | ORAL_TABLET | Freq: Four times a day (QID) | ORAL | Status: DC | PRN

## 2016-03-31 MED ORDER — ENOXAPARIN SODIUM 40 MG/0.4ML ~~LOC~~ SOLN
40.0000 mg | SUBCUTANEOUS | Status: DC
  Administered 2016-03-31 – 2016-04-05 (×6): 40 mg via SUBCUTANEOUS
  Filled 2016-03-31 (×5): qty 0.4

## 2016-03-31 MED ORDER — SODIUM CHLORIDE 0.9 % IV BOLUS (SEPSIS): 500.0000 mL | Freq: Once | INTRAVENOUS | Status: DC

## 2016-03-31 MED ORDER — LEVALBUTEROL HCL 1.25 MG/0.5ML IN NEBU
1.2500 mg | INHALATION_SOLUTION | Freq: Four times a day (QID) | RESPIRATORY_TRACT | Status: DC
  Administered 2016-03-31: 1.25 mg via RESPIRATORY_TRACT
  Filled 2016-03-31: qty 0.5

## 2016-03-31 MED ORDER — DEXTROSE 5 % IV SOLN
1.0000 g | INTRAVENOUS | Status: DC
  Administered 2016-04-01 – 2016-04-06 (×6): 1 g via INTRAVENOUS
  Filled 2016-03-31 (×6): qty 10

## 2016-03-31 MED ORDER — FENTANYL CITRATE (PF) 100 MCG/2ML IJ SOLN
25.0000 ug | Freq: Once | INTRAMUSCULAR | Status: AC
  Administered 2016-03-31: 25 ug via INTRAVENOUS
  Filled 2016-03-31: qty 2

## 2016-03-31 MED ORDER — UMECLIDINIUM BROMIDE 62.5 MCG/INH IN AEPB
1.0000 | INHALATION_SPRAY | Freq: Every day | RESPIRATORY_TRACT | Status: DC
  Administered 2016-04-01 – 2016-04-03 (×3): 1 via RESPIRATORY_TRACT
  Filled 2016-03-31: qty 7

## 2016-03-31 MED ORDER — ENOXAPARIN SODIUM 40 MG/0.4ML ~~LOC~~ SOLN: 40.0000 mg | SUBCUTANEOUS | Status: DC

## 2016-03-31 MED ORDER — PANTOPRAZOLE SODIUM 20 MG PO TBEC
20.0000 mg | DELAYED_RELEASE_TABLET | Freq: Two times a day (BID) | ORAL | Status: DC
  Administered 2016-03-31 – 2016-04-06 (×13): 20 mg via ORAL
  Filled 2016-03-31 (×13): qty 1

## 2016-03-31 MED ORDER — SODIUM CHLORIDE 0.9% FLUSH
3.0000 mL | Freq: Two times a day (BID) | INTRAVENOUS | Status: DC
  Administered 2016-03-31 – 2016-04-06 (×12): 3 mL via INTRAVENOUS

## 2016-03-31 MED ORDER — ACETAMINOPHEN 500 MG PO TABS
1000.0000 mg | ORAL_TABLET | Freq: Four times a day (QID) | ORAL | Status: DC | PRN
  Administered 2016-03-31 – 2016-04-02 (×3): 1000 mg via ORAL
  Filled 2016-03-31 (×3): qty 2

## 2016-03-31 MED ORDER — ONDANSETRON HCL 4 MG/2ML IJ SOLN: 4.0000 mg | Freq: Four times a day (QID) | INTRAMUSCULAR | Status: DC | PRN

## 2016-03-31 MED ORDER — ALBUTEROL SULFATE (2.5 MG/3ML) 0.083% IN NEBU
2.5000 mg | INHALATION_SOLUTION | RESPIRATORY_TRACT | Status: DC | PRN
  Administered 2016-04-02: 2.5 mg via RESPIRATORY_TRACT
  Filled 2016-03-31: qty 3

## 2016-03-31 MED ORDER — METHYLPREDNISOLONE SODIUM SUCC 125 MG IJ SOLR
125.0000 mg | Freq: Once | INTRAMUSCULAR | Status: AC
  Administered 2016-03-31: 125 mg via INTRAVENOUS
  Filled 2016-03-31: qty 2

## 2016-03-31 MED ORDER — LEVALBUTEROL HCL 1.25 MG/0.5ML IN NEBU
1.2500 mg | INHALATION_SOLUTION | Freq: Four times a day (QID) | RESPIRATORY_TRACT | Status: DC
  Administered 2016-03-31 (×2): 1.25 mg via RESPIRATORY_TRACT
  Filled 2016-03-31 (×2): qty 0.5

## 2016-03-31 MED ORDER — MONTELUKAST SODIUM 10 MG PO TABS
10.0000 mg | ORAL_TABLET | Freq: Every day | ORAL | Status: DC
  Administered 2016-03-31 – 2016-04-05 (×6): 10 mg via ORAL
  Filled 2016-03-31 (×6): qty 1

## 2016-03-31 MED ORDER — FLUTICASONE FUROATE-VILANTEROL 100-25 MCG/INH IN AEPB
1.0000 | INHALATION_SPRAY | Freq: Every day | RESPIRATORY_TRACT | Status: DC
  Administered 2016-04-01 – 2016-04-02 (×3): 1 via RESPIRATORY_TRACT
  Filled 2016-03-31: qty 28

## 2016-03-31 MED ORDER — ONDANSETRON HCL 4 MG PO TABS: 4.0000 mg | ORAL_TABLET | Freq: Four times a day (QID) | ORAL | Status: DC | PRN

## 2016-03-31 MED ORDER — DEXTROSE 5 % IV SOLN
1.0000 g | Freq: Once | INTRAVENOUS | Status: AC
  Administered 2016-03-31: 1 g via INTRAVENOUS
  Filled 2016-03-31: qty 10

## 2016-03-31 NOTE — ED Notes (Signed)
Attempted to call report

## 2016-03-31 NOTE — ED Triage Notes (Signed)
Per [facility]: Pt started having chest pain and shortness of breath starting on Monday. Pt is constantly on 2 L [place], however she bumped herself up to 4 L [place]. Pt was found to be 98% on 4 L [place]. PT was given 324 ASA and 5 mg albuterol with EMS.

## 2016-03-31 NOTE — H&P (Signed)
History and Physical    Heather Barnes JTT:[id number] DOB: 05-04-1934 DOA: 03/31/2016  PCP: [name], MD Patient coming from: Home  Chief Complaint: SOB CP  HPI: Heather Barnes is a 82 y.o. female with medical history significant of asthma/COPD, hypothyroidism/goiter, PVCs, HLD, RML cancer, presenting with approximately 2-3 day history of worsening shortness of breath and 1 day history of left lower chest pain. Associated symptoms include generalized weakness/fatigue, cough without productive sputum, increasing O2 requirement. Denies any palpitations, radiation of chest to her neck or arm, associated diaphoresis, fevers, chills, LOC, neck stiffness, vertigo, dizziness, dysuria, frequency, abdominal pain. Chest pain is worse with deep respirations or certain movements. Patient does not use a rescue inhaler. Patient has not tried anything for her symptoms. After administration of breathing treatments in the ED patient is feeling improved.   ED Coumultiple nebulizer treatments administered with improvement in condition. Started on ceftriaxone and azithromycin for pneumonia. Objective findings outlined below.  Review of Systems: As per HPI otherwise 10 point review of systems negative.   Ambulatory Status: No restrictions.  Past Medical History:  Diagnosis Date  . Allergy    seasonal pollen  . Asthma   . Cataract    bilat.cataract/lens implant  . Colon adenomas   . COPD (chronic obstructive pulmonary disease) ([place])   . Goiter   . History of radiation therapy 05/24/11-06/02/11   R middle lobe lung/ 60 gray  . Hx of colonic polyps   . Hyperlipidemia   . Lung cancer, middle lobe ([place]) 04/26/2011  . Osteoporosis   . Palpitations 01/19/2016  . PVC (premature ventricular contraction) 02/24/2016  . Shortness of breath     Past Surgical History:  Procedure Laterality Date  . BIOPSY THYROID  2016  . COLONOSCOPY W/ BIOPSIES    . EYE SURGERY    . TONSILLECTOMY    . TOTAL  ABDOMINAL HYSTERECTOMY      Social History   Social History  . Marital status: Widowed    Spouse name: N/A  . Number of children: N/A  . Years of education: N/A   Occupational History  . retired    Social History Main Topics  . Smoking status: Former Smoker    Packs/day: 0.50    Years: 40.00    Types: Cigarettes    Quit date: 06/07/1998  . Smokeless tobacco: Never Used  . Alcohol use No  . Drug use: No  . Sexual activity: Not on file   Other Topics Concern  . Not on file   Social History Narrative   On 03/17/11: expressed concerns about harmful effects of XRT based on dtr;s experience with the same with breast cancer    Allergies  Allergen Reactions  . Tiotropium Bromide Monohydrate Hives, Shortness Of Breath and Rash    Spiriva   . Codeine Other (See Comments)    Hyperactivity,crying  . Fosamax [Alendronate] Other (See Comments)    Muscle aches  . Asa [Aspirin] Other (See Comments)    Bleeding -ulcers  . Latex Rash    Family History  Problem Relation Age of Onset  . Emphysema Maternal Uncle   . Heart disease Mother   . Heart disease Maternal Grandfather   . Rheum arthritis Sister   . Cancer Sister   . Cancer Sister     throat cancer  . Brain cancer Sister   . Cancer Sister   . Ovarian cancer Sister   . Breast cancer Daughter     Prior to Admission medications  Medication Sig Start Date End Date Taking? Authorizing Provider  alendronate (FOSAMAX) 70 MG tablet Take 70 mg by mouth every 7 (seven) days. Take with a full glass of water on an empty stomach. Saturday    Historical Provider, MD  cetirizine (ZYRTEC) 10 MG tablet Take 10 mg by mouth daily.      Historical Provider, MD  Cholecalciferol (VITAMIN D) 2000 UNITS CAPS Take 1 capsule by mouth 2 (two) times daily.     Historical Provider, MD  fluticasone (FLONASE) 50 MCG/ACT nasal spray Place 2 sprays into the nose as needed for allergies or rhinitis.     Historical Provider, MD  fluticasone  furoate-vilanterol (BREO ELLIPTA) 100-25 MCG/INH AEPB Inhale 1 puff into the lungs daily. 08/12/15   Brand Males, MD  INCRUSE ELLIPTA 62.5 MCG/INH AEPB INHALE ONE PUFF ONCE DAILY 01/02/16   Brand Males, MD  Multiple Vitamins-Minerals (MULTIVITAMIN WITH MINERALS) tablet Take 1 tablet by mouth daily.      Historical Provider, MD  Omega-3 Fatty Acids (FISH OIL) 1000 MG CAPS Take by mouth.    Historical Provider, MD  SINGULAIR 10 MG tablet Take 10 mg by mouth Daily.  12/29/10   Historical Provider, MD  Spacer/Aero-Holding Chambers (AEROCHAMBER MV) inhaler Use as instructed 04/29/15   Brand Males, MD    Physical Exam: Vitals:   03/31/16 0900 03/31/16 0915 03/31/16 0930 03/31/16 0945  BP: (!) 121/50 116/59 131/55 (!) 117/45  Pulse: 94 91 93 89  Resp: '20 21 25 18  '$ Temp:      TempSrc:      SpO2: 96% 96% 95% 96%  Weight:      Height:         Gen: Appears to be in mild distress, resting comfortably in bed.  Eyes:  PERRL, EOMI, normal lids, iris ENT:  grossly normal hearing, lips & tongue, mmm Neck:  no LAD, masses or thyromegaly Cardiovascular: Regular rate and rhythm, preop 6 systolic murmur, trace bilateral lower external edema Res: Increased effort, on 4 L nasal cannula, wheezing throughout, decreased air movement. Abdomen:  soft, ntnd, NABS Skin:  no rash or induration seen on limited exam Musculoskeletal:  grossly normal tone BUE/BLE, good ROM, no bony abnormality Psychiatric:  grossly normal mood and affect, speech fluent and appropriate, AOx3 Neurologic:  CN 2-12 grossly intact, moves all extremities in coordinated fashion, sensation intact  Labs on Admission: I have personally reviewed following labs and imaging studies  CBC:  Recent Labs Lab 03/31/16 0747 03/31/16 0758  WBC 11.4*  --   NEUTROABS 8.7*  --   HGB 12.5 13.3  HCT 38.7 39.0  MCV 87.8  --   PLT 166  --    Basic Metabolic Panel:  Recent Labs Lab 03/31/16 0758  NA 138  K 3.8  CL 100*  GLUCOSE  137*  BUN 9  CREATININE 0.60   GFR: Estimated Creatinine Clearance: 41 mL/min (by C-G formula based on SCr of 0.6 mg/dL). Liver Function Tests: No results for input(s): AST, ALT, ALKPHOS, BILITOT, PROT, ALBUMIN in the last 168 hours. No results for input(s): LIPASE, AMYLASE in the last 168 hours. No results for input(s): AMMONIA in the last 168 hours. Coagulation Profile: No results for input(s): INR, PROTIME in the last 168 hours. Cardiac Enzymes:  Recent Labs Lab 03/31/16 0747  TROPONINI <0.03   BNP (last 3 results) No results for input(s): PROBNP in the last 8760 hours. HbA1C: No results for input(s): HGBA1C in the last 72 hours. CBG: No  results for input(s): GLUCAP in the last 168 hours. Lipid Profile: No results for input(s): CHOL, HDL, LDLCALC, TRIG, CHOLHDL, LDLDIRECT in the last 72 hours. Thyroid Function Tests: No results for input(s): TSH, T4TOTAL, FREET4, T3FREE, THYROIDAB in the last 72 hours. Anemia Panel: No results for input(s): VITAMINB12, FOLATE, FERRITIN, TIBC, IRON, RETICCTPCT in the last 72 hours. Urine analysis:    Component Value Date/Time   COLORURINE YELLOW 06/11/2015 1308   APPEARANCEUR CLOUDY (A) 06/11/2015 1308   LABSPEC 1.018 06/11/2015 1308   PHURINE 6.0 06/11/2015 1308   GLUCOSEU >1000 (A) 06/11/2015 1308   HGBUR TRACE (A) 06/11/2015 1308   BILIRUBINUR NEGATIVE 06/11/2015 1308   KETONESUR NEGATIVE 06/11/2015 1308   PROTEINUR NEGATIVE 06/11/2015 1308   NITRITE NEGATIVE 06/11/2015 1308   LEUKOCYTESUR SMALL (A) 06/11/2015 1308    Creatinine Clearance: Estimated Creatinine Clearance: 41 mL/min (by C-G formula based on SCr of 0.6 mg/dL).  Sepsis Labs: '@LABRCNTIP'$ (procalcitonin:4,lacticidven:4) )No results found for this or any previous visit (from the past 240 hour(s)).   Radiological Exams on Admission: Dg Chest 2 View  Result Date: 03/31/2016 CLINICAL DATA:  Left lower chest pain and shortness of breath EXAM: CHEST  2 VIEW  COMPARISON:  06/26/2015 chest radiograph. FINDINGS: Stable cardiomediastinal silhouette with normal heart size. No pneumothorax. Trace left pleural effusion, new. No right pleural effusion. Hyperinflated lungs. Emphysema. Mild hazy opacity in the peripheral mid to upper left lung is new. Additional mild patchy opacity at the left lung base appears new. Linear opacities at both lung bases appear stable consistent with scarring. No pulmonary edema. Stable mild chronic compression deformity of a mid to lower thoracic vertebral body. IMPRESSION: 1. New mild hazy opacities in the peripheral mid to upper left lung and at the left lung base, which could represent a pneumonia. Recommend follow-up PA and lateral post treatment chest radiographs in 4-6 weeks. 2. New trace left pleural effusion. 3. Emphysema and hyperinflated lungs, suggesting COPD. 4. Stable bibasilar scarring. Electronically Signed   By: Ilona Sorrel M.D.   On: 03/31/2016 08:41    EKG: Independently reviewed. Sinus, PVCs, no ACS  Assessment/Plan Active Problems:   Lung cancer, middle lobe (HCC)   COPD exacerbation (HCC)   Acute on chronic respiratory failure (HCC)   Acute respiratory failure (HCC)   CAP (community acquired pneumonia)   Chest pain   Tachycardia   Acute hypoxic respiratory failure: Likely multifactorial but predominantly due to COPD exacerbation with left upper and lower lobe pneumonias. Mild cytosis with left shift, tachypnea, afebrile with patient requiring 4 L nasal cannula (baseline 2 L w/ O2 sats in mid 80s at home priorrival). Pulmonary history significant for advanced COPD followed by Dr. Chase Caller and right middle lobe cancer stage I status post SB RT with resolution. Last CT performed July 2017 without evidence of recurrence.  - Telemetry observation - Solu-Medrol 1251 then prednisone 50 daily - Xopenex/albuterol - Into new Ellipta, Spiriva -Supplemental O2, wean as able - Ceftriaxone, azithromycin - COPD and  pneumonia or sets utilized - Dr. Chase Caller notified of patient's admission per patient's request  Chest pain: Likely secondary to pleuritic and diaphragmatic irritation from infection. EKG reassuring and troponin negative. Associated with respirations and movement. - Treatment plan as above - Telemetry - Cycle troponins - EKG in a.m. - Pain management - Chest pain order set utilized  Tachycardia and hypotension: Likely secondary to acute stress, hypo-bulimia/dehydration, and medication induced from breathing treatments. - NS bolus - Liver Xopenex over albuterol - Telemetry monitoring  DVT prophylaxis: lovenox  Code Status:  full  Family Communication:  son and daughter  Disposition Plan:  pending improvement and resolution of chest pain.  Consults called:  none  Admission status:  telemetry, observation    MERRELL, DAVID J MD [facility]  If 7PM-7AM, please contact night-coverage [email or web address] Password [email or web address]  03/31/2016, 10:20 AM

## 2016-03-31 NOTE — ED Notes (Signed)
Dr. Merrell at bedside. 

## 2016-03-31 NOTE — ED Provider Notes (Signed)
[facility] DEPT Provider Note   CSN: [id number] Arrival date & time: 03/31/16  6160     History   Chief Complaint Chief Complaint  Patient presents with  . Shortness of Breath  . Chest Pain    HPI Heather Barnes is a 82 y.o. female.  HPI Patient presents with shortness of breath and left-sided chest pain. His had it for the last few days. Saw her primary care doctor yesterday and was given an antacid. Has had previous pain in the side presumed to be from her cholesterol medicines. States she's had some residual pain since. States she's been more short of breath with it because she can't take a deep breath. She is a chronic 2 L of oxygen and has bad COPD. States she has had to increase her oxygen up to 4 L to keep her saturations above 90. Also some pain in the left elbow.  Past Medical History:  Diagnosis Date  . Allergy    seasonal pollen  . Asthma   . Cataract    bilat.cataract/lens implant  . Colon adenomas   . COPD (chronic obstructive pulmonary disease) ([place])   . Goiter   . History of radiation therapy 05/24/11-06/02/11   R middle lobe lung/ 60 gray  . Hx of colonic polyps   . Hyperlipidemia   . Lung cancer, middle lobe ([place]) 04/26/2011  . Osteoporosis   . Palpitations 01/19/2016  . PVC (premature ventricular contraction) 02/24/2016  . Shortness of breath     Patient Active Problem List   Diagnosis Date Noted  . PVC (premature ventricular contraction) 02/24/2016  . Palpitations 01/19/2016  . Respiratory failure ([place]) 06/11/2015  . Acute on chronic respiratory failure ([place]) 06/11/2015  . Knee pain, right 07/02/2014  . Chronic respiratory failure with hypoxia ([place]) 07/02/2014  . COPD, very severe ([place]) 07/02/2014  . COPD exacerbation ([place]) 06/13/2013  . Right rib fracture 05/15/2012  . Fatigue 07/22/2011  . Oral thrush 07/22/2011  . Lung cancer, middle lobe ([place]) 04/26/2011  . COPD (chronic obstructive pulmonary disease) ([place]) 01/13/2011    Past  Surgical History:  Procedure Laterality Date  . BIOPSY THYROID  2016  . COLONOSCOPY W/ BIOPSIES    . EYE SURGERY    . TONSILLECTOMY    . TOTAL ABDOMINAL HYSTERECTOMY      OB History    No data available       Home Medications    Prior to Admission medications   Medication Sig Start Date End Date Taking? Authorizing Provider  alendronate (FOSAMAX) 70 MG tablet Take 70 mg by mouth every 7 (seven) days. Take with a full glass of water on an empty stomach. Saturday    Historical Provider, MD  cetirizine (ZYRTEC) 10 MG tablet Take 10 mg by mouth daily.      Historical Provider, MD  Cholecalciferol (VITAMIN D) 2000 UNITS CAPS Take 1 capsule by mouth 2 (two) times daily.     Historical Provider, MD  fluticasone (FLONASE) 50 MCG/ACT nasal spray Place 2 sprays into the nose as needed for allergies or rhinitis.     Historical Provider, MD  fluticasone furoate-vilanterol (BREO ELLIPTA) 100-25 MCG/INH AEPB Inhale 1 puff into the lungs daily. 08/12/15   Brand Males, MD  INCRUSE ELLIPTA 62.5 MCG/INH AEPB INHALE ONE PUFF ONCE DAILY 01/02/16   Brand Males, MD  Multiple Vitamins-Minerals (MULTIVITAMIN WITH MINERALS) tablet Take 1 tablet by mouth daily.      Historical Provider, MD  Omega-3 Fatty Acids ([place])  1000 MG CAPS Take by mouth.    Historical Provider, MD  SINGULAIR 10 MG tablet Take 10 mg by mouth Daily.  12/29/10   Historical Provider, MD  Spacer/Aero-Holding Chambers (AEROCHAMBER MV) inhaler Use as instructed 04/29/15   Brand Males, MD    Family History Family History  Problem Relation Age of Onset  . Emphysema Maternal Uncle   . Heart disease Mother   . Heart disease Maternal Grandfather   . Rheum arthritis Sister   . Cancer Sister   . Cancer Sister     throat cancer  . Brain cancer Sister   . Cancer Sister   . Ovarian cancer Sister   . Breast cancer Daughter     Social History Social History  Substance Use Topics  . Smoking status: Former Smoker     Packs/day: 0.50    Years: 40.00    Types: Cigarettes    Quit date: 06/07/1998  . Smokeless tobacco: Never Used  . Alcohol use No     Allergies   Tiotropium bromide monohydrate; Codeine; and Asa [aspirin]   Review of Systems Review of Systems  Constitutional: Negative for fatigue.  HENT: Negative for congestion.   Respiratory: Positive for shortness of breath. Negative for cough.   Cardiovascular: Negative for chest pain.  Gastrointestinal: Negative for abdominal pain.  Genitourinary: Negative for flank pain.  Musculoskeletal: Negative for gait problem.  Skin: Negative for wound.  Neurological: Negative for headaches.  Hematological: Negative for adenopathy.     Physical Exam Updated Vital Signs BP (!) 121/50   Pulse 94   Temp 98.1 F (36.7 C) (Oral)   Resp 20   Ht '4\' 10"'$  (1.473 m)   Wt 120 lb (54.4 kg)   SpO2 96%   BMI 25.08 kg/m   Physical Exam  Constitutional: She appears well-developed.  HENT:  Head: Atraumatic.  Neck: Neck supple.  Cardiovascular: Normal rate.   Pulmonary/Chest: She has no wheezes. She exhibits tenderness.  Personal breathing with some dyspnea. Difficult with long sentences. Tenderness to left anterior lower chest wall.  Abdominal: Soft. There is no tenderness.  Neurological: She is alert.  Skin: Skin is warm. Capillary refill takes less than 2 seconds.  Psychiatric: She has a normal mood and affect.     ED Treatments / Results  Labs (all labs ordered are listed, but only abnormal results are displayed) Labs Reviewed  CBC WITH DIFFERENTIAL/PLATELET - Abnormal; Notable for the following:       Result Value   WBC 11.4 (*)    Neutro Abs 8.7 (*)    All other components within normal limits  I-STAT CHEM 8, ED - Abnormal; Notable for the following:    Chloride 100 (*)    Glucose, Bld 137 (*)    Calcium, Ion 1.12 (*)    All other components within normal limits  TROPONIN I    EKG  EKG Interpretation  Date/Time:  Wednesday March 31 2016 07:38:44 EDT Ventricular Rate:  100 PR Interval:    QRS Duration: 74 QT Interval:  335 QTC Calculation: 432 R Axis:   67 Text Interpretation:  Sinus tachycardia Multiple ventricular premature complexes Anteroseptal infarct, age indeterminate Confirmed by Alvino Chapel  MD, Jadarian Mckay [phone]) on 03/31/2016 7:46:11 AM       Radiology Dg Chest 2 View  Result Date: 03/31/2016 CLINICAL DATA:  Left lower chest pain and shortness of breath EXAM: CHEST  2 VIEW COMPARISON:  06/26/2015 chest radiograph. FINDINGS: Stable cardiomediastinal silhouette with normal heart  size. No pneumothorax. Trace left pleural effusion, new. No right pleural effusion. Hyperinflated lungs. Emphysema. Mild hazy opacity in the peripheral mid to upper left lung is new. Additional mild patchy opacity at the left lung base appears new. Linear opacities at both lung bases appear stable consistent with scarring. No pulmonary edema. Stable mild chronic compression deformity of a mid to lower thoracic vertebral body. IMPRESSION: 1. New mild hazy opacities in the peripheral mid to upper left lung and at the left lung base, which could represent a pneumonia. Recommend follow-up PA and lateral post treatment chest radiographs in 4-6 weeks. 2. New trace left pleural effusion. 3. Emphysema and hyperinflated lungs, suggesting COPD. 4. Stable bibasilar scarring. Electronically Signed   By: Ilona Sorrel M.D.   On: 03/31/2016 08:41    Procedures Procedures (including critical care time)  Medications Ordered in ED Medications  cefTRIAXone (ROCEPHIN) 1 g in dextrose 5 % 50 mL IVPB (not administered)  azithromycin (ZITHROMAX) 500 mg in dextrose 5 % 250 mL IVPB (not administered)  fentaNYL (SUBLIMAZE) injection 25 mcg (25 mcg Intravenous Given 03/31/16 0756)     Initial Impression / Assessment and Plan / ED Course  I have reviewed the triage vital signs and the nursing notes.  Pertinent labs & imaging results that were available during  my care of the patient were reviewed by me and considered in my medical decision making (see chart for details).  Clinical Course    Patient resents with shortness of breath. Patient states she thinks it is could she cannot take a deep breath due to her lower chest pain. She has had a cough with some mild sputum production. States is not different than normal. Found to have possible pneumonia on x-ray. Her home oxygen requirement has gone from 2 L 4 L due to desaturations below 90 on 2 and 3 L. X-ray shows possible pneumonia and will treat with antibiotics. No recent admission to hospital within the last 90 days. We'll treat as clinically acquired pneumonia. Admit to internal medicine.  Final Clinical Impressions(s) / ED Diagnoses   Final diagnoses:  Community acquired pneumonia of left lung, unspecified part of lung    New Prescriptions New Prescriptions   No medications on file     Davonna Belling, MD 03/31/16 [phone]

## 2016-03-31 NOTE — ED Triage Notes (Signed)
The pt states that she has "a sharp pain right under my left breast all the way through my back." She states that she saw her doctor on Monday who thought that the pain could be related to her lipitor, she also gave her previced. The pt states that it is painful to take a deep breath.

## 2016-03-31 NOTE — Care Management Note (Signed)
Case Management Note  Patient Details  Name: Heather Barnes MRN: [id number] Date of Birth: 04-May-1934  Subjective/Objective:                  82 yo Patient presents with shortness of breath and left-sided chest pain. From home.  Action/Plan: Follow for disposition needs. /Admit for observation; possible HH services at discharge.   Expected Discharge Date:  04/02/16               Expected Discharge Plan:  [place]  In-House Referral:  NA  Discharge planning Services  CM Consult  Post Acute Care Choice:    Choice offered to:     DME Arranged:    DME Agency:     HH Arranged:    HH Agency:     Status of Service:  In process, will continue to follow  If discussed at Long Length of Stay Meetings, dates discussed:    Additional Comments:  Fuller Mandril, RN 03/31/2016, 9:54 AM

## 2016-03-31 NOTE — ED Notes (Signed)
Patient transported to X-ray 

## 2016-04-01 DIAGNOSIS — Z8261 Family history of arthritis: Secondary | ICD-10-CM | POA: Diagnosis not present

## 2016-04-01 DIAGNOSIS — D649 Anemia, unspecified: Secondary | ICD-10-CM | POA: Diagnosis not present

## 2016-04-01 DIAGNOSIS — I959 Hypotension, unspecified: Secondary | ICD-10-CM | POA: Diagnosis not present

## 2016-04-01 DIAGNOSIS — J9621 Acute and chronic respiratory failure with hypoxia: Secondary | ICD-10-CM | POA: Diagnosis not present

## 2016-04-01 DIAGNOSIS — J189 Pneumonia, unspecified organism: Secondary | ICD-10-CM | POA: Diagnosis present

## 2016-04-01 DIAGNOSIS — J9601 Acute respiratory failure with hypoxia: Secondary | ICD-10-CM | POA: Diagnosis not present

## 2016-04-01 DIAGNOSIS — R000 Tachycardia, unspecified: Secondary | ICD-10-CM | POA: Diagnosis present

## 2016-04-01 DIAGNOSIS — E785 Hyperlipidemia, unspecified: Secondary | ICD-10-CM | POA: Diagnosis present

## 2016-04-01 DIAGNOSIS — Z7951 Long term (current) use of inhaled steroids: Secondary | ICD-10-CM | POA: Diagnosis not present

## 2016-04-01 DIAGNOSIS — J181 Lobar pneumonia, unspecified organism: Secondary | ICD-10-CM | POA: Diagnosis not present

## 2016-04-01 DIAGNOSIS — Z87891 Personal history of nicotine dependence: Secondary | ICD-10-CM | POA: Diagnosis not present

## 2016-04-01 DIAGNOSIS — Z961 Presence of intraocular lens: Secondary | ICD-10-CM | POA: Diagnosis present

## 2016-04-01 DIAGNOSIS — Z9842 Cataract extraction status, left eye: Secondary | ICD-10-CM | POA: Diagnosis not present

## 2016-04-01 DIAGNOSIS — E860 Dehydration: Secondary | ICD-10-CM | POA: Diagnosis present

## 2016-04-01 DIAGNOSIS — Z9841 Cataract extraction status, right eye: Secondary | ICD-10-CM | POA: Diagnosis not present

## 2016-04-01 DIAGNOSIS — J440 Chronic obstructive pulmonary disease with acute lower respiratory infection: Secondary | ICD-10-CM | POA: Diagnosis not present

## 2016-04-01 DIAGNOSIS — E049 Nontoxic goiter, unspecified: Secondary | ICD-10-CM | POA: Diagnosis present

## 2016-04-01 DIAGNOSIS — J9620 Acute and chronic respiratory failure, unspecified whether with hypoxia or hypercapnia: Secondary | ICD-10-CM | POA: Diagnosis not present

## 2016-04-01 DIAGNOSIS — Z923 Personal history of irradiation: Secondary | ICD-10-CM | POA: Diagnosis not present

## 2016-04-01 DIAGNOSIS — C342 Malignant neoplasm of middle lobe, bronchus or lung: Secondary | ICD-10-CM

## 2016-04-01 DIAGNOSIS — J9801 Acute bronchospasm: Secondary | ICD-10-CM | POA: Diagnosis present

## 2016-04-01 DIAGNOSIS — Z803 Family history of malignant neoplasm of breast: Secondary | ICD-10-CM | POA: Diagnosis not present

## 2016-04-01 DIAGNOSIS — R0602 Shortness of breath: Secondary | ICD-10-CM | POA: Diagnosis not present

## 2016-04-01 DIAGNOSIS — Z808 Family history of malignant neoplasm of other organs or systems: Secondary | ICD-10-CM | POA: Diagnosis not present

## 2016-04-01 DIAGNOSIS — J441 Chronic obstructive pulmonary disease with (acute) exacerbation: Secondary | ICD-10-CM | POA: Diagnosis present

## 2016-04-01 DIAGNOSIS — Z8249 Family history of ischemic heart disease and other diseases of the circulatory system: Secondary | ICD-10-CM | POA: Diagnosis not present

## 2016-04-01 DIAGNOSIS — Z9981 Dependence on supplemental oxygen: Secondary | ICD-10-CM | POA: Diagnosis not present

## 2016-04-01 DIAGNOSIS — Z8041 Family history of malignant neoplasm of ovary: Secondary | ICD-10-CM | POA: Diagnosis not present

## 2016-04-01 DIAGNOSIS — I200 Unstable angina: Secondary | ICD-10-CM | POA: Diagnosis not present

## 2016-04-01 DIAGNOSIS — E039 Hypothyroidism, unspecified: Secondary | ICD-10-CM | POA: Diagnosis not present

## 2016-04-01 LAB — BASIC METABOLIC PANEL
Anion gap: 9 (ref 5–15)
BUN: 12 mg/dL (ref 6–20)
CHLORIDE: 101 mmol/L (ref 101–111)
CO2: 30 mmol/L (ref 22–32)
Calcium: 9.5 mg/dL (ref 8.9–10.3)
Creatinine, Ser: 0.48 mg/dL (ref 0.44–1.00)
GFR calc non Af Amer: >60 mL/min (ref >60)
Glucose, Bld: 137 mg/dL — ABNORMAL HIGH (ref 65–99)
POTASSIUM: 4.5 mmol/L (ref 3.5–5.1)
SODIUM: 140 mmol/L (ref 135–145)

## 2016-04-01 LAB — CBC
HEMATOCRIT: 35.7 % — AB (ref 36.0–46.0)
Hemoglobin: 11.4 g/dL — ABNORMAL LOW (ref 12.0–15.0)
MCH: 27.9 pg (ref 26.0–34.0)
MCHC: 31.9 g/dL (ref 30.0–36.0)
MCV: 87.5 fL (ref 78.0–100.0)
Platelets: 212 10*3/uL (ref 150–400)
RBC: 4.08 MIL/uL (ref 3.87–5.11)
RDW: 14 % (ref 11.5–15.5)
WBC: 11.6 10*3/uL — AB (ref 4.0–10.5)

## 2016-04-01 LAB — HIV ANTIBODY (ROUTINE TESTING W REFLEX): HIV SCREEN 4TH GENERATION: NONREACTIVE

## 2016-04-01 NOTE — Progress Notes (Signed)
[facility] PROGRESS NOTE  Heather Barnes WUJ:[id number] DOB: [birth year] DOA: 03/31/2016   PCP: [name], MD     Assessment/Plan: Active Problems:   Lung cancer, middle lobe (HCC)   COPD exacerbation (HCC)   Acute on chronic respiratory failure ([place])   Acute respiratory failure ([place])   CAP (community acquired pneumonia)   Chest pain   Tachycardia   82 y.o. female with medical history significant of asthma/COPD, hypothyroidism/goiter, PVCs, HLD, RML cancer, presenting with approximately 2-3 day history of worsening shortness of breath and 1 day history of left lower chest pain. Associated symptoms include generalized weakness/fatigue, cough without productive sputum, increasing O2 requirement. Denies any palpitations, radiation of chest to her neck or arm, associated diaphoresis, fevers, chills, LOC, neck stiffness, vertigo, dizziness, dysuria, frequency, abdominal pain. Chest pain is worse with deep respirations or certain movements. Patient does not use a rescue inhaler. Patient has not tried anything for her symptoms. After administration of breathing treatments in the ED patient is feeling improved.  Assessment and plan  Acute hypoxic respiratory failure: Likely multifactorial but predominantly due to COPD exacerbation with left upper and lower lobe pneumonias. Mild cytosis with left shift, tachypnea, afebrile with patient requiring 4 L nasal cannula (baseline 2 L w/ O2 sats in mid 80s at home priorrival). Pulmonary history significant for advanced COPD followed by Dr. Chase Caller and right middle lobe cancer stage I status post SB RT with resolution. Last CT performed July 2017 without evidence of recurrence.  - Telemetry observation - Solu-Medrol 1251 then prednisone 50 daily - Xopenex/albuterol - Into new Ellipta, Spiriva -Supplemental O2, wean as able - Ceftriaxone, azithromycin, day #2 - COPD and pneumonia or sets utilized - Dr. Chase Caller notified of  patient's admission per patient's request   Chest pain: Likely secondary to pleuritic and diaphragmatic irritation from infection. EKG reassuring and troponin negative. Associated with respirations and movement. - Treatment plan as above - Telemetry - Cycle troponins-negative  - EKG in a.m. - Pain management - Chest pain order set utilized  Tachycardia and hypotension: Likely secondary to acute stress, hypo-bulimia/dehydration, and medication induced from breathing treatments. - - Liver Xopenex over albuterol - Telemetry monitoring    DVT prophylaxsis lovenox   Code Status:  Full code    Family Communication: Discussed in detail with the patient, all imaging results, lab results explained to the patient   Disposition Plan:  Home in 2-3 days     *  Consultants:  None   Procedures:  None   Antibiotics: Anti-infectives    Start     Dose/Rate Route Frequency Ordered Stop   04/01/16 1000  cefTRIAXone (ROCEPHIN) 1 g in dextrose 5 % 50 mL IVPB     1 g 100 mL/hr over 30 Minutes Intravenous Every 24 hours 03/31/16 0925 04/08/16 0959   04/01/16 1000  azithromycin (ZITHROMAX) 500 mg in dextrose 5 % 250 mL IVPB     500 mg 250 mL/hr over 60 Minutes Intravenous Every 24 hours 03/31/16 0925 04/08/16 0959   03/31/16 0900  cefTRIAXone (ROCEPHIN) 1 g in dextrose 5 % 50 mL IVPB     1 g 100 mL/hr over 30 Minutes Intravenous  Once 03/31/16 0856 03/31/16 1019   03/31/16 0900  azithromycin (ZITHROMAX) 500 mg in dextrose 5 % 250 mL IVPB     500 mg 250 mL/hr over 60 Minutes Intravenous  Once 03/31/16 0856 03/31/16 1107         HPI/Subjective: Still tachypnic  and sob  Objective: Vitals:   04/01/16 0428 04/01/16 0800 04/01/16 0940 04/01/16 1200  BP: (!) 114/55 (!) 140/53  (!) 125/47  Pulse:  93  98  Resp:    (!) 24  Temp:  98.1 F (36.7 C)  97.5 F (36.4 C)  TempSrc:  Oral  Oral  SpO2:  97% 98% 96%  Weight:      Height:        Intake/Output Summary (Last 24 hours) at  04/01/16 1355 Last data filed at 04/01/16 1216  Gross per 24 hour  Intake              620 ml  Output             2425 ml  Net            -1805 ml    Exam:  Examination:  General exam: Appears calm and comfortable  Respiratory system: Clear to auscultation. Respiratory effort normal. Cardiovascular system: S1 & S2 heard, RRR. No JVD, murmurs, rubs, gallops or clicks. No pedal edema. Gastrointestinal system: Abdomen is nondistended, soft and nontender. No organomegaly or masses felt. Normal bowel sounds heard. Central nervous system: Alert and oriented. No focal neurological deficits. Extremities: Symmetric 5 x 5 power. Skin: No rashes, lesions or ulcers Psychiatry: Judgement and insight appear normal. Mood & affect appropriate.     Data Reviewed: I have personally reviewed following labs and imaging studies  Micro Results Recent Results (from the past 240 hour(s))  Culture, blood (routine x 2) Call MD if unable to obtain prior to antibiotics being given     Status: None (Preliminary result)   Collection Time: 03/31/16  9:25 AM  Result Value Ref Range Status   Specimen Description BLOOD LEFT FOREARM  Final   Special Requests BOTTLES DRAWN AEROBIC AND ANAEROBIC 5CC  Final   Culture NO GROWTH < 12 HOURS  Final   Report Status PENDING  Incomplete  Culture, blood (routine x 2) Call MD if unable to obtain prior to antibiotics being given     Status: None (Preliminary result)   Collection Time: 03/31/16  9:34 AM  Result Value Ref Range Status   Specimen Description BLOOD LEFT HAND  Final   Special Requests BOTTLES DRAWN AEROBIC AND ANAEROBIC 5CC  Final   Culture NO GROWTH < 12 HOURS  Final   Report Status PENDING  Incomplete    Radiology Reports Dg Chest 2 View  Result Date: 03/31/2016 CLINICAL DATA:  Left lower chest pain and shortness of breath EXAM: CHEST  2 VIEW COMPARISON:  06/26/2015 chest radiograph. FINDINGS: Stable cardiomediastinal silhouette with normal heart size.  No pneumothorax. Trace left pleural effusion, new. No right pleural effusion. Hyperinflated lungs. Emphysema. Mild hazy opacity in the peripheral mid to upper left lung is new. Additional mild patchy opacity at the left lung base appears new. Linear opacities at both lung bases appear stable consistent with scarring. No pulmonary edema. Stable mild chronic compression deformity of a mid to lower thoracic vertebral body. IMPRESSION: 1. New mild hazy opacities in the peripheral mid to upper left lung and at the left lung base, which could represent a pneumonia. Recommend follow-up PA and lateral post treatment chest radiographs in 4-6 weeks. 2. New trace left pleural effusion. 3. Emphysema and hyperinflated lungs, suggesting COPD. 4. Stable bibasilar scarring. Electronically Signed   By: Ilona Sorrel M.D.   On: 03/31/2016 08:41     CBC  Recent Labs Lab 03/31/16 0747 03/31/16 0758  04/01/16 0254  WBC 11.4*  --  11.6*  HGB 12.5 13.3 11.4*  HCT 38.7 39.0 35.7*  PLT 166  --  212  MCV 87.8  --  87.5  MCH 28.3  --  27.9  MCHC 32.3  --  31.9  RDW 13.9  --  14.0  LYMPHSABS 1.7  --   --   MONOABS 0.9  --   --   EOSABS 0.1  --   --   BASOSABS 0.0  --   --     Chemistries   Recent Labs Lab 03/31/16 0758 04/01/16 0254  NA 138 140  K 3.8 4.5  CL 100* 101  CO2  --  30  GLUCOSE 137* 137*  BUN 9 12  CREATININE 0.60 0.48  CALCIUM  --  9.5   ------------------------------------------------------------------------------------------------------------------ estimated creatinine clearance is 41.1 mL/min (by C-G formula based on SCr of 0.48 mg/dL). ------------------------------------------------------------------------------------------------------------------ No results for input(s): HGBA1C in the last 72 hours. ------------------------------------------------------------------------------------------------------------------ No results for input(s): CHOL, HDL, LDLCALC, TRIG, CHOLHDL, LDLDIRECT in  the last 72 hours. ------------------------------------------------------------------------------------------------------------------ No results for input(s): TSH, T4TOTAL, T3FREE, THYROIDAB in the last 72 hours.  Invalid input(s): FREET3 ------------------------------------------------------------------------------------------------------------------ No results for input(s): VITAMINB12, FOLATE, FERRITIN, TIBC, IRON, RETICCTPCT in the last 72 hours.  Coagulation profile No results for input(s): INR, PROTIME in the last 168 hours.  No results for input(s): DDIMER in the last 72 hours.  Cardiac Enzymes  Recent Labs Lab 03/31/16 0747 03/31/16 0940 03/31/16 1302  TROPONINI <0.03 <0.03 <0.03   ------------------------------------------------------------------------------------------------------------------ Invalid input(s): POCBNP   CBG: No results for input(s): GLUCAP in the last 168 hours.     Studies: Dg Chest 2 View  Result Date: 03/31/2016 CLINICAL DATA:  Left lower chest pain and shortness of breath EXAM: CHEST  2 VIEW COMPARISON:  06/26/2015 chest radiograph. FINDINGS: Stable cardiomediastinal silhouette with normal heart size. No pneumothorax. Trace left pleural effusion, new. No right pleural effusion. Hyperinflated lungs. Emphysema. Mild hazy opacity in the peripheral mid to upper left lung is new. Additional mild patchy opacity at the left lung base appears new. Linear opacities at both lung bases appear stable consistent with scarring. No pulmonary edema. Stable mild chronic compression deformity of a mid to lower thoracic vertebral body. IMPRESSION: 1. New mild hazy opacities in the peripheral mid to upper left lung and at the left lung base, which could represent a pneumonia. Recommend follow-up PA and lateral post treatment chest radiographs in 4-6 weeks. 2. New trace left pleural effusion. 3. Emphysema and hyperinflated lungs, suggesting COPD. 4. Stable bibasilar  scarring. Electronically Signed   By: Ilona Sorrel M.D.   On: 03/31/2016 08:41      Lab Results  Component Value Date   HGBA1C 5.8 (H) 06/11/2015   Lab Results  Component Value Date   LDLCALC 81 01/21/2016   CREATININE 0.48 04/01/2016       Scheduled Meds: . azithromycin  500 mg Intravenous Q24H  . cefTRIAXone (ROCEPHIN)  IV  1 g Intravenous Q24H  . enoxaparin (LOVENOX) injection  40 mg Subcutaneous Q24H  . fluticasone furoate-vilanterol  1 puff Inhalation Daily  . guaiFENesin  1,200 mg Oral BID  . levalbuterol  1.25 mg Nebulization TID  . loratadine  10 mg Oral Daily  . montelukast  10 mg Oral QHS  . pantoprazole  20 mg Oral BID  . predniSONE  50 mg Oral Q breakfast  . sodium chloride flush  3 mL Intravenous Q12H  . umeclidinium bromide  1 puff Inhalation Daily   Continuous Infusions:    LOS: 0 days    Time spent: >30 MINS    [facility]  [facility] Pager [phone]. If 7PM-7AM, please contact night-coverage at [email or web address], password [facility] 04/01/2016, 1:55 PM  LOS: 0 days

## 2016-04-01 NOTE — Care Management Obs Status (Signed)
[place] NOTIFICATION   Patient Details  Name: Heather Barnes MRN: [id number] Date of Birth: 1933-09-01   Medicare Observation Status Notification Given:  Yes    [name], RN 04/01/2016, 4:13 PM

## 2016-04-01 NOTE — Plan of Care (Signed)
Problem: Education: Goal: Knowledge of [place] [facility] Education information/materials will improve Outcome: Progressing Patient aware of plan of care.  RN and patient discussed all medications administered thus far this shift.  Patient stated understanding.

## 2016-04-02 DIAGNOSIS — J189 Pneumonia, unspecified organism: Principal | ICD-10-CM

## 2016-04-02 DIAGNOSIS — J9601 Acute respiratory failure with hypoxia: Secondary | ICD-10-CM

## 2016-04-02 DIAGNOSIS — I200 Unstable angina: Secondary | ICD-10-CM

## 2016-04-02 MED ORDER — METHYLPREDNISOLONE SODIUM SUCC 125 MG IJ SOLR
60.0000 mg | Freq: Two times a day (BID) | INTRAMUSCULAR | Status: DC
  Administered 2016-04-02 – 2016-04-03 (×3): 60 mg via INTRAVENOUS
  Filled 2016-04-02 (×3): qty 2

## 2016-04-02 MED ORDER — FUROSEMIDE 10 MG/ML IJ SOLN
40.0000 mg | Freq: Once | INTRAMUSCULAR | Status: AC
  Administered 2016-04-02: 40 mg via INTRAVENOUS
  Filled 2016-04-02: qty 4

## 2016-04-02 MED ORDER — AZITHROMYCIN 250 MG PO TABS
500.0000 mg | ORAL_TABLET | Freq: Every day | ORAL | Status: DC
  Administered 2016-04-03 – 2016-04-06 (×4): 500 mg via ORAL
  Filled 2016-04-02 (×4): qty 2

## 2016-04-02 NOTE — Progress Notes (Addendum)
[facility] PROGRESS NOTE  Heather Barnes ZHG:[id number] DOB: 1934/05/08 DOA: 03/31/2016   PCP: [name], MD     Assessment/Plan: Active Problems:   Lung cancer, middle lobe (HCC)   COPD exacerbation (HCC)   Acute on chronic respiratory failure ([place])   Acute respiratory failure ([place])   CAP (community acquired pneumonia)   Chest pain   Tachycardia   82 y.o. female with medical history significant of asthma/COPD, hypothyroidism/goiter, PVCs, HLD, RML cancer, presenting with approximately 2-3 day history of worsening shortness of breath and 1 day history of left lower chest pain. Associated symptoms include generalized weakness/fatigue, cough without productive sputum, increasing O2 requirement. Denies any palpitations, radiation of chest to her neck or arm, associated diaphoresis, fevers, chills, LOC, neck stiffness, vertigo, dizziness, dysuria, frequency, abdominal pain. Chest pain is worse with deep respirations or certain movements. Patient does not use a rescue inhaler. Patient has not tried anything for her symptoms. After administration of breathing treatments in the ED patient is feeling improved.  Assessment and plan  Acute hypoxic respiratory failure: Likely multifactorial but predominantly due to COPD exacerbation with left upper and lower lobe pneumonias.  Continues to have tachypnea, afebrile with patient requiring 4 L nasal cannula (baseline 2 L w/ O2 sats in mid 80s at home priorrival). Pulmonary history significant for advanced COPD followed by Dr. Chase Caller and right middle lobe cancer stage I status post SB RT with resolution. Last CT performed July 2017 without evidence of recurrence.  Continue telemetry Increase steroids from prednisone to IV Solu-Medrol 60 mg IV every 12 - Xopenex/albuterol  Ellipta, Spiriva -Supplemental O2, wean as able - Ceftriaxone, azithromycin, day #3, influenza PCR, HIV test negative, urine antigen testing pending - COPD and  pneumonia or sets utilized - Dr. Chase Caller notified of patient's admission per patient's request   Chest pain: Likely secondary to pleuritic and diaphragmatic irritation from infection. EKG reassuring and troponin negative. Associated with respirations and movement. - Treatment plan as above - Telemetry  Cardiac enzymes negative  - EKG in a.m. - Pain management - Chest pain order set utilized  Tachycardia and hypotension: Likely secondary to acute stress, hypo-bulimia/dehydration, and medication induced from breathing treatments. - - Liver Xopenex over albuterol - Telemetry monitoring    DVT prophylaxsis lovenox   Code Status:  Full code    Family Communication: Discussed in detail with the patient, all imaging results, lab results explained to the patient   Disposition Plan:  Home in 2-3 days     *  Consultants:  None   Procedures:  None   Antibiotics: Anti-infectives    Start     Dose/Rate Route Frequency Ordered Stop   04/01/16 1000  cefTRIAXone (ROCEPHIN) 1 g in dextrose 5 % 50 mL IVPB     1 g 100 mL/hr over 30 Minutes Intravenous Every 24 hours 03/31/16 0925 04/08/16 0959   04/01/16 1000  azithromycin (ZITHROMAX) 500 mg in dextrose 5 % 250 mL IVPB     500 mg 250 mL/hr over 60 Minutes Intravenous Every 24 hours 03/31/16 0925 04/08/16 0959   03/31/16 0900  cefTRIAXone (ROCEPHIN) 1 g in dextrose 5 % 50 mL IVPB     1 g 100 mL/hr over 30 Minutes Intravenous  Once 03/31/16 0856 03/31/16 1019   03/31/16 0900  azithromycin (ZITHROMAX) 500 mg in dextrose 5 % 250 mL IVPB     500 mg 250 mL/hr over 60 Minutes Intravenous  Once 03/31/16 0856 03/31/16 1107  HPI/Subjective: Patient states that she had an episode of severe wheezing yesterday evening and early this morning and required breathing treatments  Objective: Vitals:   04/01/16 1946 04/01/16 2124 04/02/16 0500 04/02/16 0923  BP:   (!) 108/51   Pulse:   84   Resp:   18   Temp:  97.9 F (36.6 C)  98.2 F (36.8 C)   TempSrc:  Oral Oral   SpO2: 99%  99% 98%  Weight:   54.8 kg (120 lb 14.4 oz)   Height:        Intake/Output Summary (Last 24 hours) at 04/02/16 1203 Last data filed at 04/02/16 0606  Gross per 24 hour  Intake              720 ml  Output             2000 ml  Net            -1280 ml    Exam:  Examination:  General exam: Appears calm and comfortable  Respiratory system: Decreased air movement, tachypnea Cardiovascular system: S1 & S2 heard, RRR. No JVD, murmurs, rubs, gallops or clicks. No pedal edema. Gastrointestinal system: Abdomen is nondistended, soft and nontender. No organomegaly or masses felt. Normal bowel sounds heard. Central nervous system: Alert and oriented. No focal neurological deficits. Extremities: Symmetric 5 x 5 power. Skin: No rashes, lesions or ulcers Psychiatry: Judgement and insight appear normal. Mood & affect appropriate.     Data Reviewed: I have personally reviewed following labs and imaging studies  Micro Results Recent Results (from the past 240 hour(s))  Culture, blood (routine x 2) Call MD if unable to obtain prior to antibiotics being given     Status: None (Preliminary result)   Collection Time: 03/31/16  9:25 AM  Result Value Ref Range Status   Specimen Description BLOOD LEFT FOREARM  Final   Special Requests BOTTLES DRAWN AEROBIC AND ANAEROBIC 5CC  Final   Culture NO GROWTH 1 DAY  Final   Report Status PENDING  Incomplete  Culture, blood (routine x 2) Call MD if unable to obtain prior to antibiotics being given     Status: None (Preliminary result)   Collection Time: 03/31/16  9:34 AM  Result Value Ref Range Status   Specimen Description BLOOD LEFT HAND  Final   Special Requests BOTTLES DRAWN AEROBIC AND ANAEROBIC 5CC  Final   Culture NO GROWTH 1 DAY  Final   Report Status PENDING  Incomplete    Radiology Reports Dg Chest 2 View  Result Date: 03/31/2016 CLINICAL DATA:  Left lower chest pain and shortness of  breath EXAM: CHEST  2 VIEW COMPARISON:  06/26/2015 chest radiograph. FINDINGS: Stable cardiomediastinal silhouette with normal heart size. No pneumothorax. Trace left pleural effusion, new. No right pleural effusion. Hyperinflated lungs. Emphysema. Mild hazy opacity in the peripheral mid to upper left lung is new. Additional mild patchy opacity at the left lung base appears new. Linear opacities at both lung bases appear stable consistent with scarring. No pulmonary edema. Stable mild chronic compression deformity of a mid to lower thoracic vertebral body. IMPRESSION: 1. New mild hazy opacities in the peripheral mid to upper left lung and at the left lung base, which could represent a pneumonia. Recommend follow-up PA and lateral post treatment chest radiographs in 4-6 weeks. 2. New trace left pleural effusion. 3. Emphysema and hyperinflated lungs, suggesting COPD. 4. Stable bibasilar scarring. Electronically Signed   By: Janina Mayo.D.  On: 03/31/2016 08:41     CBC  Recent Labs Lab 03/31/16 0747 03/31/16 0758 04/01/16 0254  WBC 11.4*  --  11.6*  HGB 12.5 13.3 11.4*  HCT 38.7 39.0 35.7*  PLT 166  --  212  MCV 87.8  --  87.5  MCH 28.3  --  27.9  MCHC 32.3  --  31.9  RDW 13.9  --  14.0  LYMPHSABS 1.7  --   --   MONOABS 0.9  --   --   EOSABS 0.1  --   --   BASOSABS 0.0  --   --     Chemistries   Recent Labs Lab 03/31/16 0758 04/01/16 0254  NA 138 140  K 3.8 4.5  CL 100* 101  CO2  --  30  GLUCOSE 137* 137*  BUN 9 12  CREATININE 0.60 0.48  CALCIUM  --  9.5   ------------------------------------------------------------------------------------------------------------------ estimated creatinine clearance is 41.2 mL/min (by C-G formula based on SCr of 0.48 mg/dL). ------------------------------------------------------------------------------------------------------------------ No results for input(s): HGBA1C in the last 72  hours. ------------------------------------------------------------------------------------------------------------------ No results for input(s): CHOL, HDL, LDLCALC, TRIG, CHOLHDL, LDLDIRECT in the last 72 hours. ------------------------------------------------------------------------------------------------------------------ No results for input(s): TSH, T4TOTAL, T3FREE, THYROIDAB in the last 72 hours.  Invalid input(s): FREET3 ------------------------------------------------------------------------------------------------------------------ No results for input(s): VITAMINB12, FOLATE, FERRITIN, TIBC, IRON, RETICCTPCT in the last 72 hours.  Coagulation profile No results for input(s): INR, PROTIME in the last 168 hours.  No results for input(s): DDIMER in the last 72 hours.  Cardiac Enzymes  Recent Labs Lab 03/31/16 0747 03/31/16 0940 03/31/16 1302  TROPONINI <0.03 <0.03 <0.03   ------------------------------------------------------------------------------------------------------------------ Invalid input(s): POCBNP   CBG: No results for input(s): GLUCAP in the last 168 hours.     Studies: No results found.    Lab Results  Component Value Date   HGBA1C 5.8 (H) 06/11/2015   Lab Results  Component Value Date   LDLCALC 81 01/21/2016   CREATININE 0.48 04/01/2016       Scheduled Meds: . azithromycin  500 mg Intravenous Q24H  . cefTRIAXone (ROCEPHIN)  IV  1 g Intravenous Q24H  . enoxaparin (LOVENOX) injection  40 mg Subcutaneous Q24H  . fluticasone furoate-vilanterol  1 puff Inhalation Daily  . guaiFENesin  1,200 mg Oral BID  . levalbuterol  1.25 mg Nebulization TID  . loratadine  10 mg Oral Daily  . methylPREDNISolone (SOLU-MEDROL) injection  60 mg Intravenous Q12H  . montelukast  10 mg Oral QHS  . pantoprazole  20 mg Oral BID  . sodium chloride flush  3 mL Intravenous Q12H  . umeclidinium bromide  1 puff Inhalation Daily   Continuous Infusions:    LOS:  1 day    Time spent: >30 MINS    [facility]  [facility] Pager [phone]. If 7PM-7AM, please contact night-coverage at [email or web address], password [facility] 04/02/2016, 12:03 PM  LOS: 1 day

## 2016-04-02 NOTE — Progress Notes (Signed)
Patient's breathing improved significantly post iv lasix.

## 2016-04-02 NOTE — Progress Notes (Signed)
Patient called out and stated that she couldn't breathe.  Charge Nurse Dunbar and myself went to assess patient.  Patient repositioned in the bed.  O2 incresed to 3 Liters.  Dr. Allyson Sabal notified.  Patient stated she was starting to feel better.  Dr. Allyson Sabal to order IV lasix.

## 2016-04-02 NOTE — Care Management Note (Addendum)
Case Management Note  Patient Details  Name: Heather Barnes MRN: [id number] Date of Birth: [birth year]  Subjective/Objective: Pt presented for SOB- Acute Respiratory Failure. Pt is from home with the support of daughter. Pt is on 02 @ 2L for home use.                    Action/Plan: CM did speak with pt in regards to disposition needs and pt is having difficulty affording medications. CM did provide pt with [place]. Pt feels like she will not need HHRN at this time. Pt's states daughter will be of assistance. CM will continue to monitor.   Expected Discharge Date:  04/02/16               Expected Discharge Plan:  [place]  In-House Referral:   N/A  Discharge planning Services  CM Consult  Post Acute Care Choice:   HOME HEALTH Choice offered to:   PATIENT  DME Arranged:   N/A DME Agency:    N/A  HH Arranged:   RN,PT [place] Agency:   [facility]  Status of Service:COMPLETED If discussed at Long Length of Stay Meetings, dates discussed:    Additional Comments: 1449 04-06-16 Heather Krauss, RN,BSN [phone] CM did speak with pt in regards to disposition needs. Pt is agreeable to [facility]- Pt has used [facility] in the past. Agency List provided and pt chose [facility]. CM did make referral and SOC to be within 24-48 hours of d/c. No further needs from CM at this time.    1151 04-02-16 Heather Krauss, RN,BSN [phone] CM did speak with Heather Barnes with [facility]- pt is a candidate for the [facility]. CM did provide pt with information. [facility] to assist with Pharmacy needs along with a SW. CM will continue to monitor.  Heather Roys, RN 04/02/2016, 10:29 AM

## 2016-04-02 NOTE — Consult Note (Signed)
            [facility] CM Primary Care Navigator  04/02/2016  [place] 1933/08/04 [id number]   Patient seen at the bedside to identify possibledischarge needs.  Patient noted on oxygen per nasal cannula. She uses home oxygen at 2 liters per minute. She states experiencing sharp pain to her left side radiating from front to back which seemed to take her breath away that had led to this admission.  Plan for discharge to home with possible home health services and daughter's assistance according to patient.  Patient confirms that primary careprovider is Dr. [name] with [place].   Transportation to doctors' appointments after discharge  is being provided by her friend Peter Congo), neighbors or church members who are available per patient. Son [name]) is her power of attorney and daughter [name]) who lives with her, will be able to assist with care needs at home. Daughter and son are both her primary caregivers as stated.   Patient states using [place] at [facility] to obtain medications with some difficulty affording since she is in the "donut hole" for mostly half of the year. Medication management at home is done by patient using chart check off as stated by patient. Inpatient CM provided [place] [facility] program brochure and referral to [facility] COPD/ PNA had been initiated.   Patient had verbalized preference to be referred to [place] worker for community resources due to limited income and [place] for medication assistance related to some difficulty affording medications such as inhalers/ breathing treatments.   Patienthad expressed understanding to call primary care provider's office once discharged,for a post discharge follow-up appointment within a week or sooner if needs arise. Patient letter provided as a reminder.  For additional questions please contact:  [name] A. Nur Rabold, BSN, RN-BC [facility] PRIMARY CARE  Navigator Cell: [phone]

## 2016-04-03 ENCOUNTER — Inpatient Hospital Stay (HOSPITAL_COMMUNITY): Payer: Medicare Other

## 2016-04-03 LAB — COMPREHENSIVE METABOLIC PANEL
ALK PHOS: 52 U/L (ref 38–126)
ALT: 27 U/L (ref 14–54)
ANION GAP: 9 (ref 5–15)
AST: 23 U/L (ref 15–41)
Albumin: 3.5 g/dL (ref 3.5–5.0)
BUN: 15 mg/dL (ref 6–20)
CALCIUM: 9.3 mg/dL (ref 8.9–10.3)
CHLORIDE: 101 mmol/L (ref 101–111)
CO2: 30 mmol/L (ref 22–32)
CREATININE: 0.46 mg/dL (ref 0.44–1.00)
GFR calc Af Amer: >60 mL/min (ref >60)
GFR calc non Af Amer: >60 mL/min (ref >60)
GLUCOSE: 136 mg/dL — AB (ref 65–99)
Potassium: 3.9 mmol/L (ref 3.5–5.1)
SODIUM: 140 mmol/L (ref 135–145)
Total Bilirubin: 0.4 mg/dL (ref 0.3–1.2)
Total Protein: 6.2 g/dL — ABNORMAL LOW (ref 6.5–8.1)

## 2016-04-03 MED ORDER — METHYLPREDNISOLONE SODIUM SUCC 125 MG IJ SOLR
60.0000 mg | Freq: Three times a day (TID) | INTRAMUSCULAR | Status: DC
  Administered 2016-04-03 – 2016-04-05 (×5): 60 mg via INTRAVENOUS
  Filled 2016-04-03 (×5): qty 2

## 2016-04-03 MED ORDER — LEVALBUTEROL HCL 0.63 MG/3ML IN NEBU: 0.6300 mg | INHALATION_SOLUTION | Freq: Four times a day (QID) | RESPIRATORY_TRACT | Status: DC

## 2016-04-03 MED ORDER — LEVALBUTEROL HCL 0.63 MG/3ML IN NEBU: 0.6300 mg | INHALATION_SOLUTION | RESPIRATORY_TRACT | Status: DC | PRN

## 2016-04-03 MED ORDER — FLUTICASONE PROPIONATE 50 MCG/ACT NA SUSP
2.0000 | NASAL | Status: DC | PRN
  Filled 2016-04-03: qty 16

## 2016-04-03 MED ORDER — VITAMIN D 1000 UNITS PO TABS
2000.0000 [IU] | ORAL_TABLET | Freq: Two times a day (BID) | ORAL | Status: DC
  Administered 2016-04-03 – 2016-04-06 (×6): 2000 [IU] via ORAL
  Filled 2016-04-03 (×6): qty 2

## 2016-04-03 MED ORDER — VITAMIN D 50 MCG (2000 UT) PO CAPS: 1.0000 | ORAL_CAPSULE | Freq: Two times a day (BID) | ORAL | Status: DC

## 2016-04-03 MED ORDER — LEVALBUTEROL HCL 0.63 MG/3ML IN NEBU
0.6300 mg | INHALATION_SOLUTION | RESPIRATORY_TRACT | Status: DC
  Administered 2016-04-03 – 2016-04-06 (×16): 0.63 mg via RESPIRATORY_TRACT
  Filled 2016-04-03 (×16): qty 3

## 2016-04-03 MED ORDER — UMECLIDINIUM BROMIDE 62.5 MCG/INH IN AEPB
1.0000 | INHALATION_SPRAY | Freq: Every day | RESPIRATORY_TRACT | Status: DC
  Administered 2016-04-04 – 2016-04-06 (×3): 1 via RESPIRATORY_TRACT
  Filled 2016-04-03: qty 7

## 2016-04-03 NOTE — Progress Notes (Addendum)
[facility] PROGRESS NOTE  Heather Barnes VOZ:[id number] DOB: 1934-05-28 DOA: 03/31/2016   PCP: [name], MD   Assessment/Plan: Active Problems:   Lung cancer, middle lobe (HCC)   COPD exacerbation (HCC)   Acute on chronic respiratory failure ([place])   Acute respiratory failure ([place])   CAP (community acquired pneumonia)   Chest pain   Tachycardia   82 y.o. female with medical history significant of asthma/COPD on home oxygen 2 L/m continuously, hypothyroidism/goiter, PVCs, HLD, RML cancer status post radiation treatment and said to be in remission, presenting with approximately 2-3 day history of worsening shortness of breath and 1 day history of left lower chest pain. Admitted for COPD exacerbation and pneumonia.  Assessment and plan  Acute on chronic hypoxic respiratory failure/COPD exacerbation/community-acquired pneumonia:  Likely multifactorial but predominantly due to COPD exacerbation with left upper and lower lobe pneumonias.  Pulmonary history significant for advanced COPD followed by Dr. Chase Caller and right middle lobe cancer stage I status post SB RT with resolution. Last CT performed July 2017 without evidence of recurrence.  - d/t to unresolved bronchospasm and repeated episode of flare on 10/28, Increased steroids from prednisone to IV Solu-Medrol 60 mg IV every 8 hours. - Supplemental O2, wean as able - Ceftriaxone, azithromycin, influenza PCR, HIV test negative, urine antigen testing never sent - Dr. Chase Caller notified of patient's admission per patient's request - Felt better on 10/28 morning but had a brief episode of worsening dyspnea and wheezing later that afternoon. Revisited and examined patient at bedside. Starting to feel better after a breathing treatment. Chest x-ray stable and no clinical or radiological features suggestive of volume overload. As indicated above, increased IV Solu-Medrol, when necessary BiPAP. If respiratory status worsens,  consult CCM and transfer to ICU. Flutter valve. Discussed with patient and RN. - Adjusted bronchodilator regimen. DC Breo Ellipta   Chest pain: Likely secondary to pleuritic and diaphragmatic irritation from infection. EKG reassuring and troponin negative. Associated with respirations and movement. - Treatment plan as above Cardiac enzymes negative  - Repeat EKG without acute findings. Chest pain has resolved without recurrence.  Tachycardia and hypotension: Likely secondary to acute stress, hypo-bulimia/dehydration, and medication induced from breathing treatments. - - Liver Xopenex over albuterol - Telemetry monitoring - Resolved.  Lung cancer - Status post radiation and said to be in remission.  Anemia - Periodically follow CBCs.    DVT prophylaxsis lovenox   Code Status:  Full code    Family Communication: Discussed in detail with the patient, all imaging results, lab results explained to the patient   Disposition Plan:  DC home when medically stable. Not stable for discharge.    *  Consultants:  None   Procedures:  None   Antibiotics: Anti-infectives    Start     Dose/Rate Route Frequency Ordered Stop   04/03/16 1000  azithromycin (ZITHROMAX) tablet 500 mg     500 mg Oral Daily 04/02/16 1430 04/08/16 0959   04/01/16 1000  cefTRIAXone (ROCEPHIN) 1 g in dextrose 5 % 50 mL IVPB     1 g 100 mL/hr over 30 Minutes Intravenous Every 24 hours 03/31/16 0925 04/08/16 0959   04/01/16 1000  azithromycin (ZITHROMAX) 500 mg in dextrose 5 % 250 mL IVPB  Status:  Discontinued     500 mg 250 mL/hr over 60 Minutes Intravenous Every 24 hours 03/31/16 0925 04/02/16 1430   03/31/16 0900  cefTRIAXone (ROCEPHIN) 1 g in dextrose 5 % 50 mL  IVPB     1 g 100 mL/hr over 30 Minutes Intravenous  Once 03/31/16 0856 03/31/16 1019   03/31/16 0900  azithromycin (ZITHROMAX) 500 mg in dextrose 5 % 250 mL IVPB     500 mg 250 mL/hr over 60 Minutes Intravenous  Once 03/31/16 0856 03/31/16  1107         HPI/Subjective: This morning stated that she felt better with improving dyspnea although not at baseline, intermittent dry cough but no chest pain. Was even asking if she could go home today. Later in the afternoon at approximately 2:15 PM, had a brief episode of worsening dyspnea, chest tightness and wheezing which improved after breathing treatment.  Objective: Vitals:   04/03/16 0854 04/03/16 1336 04/03/16 1405 04/03/16 1419  BP:    (!) 158/64  Pulse:      Resp:      Temp:   97.8 F (36.6 C)   TempSrc:   Oral   SpO2: 95% 97%    Weight:      Height:      Pulse 83/m, respiratory rate 18 per minute  Intake/Output Summary (Last 24 hours) at 04/03/16 1445 Last data filed at 04/03/16 1400  Gross per 24 hour  Intake              600 ml  Output             1650 ml  Net            -1050 ml    Exam:  Examination:  General exam: Pleasant elderly female sitting up at edge of bed, slightly anxious with minimal increased work of breathing with speaking. Accessory muscles not active. Respiratory system: Diminished breath sounds bilaterally with scattered occasional expiratory medium pitched rhonchi. Cardiovascular system: S1 & S2 heard, RRR. No JVD, murmurs, rubs, gallops or clicks. No pedal edema. Telemetry: Sinus rhythm. Gastrointestinal system: Abdomen is nondistended, soft and nontender. No organomegaly or masses felt. Normal bowel sounds heard. Central nervous system: Alert and oriented. No focal neurological deficits. Extremities: Symmetric 5 x 5 power. Skin: No rashes, lesions or ulcers Psychiatry: Judgement and insight appear normal. Mood & affect appropriate.     Data Reviewed: I have personally reviewed following labs and imaging studies  Micro Results Recent Results (from the past 240 hour(s))  Culture, blood (routine x 2) Call MD if unable to obtain prior to antibiotics being given     Status: None (Preliminary result)   Collection Time: 03/31/16  9:25  AM  Result Value Ref Range Status   Specimen Description BLOOD LEFT FOREARM  Final   Special Requests BOTTLES DRAWN AEROBIC AND ANAEROBIC 5CC  Final   Culture NO GROWTH 3 DAYS  Final   Report Status PENDING  Incomplete  Culture, blood (routine x 2) Call MD if unable to obtain prior to antibiotics being given     Status: None (Preliminary result)   Collection Time: 03/31/16  9:34 AM  Result Value Ref Range Status   Specimen Description BLOOD LEFT HAND  Final   Special Requests BOTTLES DRAWN AEROBIC AND ANAEROBIC 5CC  Final   Culture NO GROWTH 3 DAYS  Final   Report Status PENDING  Incomplete    Radiology Reports Dg Chest 2 View  Result Date: 03/31/2016 CLINICAL DATA:  Left lower chest pain and shortness of breath EXAM: CHEST  2 VIEW COMPARISON:  06/26/2015 chest radiograph. FINDINGS: Stable cardiomediastinal silhouette with normal heart size. No pneumothorax. Trace left pleural effusion, new. No  right pleural effusion. Hyperinflated lungs. Emphysema. Mild hazy opacity in the peripheral mid to upper left lung is new. Additional mild patchy opacity at the left lung base appears new. Linear opacities at both lung bases appear stable consistent with scarring. No pulmonary edema. Stable mild chronic compression deformity of a mid to lower thoracic vertebral body. IMPRESSION: 1. New mild hazy opacities in the peripheral mid to upper left lung and at the left lung base, which could represent a pneumonia. Recommend follow-up PA and lateral post treatment chest radiographs in 4-6 weeks. 2. New trace left pleural effusion. 3. Emphysema and hyperinflated lungs, suggesting COPD. 4. Stable bibasilar scarring. Electronically Signed   By: Ilona Sorrel M.D.   On: 03/31/2016 08:41   Dg Chest Port 1 View  Result Date: 04/03/2016 CLINICAL DATA:  Severe short of breath today EXAM: PORTABLE CHEST 1 VIEW COMPARISON:  03/31/2016 FINDINGS: Upper normal heart size. Hazy airspace disease in the left mid and lower lung  zone is stable. Right lung is clear. Mild dextroscoliosis of the thoracic spine. No pneumothorax. IMPRESSION: Stable left lung airspace disease. Electronically Signed   By: Marybelle Killings M.D.   On: 04/03/2016 14:29     CBC  Recent Labs Lab 03/31/16 0747 03/31/16 0758 04/01/16 0254  WBC 11.4*  --  11.6*  HGB 12.5 13.3 11.4*  HCT 38.7 39.0 35.7*  PLT 166  --  212  MCV 87.8  --  87.5  MCH 28.3  --  27.9  MCHC 32.3  --  31.9  RDW 13.9  --  14.0  LYMPHSABS 1.7  --   --   MONOABS 0.9  --   --   EOSABS 0.1  --   --   BASOSABS 0.0  --   --     Chemistries   Recent Labs Lab 03/31/16 0758 04/01/16 0254 04/03/16 0512  NA 138 140 140  K 3.8 4.5 3.9  CL 100* 101 101  CO2  --  30 30  GLUCOSE 137* 137* 136*  BUN '9 12 15  '$ CREATININE 0.60 0.48 0.46  CALCIUM  --  9.5 9.3  AST  --   --  23  ALT  --   --  27  ALKPHOS  --   --  52  BILITOT  --   --  0.4   ------------------------------------------------------------------------------------------------------------------ estimated creatinine clearance is 41 mL/min (by C-G formula based on SCr of 0.46 mg/dL). ------------------------------------------------------------------------------------------------------------------ No results for input(s): HGBA1C in the last 72 hours. ------------------------------------------------------------------------------------------------------------------ No results for input(s): CHOL, HDL, LDLCALC, TRIG, CHOLHDL, LDLDIRECT in the last 72 hours. ------------------------------------------------------------------------------------------------------------------ No results for input(s): TSH, T4TOTAL, T3FREE, THYROIDAB in the last 72 hours.  Invalid input(s): FREET3 ------------------------------------------------------------------------------------------------------------------ No results for input(s): VITAMINB12, FOLATE, FERRITIN, TIBC, IRON, RETICCTPCT in the last 72 hours.  Coagulation profile No  results for input(s): INR, PROTIME in the last 168 hours.  No results for input(s): DDIMER in the last 72 hours.  Cardiac Enzymes  Recent Labs Lab 03/31/16 0747 03/31/16 0940 03/31/16 1302  TROPONINI <0.03 <0.03 <0.03   ------------------------------------------------------------------------------------------------------------------ Invalid input(s): POCBNP   CBG: No results for input(s): GLUCAP in the last 168 hours.        Lab Results  Component Value Date   HGBA1C 5.8 (H) 06/11/2015   Lab Results  Component Value Date   LDLCALC 81 01/21/2016   CREATININE 0.46 04/03/2016       Scheduled Meds: . azithromycin  500 mg Oral Daily  . cefTRIAXone (ROCEPHIN)  IV  1 g Intravenous Q24H  . enoxaparin (LOVENOX) injection  40 mg Subcutaneous Q24H  . fluticasone furoate-vilanterol  1 puff Inhalation Daily  . guaiFENesin  1,200 mg Oral BID  . levalbuterol  1.25 mg Nebulization TID  . loratadine  10 mg Oral Daily  . methylPREDNISolone (SOLU-MEDROL) injection  60 mg Intravenous Q12H  . montelukast  10 mg Oral QHS  . pantoprazole  20 mg Oral BID  . sodium chloride flush  3 mL Intravenous Q12H  . umeclidinium bromide  1 puff Inhalation Daily   Continuous Infusions:    LOS: 2 days    HONGALGI,ANAND, MD, FACP, FHM. [facility] Pager [phone]  If 7PM-7AM, please contact night-coverage [email or web address] Password [email or web address] 04/03/2016, 2:46 PM

## 2016-04-03 NOTE — Progress Notes (Signed)
Pt's breathing has improved. Pt resting in bed. Will cont to monitor pt.

## 2016-04-03 NOTE — Progress Notes (Signed)
Pt feeling SOB after a breathing treatment while resting in bed. O2 sat 98% 2L nasal canula. MD on call aware. New order received. Will cont to monitor pt.

## 2016-04-04 LAB — CBC
HEMATOCRIT: 38.5 % (ref 36.0–46.0)
HEMOGLOBIN: 12.3 g/dL (ref 12.0–15.0)
MCH: 27.9 pg (ref 26.0–34.0)
MCHC: 31.9 g/dL (ref 30.0–36.0)
MCV: 87.3 fL (ref 78.0–100.0)
PLATELETS: 265 10*3/uL (ref 150–400)
RBC: 4.41 MIL/uL (ref 3.87–5.11)
RDW: 14.1 % (ref 11.5–15.5)
WBC: 14.6 10*3/uL — AB (ref 4.0–10.5)

## 2016-04-04 MED ORDER — SACCHAROMYCES BOULARDII 250 MG PO CAPS
250.0000 mg | ORAL_CAPSULE | Freq: Two times a day (BID) | ORAL | Status: DC
  Administered 2016-04-04 – 2016-04-06 (×4): 250 mg via ORAL
  Filled 2016-04-04 (×4): qty 1

## 2016-04-04 NOTE — Progress Notes (Signed)
[facility] PROGRESS NOTE  Heather Barnes VZC:[id number] DOB: November 25, 1933 DOA: 03/31/2016   PCP: [name], MD   Assessment/Plan: Active Problems:   Lung cancer, middle lobe (HCC)   COPD exacerbation (HCC)   Acute on chronic respiratory failure ([place])   Acute respiratory failure ([place])   CAP (community acquired pneumonia)   Chest pain   Tachycardia   82 y.o. female with medical history significant of asthma/COPD on home oxygen 2 L/m continuously, hypothyroidism/goiter, PVCs, HLD, RML cancer status post radiation treatment and said to be in remission, presenting with approximately 2-3 day history of worsening shortness of breath and 1 day history of left lower chest pain. Admitted for COPD exacerbation and pneumonia.  Assessment and plan  Acute on chronic hypoxic respiratory failure/COPD exacerbation/community-acquired pneumonia:  Likely multifactorial but predominantly due to COPD exacerbation with left upper and lower lobe pneumonias.  Pulmonary history significant for advanced COPD followed by Dr. Chase Caller and right middle lobe cancer stage I status post SB RT with resolution. Last CT performed July 2017 without evidence of recurrence.  - d/t to unresolved bronchospasm and repeated episode of flare on 10/28, Increased steroids from prednisone to IV Solu-Medrol 60 mg IV every 8 hours. - Supplemental O2, wean as able - Ceftriaxone, azithromycin, influenza PCR, HIV test negative, urine antigen testing never sent - Dr. Chase Caller notified of patient's admission per patient's request - Felt better on 10/28 morning but had a brief episode of worsening dyspnea and wheezing later that afternoon. Revisited and examined patient at bedside. Starting to feel better after a breathing treatment. Chest x-ray stable and no clinical or radiological features suggestive of volume overload. As indicated above, increased IV Solu-Medrol, when necessary BiPAP. If respiratory status worsens,  consult CCM and transfer to ICU. Flutter valve. Discussed with patient and RN. - Adjusted bronchodilator regimen. DC Breo Ellipta - Improved. Continue treatment for additional 24 hours and then reassess. - Will need follow-up chest x-ray upon discharge to ensure resolution of pneumonia findings.   Chest pain: Likely secondary to pleuritic and diaphragmatic irritation from infection. EKG reassuring and troponin negative. Associated with respirations and movement. - Treatment plan as above Cardiac enzymes negative  - Repeat EKG without acute findings. Chest pain has resolved without recurrence.  Tachycardia and hypotension: Likely secondary to acute stress, hypo-bulimia/dehydration, and medication induced from breathing treatments. - - Liver Xopenex over albuterol - Telemetry monitoring - Resolved.  Lung cancer - Status post radiation and said to be in remission.  Anemia - Periodically follow CBCs.  Diarrhea - Reports 3 episodes of diarrhea in the last 24 hours. Has completed 5 of 7 days of antibiotics for pneumonia. Add probiotics and monitor closely.    DVT prophylaxsis lovenox   Code Status:  Full code    Family Communication: Discussed in detail with the patient, all imaging results, lab results explained to the patient   Disposition Plan:  DC home when medically stable. Not stable for discharge.    *  Consultants:  None   Procedures:  None   Antibiotics: Anti-infectives    Start     Dose/Rate Route Frequency Ordered Stop   04/03/16 1000  azithromycin (ZITHROMAX) tablet 500 mg     500 mg Oral Daily 04/02/16 1430 04/08/16 0959   04/01/16 1000  cefTRIAXone (ROCEPHIN) 1 g in dextrose 5 % 50 mL IVPB     1 g 100 mL/hr over 30 Minutes Intravenous Every 24 hours 03/31/16 0925 04/08/16 0959  04/01/16 1000  azithromycin (ZITHROMAX) 500 mg in dextrose 5 % 250 mL IVPB  Status:  Discontinued     500 mg 250 mL/hr over 60 Minutes Intravenous Every 24 hours 03/31/16 0925  04/02/16 1430   03/31/16 0900  cefTRIAXone (ROCEPHIN) 1 g in dextrose 5 % 50 mL IVPB     1 g 100 mL/hr over 30 Minutes Intravenous  Once 03/31/16 0856 03/31/16 1019   03/31/16 0900  azithromycin (ZITHROMAX) 500 mg in dextrose 5 % 250 mL IVPB     500 mg 250 mL/hr over 60 Minutes Intravenous  Once 03/31/16 0856 03/31/16 1107         HPI/Subjective: States that she feels much better compared to yesterday with significant improvement in her breathing and chest tightness. Able to bring up more sputum after flutter valve, initially yellow but now white. No chest pain reported.  Objective: Vitals:   04/04/16 0725 04/04/16 1115 04/04/16 1325 04/04/16 1547  BP:   (!) 160/55   Pulse:   (!) 105   Resp:   18   Temp:   98.1 F (36.7 C)   TempSrc:   Oral   SpO2: 98% 99% 95% 91%  Weight:      Height:        Intake/Output Summary (Last 24 hours) at 04/04/16 1554 Last data filed at 04/04/16 0800  Gross per 24 hour  Intake              240 ml  Output                0 ml  Net              240 ml    Exam:  Examination:  General exam: Pleasant elderly female sitting up at edge of bed Getting ready to eat breakfast this morning, looks much improved compared to yesterday and in no distress. Respiratory system: Improved breath sounds compared to 10/28. Occasional rhonchi posteriorly. No crackles. No increased work of breathing. Cardiovascular system: S1 & S2 heard, RRR. No JVD, murmurs, rubs, gallops or clicks. No pedal edema. Telemetry: Sinus rhythm. Gastrointestinal system: Abdomen is nondistended, soft and nontender. No organomegaly or masses felt. Normal bowel sounds heard. Central nervous system: Alert and oriented. No focal neurological deficits. Extremities: Symmetric 5 x 5 power. Skin: No rashes, lesions or ulcers Psychiatry: Judgement and insight appear normal. Mood & affect appropriate.     Data Reviewed: I have personally reviewed following labs and imaging studies  Micro  Results Recent Results (from the past 240 hour(s))  Culture, blood (routine x 2) Call MD if unable to obtain prior to antibiotics being given     Status: None (Preliminary result)   Collection Time: 03/31/16  9:25 AM  Result Value Ref Range Status   Specimen Description BLOOD LEFT FOREARM  Final   Special Requests BOTTLES DRAWN AEROBIC AND ANAEROBIC 5CC  Final   Culture NO GROWTH 4 DAYS  Final   Report Status PENDING  Incomplete  Culture, blood (routine x 2) Call MD if unable to obtain prior to antibiotics being given     Status: None (Preliminary result)   Collection Time: 03/31/16  9:34 AM  Result Value Ref Range Status   Specimen Description BLOOD LEFT HAND  Final   Special Requests BOTTLES DRAWN AEROBIC AND ANAEROBIC 5CC  Final   Culture NO GROWTH 4 DAYS  Final   Report Status PENDING  Incomplete    Radiology Reports Dg Chest 2  View  Result Date: 03/31/2016 CLINICAL DATA:  Left lower chest pain and shortness of breath EXAM: CHEST  2 VIEW COMPARISON:  06/26/2015 chest radiograph. FINDINGS: Stable cardiomediastinal silhouette with normal heart size. No pneumothorax. Trace left pleural effusion, new. No right pleural effusion. Hyperinflated lungs. Emphysema. Mild hazy opacity in the peripheral mid to upper left lung is new. Additional mild patchy opacity at the left lung base appears new. Linear opacities at both lung bases appear stable consistent with scarring. No pulmonary edema. Stable mild chronic compression deformity of a mid to lower thoracic vertebral body. IMPRESSION: 1. New mild hazy opacities in the peripheral mid to upper left lung and at the left lung base, which could represent a pneumonia. Recommend follow-up PA and lateral post treatment chest radiographs in 4-6 weeks. 2. New trace left pleural effusion. 3. Emphysema and hyperinflated lungs, suggesting COPD. 4. Stable bibasilar scarring. Electronically Signed   By: Ilona Sorrel M.D.   On: 03/31/2016 08:41   Dg Chest Port 1  View  Result Date: 04/03/2016 CLINICAL DATA:  Severe short of breath today EXAM: PORTABLE CHEST 1 VIEW COMPARISON:  03/31/2016 FINDINGS: Upper normal heart size. Hazy airspace disease in the left mid and lower lung zone is stable. Right lung is clear. Mild dextroscoliosis of the thoracic spine. No pneumothorax. IMPRESSION: Stable left lung airspace disease. Electronically Signed   By: Marybelle Killings M.D.   On: 04/03/2016 14:29     CBC  Recent Labs Lab 03/31/16 0747 03/31/16 0758 04/01/16 0254 04/04/16 0423  WBC 11.4*  --  11.6* 14.6*  HGB 12.5 13.3 11.4* 12.3  HCT 38.7 39.0 35.7* 38.5  PLT 166  --  212 265  MCV 87.8  --  87.5 87.3  MCH 28.3  --  27.9 27.9  MCHC 32.3  --  31.9 31.9  RDW 13.9  --  14.0 14.1  LYMPHSABS 1.7  --   --   --   MONOABS 0.9  --   --   --   EOSABS 0.1  --   --   --   BASOSABS 0.0  --   --   --     Chemistries   Recent Labs Lab 03/31/16 0758 04/01/16 0254 04/03/16 0512  NA 138 140 140  K 3.8 4.5 3.9  CL 100* 101 101  CO2  --  30 30  GLUCOSE 137* 137* 136*  BUN '9 12 15  '$ CREATININE 0.60 0.48 0.46  CALCIUM  --  9.5 9.3  AST  --   --  23  ALT  --   --  27  ALKPHOS  --   --  52  BILITOT  --   --  0.4   ------------------------------------------------------------------------------------------------------------------ estimated creatinine clearance is 41 mL/min (by C-G formula based on SCr of 0.46 mg/dL). ------------------------------------------------------------------------------------------------------------------ No results for input(s): HGBA1C in the last 72 hours. ------------------------------------------------------------------------------------------------------------------ No results for input(s): CHOL, HDL, LDLCALC, TRIG, CHOLHDL, LDLDIRECT in the last 72 hours. ------------------------------------------------------------------------------------------------------------------ No results for input(s): TSH, T4TOTAL, T3FREE, THYROIDAB in the  last 72 hours.  Invalid input(s): FREET3 ------------------------------------------------------------------------------------------------------------------ No results for input(s): VITAMINB12, FOLATE, FERRITIN, TIBC, IRON, RETICCTPCT in the last 72 hours.  Coagulation profile No results for input(s): INR, PROTIME in the last 168 hours.  No results for input(s): DDIMER in the last 72 hours.  Cardiac Enzymes  Recent Labs Lab 03/31/16 0747 03/31/16 0940 03/31/16 1302  TROPONINI <0.03 <0.03 <0.03   ------------------------------------------------------------------------------------------------------------------ Invalid input(s): POCBNP   CBG: No results for  input(s): GLUCAP in the last 168 hours.        Lab Results  Component Value Date   HGBA1C 5.8 (H) 06/11/2015   Lab Results  Component Value Date   LDLCALC 81 01/21/2016   CREATININE 0.46 04/03/2016       Scheduled Meds: . azithromycin  500 mg Oral Daily  . cefTRIAXone (ROCEPHIN)  IV  1 g Intravenous Q24H  . cholecalciferol  2,000 Units Oral BID  . enoxaparin (LOVENOX) injection  40 mg Subcutaneous Q24H  . guaiFENesin  1,200 mg Oral BID  . levalbuterol  0.63 mg Nebulization Q4H  . loratadine  10 mg Oral Daily  . methylPREDNISolone (SOLU-MEDROL) injection  60 mg Intravenous Q8H  . montelukast  10 mg Oral QHS  . pantoprazole  20 mg Oral BID  . sodium chloride flush  3 mL Intravenous Q12H  . umeclidinium bromide  1 puff Inhalation Daily   Continuous Infusions:    LOS: 3 days    Disaya Walt, MD, FACP, FHM. [facility] Pager [phone]  If 7PM-7AM, please contact night-coverage [email or web address] Password [email or web address] 04/04/2016, 3:54 PM

## 2016-04-05 DIAGNOSIS — J9620 Acute and chronic respiratory failure, unspecified whether with hypoxia or hypercapnia: Secondary | ICD-10-CM

## 2016-04-05 LAB — C DIFFICILE QUICK SCREEN W PCR REFLEX
C DIFFICILE (CDIFF) TOXIN: NEGATIVE
C Diff antigen: NEGATIVE
C Diff interpretation: NOT DETECTED

## 2016-04-05 LAB — CULTURE, BLOOD (ROUTINE X 2)
CULTURE: NO GROWTH
Culture: NO GROWTH

## 2016-04-05 MED ORDER — PREDNISONE 20 MG PO TABS
40.0000 mg | ORAL_TABLET | Freq: Two times a day (BID) | ORAL | Status: DC
  Administered 2016-04-05 – 2016-04-06 (×2): 40 mg via ORAL
  Filled 2016-04-05 (×2): qty 2

## 2016-04-05 NOTE — Progress Notes (Signed)
[facility] PROGRESS NOTE  Heather Barnes POE:[id number] DOB: 12-29-1933 DOA: 03/31/2016   PCP: [name], MD   Assessment/Plan: Active Problems:   Lung cancer, middle lobe (HCC)   COPD exacerbation (HCC)   Acute on chronic respiratory failure ([place])   Acute respiratory failure ([place])   CAP (community acquired pneumonia)   Chest pain   Tachycardia   82 y.o. female with medical history significant of asthma/COPD on home oxygen 2 L/m continuously, hypothyroidism/goiter, PVCs, HLD, RML cancer status post radiation treatment and said to be in remission, presenting with approximately 2-3 day history of worsening shortness of breath and 1 day history of left lower chest pain. Admitted for COPD exacerbation and pneumonia.  Assessment and plan  Acute on chronic hypoxic respiratory failure/COPD exacerbation/community-acquired pneumonia:  Likely multifactorial but predominantly due to COPD exacerbation with left upper and lower lobe pneumonias.  Pulmonary history significant for advanced COPD followed by Dr. Chase Caller and right middle lobe cancer stage I status post SB RT with resolution. Last CT performed July 2017 without evidence of recurrence.  - d/t to unresolved bronchospasm and repeated episode of flare on 10/28, Increased steroids from prednisone to IV Solu-Medrol 60 mg IV every 8 hours. Transition  back to PO steroids 10/30 and monitor. Continue to wean oxygen, - Ceftriaxone, azithromycin, influenza PCR, HIV test negative, urine antigen testing never sent - Dr. Chase Caller notified of patient's admission per patient's request - Continue when necessary BiPAP. If respiratory status worsens, consult CCM and transfer to ICU. Flutter valve.  - Adjusted bronchodilator regimen.   - Improved. Continue treatment for additional 24 hours and then reassess. Follow-up chest x-ray unchanged   Chest pain: Likely secondary to pleuritic and diaphragmatic irritation from infection.  EKG reassuring and troponin negative. Associated with respirations and movement. - Treatment plan as above Cardiac enzymes negative  - Repeat EKG without acute findings. Chest pain has resolved without recurrence.  Tachycardia and hypotension: Likely secondary to acute stress, hypo-bulimia/dehydration, and medication induced from breathing treatments. prefer Xopenex over albuterol - Telemetry monitoring - Resolved.  Lung cancer - Status post radiation and said to be in remission.  Anemia - Periodically follow CBCs.  Diarrhea  Diarrhea improving, continue probiotic Check for c diff   DVT prophylaxsis lovenox   Code Status:  Full code    Family Communication: Discussed in detail with the patient, all imaging results, lab results explained to the patient   Disposition Plan: Anticipate discharge in one to 2 days, PT OT evaluation    *  Consultants:  None   Procedures:  None   Antibiotics: Anti-infectives    Start     Dose/Rate Route Frequency Ordered Stop   04/03/16 1000  azithromycin (ZITHROMAX) tablet 500 mg     500 mg Oral Daily 04/02/16 1430 04/08/16 0959   04/01/16 1000  cefTRIAXone (ROCEPHIN) 1 g in dextrose 5 % 50 mL IVPB     1 g 100 mL/hr over 30 Minutes Intravenous Every 24 hours 03/31/16 0925 04/08/16 0959   04/01/16 1000  azithromycin (ZITHROMAX) 500 mg in dextrose 5 % 250 mL IVPB  Status:  Discontinued     500 mg 250 mL/hr over 60 Minutes Intravenous Every 24 hours 03/31/16 0925 04/02/16 1430   03/31/16 0900  cefTRIAXone (ROCEPHIN) 1 g in dextrose 5 % 50 mL IVPB     1 g 100 mL/hr over 30 Minutes Intravenous  Once 03/31/16 0856 03/31/16 1019   03/31/16 0900  azithromycin (  ZITHROMAX) 500 mg in dextrose 5 % 250 mL IVPB     500 mg 250 mL/hr over 60 Minutes Intravenous  Once 03/31/16 0856 03/31/16 1107         HPI/Subjective:  Still has a productive cough, initially yellow but now white. No chest pain reported. Loose stools at least 5 times  yesterday, 3 times today  Objective: Vitals:   04/05/16 0808 04/05/16 0827 04/05/16 1016 04/05/16 1115  BP:   130/60   Pulse:   92   Resp:      Temp:   98 F (36.7 C)   TempSrc:   Oral   SpO2: 93% 92% 96% 98%  Weight:      Height:        Intake/Output Summary (Last 24 hours) at 04/05/16 1301 Last data filed at 04/05/16 0946  Gross per 24 hour  Intake              330 ml  Output              300 ml  Net               30 ml    Exam:  Examination:  General exam: Pleasant elderly female sitting up at edge of bed Getting ready to eat breakfast this morning, looks much improved compared to yesterday and in no distress. Respiratory system: Improved breath sounds compared to 10/28. Occasional rhonchi posteriorly. No crackles. No increased work of breathing. Cardiovascular system: S1 & S2 heard, RRR. No JVD, murmurs, rubs, gallops or clicks. No pedal edema. Telemetry: Sinus rhythm. Gastrointestinal system: Abdomen is nondistended, soft and nontender. No organomegaly or masses felt. Normal bowel sounds heard. Central nervous system: Alert and oriented. No focal neurological deficits. Extremities: Symmetric 5 x 5 power. Skin: No rashes, lesions or ulcers Psychiatry: Judgement and insight appear normal. Mood & affect appropriate.     Data Reviewed: I have personally reviewed following labs and imaging studies  Micro Results Recent Results (from the past 240 hour(s))  Culture, blood (routine x 2) Call MD if unable to obtain prior to antibiotics being given     Status: None (Preliminary result)   Collection Time: 03/31/16  9:25 AM  Result Value Ref Range Status   Specimen Description BLOOD LEFT FOREARM  Final   Special Requests BOTTLES DRAWN AEROBIC AND ANAEROBIC 5CC  Final   Culture NO GROWTH 4 DAYS  Final   Report Status PENDING  Incomplete  Culture, blood (routine x 2) Call MD if unable to obtain prior to antibiotics being given     Status: None (Preliminary result)    Collection Time: 03/31/16  9:34 AM  Result Value Ref Range Status   Specimen Description BLOOD LEFT HAND  Final   Special Requests BOTTLES DRAWN AEROBIC AND ANAEROBIC 5CC  Final   Culture NO GROWTH 4 DAYS  Final   Report Status PENDING  Incomplete    Radiology Reports Dg Chest 2 View  Result Date: 03/31/2016 CLINICAL DATA:  Left lower chest pain and shortness of breath EXAM: CHEST  2 VIEW COMPARISON:  06/26/2015 chest radiograph. FINDINGS: Stable cardiomediastinal silhouette with normal heart size. No pneumothorax. Trace left pleural effusion, new. No right pleural effusion. Hyperinflated lungs. Emphysema. Mild hazy opacity in the peripheral mid to upper left lung is new. Additional mild patchy opacity at the left lung base appears new. Linear opacities at both lung bases appear stable consistent with scarring. No pulmonary edema. Stable mild chronic compression deformity  of a mid to lower thoracic vertebral body. IMPRESSION: 1. New mild hazy opacities in the peripheral mid to upper left lung and at the left lung base, which could represent a pneumonia. Recommend follow-up PA and lateral post treatment chest radiographs in 4-6 weeks. 2. New trace left pleural effusion. 3. Emphysema and hyperinflated lungs, suggesting COPD. 4. Stable bibasilar scarring. Electronically Signed   By: Ilona Sorrel M.D.   On: 03/31/2016 08:41   Dg Chest Port 1 View  Result Date: 04/03/2016 CLINICAL DATA:  Severe short of breath today EXAM: PORTABLE CHEST 1 VIEW COMPARISON:  03/31/2016 FINDINGS: Upper normal heart size. Hazy airspace disease in the left mid and lower lung zone is stable. Right lung is clear. Mild dextroscoliosis of the thoracic spine. No pneumothorax. IMPRESSION: Stable left lung airspace disease. Electronically Signed   By: Marybelle Killings M.D.   On: 04/03/2016 14:29     CBC  Recent Labs Lab 03/31/16 0747 03/31/16 0758 04/01/16 0254 04/04/16 0423  WBC 11.4*  --  11.6* 14.6*  HGB 12.5 13.3 11.4*  12.3  HCT 38.7 39.0 35.7* 38.5  PLT 166  --  212 265  MCV 87.8  --  87.5 87.3  MCH 28.3  --  27.9 27.9  MCHC 32.3  --  31.9 31.9  RDW 13.9  --  14.0 14.1  LYMPHSABS 1.7  --   --   --   MONOABS 0.9  --   --   --   EOSABS 0.1  --   --   --   BASOSABS 0.0  --   --   --     Chemistries   Recent Labs Lab 03/31/16 0758 04/01/16 0254 04/03/16 0512  NA 138 140 140  K 3.8 4.5 3.9  CL 100* 101 101  CO2  --  30 30  GLUCOSE 137* 137* 136*  BUN '9 12 15  '$ CREATININE 0.60 0.48 0.46  CALCIUM  --  9.5 9.3  AST  --   --  23  ALT  --   --  27  ALKPHOS  --   --  52  BILITOT  --   --  0.4   ------------------------------------------------------------------------------------------------------------------ estimated creatinine clearance is 41.2 mL/min (by C-G formula based on SCr of 0.46 mg/dL). ------------------------------------------------------------------------------------------------------------------ No results for input(s): HGBA1C in the last 72 hours. ------------------------------------------------------------------------------------------------------------------ No results for input(s): CHOL, HDL, LDLCALC, TRIG, CHOLHDL, LDLDIRECT in the last 72 hours. ------------------------------------------------------------------------------------------------------------------ No results for input(s): TSH, T4TOTAL, T3FREE, THYROIDAB in the last 72 hours.  Invalid input(s): FREET3 ------------------------------------------------------------------------------------------------------------------ No results for input(s): VITAMINB12, FOLATE, FERRITIN, TIBC, IRON, RETICCTPCT in the last 72 hours.  Coagulation profile No results for input(s): INR, PROTIME in the last 168 hours.  No results for input(s): DDIMER in the last 72 hours.  Cardiac Enzymes  Recent Labs Lab 03/31/16 0747 03/31/16 0940 03/31/16 1302  TROPONINI <0.03 <0.03 <0.03    ------------------------------------------------------------------------------------------------------------------ Invalid input(s): POCBNP   CBG: No results for input(s): GLUCAP in the last 168 hours.        Lab Results  Component Value Date   HGBA1C 5.8 (H) 06/11/2015   Lab Results  Component Value Date   LDLCALC 81 01/21/2016   CREATININE 0.46 04/03/2016       Scheduled Meds: . azithromycin  500 mg Oral Daily  . cefTRIAXone (ROCEPHIN)  IV  1 g Intravenous Q24H  . cholecalciferol  2,000 Units Oral BID  . enoxaparin (LOVENOX) injection  40 mg Subcutaneous Q24H  . guaiFENesin  1,200 mg Oral BID  . levalbuterol  0.63 mg Nebulization Q4H  . loratadine  10 mg Oral Daily  . methylPREDNISolone (SOLU-MEDROL) injection  60 mg Intravenous Q8H  . montelukast  10 mg Oral QHS  . pantoprazole  20 mg Oral BID  . saccharomyces boulardii  250 mg Oral BID  . sodium chloride flush  3 mL Intravenous Q12H  . umeclidinium bromide  1 puff Inhalation Daily   Continuous Infusions:    LOS: 4 days    Reyne Dumas, MD, FACP, FHM. [facility] Pager [phone]  If 7PM-7AM, please contact night-coverage [email or web address] Password [email or web address] 04/05/2016, 1:01 PM

## 2016-04-05 NOTE — Care Management Important Message (Signed)
Important Message  Patient Details  Name: Heather Barnes MRN: [id number] Date of Birth: 07-08-1933   Medicare Important Message Given:  Yes    Jsoeph Podesta [address] 04/05/2016, 5:33 PM

## 2016-04-05 NOTE — Evaluation (Signed)
Physical Therapy Evaluation Patient Details Name: Heather Barnes MRN: [id number] DOB: [birth year] Today's Date: 04/05/2016   History of Present Illness  Patient is a 82 y/o female with hx of COPD, asthma, non-small cell lung ca of right middle lobe, PNA, HLD presents with 2-3 day history of worsening shortness of breath and 1 day history of left lower chest pain. Admitted for COPD exacerbation and pneumonia.  Clinical Impression  Patient presents with dyspnea on exertion, decreased activity tolerance and impaired endurance s/p above. Tolerated gait training with Min guard assist for safety and able to maintain Sp02 >90% on 3L/min 02. Cues for pursed lip breathing. Education on energy conservation techniques and importance of performing short bouts of activity at home with longer rest breaks. Pt will have support from daughter at home. Will follow acutely to maximize independence and mobility prior to return home. Encourage increasing activity level in hospital.    Follow Up Recommendations Supervision/Assistance - 24 hour;Supervision for mobility/OOB;Home health PT    Equipment Recommendations  None recommended by PT    Recommendations for Other Services       Precautions / Restrictions Precautions Precautions: Fall Precaution Comments: watch 02 Restrictions Weight Bearing Restrictions: No      Mobility  Bed Mobility Overal bed mobility: Modified Independent             General bed mobility comments: No physical assist needed.   Transfers Overall transfer level: Needs assistance Equipment used: None Transfers: Sit to/from Stand Sit to Stand: Supervision         General transfer comment: Supervision for safety. Transferred to chair post ambulation.   Ambulation/Gait Ambulation/Gait assistance: Min guard Ambulation Distance (Feet): 80 Feet Assistive device: None Gait Pattern/deviations: Step-through pattern;Drifts right/left Gait velocity: decreased   General  Gait Details: Slow, mostly steady gait with cues for pursed lip breathing. Sp02 stayed >90% on 3L/min 02. 3/4 DOE.  Stairs            Wheelchair Mobility    Modified Rankin (Stroke Patients Only)       Balance Overall balance assessment: Needs assistance Sitting-balance support: Feet supported;No upper extremity supported Sitting balance-Leahy Scale: Good     Standing balance support: During functional activity Standing balance-Leahy Scale: Fair                               Pertinent Vitals/Pain Pain Assessment: No/denies pain    Home Living Family/patient expects to be discharged to:: Private residence Living Arrangements: Children (with daughter) Available Help at Discharge: Family;Available 24 hours/day Type of Home: House Home Access: Level entry     Home Layout: One level Home Equipment: Programme researcher, broadcasting/film/video - 4 wheels Additional Comments: chronic 2L O2    Prior Function Level of Independence: Independent with assistive device(s)         Comments: uses transport chair for community; uses rollator for household ambulation (mainly uses the seat). Gets rides from neighbors/friends for appts. Cooks.      Hand Dominance        Extremity/Trunk Assessment   Upper Extremity Assessment: Defer to OT evaluation           Lower Extremity Assessment: Overall WFL for tasks assessed         Communication   Communication: HOH  Cognition Arousal/Alertness: Awake/alert Behavior During Therapy: WFL for tasks assessed/performed Overall Cognitive Status: Within Functional Limits for tasks assessed  General Comments General comments (skin integrity, edema, etc.): Son present towards end of session.    Exercises     Assessment/Plan    PT Assessment Patient needs continued PT services  PT Problem List Decreased strength;Decreased mobility;Decreased balance;Decreased activity tolerance;Cardiopulmonary status  limiting activity          PT Treatment Interventions DME instruction;Therapeutic activities;Gait training;Therapeutic exercise;Patient/family education;Balance training;Functional mobility training    PT Goals (Current goals can be found in the Care Plan section)  Acute Rehab PT Goals Patient Stated Goal: to be able to breathe PT Goal Formulation: With patient Time For Goal Achievement: 04/19/16 Potential to Achieve Goals: Good    Frequency Min 3X/week   Barriers to discharge        Co-evaluation               End of Session Equipment Utilized During Treatment: Oxygen Activity Tolerance: Patient tolerated treatment well Patient left: in bed;with call bell/phone within reach;with family/visitor present (sitting EOB.) Nurse Communication: Mobility status         Time: 1539-1600 PT Time Calculation (min) (ACUTE ONLY): 21 min   Charges:   PT Evaluation $PT Eval Moderate Complexity: 1 Procedure     PT G Codes:        Jameelah Watts A Lola Lofaro 04/05/2016, 4:07 PM Wray Kearns, [place], DPT [phone]

## 2016-04-06 MED ORDER — GUAIFENESIN ER 600 MG PO TB12: 1200.0000 mg | ORAL_TABLET | Freq: Two times a day (BID) | ORAL | 0 refills | Status: AC

## 2016-04-06 MED ORDER — PREDNISONE 10 MG PO TABS: ORAL_TABLET | ORAL | 0 refills | Status: DC

## 2016-04-06 MED ORDER — SACCHAROMYCES BOULARDII 250 MG PO CAPS: 250.0000 mg | ORAL_CAPSULE | Freq: Two times a day (BID) | ORAL | 0 refills | Status: AC

## 2016-04-06 MED ORDER — PANTOPRAZOLE SODIUM 40 MG PO TBEC: 40.0000 mg | DELAYED_RELEASE_TABLET | Freq: Every day | ORAL | 1 refills | Status: DC

## 2016-04-06 MED ORDER — AZITHROMYCIN 500 MG PO TABS: 500.0000 mg | ORAL_TABLET | Freq: Every day | ORAL | 0 refills | Status: AC

## 2016-04-06 MED ORDER — CEFDINIR 300 MG PO CAPS: 300.0000 mg | ORAL_CAPSULE | Freq: Two times a day (BID) | ORAL | 0 refills | Status: AC

## 2016-04-06 NOTE — Discharge Summary (Signed)
Physician Discharge Summary  Heather Barnes MRN: [id number] DOB/AGE: [birth year] 82 y.o.  PCP: [name], MD   Admit date: 03/31/2016 Discharge date: 04/06/2016  Discharge Diagnoses:    Active Problems:   Lung cancer, middle lobe (HCC)   COPD exacerbation (HCC)   Acute on chronic respiratory failure (HCC)   Acute respiratory failure (HCC)   CAP (community acquired pneumonia)   Chest pain   Tachycardia    Follow-up recommendations Follow-up with PCP in 3-5 days , including all  additional recommended appointments as below Follow-up CBC, CMP in 3-5 days Patient to follow-up with pulmonary medicine in the next 1-2 weeks Patient is being discharged with home health      Current Discharge Medication List    START taking these medications   Details  azithromycin (ZITHROMAX) 500 MG tablet Take 1 tablet (500 mg total) by mouth daily. Qty: 5 tablet, Refills: 0    cefdinir (OMNICEF) 300 MG capsule Take 1 capsule (300 mg total) by mouth 2 (two) times daily. Qty: 10 capsule, Refills: 0    guaiFENesin (MUCINEX) 600 MG 12 hr tablet Take 2 tablets (1,200 mg total) by mouth 2 (two) times daily. Qty: 40 tablet, Refills: 0    pantoprazole (PROTONIX) 40 MG tablet Take 1 tablet (40 mg total) by mouth daily. Qty: 30 tablet, Refills: 1    predniSONE (DELTASONE) 10 MG tablet 6 tablets 4 days, 5 tablets 4 days, 4 tablets 4 days, 3 tablets 4 days, 2 tablets 4 days, 1 tablet 4 days, half a tablet 4 days Qty: 100 tablet, Refills: 0    saccharomyces boulardii (FLORASTOR) 250 MG capsule Take 1 capsule (250 mg total) by mouth 2 (two) times daily. Qty: 20 capsule, Refills: 0      CONTINUE these medications which have NOT CHANGED   Details  cetirizine (ZYRTEC) 10 MG tablet Take 10 mg by mouth daily.      Cholecalciferol (VITAMIN D) 2000 UNITS CAPS Take 1 capsule by mouth 2 (two) times daily.     fluticasone (FLONASE) 50 MCG/ACT nasal spray Place 2 sprays into the nose as  needed for allergies or rhinitis.     fluticasone furoate-vilanterol (BREO ELLIPTA) 100-25 MCG/INH AEPB Inhale 1 puff into the lungs daily. Qty: 60 each, Refills: 11    INCRUSE ELLIPTA 62.5 MCG/INH AEPB INHALE ONE PUFF ONCE DAILY Qty: 30 each, Refills: 5    Omega-3 Fatty Acids (FISH OIL) 1000 MG CAPS Take 1,000 mg by mouth at bedtime.     SINGULAIR 10 MG tablet Take 10 mg by mouth at bedtime.       STOP taking these medications     Multiple Vitamins-Minerals (MULTIVITAMIN WITH MINERALS) tablet          Discharge Condition: Stable   Discharge Instructions Get Medicines reviewed and adjusted: Please take all your medications with you for your next visit with your Primary MD  Please request your Primary MD to go over all hospital tests and procedure/radiological results at the follow up, please ask your Primary MD to get all Hospital records sent to his/her office.  If you experience worsening of your admission symptoms, develop shortness of breath, life threatening emergency, suicidal or homicidal thoughts you must seek medical attention immediately by calling 911 or calling your MD immediately if symptoms less severe.  You must read complete instructions/literature along with all the possible adverse reactions/side effects for all the Medicines you take and that have been prescribed to you. Take any new Medicines after  you have completely understood and accpet all the possible adverse reactions/side effects.   Do not drive when taking Pain medications.   Do not take more than prescribed Pain, Sleep and Anxiety Medications  Special Instructions: If you have smoked or chewed Tobacco in the last 2 yrs please stop smoking, stop any regular Alcohol and or any Recreational drug use.  Wear Seat belts while driving.  Please note  You were cared for by a hospitalist during your hospital stay. Once you are discharged, your primary care physician will handle any further medical  issues. Please note that NO REFILLS for any discharge medications will be authorized once you are discharged, as it is imperative that you return to your primary care physician (or establish a relationship with a primary care physician if you do not have one) for your aftercare needs so that they can reassess your need for medications and monitor your lab values.     Allergies  Allergen Reactions  . Tiotropium Bromide Monohydrate Hives, Shortness Of Breath and Rash    Spiriva   . Codeine Other (See Comments)    Hyperactivity,crying  . Fosamax [Alendronate] Other (See Comments)    Muscle aches  . Asa [Aspirin] Other (See Comments)    Bleeding -ulcers  . Latex Rash      Disposition: Home with home health   Consults: None   Significant Diagnostic Studies:  Dg Chest 2 View  Result Date: 03/31/2016 CLINICAL DATA:  Left lower chest pain and shortness of breath EXAM: CHEST  2 VIEW COMPARISON:  06/26/2015 chest radiograph. FINDINGS: Stable cardiomediastinal silhouette with normal heart size. No pneumothorax. Trace left pleural effusion, new. No right pleural effusion. Hyperinflated lungs. Emphysema. Mild hazy opacity in the peripheral mid to upper left lung is new. Additional mild patchy opacity at the left lung base appears new. Linear opacities at both lung bases appear stable consistent with scarring. No pulmonary edema. Stable mild chronic compression deformity of a mid to lower thoracic vertebral body. IMPRESSION: 1. New mild hazy opacities in the peripheral mid to upper left lung and at the left lung base, which could represent a pneumonia. Recommend follow-up PA and lateral post treatment chest radiographs in 4-6 weeks. 2. New trace left pleural effusion. 3. Emphysema and hyperinflated lungs, suggesting COPD. 4. Stable bibasilar scarring. Electronically Signed   By: Ilona Sorrel M.D.   On: 03/31/2016 08:41   Dg Chest Port 1 View  Result Date: 04/03/2016 CLINICAL DATA:  Severe short  of breath today EXAM: PORTABLE CHEST 1 VIEW COMPARISON:  03/31/2016 FINDINGS: Upper normal heart size. Hazy airspace disease in the left mid and lower lung zone is stable. Right lung is clear. Mild dextroscoliosis of the thoracic spine. No pneumothorax. IMPRESSION: Stable left lung airspace disease. Electronically Signed   By: Marybelle Killings M.D.   On: 04/03/2016 14:29        Filed Weights   04/03/16 0500 04/05/16 0538 04/06/16 0533  Weight: 54.3 kg (119 lb 11.2 oz) 55 kg (121 lb 4.8 oz) 54.6 kg (120 lb 6.4 oz)     Microbiology: Recent Results (from the past 240 hour(s))  Culture, blood (routine x 2) Call MD if unable to obtain prior to antibiotics being given     Status: None   Collection Time: 03/31/16  9:25 AM  Result Value Ref Range Status   Specimen Description BLOOD LEFT FOREARM  Final   Special Requests BOTTLES DRAWN AEROBIC AND ANAEROBIC 5CC  Final   Culture NO  GROWTH 5 DAYS  Final   Report Status 04/05/2016 FINAL  Final  Culture, blood (routine x 2) Call MD if unable to obtain prior to antibiotics being given     Status: None   Collection Time: 03/31/16  9:34 AM  Result Value Ref Range Status   Specimen Description BLOOD LEFT HAND  Final   Special Requests BOTTLES DRAWN AEROBIC AND ANAEROBIC 5CC  Final   Culture NO GROWTH 5 DAYS  Final   Report Status 04/05/2016 FINAL  Final  C difficile quick scan w PCR reflex     Status: None   Collection Time: 04/05/16  3:44 PM  Result Value Ref Range Status   C Diff antigen NEGATIVE NEGATIVE Final   C Diff toxin NEGATIVE NEGATIVE Final   C Diff interpretation No C. difficile detected.  Final       Blood Culture    Component Value Date/Time   SDES BLOOD LEFT HAND 03/31/2016 0934   SPECREQUEST BOTTLES DRAWN AEROBIC AND ANAEROBIC 5CC 03/31/2016 0934   CULT NO GROWTH 5 DAYS 03/31/2016 0934   REPTSTATUS 04/05/2016 FINAL 03/31/2016 0934      Labs: Results for orders placed or performed during the hospital encounter of 03/31/16  (from the past 48 hour(s))  C difficile quick scan w PCR reflex     Status: None   Collection Time: 04/05/16  3:44 PM  Result Value Ref Range   C Diff antigen NEGATIVE NEGATIVE   C Diff toxin NEGATIVE NEGATIVE   C Diff interpretation No C. difficile detected.      Lipid Panel     Component Value Date/Time   CHOL 186 01/21/2016 1126   TRIG 97 01/21/2016 1126   HDL 86 01/21/2016 1126   CHOLHDL 2.2 01/21/2016 1126   VLDL 19 01/21/2016 1126   LDLCALC 81 01/21/2016 1126     Lab Results  Component Value Date   HGBA1C 5.8 (H) 06/11/2015     Lab Results  Component Value Date   LDLCALC 81 01/21/2016   CREATININE 0.46 04/03/2016     HPI :  82 y.o.femalewith medical history significant of asthma/COPD on home oxygen 2 L/m continuously, hypothyroidism/goiter, PVCs, HLD, RML cancer status post radiation treatment and said to be in remission, presenting with approximately 2-3 day history of worsening shortness of breath and 1 day history of left lower chest pain. Admitted for COPD exacerbation and pneumonia.  HOSPITAL COURSE:   Acute on chronic hypoxic respiratory failure/COPD exacerbation/community-acquired pneumonia:  Likely multifactorial but predominantly due to COPD exacerbation with left upper and lower lobe pneumonias.  Pulmonary history significant for advanced COPD followed by Dr. Chase Caller and right middle lobe cancer stage I status post SB RT with resolution. Last CT performed July 2017 without evidence of recurrence.  - d/t to unresolved bronchospasm and repeated episode of flare on 10/28, Increased steroids from prednisone to IV Solu-Medrol 60 mg IV every 8 hours. Transition  back to PO steroids 10/30 and an continuous slow outpatient taper Continue to wean oxygen, currently on 3 L, baseline is 2 L of oxygen - Ceftriaxone, azithromycin, no change to Omnicef and azithromycin for another 5 days  influenza PCR, HIV test negative, urine antigen testing never sent - Dr.  Chase Caller notified of patient's admission per patient's request, follow-up with him outpatient - Continue when necessary BiPAP.  - Adjusted bronchodilator regimen.   -Follow-up chest x-ray unchanged   Chest pain: Likely secondary to pleuritic and diaphragmatic irritation from infection. EKG reassuring and troponin  negative. Associated with respirations and movement. - Treatment plan as above Cardiac enzymes negative  - Repeat EKG without acute findings. Chest pain has resolved without recurrence.  Tachycardia and hypotension: Likely secondary to acute stress, hypo-bulimia/dehydration, and medication induced from breathing treatments. prefer Xopenex over albuterol - Telemetry monitoring - Resolved.  Lung cancer - Status post radiation and said to be in remission.  Anemia - Periodically follow CBCs.  Diarrhea  Diarrhea improving, continue probiotic C. difficile negative,     Discharge Exam: *  Blood pressure (!) 125/52, pulse 90, temperature 97.8 F (36.6 C), temperature source Oral, resp. rate 18, height '4\' 10"'$  (1.473 m), weight 54.6 kg (120 lb 6.4 oz), SpO2 97 %. General exam: Pleasant elderly female sitting up at edge of bed Getting ready to eat breakfast this morning, looks much improved compared to yesterday and in no distress. Respiratory system: Improved breath sounds compared to 10/28. Occasional rhonchi posteriorly. No crackles. No increased work of breathing. Cardiovascular system: S1 & S2 heard, RRR. No JVD, murmurs, rubs, gallops or clicks. No pedal edema. Telemetry: Sinus rhythm. Gastrointestinal system: Abdomen is nondistended, soft and nontender. No organomegaly or masses felt. Normal bowel sounds heard. Central nervous system: Alert and oriented. No focal neurological deficits. Extremities: Symmetric 5 x 5 power. Skin: No rashes, lesions or ulcers Psychiatry: Judgement and insight appear normal. Mood & affect appropriate.         SignedReyne Dumas 04/06/2016, 12:02 PM        Time spent >45 mins

## 2016-04-06 NOTE — Progress Notes (Signed)
Physical Therapy Treatment Patient Details Name: Heather Barnes MRN: [id number] DOB: 01-29-1934 Today's Date: 04/06/2016    History of Present Illness Patient is a 82 y/o female with hx of COPD, asthma, non-small cell lung ca of right middle lobe, PNA, HLD presents with 2-3 day history of worsening shortness of breath and 1 day history of left lower chest pain. Admitted for COPD exacerbation and pneumonia.    PT Comments    Patient feeling much better today. Increased ambulation distance and activity while maintaining Sp02 >92% on 3L/min 02. Pt understands her body and able to take rest breaks as needed. Able to perform pursed lip breathing and diaphragmatic breathing as needed. Plans to discharge home today. Will follow if still in hospital.   Follow Up Recommendations  Supervision/Assistance - 24 hour;Supervision for mobility/OOB;Home health PT     Equipment Recommendations  None recommended by PT    Recommendations for Other Services       Precautions / Restrictions Precautions Precautions: Fall Precaution Comments: watch 02 Restrictions Weight Bearing Restrictions: No    Mobility  Bed Mobility Overal bed mobility: Modified Independent             General bed mobility comments: No physical assist needed.   Transfers Overall transfer level: Modified independent Equipment used: None   Sit to Stand: Modified independent (Device/Increase time)         General transfer comment: No assist to stand. Stood from [employer], from chair x2.   Ambulation/Gait Ambulation/Gait assistance: Supervision Ambulation Distance (Feet): 80 Feet (x2 bouts) Assistive device: None Gait Pattern/deviations: Step-through pattern;Decreased stride length Gait velocity: decreased   General Gait Details: Slow, mostly steady gait with cues for pursed lip breathing. Sp02 stayed >92% on 3L/min 02. 1/4 DOE.   Stairs            Wheelchair Mobility    Modified Rankin (Stroke  Patients Only)       Balance Overall balance assessment: Needs assistance Sitting-balance support: Feet supported;No upper extremity supported Sitting balance-Leahy Scale: Good     Standing balance support: During functional activity Standing balance-Leahy Scale: Good                      Cognition Arousal/Alertness: Awake/alert Behavior During Therapy: WFL for tasks assessed/performed Overall Cognitive Status: Within Functional Limits for tasks assessed                      Exercises      General Comments General comments (skin integrity, edema, etc.): Pt demonstrates good understanding of energy conservation techniques.  02 sats on 3L 88% at lowest - pt typically turns 02 up to 3L when she is up and active.  HR to 128 with activity       Pertinent Vitals/Pain Pain Assessment: No/denies pain    Home Living Family/patient expects to be discharged to:: Private residence Living Arrangements: Children Available Help at Discharge: Family;Available 24 hours/day Type of Home: House Home Access: Level entry   Home Layout: One level Home Equipment: Programme researcher, broadcasting/film/video - 4 wheels Additional Comments: chronic 2L O2    Prior Function Level of Independence: Independent with assistive device(s)      Comments: uses transport chair for community; uses rollator for household ambulation (mainly uses the seat). Gets rides from neighbors/friends for appts. Cooks.    PT Goals (current goals can now be found in the care plan section) Acute Rehab PT Goals Patient Stated Goal: to  get stronger  Progress towards PT goals: Progressing toward goals    Frequency    Min 3X/week      PT Plan Current plan remains appropriate    Co-evaluation             End of Session Equipment Utilized During Treatment: Oxygen Activity Tolerance: Patient tolerated treatment well Patient left: in bed;with call bell/phone within reach     Time: 6808-8110 PT Time  Calculation (min) (ACUTE ONLY): 17 min  Charges:  $Gait Training: 8-22 mins                    G Codes:      Jehieli Brassell A Rosalind Guido 04/06/2016, 1:13 PM Wray Kearns, [place], DPT [phone]

## 2016-04-06 NOTE — Evaluation (Signed)
Occupational Therapy Evaluation Patient Details Name: Heather Barnes MRN: [id number] DOB: 1933/11/29 Today's Date: 04/06/2016    History of Present Illness Patient is a 82 y/o female with hx of COPD, asthma, non-small cell lung ca of right middle lobe, PNA, HLD presents with 2-3 day history of worsening shortness of breath and 1 day history of left lower chest pain. Admitted for COPD exacerbation and pneumonia.   Clinical Impression   Patient evaluated by Occupational Therapy with no further acute OT needs identified. All education has been completed and the patient has no further questions. Pt is mod I with ADLs and demonstrates good understanding of  Energy conservation techniques.  She typically uses 2L supplemental 02 at home at all times, and 3L when she is active.   Sats on 3L decreased briefly to 88% and rebounded to mid 90s with pursed lip breathing.  HR max 128 with activity.   See below for any follow-up Occupational Therapy or equipment needs. OT is signing off. Thank you for this referral.      Follow Up Recommendations  No OT follow up;Supervision/Assistance - 24 hour    Equipment Recommendations  Tub/shower bench (Pt will acquire )    Recommendations for Other Services       Precautions / Restrictions Precautions Precautions: Fall Precaution Comments: watch 02      Mobility Bed Mobility Overal bed mobility: Modified Independent                Transfers Overall transfer level: Modified independent Equipment used: None             General transfer comment: assist for line management only     Balance Overall balance assessment: No apparent balance deficits (not formally assessed)                                          ADL Overall ADL's : Modified independent                                       General ADL Comments: requires assist for line management.  Discussed options for tub DME - recommend tub  transfer bench.  She reports she will shop around and have family acquire one       Vision     Perception     Praxis      Pertinent Vitals/Pain Pain Assessment: No/denies pain     Hand Dominance Right   Extremity/Trunk Assessment Upper Extremity Assessment Upper Extremity Assessment: Generalized weakness   Lower Extremity Assessment Lower Extremity Assessment: Defer to PT evaluation       Communication Communication Communication: HOH   Cognition Arousal/Alertness: Awake/alert Behavior During Therapy: WFL for tasks assessed/performed Overall Cognitive Status: Within Functional Limits for tasks assessed                     General Comments       Exercises       Shoulder Instructions      Home Living Family/patient expects to be discharged to:: Private residence Living Arrangements: Children Available Help at Discharge: Family;Available 24 hours/day Type of Home: House Home Access: Level entry     Home Layout: One level     Bathroom Shower/Tub: Tub/shower unit Shower/tub characteristics: Architectural technologist: Standard  Home Equipment: Programme researcher, broadcasting/film/video - 4 wheels   Additional Comments: chronic 2L O2      Prior Functioning/Environment Level of Independence: Independent with assistive device(s)        Comments: uses transport chair for community; uses rollator for household ambulation (mainly uses the seat). Gets rides from neighbors/friends for appts. Cooks.         OT Problem List: Decreased strength;Cardiopulmonary status limiting activity   OT Treatment/Interventions:      OT Goals(Current goals can be found in the care plan section) Acute Rehab OT Goals Patient Stated Goal: to get stronger  OT Goal Formulation: All assessment and education complete, DC therapy  OT Frequency:     Barriers to D/C:            Co-evaluation              End of Session Equipment Utilized During Treatment: Oxygen Nurse  Communication: Mobility status  Activity Tolerance: Patient tolerated treatment well Patient left: in bed;with call bell/phone within reach   Time: 1208-1246 OT Time Calculation (min): 38 min Charges:  OT General Charges $OT Visit: 1 Procedure OT Evaluation $OT Eval Low Complexity: 1 Procedure OT Treatments $Self Care/Home Management : 23-37 mins G-Codes:    Darlen Gledhill M 05-02-16, 1:00 PM

## 2016-04-06 NOTE — Progress Notes (Signed)
Patient being discharged home per MD order. All discharge instructions given to patient in written form.

## 2016-04-07 DIAGNOSIS — J441 Chronic obstructive pulmonary disease with (acute) exacerbation: Secondary | ICD-10-CM | POA: Diagnosis not present

## 2016-04-07 DIAGNOSIS — E785 Hyperlipidemia, unspecified: Secondary | ICD-10-CM | POA: Diagnosis not present

## 2016-04-07 DIAGNOSIS — Z87891 Personal history of nicotine dependence: Secondary | ICD-10-CM | POA: Diagnosis not present

## 2016-04-07 DIAGNOSIS — C342 Malignant neoplasm of middle lobe, bronchus or lung: Secondary | ICD-10-CM | POA: Diagnosis not present

## 2016-04-07 DIAGNOSIS — J189 Pneumonia, unspecified organism: Secondary | ICD-10-CM | POA: Diagnosis not present

## 2016-04-07 DIAGNOSIS — E039 Hypothyroidism, unspecified: Secondary | ICD-10-CM | POA: Diagnosis not present

## 2016-04-07 DIAGNOSIS — J440 Chronic obstructive pulmonary disease with acute lower respiratory infection: Secondary | ICD-10-CM | POA: Diagnosis not present

## 2016-04-07 DIAGNOSIS — Z9981 Dependence on supplemental oxygen: Secondary | ICD-10-CM | POA: Diagnosis not present

## 2016-04-07 DIAGNOSIS — M810 Age-related osteoporosis without current pathological fracture: Secondary | ICD-10-CM | POA: Diagnosis not present

## 2016-04-07 DIAGNOSIS — J45909 Unspecified asthma, uncomplicated: Secondary | ICD-10-CM | POA: Diagnosis not present

## 2016-04-08 ENCOUNTER — Telehealth: Payer: Self-pay | Admitting: Internal Medicine

## 2016-04-08 ENCOUNTER — Other Ambulatory Visit: Payer: Self-pay | Admitting: Pharmacist

## 2016-04-08 ENCOUNTER — Other Ambulatory Visit: Payer: Self-pay | Admitting: *Deleted

## 2016-04-08 NOTE — Patient Outreach (Signed)
[facility] [facility] Care Management  04/08/2016  [place] 10/22/1933 [id number]   CSW made an initial attempt to try and contact patient today to perform phone assessment, as well as assess and assist with social needs and services, without success.  A HIPAA complaint message was left for patient on voicemail.  CSW is currently awaiting a return call.  In the meantime, CSW has mailed patient a List of [employer], along with CSW's business card.  CSW has encouraged patient to contact CSW directly if patient has questions or needs assistance with making referrals for services.  Otherwise, CSW will refrain from opening patient's case at this time. Nat Christen, BSW, MSW, LCSW  Licensed Education officer, [facility]  Mailing [place] [address] [place], [place] [address] Physical Address-[address], [place],  [address] Toll Free Main # [phone] Fax # [phone] Cell # [phone]  Fax # [phone]  [email or web address]

## 2016-04-08 NOTE — Patient Outreach (Signed)
[facility]) Care Management  [place]   04/08/2016  Briana Farner [facility] Jun 03, 1934 [id number]  Subjective: 82 year old female referred to [place] for medication assistance due to being in coverage gap for "most of the year."  Patient has past medical history of COPD, lung cancer and was discharged on 04/06/16 with pneumonia.  Today via telephone patient states that she has $130 copay for Breo Ellipta and Incruse Ellipta due to being in the medicare coverage gap which she cannot afford.  Patient states she lives with her daughter who is not working/doesnt have income and patient is responsible for both of their housing and utilities.  Patient does receive social security income as her only source of income.    Patient states she does still have both inhalers because she has been receiving samples from her pulmonologist.    Objective:   Encounter Medications: Outpatient Encounter Prescriptions as of 04/08/2016  Medication Sig  . albuterol (PROVENTIL HFA;VENTOLIN HFA) 108 (90 Base) MCG/ACT inhaler Inhale 2 puffs into the lungs every 6 (six) hours as needed for wheezing or shortness of breath.  [name] azithromycin (ZITHROMAX) 500 MG tablet Take 1 tablet (500 mg total) by mouth daily.  . cefdinir (OMNICEF) 300 MG capsule Take 1 capsule (300 mg total) by mouth 2 (two) times daily.  . cetirizine (ZYRTEC) 10 MG tablet Take 10 mg by mouth daily.    . Cholecalciferol (VITAMIN D) 2000 UNITS CAPS Take 1 capsule by mouth 2 (two) times daily.   . fluticasone (FLONASE) 50 MCG/ACT nasal spray Place 2 sprays into the nose as needed for allergies or rhinitis.   . fluticasone furoate-vilanterol (BREO ELLIPTA) 100-25 MCG/INH AEPB Inhale 1 puff into the lungs daily.  [name] guaiFENesin (MUCINEX) 600 MG 12 hr tablet Take 2 tablets (1,200 mg total) by mouth 2 (two) times daily.  . INCRUSE ELLIPTA 62.5 MCG/INH AEPB INHALE ONE PUFF ONCE DAILY  . Omega-3 Fatty Acids (FISH OIL) 1000 MG CAPS Take  1,000 mg by mouth at bedtime.   . pantoprazole (PROTONIX) 40 MG tablet Take 1 tablet (40 mg total) by mouth daily.  . predniSONE (DELTASONE) 10 MG tablet 6 tablets 4 days, 5 tablets 4 days, 4 tablets 4 days, 3 tablets 4 days, 2 tablets 4 days, 1 tablet 4 days, half a tablet 4 days  . saccharomyces boulardii (FLORASTOR) 250 MG capsule Take 1 capsule (250 mg total) by mouth 2 (two) times daily.  [name] SINGULAIR 10 MG tablet Take 10 mg by mouth at bedtime.    No facility-administered encounter medications on file as of 04/08/2016.     Functional Status: In your present state of health, do you have any difficulty performing the following activities: 03/31/2016 06/11/2015  Hearing? N Y  Vision? Y N  Difficulty concentrating or making decisions? N N  Walking or climbing stairs? N Y  Dressing or bathing? N N  Doing errands, shopping? N N  Some recent data might be hidden    Assessment: Medication Assistance: Medicare Part D patient that states she is in the coverage gap. She has social security income and her daughter lives with her that also has multiple medical problems (patient likely supports >50% of resources including housing/utilities).  Patient was recently hospitalized for pneumonia and has very severe COPD  Plan: Completed low income subsidy/extra help application for patient electronically and patient will receive the results via mail. Patient may be eligible for [employer] patient assistance for Breo Ellipta and Incruse Ellipta  if she has spent >$600 out of pocket in 2017.  Asked patient to obtain documentation of out of pocket spend from [place] Will reach out to her pulmonologist to see if she can receive samples to get her through the rest of the year.   Will followup medication assistance in 1 week via telephone.   Bennye Alm, PharmD [facility] PGY2 Pharmacy Resident [phone]

## 2016-04-08 NOTE — Telephone Encounter (Signed)
Called and spoke to pt. I Informed pt that MR was unaware of hospitalization until today and wished her well. Pt very appreciative. Nothing further needed.

## 2016-04-08 NOTE — Telephone Encounter (Signed)
Pls call Heather Barnes know that only 04/08/2016 I found out about 0/25/17 admit. Please wish her well  Thanks  Dr. Brand Males, M.D., [facility].C.P Pulmonary and Critical Care Medicine Staff Physician [place] [facility] Pager: [phone], If no answer or between  15:00h - 7:00h: call [phone]  04/08/2016 11:43 AM

## 2016-04-09 ENCOUNTER — Telehealth: Payer: Self-pay | Admitting: Internal Medicine

## 2016-04-09 ENCOUNTER — Other Ambulatory Visit: Payer: Self-pay | Admitting: Pharmacist

## 2016-04-09 DIAGNOSIS — M810 Age-related osteoporosis without current pathological fracture: Secondary | ICD-10-CM | POA: Diagnosis not present

## 2016-04-09 DIAGNOSIS — J440 Chronic obstructive pulmonary disease with acute lower respiratory infection: Secondary | ICD-10-CM | POA: Diagnosis not present

## 2016-04-09 DIAGNOSIS — C342 Malignant neoplasm of middle lobe, bronchus or lung: Secondary | ICD-10-CM | POA: Diagnosis not present

## 2016-04-09 DIAGNOSIS — J189 Pneumonia, unspecified organism: Secondary | ICD-10-CM | POA: Diagnosis not present

## 2016-04-09 DIAGNOSIS — J45909 Unspecified asthma, uncomplicated: Secondary | ICD-10-CM | POA: Diagnosis not present

## 2016-04-09 DIAGNOSIS — J441 Chronic obstructive pulmonary disease with (acute) exacerbation: Secondary | ICD-10-CM | POA: Diagnosis not present

## 2016-04-09 MED ORDER — FLUTICASONE FUROATE-VILANTEROL 100-25 MCG/INH IN AEPB: 1.0000 | INHALATION_SPRAY | Freq: Every day | RESPIRATORY_TRACT | 0 refills | Status: AC

## 2016-04-09 NOTE — Patient Outreach (Signed)
[facility]) Care Management  04/09/2016  [place] 1933/12/26 [id number]   Called to notify patient that Adair Patter sample is ready to be picked up at [facility] office.  Patient states that she has 9 day left of her Memory [place] and Incruse but her family member is going to pick some up from the pharmacy for her.  Plan Will followup patient assistance application next week  Bennye Alm, PharmD [facility] PGY2 Pharmacy Resident [phone]

## 2016-04-09 NOTE — Patient Outreach (Signed)
[place] [facility]) Care Management  04/09/2016  [place] [birth year] [id number]   Request received from Nat Christen, LCSW to mail patient financial resource information. Information mailed today, 04/09/16  Josepha Pigg, [place] Management Assistant

## 2016-04-09 NOTE — Telephone Encounter (Signed)
Called and spoke with [facility] with [facility] and he stated that he is trying to get the pt set up with pt assistance for the breo and the incruse.  We have samples of the breo, but none of the incruse.

## 2016-04-12 DIAGNOSIS — J440 Chronic obstructive pulmonary disease with acute lower respiratory infection: Secondary | ICD-10-CM | POA: Diagnosis not present

## 2016-04-12 DIAGNOSIS — M810 Age-related osteoporosis without current pathological fracture: Secondary | ICD-10-CM | POA: Diagnosis not present

## 2016-04-12 DIAGNOSIS — J441 Chronic obstructive pulmonary disease with (acute) exacerbation: Secondary | ICD-10-CM | POA: Diagnosis not present

## 2016-04-12 DIAGNOSIS — J189 Pneumonia, unspecified organism: Secondary | ICD-10-CM | POA: Diagnosis not present

## 2016-04-12 DIAGNOSIS — C342 Malignant neoplasm of middle lobe, bronchus or lung: Secondary | ICD-10-CM | POA: Diagnosis not present

## 2016-04-12 DIAGNOSIS — J45909 Unspecified asthma, uncomplicated: Secondary | ICD-10-CM | POA: Diagnosis not present

## 2016-04-14 ENCOUNTER — Other Ambulatory Visit: Payer: Self-pay | Admitting: Pharmacist

## 2016-04-14 DIAGNOSIS — J440 Chronic obstructive pulmonary disease with acute lower respiratory infection: Secondary | ICD-10-CM | POA: Diagnosis not present

## 2016-04-14 DIAGNOSIS — J189 Pneumonia, unspecified organism: Secondary | ICD-10-CM | POA: Diagnosis not present

## 2016-04-14 DIAGNOSIS — J441 Chronic obstructive pulmonary disease with (acute) exacerbation: Secondary | ICD-10-CM | POA: Diagnosis not present

## 2016-04-14 DIAGNOSIS — M810 Age-related osteoporosis without current pathological fracture: Secondary | ICD-10-CM | POA: Diagnosis not present

## 2016-04-14 DIAGNOSIS — J45909 Unspecified asthma, uncomplicated: Secondary | ICD-10-CM | POA: Diagnosis not present

## 2016-04-14 DIAGNOSIS — C342 Malignant neoplasm of middle lobe, bronchus or lung: Secondary | ICD-10-CM | POA: Diagnosis not present

## 2016-04-14 NOTE — Patient Outreach (Signed)
[facility]) Care Management  04/14/2016  Kalii Chesmore Aug 07, 1933 [id number]   82 year old female referred to [place] for medication assistance with Breo Ellipta and Incruse Ellipta. Last week I completed low income subsidy/extra help application and today I followed up to see if she obtained 2017 out of pocket medication cost from [facility].  Patient states she is still awaiting for this document in the mail and she has not yet received the low income subsidy letter either.  Patient states she called Dr [facility] office and there were no Breo Ellipta samples there waiting for her to pickup.    Plan: Will followup Low income subsidy/extra help approval letter results, documentation of out of pocket medication costs, and [place] patient assistance application in 1 week via telephone -Will contact Dr [facility] office to confirm Adair Patter samples are waiting for patient to pick up during her pulmonology visit next Tuesday.  Bennye Alm, PharmD [facility] PGY2 Pharmacy Resident [phone]

## 2016-04-16 ENCOUNTER — Other Ambulatory Visit: Payer: Self-pay | Admitting: Pharmacist

## 2016-04-16 DIAGNOSIS — J189 Pneumonia, unspecified organism: Secondary | ICD-10-CM | POA: Diagnosis not present

## 2016-04-16 DIAGNOSIS — C342 Malignant neoplasm of middle lobe, bronchus or lung: Secondary | ICD-10-CM | POA: Diagnosis not present

## 2016-04-16 DIAGNOSIS — J45909 Unspecified asthma, uncomplicated: Secondary | ICD-10-CM | POA: Diagnosis not present

## 2016-04-16 DIAGNOSIS — J440 Chronic obstructive pulmonary disease with acute lower respiratory infection: Secondary | ICD-10-CM | POA: Diagnosis not present

## 2016-04-16 DIAGNOSIS — J441 Chronic obstructive pulmonary disease with (acute) exacerbation: Secondary | ICD-10-CM | POA: Diagnosis not present

## 2016-04-16 DIAGNOSIS — M810 Age-related osteoporosis without current pathological fracture: Secondary | ICD-10-CM | POA: Diagnosis not present

## 2016-04-19 DIAGNOSIS — J441 Chronic obstructive pulmonary disease with (acute) exacerbation: Secondary | ICD-10-CM | POA: Diagnosis not present

## 2016-04-19 DIAGNOSIS — J440 Chronic obstructive pulmonary disease with acute lower respiratory infection: Secondary | ICD-10-CM | POA: Diagnosis not present

## 2016-04-19 DIAGNOSIS — C342 Malignant neoplasm of middle lobe, bronchus or lung: Secondary | ICD-10-CM | POA: Diagnosis not present

## 2016-04-19 DIAGNOSIS — J45909 Unspecified asthma, uncomplicated: Secondary | ICD-10-CM | POA: Diagnosis not present

## 2016-04-19 DIAGNOSIS — M810 Age-related osteoporosis without current pathological fracture: Secondary | ICD-10-CM | POA: Diagnosis not present

## 2016-04-19 DIAGNOSIS — J189 Pneumonia, unspecified organism: Secondary | ICD-10-CM | POA: Diagnosis not present

## 2016-04-20 ENCOUNTER — Inpatient Hospital Stay: Payer: Medicare Other | Admitting: Adult Health

## 2016-04-21 ENCOUNTER — Other Ambulatory Visit: Payer: Self-pay | Admitting: Pharmacist

## 2016-04-21 DIAGNOSIS — J189 Pneumonia, unspecified organism: Secondary | ICD-10-CM | POA: Diagnosis not present

## 2016-04-21 DIAGNOSIS — M549 Dorsalgia, unspecified: Secondary | ICD-10-CM | POA: Diagnosis not present

## 2016-04-21 DIAGNOSIS — J441 Chronic obstructive pulmonary disease with (acute) exacerbation: Secondary | ICD-10-CM | POA: Diagnosis not present

## 2016-04-22 DIAGNOSIS — M810 Age-related osteoporosis without current pathological fracture: Secondary | ICD-10-CM | POA: Diagnosis not present

## 2016-04-22 DIAGNOSIS — J189 Pneumonia, unspecified organism: Secondary | ICD-10-CM | POA: Diagnosis not present

## 2016-04-22 DIAGNOSIS — C342 Malignant neoplasm of middle lobe, bronchus or lung: Secondary | ICD-10-CM | POA: Diagnosis not present

## 2016-04-22 DIAGNOSIS — J441 Chronic obstructive pulmonary disease with (acute) exacerbation: Secondary | ICD-10-CM | POA: Diagnosis not present

## 2016-04-22 DIAGNOSIS — J45909 Unspecified asthma, uncomplicated: Secondary | ICD-10-CM | POA: Diagnosis not present

## 2016-04-22 DIAGNOSIS — J440 Chronic obstructive pulmonary disease with acute lower respiratory infection: Secondary | ICD-10-CM | POA: Diagnosis not present

## 2016-04-24 ENCOUNTER — Emergency Department (HOSPITAL_COMMUNITY): Payer: Medicare Other

## 2016-04-24 ENCOUNTER — Emergency Department (HOSPITAL_COMMUNITY)
Admission: EM | Admit: 2016-04-24 | Discharge: 2016-04-24 | Disposition: A | Discharge status: Home / Self Care | Payer: Medicare Other | Attending: Emergency Medicine | Admitting: Emergency Medicine

## 2016-04-24 ENCOUNTER — Telehealth: Payer: Self-pay | Admitting: Internal Medicine

## 2016-04-24 ENCOUNTER — Encounter (HOSPITAL_COMMUNITY): Payer: Self-pay

## 2016-04-24 DIAGNOSIS — M545 Low back pain, unspecified: Secondary | ICD-10-CM

## 2016-04-24 DIAGNOSIS — Z791 Long term (current) use of non-steroidal anti-inflammatories (NSAID): Secondary | ICD-10-CM | POA: Diagnosis not present

## 2016-04-24 DIAGNOSIS — Z91040 Latex allergy status: Secondary | ICD-10-CM | POA: Insufficient documentation

## 2016-04-24 DIAGNOSIS — Y999 Unspecified external cause status: Secondary | ICD-10-CM | POA: Insufficient documentation

## 2016-04-24 DIAGNOSIS — Z79899 Other long term (current) drug therapy: Secondary | ICD-10-CM | POA: Diagnosis not present

## 2016-04-24 DIAGNOSIS — Y929 Unspecified place or not applicable: Secondary | ICD-10-CM | POA: Diagnosis not present

## 2016-04-24 DIAGNOSIS — S22080A Wedge compression fracture of T11-T12 vertebra, initial encounter for closed fracture: Secondary | ICD-10-CM | POA: Diagnosis not present

## 2016-04-24 DIAGNOSIS — Z87891 Personal history of nicotine dependence: Secondary | ICD-10-CM | POA: Insufficient documentation

## 2016-04-24 DIAGNOSIS — S299XXA Unspecified injury of thorax, initial encounter: Secondary | ICD-10-CM | POA: Diagnosis present

## 2016-04-24 DIAGNOSIS — Y939 Activity, unspecified: Secondary | ICD-10-CM | POA: Diagnosis not present

## 2016-04-24 DIAGNOSIS — J45909 Unspecified asthma, uncomplicated: Secondary | ICD-10-CM | POA: Diagnosis not present

## 2016-04-24 DIAGNOSIS — X58XXXA Exposure to other specified factors, initial encounter: Secondary | ICD-10-CM | POA: Insufficient documentation

## 2016-04-24 DIAGNOSIS — J449 Chronic obstructive pulmonary disease, unspecified: Secondary | ICD-10-CM | POA: Diagnosis not present

## 2016-04-24 DIAGNOSIS — S22000A Wedge compression fracture of unspecified thoracic vertebra, initial encounter for closed fracture: Secondary | ICD-10-CM

## 2016-04-24 DIAGNOSIS — Z85118 Personal history of other malignant neoplasm of bronchus and lung: Secondary | ICD-10-CM | POA: Diagnosis not present

## 2016-04-24 MED ORDER — METHOCARBAMOL 500 MG PO TABS
500.0000 mg | ORAL_TABLET | Freq: Once | ORAL | Status: AC
Start: 2016-04-24 — End: 2016-04-24
  Administered 2016-04-24: 500 mg via ORAL
  Filled 2016-04-24: qty 1

## 2016-04-24 MED ORDER — TRAMADOL HCL 50 MG PO TABS: 50.0000 mg | ORAL_TABLET | Freq: Four times a day (QID) | ORAL | 0 refills | Status: DC | PRN

## 2016-04-24 MED ORDER — TRAMADOL HCL 50 MG PO TABS
50.0000 mg | ORAL_TABLET | Freq: Once | ORAL | Status: AC
  Administered 2016-04-24: 50 mg via ORAL
  Filled 2016-04-24: qty 1

## 2016-04-24 MED ORDER — METHOCARBAMOL 500 MG PO TABS: 500.0000 mg | ORAL_TABLET | Freq: Three times a day (TID) | ORAL | 0 refills | Status: DC | PRN

## 2016-04-24 NOTE — ED Triage Notes (Signed)
Pt presents with c/o back pain that she reports started before she was admitted for pneumonia in October. Pt reports that the pain is not related to any recent injury and that the pain is in the center of her back. Pt reports a hx of lung cancer and COPD, is on oxygen at all times at home.

## 2016-04-24 NOTE — Telephone Encounter (Signed)
Heather Barnes having low back pain -severe since Monday 04/19/16. Tuesday PCP [name],[name], MD x-ra nd told gas and given laxative. Called ER and was advised miralax.Going to toilet but severe back pain is still present. Taking ES tylenol wihtout relief. Unable to bend. No problems with urine. Oxyugenationa nd respiration good. Noted admit 10/25 -10/31/1-7 for CAP. No change in respiratory status currently.   Advise: ER - concern for fracture spine. - [facility] or [place]. She will go to [facility] ER. D/w Dr Ashok Cordia of [facility]   Dr. Brand Males, M.D., F.C.C.P Pulmonary and Critical Care Medicine Staff Physician [place] [facility] Pager: [phone], If no answer or between  15:00h - 7:00h: call [phone]  04/24/2016 7:51 AM

## 2016-04-24 NOTE — ED Provider Notes (Signed)
[facility] DEPT Provider Note   CSN: [id number] Arrival date & time: 04/24/16  7628     History   Chief Complaint Chief Complaint  Patient presents with  . Back Pain    HPI Heather Barnes is a 82 y.o. female.  Patient c/o low back pain for the past several days. Gradual onset, pain is constant, dull, mod-severe, non radiating. Pain is worse w certain positional changes and movements, including bending at waist. No leg numbness/weakness. Denies fever or chills. No incontinence/retention. Denies hx chronic back pain. No hx ddd or compression fracture. No abd pain.   The history is provided by the patient.  Back Pain   Pertinent negatives include no fever, no numbness, no abdominal pain, no dysuria and no weakness.    Past Medical History:  Diagnosis Date  . Allergy    seasonal pollen  . Asthma   . Cataract    bilat.cataract/lens implant  . Colon adenomas   . COPD (chronic obstructive pulmonary disease) ([place])   . Goiter   . History of radiation therapy 05/24/11-06/02/11   R middle lobe lung/ 60 gray  . Hx of colonic polyps   . Hyperlipidemia   . Lung cancer, middle lobe ([place]) 04/26/2011  . Osteoporosis   . Palpitations 01/19/2016  . PVC (premature ventricular contraction) 02/24/2016  . Shortness of breath     Patient Active Problem List   Diagnosis Date Noted  . Acute respiratory failure ([place]) 03/31/2016  . CAP (community acquired pneumonia) 03/31/2016  . Chest pain 03/31/2016  . Tachycardia 03/31/2016  . PVC (premature ventricular contraction) 02/24/2016  . Palpitations 01/19/2016  . Respiratory failure ([place]) 06/11/2015  . Acute on chronic respiratory failure ([place]) 06/11/2015  . Knee pain, right 07/02/2014  . Chronic respiratory failure with hypoxia ([place]) 07/02/2014  . COPD, very severe ([place]) 07/02/2014  . COPD exacerbation ([place]) 06/13/2013  . Right rib fracture 05/15/2012  . Fatigue 07/22/2011  . Oral thrush 07/22/2011  . Lung cancer, middle lobe  ([place]) 04/26/2011  . COPD (chronic obstructive pulmonary disease) ([place]) 01/13/2011    Past Surgical History:  Procedure Laterality Date  . BIOPSY THYROID  2016  . COLONOSCOPY W/ BIOPSIES    . EYE SURGERY    . TONSILLECTOMY    . TOTAL ABDOMINAL HYSTERECTOMY      OB History    No data available       Home Medications    Prior to Admission medications   Medication Sig Start Date End Date Taking? Authorizing Provider  acetaminophen (TYLENOL) 500 MG tablet Take 1,000 mg by mouth every 6 (six) hours as needed for moderate pain.   Yes Historical Provider, MD  albuterol (PROVENTIL HFA;VENTOLIN HFA) 108 (90 Base) MCG/ACT inhaler Inhale 2 puffs into the lungs every 6 (six) hours as needed for wheezing or shortness of breath.   Yes Historical Provider, MD  Capsaicin-Menthol (SALONPAS GEL EX) Apply 1 application topically 2 (two) times daily as needed (to help with pain (allows her to go to sleep)).   Yes Historical Provider, MD  cetirizine (ZYRTEC) 10 MG tablet Take 10 mg by mouth daily.     Yes Historical Provider, MD  Cholecalciferol (VITAMIN D) 2000 UNITS CAPS Take 1 capsule by mouth 2 (two) times daily.    Yes Historical Provider, MD  fluticasone (FLONASE) 50 MCG/ACT nasal spray Place 2 sprays into the nose as needed for allergies or rhinitis.    Yes Historical Provider, MD  fluticasone furoate-vilanterol (BREO ELLIPTA) 100-25 MCG/INH  AEPB Inhale 1 puff into the lungs daily. 08/12/15  Yes Brand Males, MD  INCRUSE ELLIPTA 62.5 MCG/INH AEPB INHALE ONE PUFF ONCE DAILY 01/02/16  Yes Brand Males, MD  Omega-3 Fatty Acids (FISH OIL) 1000 MG CAPS Take 1,000 mg by mouth at bedtime.    Yes Historical Provider, MD  predniSONE (DELTASONE) 10 MG tablet 6 tablets 4 days, 5 tablets 4 days, 4 tablets 4 days, 3 tablets 4 days, 2 tablets 4 days, 1 tablet 4 days, half a tablet 4 days 04/06/16  Yes Reyne Dumas, MD  SINGULAIR 10 MG tablet Take 10 mg by mouth at bedtime.  12/29/10  Yes Historical  Provider, MD  pantoprazole (PROTONIX) 40 MG tablet Take 1 tablet (40 mg total) by mouth daily. Patient not taking: Reported on 04/24/2016 04/06/16   Reyne Dumas, MD    Family History Family History  Problem Relation Age of Onset  . Emphysema Maternal Uncle   . Heart disease Mother   . Heart disease Maternal Grandfather   . Rheum arthritis Sister   . Cancer Sister   . Cancer Sister     throat cancer  . Brain cancer Sister   . Cancer Sister   . Ovarian cancer Sister   . Breast cancer Daughter     Social History Social History  Substance Use Topics  . Smoking status: Former Smoker    Packs/day: 0.50    Years: 40.00    Types: Cigarettes    Quit date: 06/07/1998  . Smokeless tobacco: Never Used  . Alcohol use No     Allergies   Tiotropium bromide monohydrate; Codeine; Fosamax [alendronate]; Asa [aspirin]; and Latex   Review of Systems Review of Systems  Constitutional: Negative for fever.  Gastrointestinal: Negative for abdominal pain and vomiting.  Genitourinary: Negative for difficulty urinating and dysuria.  Musculoskeletal: Positive for back pain.  Skin: Negative for rash.  Neurological: Negative for weakness and numbness.     Physical Exam Updated Vital Signs BP 124/96   Pulse 92   Temp 97.9 F (36.6 C) (Oral)   Resp 16   SpO2 100%   Physical Exam  Constitutional: She appears well-developed and well-nourished. No distress.  Eyes: Conjunctivae are normal. No scleral icterus.  Neck: Neck supple. No tracheal deviation present.  Cardiovascular: Normal rate and intact distal pulses.   Pulmonary/Chest: Effort normal. No respiratory distress.  Abdominal: Normal appearance. She exhibits no distension and no mass. There is no tenderness. There is no rebound and no guarding. No hernia.  No puls mass.   Genitourinary:  Genitourinary Comments: No cva tenderness  Musculoskeletal: She exhibits no edema.  Upper lumbar tenderness, esp right, otherwise, TLS spine  non tender, aligned.   Neurological: She is alert.  Motor intact bil. stre 5/5. sens grossly intact. Straight leg raise neg.   Skin: Skin is warm and dry. No rash noted. She is not diaphoretic.  No shingles/rash in area of pain.   Psychiatric: She has a normal mood and affect.  Nursing note and vitals reviewed.    ED Treatments / Results  Labs (all labs ordered are listed, but only abnormal results are displayed) Labs Reviewed - No data to display  EKG  EKG Interpretation None       Radiology Dg Lumbar Spine Complete  Result Date: 04/24/2016 CLINICAL DATA:  Chronic back pain.  No known injury. EXAM: LUMBAR SPINE - COMPLETE 4+ VIEW COMPARISON:  Chest CT 12/23/2015. FINDINGS: Diffuse osteopenia. Mild compression fracture at T10, stable when  compared to prior chest CT. Slight wedged appearance of the T12 vertebral body, likely early compression fracture, new since prior chest CT. Disc spaces are maintained. No subluxation. IMPRESSION: Mild chronic compression fracture at T10. Slight wedged appearance of the T12 vertebral body, likely slight compression fracture. Electronically Signed   By: Rolm Baptise M.D.   On: 04/24/2016 10:32    Procedures Procedures (including critical care time)  Medications Ordered in ED Medications  traMADol (ULTRAM) tablet 50 mg (50 mg Oral Given 04/24/16 1026)  methocarbamol (ROBAXIN) tablet 500 mg (500 mg Oral Given 04/24/16 1026)     Initial Impression / Assessment and Plan / ED Course  I have reviewed the triage vital signs and the nursing notes.  Pertinent labs & imaging results that were available during my care of the patient were reviewed by me and considered in my medical decision making (see chart for details).  Clinical Course    Ultram po. Robaxin po.  Xrays.  Discussed results w pt.  Pain improved.   Patient currently appears stable for d/c.     Final Clinical Impressions(s) / ED Diagnoses   Final diagnoses:  None     New Prescriptions New Prescriptions   No medications on file     Lajean Saver, MD 04/24/16 1139

## 2016-04-24 NOTE — Discharge Instructions (Signed)
It was our pleasure to provide your ER care today - we hope that you feel better.  Your xrays were read as follows, discussed with your doctor at follow up: IMPRESSION: Mild chronic compression fracture at T10.  Slight wedged appearance of the T12 vertebral body, likely slight compression fracture.  Rest. Avoid bending at waist, or any heavy lifting > 5 lbs for the next week.  Heat to sore area.  You may try taking ibuprofen as need for pain.  You may also take ultram as need for pain, and robaxin as need for muscle spasm - these medications may cause drowsiness, no driving when taking.   Follow up with primary care doctor in 1 week.  Return to ER if worse, new symptoms, fevers, intractable pain, numbness/weakness, other concern.

## 2016-04-27 ENCOUNTER — Telehealth: Payer: Self-pay | Admitting: Internal Medicine

## 2016-04-27 ENCOUNTER — Emergency Department (HOSPITAL_COMMUNITY)
Admission: EM | Admit: 2016-04-27 | Discharge: 2016-04-27 | Disposition: A | Discharge status: Home / Self Care | Payer: Medicare Other | Attending: Emergency Medicine | Admitting: Emergency Medicine

## 2016-04-27 ENCOUNTER — Encounter (HOSPITAL_COMMUNITY): Payer: Self-pay | Admitting: Emergency Medicine

## 2016-04-27 ENCOUNTER — Other Ambulatory Visit: Payer: Self-pay | Admitting: Pharmacist

## 2016-04-27 DIAGNOSIS — Y929 Unspecified place or not applicable: Secondary | ICD-10-CM | POA: Insufficient documentation

## 2016-04-27 DIAGNOSIS — Z85118 Personal history of other malignant neoplasm of bronchus and lung: Secondary | ICD-10-CM | POA: Insufficient documentation

## 2016-04-27 DIAGNOSIS — J449 Chronic obstructive pulmonary disease, unspecified: Secondary | ICD-10-CM | POA: Diagnosis not present

## 2016-04-27 DIAGNOSIS — Z87891 Personal history of nicotine dependence: Secondary | ICD-10-CM | POA: Diagnosis not present

## 2016-04-27 DIAGNOSIS — Y939 Activity, unspecified: Secondary | ICD-10-CM | POA: Diagnosis not present

## 2016-04-27 DIAGNOSIS — Y999 Unspecified external cause status: Secondary | ICD-10-CM | POA: Insufficient documentation

## 2016-04-27 DIAGNOSIS — X58XXXA Exposure to other specified factors, initial encounter: Secondary | ICD-10-CM | POA: Insufficient documentation

## 2016-04-27 DIAGNOSIS — S299XXA Unspecified injury of thorax, initial encounter: Secondary | ICD-10-CM | POA: Diagnosis present

## 2016-04-27 DIAGNOSIS — S22009A Unspecified fracture of unspecified thoracic vertebra, initial encounter for closed fracture: Secondary | ICD-10-CM | POA: Diagnosis not present

## 2016-04-27 DIAGNOSIS — Z79899 Other long term (current) drug therapy: Secondary | ICD-10-CM | POA: Insufficient documentation

## 2016-04-27 DIAGNOSIS — S22080D Wedge compression fracture of T11-T12 vertebra, subsequent encounter for fracture with routine healing: Secondary | ICD-10-CM | POA: Diagnosis not present

## 2016-04-27 DIAGNOSIS — Z91040 Latex allergy status: Secondary | ICD-10-CM | POA: Diagnosis not present

## 2016-04-27 DIAGNOSIS — S22000A Wedge compression fracture of unspecified thoracic vertebra, initial encounter for closed fracture: Secondary | ICD-10-CM

## 2016-04-27 MED ORDER — KETOROLAC TROMETHAMINE 15 MG/ML IJ SOLN
15.0000 mg | Freq: Once | INTRAMUSCULAR | Status: AC
  Administered 2016-04-27: 15 mg via INTRAMUSCULAR
  Filled 2016-04-27: qty 1

## 2016-04-27 MED ORDER — OXYCODONE-ACETAMINOPHEN 5-325 MG PO TABS
1.0000 | ORAL_TABLET | Freq: Once | ORAL | Status: AC
  Administered 2016-04-27: 1 via ORAL
  Filled 2016-04-27: qty 1

## 2016-04-27 NOTE — Patient Outreach (Addendum)
[facility]) Care Management  [place]   04/16/2016  [place] March 05, 1934 [id number]  Subjective: 82 year old female referred to [place] for medication assistance with Adair Patter and Incruse Ellipta due to her being in the Medicare Coverage Gap.  Today during pharmacy home visit patient has provided me with printout from [place] showing her total out of pocket cost for medications in 2017 and income verification for patient assistance application.   Objective:   Encounter Medications: Outpatient Encounter Prescriptions as of 04/16/2016  Medication Sig Note  . albuterol (PROVENTIL HFA;VENTOLIN HFA) 108 (90 Base) MCG/ACT inhaler Inhale 2 puffs into the lungs every 6 (six) hours as needed for wheezing or shortness of breath.   . cetirizine (ZYRTEC) 10 MG tablet Take 10 mg by mouth daily.     . Cholecalciferol (VITAMIN D) 2000 UNITS CAPS Take 2,000 Units by mouth 2 (two) times daily.    . fluticasone (FLONASE) 50 MCG/ACT nasal spray Place 2 sprays into the nose daily as needed for allergies or rhinitis.    . fluticasone furoate-vilanterol (BREO ELLIPTA) 100-25 MCG/INH AEPB Inhale 1 puff into the lungs daily.   . [EXPIRED] fluticasone furoate-vilanterol (BREO ELLIPTA) 100-25 MCG/INH AEPB Inhale 1 puff into the lungs daily.   . [EXPIRED] guaiFENesin (MUCINEX) 600 MG 12 hr tablet Take 2 tablets (1,200 mg total) by mouth 2 (two) times daily.   . INCRUSE ELLIPTA 62.5 MCG/INH AEPB INHALE ONE PUFF ONCE DAILY   . Omega-3 Fatty Acids (FISH OIL) 1000 MG CAPS Take 1,000 mg by mouth at bedtime.    . pantoprazole (PROTONIX) 40 MG tablet Take 1 tablet (40 mg total) by mouth daily. (Patient not taking: Reported on 04/24/2016)   . predniSONE (DELTASONE) 10 MG tablet 6 tablets 4 days, 5 tablets 4 days, 4 tablets 4 days, 3 tablets 4 days, 2 tablets 4 days, 1 tablet 4 days, half a tablet 4 days 04/24/2016: Pt is currently on the 2 tablets for 4 days part of  tx  . [EXPIRED] saccharomyces boulardii (FLORASTOR) 250 MG capsule Take 1 capsule (250 mg total) by mouth 2 (two) times daily.   [name] SINGULAIR 10 MG tablet Take 10 mg by mouth at bedtime.     No facility-administered encounter medications on file as of 04/16/2016.     Functional Status: In your present state of health, do you have any difficulty performing the following activities: 03/31/2016 06/11/2015  Hearing? N Y  Vision? Y N  Difficulty concentrating or making decisions? N N  Walking or climbing stairs? N Y  Dressing or bathing? N N  Doing errands, shopping? N N  Some recent data might be hidden    Fall/Depression Screening: No flowsheet data found.  Assessment: Medication Assistance: Medicare patient currently in the coverage gap and cannot afford Breo Ellipta or Incruse Ellipta. Application for low income subsidy/extra help was completed last week. Home visit to complete patient portion of [place] patient assistance application was completed today. Dr Golden Pop office has left Breo Ellipta samples for patient to pick up at his office front desk.   Plan: Patient to pick up samples of Breo Ellipta from Dr Golden Pop office during her providers office visit next week.  Completed patient portion of [place] patient assistance application for Ms Prange and will fax provider portion to Dr Golden Pop office Will followup with patient about results of low income subsidy/extra help application next week.   Bennye Alm, PharmD [facility] PGY2 Pharmacy Resident [phone]

## 2016-04-27 NOTE — Telephone Encounter (Signed)
MR please advise if you are ok to do these prescriptions and when you will be back in the office to sign these. thanks

## 2016-04-27 NOTE — Telephone Encounter (Signed)
Patient daughter calling back again, she states that she is in a lot of pain - She can be reached at [phone] -pr

## 2016-04-27 NOTE — ED Provider Notes (Signed)
[facility] [employer] DEPT Provider Note   CSN: [id number] Arrival date & time: 04/27/16  1642 By signing my name Barnes, Heather Barnes, Heather Barnes, Heather that this documentation has been prepared under the direction and in the presence of Virgel Manifold, MD. Electronically Signed: Georgette Barnes, Heather Scribe. 04/27/16. 5:49 PM.  History   Barnes Complaint Barnes Complaint  Patient presents with  . Back Pain   HPI Comments: Heather Barnes is a 82 y.o. female with h/o lung cancer who presents to the Emergency Department complaining of 10/10 back pain onset 4 days ago with associated constipation. Pt reports pain is exacerbated with movement and positional changes. She was seen here on 04/24/16 and diagnosed with a compression fracture. She was prescribed Tramadol and Robaxin with no relief. Since then, pt has had increased pain. Pt notes she called her PCP today who prescribed her pain medication but pt states she has not taken it due to the side effects. Pt denies fever, chills, or any other associated symptoms at this time.   The history is provided by the patient. No language interpreter was used.    Past Medical History:  Diagnosis Date  . Allergy    seasonal pollen  . Asthma   . Cataract    bilat.cataract/lens implant  . Colon adenomas   . COPD (chronic obstructive pulmonary disease) ([place])   . Goiter   . History of radiation therapy 05/24/11-06/02/11   R middle lobe lung/ 60 gray  . Hx of colonic polyps   . Hyperlipidemia   . Lung cancer, middle lobe ([place]) 04/26/2011  . Osteoporosis   . Palpitations 01/19/2016  . PVC (premature ventricular contraction) 02/24/2016  . Shortness of breath     Patient Active Problem List   Diagnosis Date Noted  . Acute respiratory failure ([place]) 03/31/2016  . CAP (community acquired pneumonia) 03/31/2016  . Chest pain 03/31/2016  . Tachycardia 03/31/2016  . PVC (premature ventricular contraction) 02/24/2016  . Palpitations 01/19/2016  . Respiratory failure ([place])  06/11/2015  . Acute on chronic respiratory failure ([place]) 06/11/2015  . Knee pain, right 07/02/2014  . Chronic respiratory failure with hypoxia ([place]) 07/02/2014  . COPD, very severe ([place]) 07/02/2014  . COPD exacerbation ([place]) 06/13/2013  . Right rib fracture 05/15/2012  . Fatigue 07/22/2011  . Oral thrush 07/22/2011  . Lung cancer, middle lobe ([place]) 04/26/2011  . COPD (chronic obstructive pulmonary disease) ([place]) 01/13/2011    Past Surgical History:  Procedure Laterality Date  . BIOPSY THYROID  2016  . COLONOSCOPY W/ BIOPSIES    . EYE SURGERY    . TONSILLECTOMY    . TOTAL ABDOMINAL HYSTERECTOMY      OB History    No data available       Home Medications    Prior to Admission medications   Medication Sig Start Date End Date Taking? Authorizing Provider  acetaminophen (TYLENOL) 500 MG tablet Take 1,000 mg by mouth every 6 (six) hours as needed for moderate pain.    Historical Provider, MD  albuterol (PROVENTIL HFA;VENTOLIN HFA) 108 (90 Base) MCG/ACT inhaler Inhale 2 puffs into the lungs every 6 (six) hours as needed for wheezing or shortness of breath.    Historical Provider, MD  Capsaicin-Menthol (SALONPAS GEL EX) Apply 1 application topically 2 (two) times daily as needed (to help with pain (allows her to go to sleep)).    Historical Provider, MD  cetirizine (ZYRTEC) 10 MG tablet Take 10 mg by mouth daily.      Historical  Provider, MD  Cholecalciferol (VITAMIN D) 2000 UNITS CAPS Take 2,000 Units by mouth 2 (two) times daily.     Historical Provider, MD  fluticasone (FLONASE) 50 MCG/ACT nasal spray Place 2 sprays into the nose daily as needed for allergies or rhinitis.     Historical Provider, MD  fluticasone furoate-vilanterol (BREO ELLIPTA) 100-25 MCG/INH AEPB Inhale 1 puff into the lungs daily. 08/12/15   Brand Males, MD  INCRUSE ELLIPTA 62.5 MCG/INH AEPB INHALE ONE PUFF ONCE DAILY 01/02/16   Brand Males, MD  methocarbamol (ROBAXIN) 500 MG tablet Take 1 tablet (500 mg  total) by mouth every 8 (eight) hours as needed for muscle spasms. 04/24/16   Lajean Saver, MD  Omega-3 Fatty Acids (FISH OIL) 1000 MG CAPS Take 1,000 mg by mouth at bedtime.     Historical Provider, MD  pantoprazole (PROTONIX) 40 MG tablet Take 1 tablet (40 mg total) by mouth daily. Patient not taking: Reported on 04/24/2016 04/06/16   Reyne Dumas, MD  predniSONE (DELTASONE) 10 MG tablet 6 tablets 4 days, 5 tablets 4 days, 4 tablets 4 days, 3 tablets 4 days, 2 tablets 4 days, 1 tablet 4 days, half a tablet 4 days 04/06/16   Reyne Dumas, MD  SINGULAIR 10 MG tablet Take 10 mg by mouth at bedtime.  12/29/10   Historical Provider, MD  traMADol (ULTRAM) 50 MG tablet Take 1 tablet (50 mg total) by mouth every 6 (six) hours as needed. 04/24/16   Lajean Saver, MD    Family History Family History  Problem Relation Age of Onset  . Emphysema Maternal Uncle   . Heart disease Mother   . Heart disease Maternal Grandfather   . Rheum arthritis Sister   . Cancer Sister   . Cancer Sister     throat cancer  . Brain cancer Sister   . Cancer Sister   . Ovarian cancer Sister   . Breast cancer Daughter     Social History Social History  Substance Use Topics  . Smoking status: Former Smoker    Packs/day: 0.50    Years: 40.00    Types: Cigarettes    Quit date: 06/07/1998  . Smokeless tobacco: Never Used  . Alcohol use No     Allergies   Tiotropium bromide monohydrate; Codeine; Fosamax [alendronate]; Asa [aspirin]; and Latex   Review of Systems Review of Systems  Constitutional: Negative for chills and fever.  Gastrointestinal: Positive for constipation.  Musculoskeletal: Positive for back pain.  All other systems reviewed and are negative.    Physical Exam Updated Vital Signs BP 127/76 (BP Location: Right Arm)   Pulse 93   Temp 97.8 F (36.6 C) (Oral)   Resp 20   SpO2 95%   Physical Exam  Constitutional: She is oriented to person, place, and time. She appears well-developed  and well-nourished. No distress.  HENT:  Head: Normocephalic and atraumatic.  Nose: Nose normal.  Mouth/Throat: Oropharynx is clear and moist. No oropharyngeal exudate.  Eyes: Conjunctivae and EOM are normal. Pupils are equal, round, and reactive to light. No scleral icterus.  Neck: Normal range of motion. Neck supple. No JVD present. No tracheal deviation present. No thyromegaly present.  Cardiovascular: Normal rate, regular rhythm and normal heart sounds.  Exam reveals no gallop and no friction rub.   No murmur heard. Pulmonary/Chest: Effort normal and breath sounds normal. No respiratory distress. She has no wheezes. She exhibits no tenderness.  Abdominal: Soft. Bowel sounds are normal. She exhibits no distension and no  mass. There is no tenderness. There is no rebound and no guarding.  Musculoskeletal: Normal range of motion. She exhibits no edema or tenderness.  Lymphadenopathy:    She has no cervical adenopathy.  Neurological: She is alert and oriented to person, place, and time. No cranial nerve deficit. She exhibits normal muscle tone.  Skin: Skin is warm and dry. No rash noted. No erythema. No pallor.  Nursing note and vitals reviewed.    Heather Treatments / Results  DIAGNOSTIC STUDIES: Oxygen Saturation is 95% on [place] 3L, adequate by my interpretation.    COORDINATION OF CARE: 5:49 PM Discussed treatment plan with pt at bedside which includes pain medication monitoring and pt agreed to plan.  Labs (all labs ordered are listed, but only abnormal results are displayed) Labs Reviewed - No data to display  EKG  EKG Interpretation None       Radiology No results found.  Procedures Procedures (including critical care time)  Medications Ordered in Heather Medications  oxyCODONE-acetaminophen (PERCOCET/ROXICET) 5-325 MG per tablet 1 tablet (1 tablet Oral Given 04/27/16 1814)  ketorolac (TORADOL) 15 MG/ML injection 15 mg (15 mg Intramuscular Given 04/27/16 1816)     Initial  Impression / Assessment and Plan / Heather Course  Heather Barnes have reviewed the triage vital signs and the nursing notes.  Pertinent labs & imaging results that were available during my care of the patient were reviewed by me and considered in my medical decision making (see chart for details).  Clinical Course    82yF with back pain from compression fx. No acute neuro complaints. Matter of pain control. Not sure if this is something that would be amenable to kyphoplasty. FU as outpt.   Final Clinical Impressions(s) / Heather Diagnoses   Final diagnoses:  Compression fracture of body of thoracic vertebra [facility])    New Prescriptions Discharge Medication List as of 04/27/2016  7:59 PM    Heather Barnes personally preformed the services scribed in my presence. The recorded information has been reviewed is accurate. Virgel Manifold, MD.     Virgel Manifold, MD 05/06/16 [phone]

## 2016-04-27 NOTE — ED Notes (Signed)
Bed: WA07 Expected date:  Expected time:  Means of arrival:  Comments: Hold triage 2

## 2016-04-27 NOTE — Patient Outreach (Signed)
[facility]) Care Management  04/21/2016  Heather Barnes 1933/08/19 [id number]   82 year old female referred to [place] for medication assistance with Breo Ellipta and Incruse Ellipta. Last week I completed low income subsidy/extra help application and patient portion of [place] application for Breo Ellipta and Incruse Ellipta.  Today via telephone patient states she has received notification via mail that she has been denied extra help/low income subsidy. [facility] pharmacy has not received paper prescription fax from Dr Golden Pop office.  Patient has not yet picked up Breo Ellipta samples from Dr Golden Pop office.  Assessment: Medication Assistance: Medicare patient currently in the coverage gap and cannot afford Breo Ellipta or Incruse Ellipta. Application for low income subsidy/extra help has been denied.  [place] pharmacy and patient have completed patient portion of [place] patient assistance applicationy. Dr Golden Pop office has left Breo Ellipta samples for patient to pick up at his office front desk.   Plan: Patient to pick up samples of Breo Ellipta from Dr Golden Pop office during her next office visit or before if she runs out.  Have refaxed provider portion to Dr [facility] office Will followup fax from Dr Golden Pop office with prescriptions next week.   Bennye Alm, PharmD [facility] PGY2 Pharmacy Resident [phone]

## 2016-04-27 NOTE — Patient Outreach (Signed)
[place] [facility]) Care Management  04/27/2016  Jamiah Homeyer 1933/07/10 [id number]    82 year old female referred to [place] for medication assistance with Breo Ellipta and Incruse Ellipta. Called Dr Golden Pop office today to request Adair Patter and Iantha Fallen signed paper prescription to be faxed to [place] in order to complete [place] patient assistance application. Talked with one of his nurses and they plan to send the prescription over as soon as possible.  Plan: Will followup patient assistance next week and submit [place] application once signed and faxed prescriptions are sent  Bennye Alm, PharmD [facility] PGY2 Pharmacy Resident [phone]

## 2016-04-27 NOTE — ED Triage Notes (Signed)
Per pt, states was here on Saturday for same symptoms-was diagnosed with compression fracture-states increased pain-called PCP and they prescribed pain medicine-states she didn't take because of the side effects-states she is also constipted

## 2016-04-27 NOTE — Telephone Encounter (Signed)
Spoke with pt and her daughter, Heather Barnes. States that pt went to the ED and was diagnosed with a compression fracture in her back. She was given Robaxin and Tramadol for the pain. Pt states that these medications are not helping with the pain. Advised her and her daughter that she needs to see an orthopedist about this matter. Pt keep insinuating that she would need more pain meds, advised her that we are her lung doctor and she would need to see ortho if she wanted more pain medication. Her daughter states that she will set up an appointment for her ASAP with an orthopedist. Nothing further was needed at this time.

## 2016-04-28 ENCOUNTER — Other Ambulatory Visit: Payer: Self-pay

## 2016-04-28 MED ORDER — FLUTICASONE FUROATE-VILANTEROL 100-25 MCG/INH IN AEPB: 1.0000 | INHALATION_SPRAY | Freq: Every day | RESPIRATORY_TRACT | 11 refills | Status: DC

## 2016-04-28 MED ORDER — UMECLIDINIUM BROMIDE 62.5 MCG/INH IN AEPB: 1.0000 | INHALATION_SPRAY | Freq: Every day | RESPIRATORY_TRACT | 11 refills | Status: DC

## 2016-04-28 NOTE — Telephone Encounter (Signed)
I will be swinging by 04/28/2016 for a rsearch eval. You can get me to sign then around 11am. If not, then afte thanksiving next week

## 2016-04-28 NOTE — Telephone Encounter (Signed)
Rx has been printed and will have MR sign after thanksgiving.

## 2016-04-29 DIAGNOSIS — J189 Pneumonia, unspecified organism: Secondary | ICD-10-CM | POA: Diagnosis not present

## 2016-04-29 DIAGNOSIS — C342 Malignant neoplasm of middle lobe, bronchus or lung: Secondary | ICD-10-CM | POA: Diagnosis not present

## 2016-04-29 DIAGNOSIS — J440 Chronic obstructive pulmonary disease with acute lower respiratory infection: Secondary | ICD-10-CM | POA: Diagnosis not present

## 2016-04-29 DIAGNOSIS — M810 Age-related osteoporosis without current pathological fracture: Secondary | ICD-10-CM | POA: Diagnosis not present

## 2016-04-29 DIAGNOSIS — J45909 Unspecified asthma, uncomplicated: Secondary | ICD-10-CM | POA: Diagnosis not present

## 2016-04-29 DIAGNOSIS — J441 Chronic obstructive pulmonary disease with (acute) exacerbation: Secondary | ICD-10-CM | POA: Diagnosis not present

## 2016-05-04 ENCOUNTER — Other Ambulatory Visit: Payer: Self-pay | Admitting: Pharmacist

## 2016-05-04 DIAGNOSIS — M4854XA Collapsed vertebra, not elsewhere classified, thoracic region, initial encounter for fracture: Secondary | ICD-10-CM | POA: Diagnosis not present

## 2016-05-04 NOTE — Telephone Encounter (Signed)
Daneil Dan please update when this has been signed by MR.  thanks

## 2016-05-04 NOTE — Telephone Encounter (Signed)
Rx has been signed and faxed. Called and spoke to Heather Barnes and informed him that the rx has been faxed to [facility]. Heather Barnes verbalized understanding and denied any further questions or concerns at this time.

## 2016-05-05 DIAGNOSIS — J45909 Unspecified asthma, uncomplicated: Secondary | ICD-10-CM | POA: Diagnosis not present

## 2016-05-05 DIAGNOSIS — J440 Chronic obstructive pulmonary disease with acute lower respiratory infection: Secondary | ICD-10-CM | POA: Diagnosis not present

## 2016-05-05 DIAGNOSIS — J189 Pneumonia, unspecified organism: Secondary | ICD-10-CM | POA: Diagnosis not present

## 2016-05-05 DIAGNOSIS — C342 Malignant neoplasm of middle lobe, bronchus or lung: Secondary | ICD-10-CM | POA: Diagnosis not present

## 2016-05-05 DIAGNOSIS — M810 Age-related osteoporosis without current pathological fracture: Secondary | ICD-10-CM | POA: Diagnosis not present

## 2016-05-05 DIAGNOSIS — J441 Chronic obstructive pulmonary disease with (acute) exacerbation: Secondary | ICD-10-CM | POA: Diagnosis not present

## 2016-05-05 NOTE — Patient Outreach (Signed)
[place] [facility]) Care Management  05/05/2016  Lucas 12-09-1933 [id number]  Received call from Dr Golden Pop nurse that the prescriptions for Breo and Iantha Fallen had been sent to [place].  Patient assistance application has been faxed to [place] patient assistance program.  Plan: Will followup patient assistance in 1-2 weeks   Bennye Alm, PharmD [facility] PGY2 Pharmacy Resident [phone]

## 2016-05-06 DIAGNOSIS — J45909 Unspecified asthma, uncomplicated: Secondary | ICD-10-CM | POA: Diagnosis not present

## 2016-05-06 DIAGNOSIS — C342 Malignant neoplasm of middle lobe, bronchus or lung: Secondary | ICD-10-CM | POA: Diagnosis not present

## 2016-05-06 DIAGNOSIS — J189 Pneumonia, unspecified organism: Secondary | ICD-10-CM | POA: Diagnosis not present

## 2016-05-06 DIAGNOSIS — M810 Age-related osteoporosis without current pathological fracture: Secondary | ICD-10-CM | POA: Diagnosis not present

## 2016-05-06 DIAGNOSIS — J440 Chronic obstructive pulmonary disease with acute lower respiratory infection: Secondary | ICD-10-CM | POA: Diagnosis not present

## 2016-05-06 DIAGNOSIS — J441 Chronic obstructive pulmonary disease with (acute) exacerbation: Secondary | ICD-10-CM | POA: Diagnosis not present

## 2016-05-07 DIAGNOSIS — C342 Malignant neoplasm of middle lobe, bronchus or lung: Secondary | ICD-10-CM | POA: Diagnosis not present

## 2016-05-07 DIAGNOSIS — M810 Age-related osteoporosis without current pathological fracture: Secondary | ICD-10-CM | POA: Diagnosis not present

## 2016-05-07 DIAGNOSIS — J45909 Unspecified asthma, uncomplicated: Secondary | ICD-10-CM | POA: Diagnosis not present

## 2016-05-07 DIAGNOSIS — J189 Pneumonia, unspecified organism: Secondary | ICD-10-CM | POA: Diagnosis not present

## 2016-05-07 DIAGNOSIS — J441 Chronic obstructive pulmonary disease with (acute) exacerbation: Secondary | ICD-10-CM | POA: Diagnosis not present

## 2016-05-07 DIAGNOSIS — J440 Chronic obstructive pulmonary disease with acute lower respiratory infection: Secondary | ICD-10-CM | POA: Diagnosis not present

## 2016-05-10 ENCOUNTER — Ambulatory Visit (INDEPENDENT_AMBULATORY_CARE_PROVIDER_SITE_OTHER)
Admission: RE | Admit: 2016-05-10 | Discharge: 2016-05-10 | Disposition: A | Discharge status: Home / Self Care | Payer: Medicare Other | Source: Ambulatory Visit | Attending: Internal Medicine | Admitting: Internal Medicine

## 2016-05-10 ENCOUNTER — Encounter: Payer: Self-pay | Admitting: Internal Medicine

## 2016-05-10 ENCOUNTER — Ambulatory Visit (INDEPENDENT_AMBULATORY_CARE_PROVIDER_SITE_OTHER): Payer: Medicare Other | Admitting: Internal Medicine

## 2016-05-10 VITALS — BP 118/60 | HR 86 | Ht <= 58 in | Wt 114.8 lb

## 2016-05-10 DIAGNOSIS — J180 Bronchopneumonia, unspecified organism: Secondary | ICD-10-CM | POA: Diagnosis not present

## 2016-05-10 DIAGNOSIS — J9611 Chronic respiratory failure with hypoxia: Secondary | ICD-10-CM | POA: Diagnosis not present

## 2016-05-10 DIAGNOSIS — J449 Chronic obstructive pulmonary disease, unspecified: Secondary | ICD-10-CM

## 2016-05-10 DIAGNOSIS — I259 Chronic ischemic heart disease, unspecified: Secondary | ICD-10-CM | POA: Diagnosis not present

## 2016-05-10 DIAGNOSIS — R0602 Shortness of breath: Secondary | ICD-10-CM | POA: Diagnosis not present

## 2016-05-10 DIAGNOSIS — R05 Cough: Secondary | ICD-10-CM | POA: Diagnosis not present

## 2016-05-10 DIAGNOSIS — M5440 Lumbago with sciatica, unspecified side: Secondary | ICD-10-CM

## 2016-05-10 NOTE — Progress Notes (Signed)
Subjective:     Patient ID: Heather Barnes, female   DOB: Mar 15, 1934, 82 y.o.   MRN: [id number]  HPI  OV 05/10/2016  Chief Complaint  Patient presents with  . Follow-up    Pt here for 23monthf/u. Pt states she had pna in October but states she feel back to baseline. Pt c/o mild dry cough, PND. Pt denies CP/tightness.    Follow-up Gold stage IV COPD with chronic hypoxic respiratory failure  82year old female. This is a follow-up from summer 2017. In October 2017 she was admitted for left upper lobe pneumonia. Chest x-ray was reviewed. She is back to stay on triple inhaler therapy. She continues on oxygen. Overall she's stable. There are no new issues with COPD standpoint. In the interim she's developed low back pain without any bladder or bowel disturbance. She's had 2 emergency department visits 04/24/2016 and 04/27/2016. The second one did a chest x-ray and she has T10 and T12 wedge compression fracture. This is severe and disabling her. She has an orthopedic appointment in a few days. She is taking appropriate pain medications.   has a past medical history of Allergy; Asthma; Cataract; Colon adenomas; COPD [place] obstructive [place] disease) [place]; Goiter; History of radiation therapy (05/24/11-06/02/11); colonic polyps; Hyperlipidemia; Lung cancer, middle lobe ([place] (04/26/2011); Osteoporosis; Palpitations (01/19/2016); PVC (premature ventricular contraction) (02/24/2016); and Shortness of breath.   reports that she quit smoking about 17 years ago. Her smoking use included Cigarettes. She has a 20.00 pack-year smoking history. She has never used smokeless tobacco.  Past Surgical History:  Procedure Laterality Date  . BIOPSY THYROID  2016  . COLONOSCOPY W/ BIOPSIES    . EYE SURGERY    . TONSILLECTOMY    . TOTAL ABDOMINAL HYSTERECTOMY      Allergies  Allergen Reactions  . Tiotropium Bromide Monohydrate Hives, Shortness Of Breath and Rash    Spiriva   . Codeine Other (See  Comments)    Hyperactivity,crying  . Fosamax [Alendronate] Other (See Comments)    Muscle aches  . Asa [Aspirin] Other (See Comments)    Bleeding -ulcers  . Latex Rash    Immunization History  Administered Date(s) Administered  . Influenza Split 04/07/2013, 03/07/2014  . Influenza Whole 02/06/2011, 03/07/2012  . Influenza, High Dose Seasonal PF 03/07/2016  . Influenza,inj,Quad PF,36+ Mos 01/27/2015  . Influenza-Unspecified 01/05/2014  . Pneumococcal Conjugate-13 10/05/2013  . Pneumococcal Polysaccharide-23 06/08/2003  . Pneumococcal-Unspecified 11/05/2012    Family History  Problem Relation Age of Onset  . Emphysema Maternal Uncle   . Heart disease Mother   . Heart disease Maternal Grandfather   . Rheum arthritis Sister   . Cancer Sister   . Cancer Sister     throat cancer  . Brain cancer Sister   . Cancer Sister   . Ovarian cancer Sister   . Breast cancer Daughter      Current Outpatient Prescriptions:  .  acetaminophen (TYLENOL) 500 MG tablet, Take 1,000 mg by mouth every 6 (six) hours as needed for moderate pain., Disp: , Rfl:  .  albuterol (PROVENTIL HFA;VENTOLIN HFA) 108 (90 Base) MCG/ACT inhaler, Inhale 2 puffs into the lungs every 6 (six) hours as needed for wheezing or shortness of breath., Disp: , Rfl:  .  cetirizine (ZYRTEC) 10 MG tablet, Take 10 mg by mouth daily.  , Disp: , Rfl:  .  Cholecalciferol (VITAMIN D) 2000 UNITS CAPS, Take 2,000 Units by mouth 2 (two) times daily. , Disp: , Rfl:  .  fluticasone (FLONASE) 50 MCG/ACT nasal spray, Place 2 sprays into the nose daily as needed for allergies or rhinitis. , Disp: , Rfl:  .  fluticasone furoate-vilanterol (BREO ELLIPTA) 100-25 MCG/INH AEPB, Inhale 1 puff into the lungs daily., Disp: 60 each, Rfl: 11 .  HYDROcodone-acetaminophen (NORCO/VICODIN) 5-325 MG tablet, Take 1 tablet by mouth every 6 (six) hours as needed for moderate pain., Disp: , Rfl:  .  Omega-3 Fatty Acids (FISH OIL) 1000 MG CAPS, Take 1,000 mg by  mouth at bedtime. , Disp: , Rfl:  .  SINGULAIR 10 MG tablet, Take 10 mg by mouth at bedtime. , Disp: , Rfl:  .  umeclidinium bromide (INCRUSE ELLIPTA) 62.5 MCG/INH AEPB, Inhale 1 puff into the lungs daily., Disp: 30 each, Rfl: 11 .  methocarbamol (ROBAXIN) 500 MG tablet, Take 1 tablet (500 mg total) by mouth every 8 (eight) hours as needed for muscle spasms. (Patient not taking: Reported on 05/10/2016), Disp: 20 tablet, Rfl: 0    Review of Systems     Objective:   Physical Exam  Constitutional: She is oriented to person, place, and time. She appears well-developed and well-nourished. No distress.  Looks to bein chronic pain  HENT:  Head: Normocephalic and atraumatic.  Right Ear: External ear normal.  Left Ear: External ear normal.  Mouth/Throat: Oropharynx is clear and moist. No oropharyngeal exudate.  o2 on  Eyes: Conjunctivae and EOM are normal. Pupils are equal, round, and reactive to light. Right eye exhibits no discharge. Left eye exhibits no discharge. No scleral icterus.  Neck: Normal range of motion. Neck supple. No JVD present. No tracheal deviation present. No thyromegaly present.  Cardiovascular: Normal rate, regular rhythm, normal heart sounds and intact distal pulses.  Exam reveals no gallop and no friction rub.   No murmur heard. Pulmonary/Chest: Effort normal and breath sounds normal. No respiratory distress. She has no wheezes. She has no rales. She exhibits no tenderness.  barrell chest Purse lip breathing  Abdominal: Soft. Bowel sounds are normal. She exhibits no distension and no mass. There is no tenderness. There is no rebound and no guarding.  Musculoskeletal: Normal range of motion. She exhibits no edema or tenderness.  Lymphadenopathy:    She has no cervical adenopathy.  Neurological: She is alert and oriented to person, place, and time. She has normal reflexes. No cranial nerve deficit. She exhibits normal muscle tone. Coordination normal.  Skin: Skin is warm  and dry. No rash noted. She is not diaphoretic. No erythema. No pallor.  Psychiatric: She has a normal mood and affect. Her behavior is normal. Judgment and thought content normal.  Vitals reviewed.   Vitals:   05/10/16 0904  BP: 118/60  Pulse: 86  SpO2: 98%  Weight: 114 lb 12.8 oz (52.1 kg)  Height: '4\' 10"'$  (1.473 m)    Estimated body mass index is 23.99 kg/m as calculated from the following:   Height as of this encounter: '4\' 10"'$  (1.473 m).   Weight as of this encounter: 114 lb 12.8 oz (52.1 kg).      Assessment:       ICD-9-CM ICD-10-CM   1. COPD, very severe ([place]) 496 J44.9   2. Chronic respiratory failure with hypoxia (HCC) 518.83 J96.11    799.02    3. Bronchopneumonia 485 J18.0   4. Bilateral low back pain with sciatica, sciatica laterality unspecified, unspecified chronicity 724.3 M54.40         Plan:     COPD, very severe ([place]) Chronic  respiratory failure with hypoxia (HCC) - copd stable Continue o2, incruse , dulera as before - take samples if we have any Monitor o2 levels - adjust it to keep it > 86% atleast    Bronchopneumonia - new left lung oct 2017  - do cxr portable 05/10/2016 - will call with results   Back pain  - please ask ortho doctor if MRI indicated esp with your history of cancer of lung  followup 3 months or sooner if neede  Dr. Brand Males, M.D., [facility].C.P Pulmonary and Critical Care Medicine Staff Physician [place] [facility] Pager: [phone], If no answer or between  15:00h - 7:00h: call [phone]  05/10/2016 9:24 AM

## 2016-05-10 NOTE — Patient Instructions (Signed)
COPD, very severe ([place]) Chronic respiratory failure with hypoxia ([place]) - copd stable Continue o2, incruse , dulera as before - take samples if we have any Monitor o2 levels - adjust it to keep it > 86% atleast    Bronchopneumonia - new left lung oct 2017  - do cxr portable 05/10/2016 - will call with results   Back pain  - please ask ortho doctor if MRI indicated esp with your history of cancer of lung  followup 3 months or sooner if neede

## 2016-05-11 DIAGNOSIS — J45909 Unspecified asthma, uncomplicated: Secondary | ICD-10-CM | POA: Diagnosis not present

## 2016-05-11 DIAGNOSIS — C342 Malignant neoplasm of middle lobe, bronchus or lung: Secondary | ICD-10-CM | POA: Diagnosis not present

## 2016-05-11 DIAGNOSIS — M810 Age-related osteoporosis without current pathological fracture: Secondary | ICD-10-CM | POA: Diagnosis not present

## 2016-05-11 DIAGNOSIS — J189 Pneumonia, unspecified organism: Secondary | ICD-10-CM | POA: Diagnosis not present

## 2016-05-11 DIAGNOSIS — J441 Chronic obstructive pulmonary disease with (acute) exacerbation: Secondary | ICD-10-CM | POA: Diagnosis not present

## 2016-05-11 DIAGNOSIS — J440 Chronic obstructive pulmonary disease with acute lower respiratory infection: Secondary | ICD-10-CM | POA: Diagnosis not present

## 2016-05-12 ENCOUNTER — Ambulatory Visit (INDEPENDENT_AMBULATORY_CARE_PROVIDER_SITE_OTHER): Payer: Medicare Other | Admitting: Sports Medicine

## 2016-05-12 VITALS — BP 112/53 | HR 77 | Ht <= 58 in | Wt 114.0 lb

## 2016-05-12 DIAGNOSIS — J449 Chronic obstructive pulmonary disease, unspecified: Secondary | ICD-10-CM

## 2016-05-12 DIAGNOSIS — S22000A Wedge compression fracture of unspecified thoracic vertebra, initial encounter for closed fracture: Secondary | ICD-10-CM

## 2016-05-12 DIAGNOSIS — I259 Chronic ischemic heart disease, unspecified: Secondary | ICD-10-CM

## 2016-05-12 DIAGNOSIS — C342 Malignant neoplasm of middle lobe, bronchus or lung: Secondary | ICD-10-CM | POA: Diagnosis not present

## 2016-05-12 MED ORDER — HYDROCODONE-ACETAMINOPHEN 5-325 MG PO TABS: 1.0000 | ORAL_TABLET | Freq: Three times a day (TID) | ORAL | 0 refills | Status: DC

## 2016-05-13 DIAGNOSIS — J45909 Unspecified asthma, uncomplicated: Secondary | ICD-10-CM | POA: Diagnosis not present

## 2016-05-13 DIAGNOSIS — J440 Chronic obstructive pulmonary disease with acute lower respiratory infection: Secondary | ICD-10-CM | POA: Diagnosis not present

## 2016-05-13 DIAGNOSIS — J441 Chronic obstructive pulmonary disease with (acute) exacerbation: Secondary | ICD-10-CM | POA: Diagnosis not present

## 2016-05-13 DIAGNOSIS — M810 Age-related osteoporosis without current pathological fracture: Secondary | ICD-10-CM | POA: Diagnosis not present

## 2016-05-13 DIAGNOSIS — J189 Pneumonia, unspecified organism: Secondary | ICD-10-CM | POA: Diagnosis not present

## 2016-05-13 DIAGNOSIS — C342 Malignant neoplasm of middle lobe, bronchus or lung: Secondary | ICD-10-CM | POA: Diagnosis not present

## 2016-05-17 DIAGNOSIS — J441 Chronic obstructive pulmonary disease with (acute) exacerbation: Secondary | ICD-10-CM | POA: Diagnosis not present

## 2016-05-17 DIAGNOSIS — C342 Malignant neoplasm of middle lobe, bronchus or lung: Secondary | ICD-10-CM | POA: Diagnosis not present

## 2016-05-17 DIAGNOSIS — J440 Chronic obstructive pulmonary disease with acute lower respiratory infection: Secondary | ICD-10-CM | POA: Diagnosis not present

## 2016-05-17 DIAGNOSIS — J45909 Unspecified asthma, uncomplicated: Secondary | ICD-10-CM | POA: Diagnosis not present

## 2016-05-17 DIAGNOSIS — M810 Age-related osteoporosis without current pathological fracture: Secondary | ICD-10-CM | POA: Diagnosis not present

## 2016-05-17 DIAGNOSIS — J189 Pneumonia, unspecified organism: Secondary | ICD-10-CM | POA: Diagnosis not present

## 2016-05-17 NOTE — Progress Notes (Signed)
Heather Barnes - 82 y.o. female MRN [id number]  Date of birth: 07/21/1933  Office Visit Note: Visit Date: 05/12/2016 PCP: [name], MD Referred by: Aretta Nip, MD  Subjective: Chief Complaint  Patient presents with  . Spine - New Patient (Initial Visit)  . Compression Fracture    Patient states she is not sure what happened.   Patient ambulates in a wheelchair.     HPI: Patient reports insidious onset of severe acute pain in the low back. Previously seen in emergency department where x-rays were obtained that did reveal a likely T12 compression fracture and probable remote T10 Compression fracture.  She has continued to have significant discomfort & has only been having minimal relief with pain medications. She is most comfortable while laying still but has difficult time laying flat on her back. She does have severe COPD the reports no acute flareup of this at this time. Followed closely by her pulmonologist. She does have a history of lung cancer. No other prior spine issues.  Pt denies any change in bowel or bladder habits, muscle weakness, numbness or falls associated with this pain.  ROS: Otherwise per HPI.   Clinical History: No specialty comments available.  She reports that she quit smoking about 17 years ago. Her smoking use included Cigarettes. She has a 20.00 pack-year smoking history. She has never used smokeless tobacco.   Recent Labs  06/11/15 0820  HGBA1C 5.8*    Assessment & Plan: Visit Diagnoses:    ICD-9-CM ICD-10-CM   1. Thoracic compression fracture, closed, initial encounter ([place]) 805.2 S22.000A MR Thoracic Spine w/o contrast  2. Chronic obstructive pulmonary disease, unspecified COPD type ([place]) 496 J44.9 MR Thoracic Spine w/o contrast  3. Lung cancer, middle lobe (HCC) 162.4 C34.2 MR Thoracic Spine w/o contrast    Plan: The patient is likely candidate for kyphoplasty however MRI of the thoracic spine indicated this time given prior cancer  history in atraumatic nature of this fracture. Additionally it appears that her T10 vertebrae is also likely had a compression fracture remotely although she denies any significant episodes of pain in her lower back prior to this single episode. She does have a history of steroid use in the COPD & likely has some osteoporosis that should be addressed after further evaluation with the MRI. I would like to obtain an MRI prior to initiating nasal calcitonin as well but this can also be considered as adjunct therapy if MRI confirms fragility fracture.  Follow-up: Return for MRI review.  Meds:  Meds ordered this encounter  Medications  . HYDROcodone-acetaminophen (NORCO/VICODIN) 5-325 MG tablet    Sig: Take 1 tablet by mouth every 8 (eight) hours.    Dispense:  30 tablet    Refill:  0   Procedures: No notes on file   Objective:  VS:  HT:'4\' 10"'$  (147.3 cm)   WT:114 lb (51.7 kg)  BMI:23.9    BP:(!) 112/53  HR:77bpm  TEMP: ( )  RESP:  Physical Exam:  Adult female. Alert and appropriate.  In no acute distress.  Lower extremities are overall well aligned with no significant deformity. No significant swelling.  Distal pulses 2+/4. No significant bruising/ecchymosis or erythema the skin Back: She has moderate discomfort with palpation of the thoracolumbar junction with pain with percussion using a reflex hammer. Negative straight leg raise. Sensation in her lower shin when he is intact. Lower extremity strength is normal within the posterior tibialis, extensor hallucis longus & knee flexors & extensors. Imaging:  Dg Chest 2 View  Result Date: 05/10/2016 CLINICAL DATA:  Mild dry cough and shortness of breath for 6 months. COPD. Lung cancer. EXAM: CHEST  2 VIEW COMPARISON:  Two-view chest x-ray 04/21/2016. CT of chest 12/23/2015. FINDINGS: The heart size is normal. Atherosclerotic calcifications are present at the aortic arch. Right lower lobe linear atelectasis or scarring is similar to the prior study.  Left basilar airspace disease is slightly increased. There is a progressive compression fracture at T12. A remote fracture at T10 is stable. Exaggerated kyphosis is noted. IMPRESSION: 1. Subtle left lower lobe airspace disease is new. This likely reflects atelectasis or early infection. 2. Stable scarring in or atelectasis at the right lung base. 3. Emphysema. 4. Aortic atherosclerosis. 5. Progressive compression fracture at T12. Electronically Signed   By: San Morelle M.D.   On: 05/10/2016 10:15   Dg Lumbar Spine Complete  Result Date: 04/24/2016 CLINICAL DATA:  Chronic back pain.  No known injury. EXAM: LUMBAR SPINE - COMPLETE 4+ VIEW COMPARISON:  Chest CT 12/23/2015. FINDINGS: Diffuse osteopenia. Mild compression fracture at T10, stable when compared to prior chest CT. Slight wedged appearance of the T12 vertebral body, likely early compression fracture, new since prior chest CT. Disc spaces are maintained. No subluxation. IMPRESSION: Mild chronic compression fracture at T10. Slight wedged appearance of the T12 vertebral body, likely slight compression fracture. Electronically Signed   By: Rolm Baptise M.D.   On: 04/24/2016 10:32    Past Medical/Family/Surgical/Social History: Medications & Allergies reviewed per EMR Patient Active Problem List   Diagnosis Date Noted  . Bilateral low back pain with sciatica 05/10/2016  . Bronchopneumonia 05/10/2016  . Acute respiratory failure ([place]) 03/31/2016  . CAP (community acquired pneumonia) 03/31/2016  . Chest pain 03/31/2016  . Tachycardia 03/31/2016  . PVC (premature ventricular contraction) 02/24/2016  . Palpitations 01/19/2016  . Respiratory failure ([place]) 06/11/2015  . Acute on chronic respiratory failure ([place]) 06/11/2015  . Knee pain, right 07/02/2014  . Chronic respiratory failure with hypoxia ([place]) 07/02/2014  . COPD, very severe ([place]) 07/02/2014  . COPD exacerbation ([place]) 06/13/2013  . Right rib fracture 05/15/2012  . Fatigue  07/22/2011  . Oral thrush 07/22/2011  . Lung cancer, middle lobe ([place]) 04/26/2011  . COPD (chronic obstructive pulmonary disease) ([place]) 01/13/2011   Past Medical History:  Diagnosis Date  . Allergy    seasonal pollen  . Asthma   . Cataract    bilat.cataract/lens implant  . Colon adenomas   . COPD (chronic obstructive pulmonary disease) ([place])   . Goiter   . History of radiation therapy 05/24/11-06/02/11   R middle lobe lung/ 60 gray  . Hx of colonic polyps   . Hyperlipidemia   . Lung cancer, middle lobe ([place]) 04/26/2011  . Osteoporosis   . Palpitations 01/19/2016  . PVC (premature ventricular contraction) 02/24/2016  . Shortness of breath    Family History  Problem Relation Age of Onset  . Emphysema Maternal Uncle   . Heart disease Mother   . Heart disease Maternal Grandfather   . Rheum arthritis Sister   . Cancer Sister   . Cancer Sister     throat cancer  . Brain cancer Sister   . Cancer Sister   . Ovarian cancer Sister   . Breast cancer Daughter    Past Surgical History:  Procedure Laterality Date  . BIOPSY THYROID  2016  . COLONOSCOPY W/ BIOPSIES    . EYE SURGERY    . TONSILLECTOMY    .  TOTAL ABDOMINAL HYSTERECTOMY     Social History   Occupational History  . retired    Social History Main Topics  . Smoking status: Former Smoker    Packs/day: 0.50    Years: 40.00    Types: Cigarettes    Quit date: 06/07/1998  . Smokeless tobacco: Never Used  . Alcohol use No  . Drug use: No  . Sexual activity: Not on file

## 2016-05-19 ENCOUNTER — Other Ambulatory Visit: Payer: Self-pay | Admitting: Pharmacist

## 2016-05-19 NOTE — Patient Outreach (Signed)
[facility]) Care Management  05/19/2016  Heather Barnes February 22, 1934 [id number]  82 year old female referred to [place] for medication assistance with Breo Ellipta and Incruse Ellipta. Patient has previously been denied low income subsidy/extra help and today states she has been denied by [place] for patient assistance with her Breo and Incruse Ellipta.  Patient states that she does have enough medications to last until the new year and Dr Chase Caller is providing samples.  Plan: [place] will sign off as patient is unfortunately not eligible for patient assistance and has enough medications to last until she is out of the coverage gap.  Please reconsult if needed.  Bennye Alm, PharmD [facility] PGY2 Pharmacy Resident [phone]

## 2016-05-21 ENCOUNTER — Ambulatory Visit
Admission: RE | Admit: 2016-05-21 | Discharge: 2016-05-21 | Disposition: A | Discharge status: Home / Self Care | Payer: Medicare Other | Source: Ambulatory Visit | Attending: Sports Medicine | Admitting: Sports Medicine

## 2016-05-21 DIAGNOSIS — S22080A Wedge compression fracture of T11-T12 vertebra, initial encounter for closed fracture: Secondary | ICD-10-CM | POA: Diagnosis not present

## 2016-05-21 DIAGNOSIS — C342 Malignant neoplasm of middle lobe, bronchus or lung: Secondary | ICD-10-CM

## 2016-05-21 DIAGNOSIS — S22000A Wedge compression fracture of unspecified thoracic vertebra, initial encounter for closed fracture: Secondary | ICD-10-CM

## 2016-05-21 DIAGNOSIS — J449 Chronic obstructive pulmonary disease, unspecified: Secondary | ICD-10-CM

## 2016-05-25 DIAGNOSIS — C342 Malignant neoplasm of middle lobe, bronchus or lung: Secondary | ICD-10-CM | POA: Diagnosis not present

## 2016-05-25 DIAGNOSIS — M810 Age-related osteoporosis without current pathological fracture: Secondary | ICD-10-CM | POA: Diagnosis not present

## 2016-05-25 DIAGNOSIS — J189 Pneumonia, unspecified organism: Secondary | ICD-10-CM | POA: Diagnosis not present

## 2016-05-25 DIAGNOSIS — J45909 Unspecified asthma, uncomplicated: Secondary | ICD-10-CM | POA: Diagnosis not present

## 2016-05-25 DIAGNOSIS — J441 Chronic obstructive pulmonary disease with (acute) exacerbation: Secondary | ICD-10-CM | POA: Diagnosis not present

## 2016-05-25 DIAGNOSIS — J440 Chronic obstructive pulmonary disease with acute lower respiratory infection: Secondary | ICD-10-CM | POA: Diagnosis not present

## 2016-06-02 ENCOUNTER — Telehealth: Payer: Self-pay | Admitting: Internal Medicine

## 2016-06-02 DIAGNOSIS — J180 Bronchopneumonia, unspecified organism: Secondary | ICD-10-CM

## 2016-06-02 NOTE — Telephone Encounter (Signed)
Heather Barnes  Please let [name] knwot hat cxr 12/4/`7 might have a LLL infiltrate or not. They rae not sure. Apologize for delay calling. REcommend she do a cxr 2 view mid jan 2018; we will call with results  Thanks  Dr. Brand Males, M.D., [facility].C.P Pulmonary and Critical Care Medicine Staff Physician [place] [facility] Pager: [phone], If no answer or between  15:00h - 7:00h: call [phone]  06/02/2016 2:49 AM

## 2016-06-03 DIAGNOSIS — J45909 Unspecified asthma, uncomplicated: Secondary | ICD-10-CM | POA: Diagnosis not present

## 2016-06-03 DIAGNOSIS — C342 Malignant neoplasm of middle lobe, bronchus or lung: Secondary | ICD-10-CM | POA: Diagnosis not present

## 2016-06-03 DIAGNOSIS — J441 Chronic obstructive pulmonary disease with (acute) exacerbation: Secondary | ICD-10-CM | POA: Diagnosis not present

## 2016-06-03 DIAGNOSIS — M810 Age-related osteoporosis without current pathological fracture: Secondary | ICD-10-CM | POA: Diagnosis not present

## 2016-06-03 DIAGNOSIS — J440 Chronic obstructive pulmonary disease with acute lower respiratory infection: Secondary | ICD-10-CM | POA: Diagnosis not present

## 2016-06-03 DIAGNOSIS — J189 Pneumonia, unspecified organism: Secondary | ICD-10-CM | POA: Diagnosis not present

## 2016-06-03 NOTE — Telephone Encounter (Signed)
Advised pt of test results and verbalized understanding. Order placed for X ray.

## 2016-08-10 ENCOUNTER — Ambulatory Visit (INDEPENDENT_AMBULATORY_CARE_PROVIDER_SITE_OTHER)
Admission: RE | Admit: 2016-08-10 | Discharge: 2016-08-10 | Disposition: A | Discharge status: Home / Self Care | Payer: Medicare Other | Source: Ambulatory Visit | Attending: Internal Medicine | Admitting: Internal Medicine

## 2016-08-10 ENCOUNTER — Encounter: Payer: Self-pay | Admitting: Internal Medicine

## 2016-08-10 ENCOUNTER — Ambulatory Visit (INDEPENDENT_AMBULATORY_CARE_PROVIDER_SITE_OTHER): Payer: Medicare Other | Admitting: Internal Medicine

## 2016-08-10 DIAGNOSIS — J441 Chronic obstructive pulmonary disease with (acute) exacerbation: Secondary | ICD-10-CM

## 2016-08-10 DIAGNOSIS — J189 Pneumonia, unspecified organism: Secondary | ICD-10-CM | POA: Diagnosis not present

## 2016-08-10 MED ORDER — PREDNISONE 10 MG PO TABS: ORAL_TABLET | ORAL | 0 refills | Status: DC

## 2016-08-10 MED ORDER — DOXYCYCLINE HYCLATE 100 MG PO TABS: 100.0000 mg | ORAL_TABLET | Freq: Two times a day (BID) | ORAL | 0 refills | Status: DC

## 2016-08-10 NOTE — Progress Notes (Signed)
Subjective:     Patient ID: Heather Barnes, female   DOB: 15-Aug-1933, 83 y.o.   MRN: [id number]  HPI  OV 05/10/2016  Chief Complaint  Patient presents with  . Follow-up    Pt here for 41monthf/u. Pt states she had pna in October but states she feel back to baseline. Pt c/o mild dry cough, PND. Pt denies CP/tightness.    Follow-up Gold stage IV COPD with chronic hypoxic respiratory failure  83year old female. This is a follow-up from summer 2017. In October 2017 she was admitted for left upper lobe pneumonia. Chest x-ray was reviewed. She is back to stay on triple inhaler therapy. She continues on oxygen. Overall she's stable. There are no new issues with COPD standpoint. In the interim she's developed low back pain without any bladder or bowel disturbance. She's had 2 emergency department visits 04/24/2016 and 04/27/2016. The second one did a chest x-ray and she has T10 and T12 wedge compression fracture. This is severe and disabling her. She has an orthopedic appointment in a few days. She is taking appropriate pain medications.  OV 08/10/2016  Chief Complaint  Patient presents with  . Follow-up    f/u 3 months COPD, more SOB last couple of days, wheezing      Follow-up Gold stage IV COPD with chronic hypoxic respiratory failure  She is now 83 This is a routine follow-up. She is to follow-up back pain but it is better. Last visit December 2017. A follow-up chest x-ray for pneumonia and that showed improvement in infiltrates but she's not had a further follow-up of the chest x-ray. Now she is telling me for the last few days she's had increased sinus drainage, wheezing, chest tightness, cough that is worse than baseline. This no fever or edema or hemoptysis    has a past medical history of Allergy; Asthma; Cataract; Colon adenomas; COPD (chronic obstructive pulmonary disease) ([place]; Goiter; History of radiation therapy (05/24/11-06/02/11); colonic polyps; Hyperlipidemia; Lung [place],  [place]); Osteoporosis; Palpitations (01/19/2016); PVC (premature ventricular contraction) (02/24/2016); and Shortness of breath.   reports that she quit smoking about 18 years ago. Her smoking use included Cigarettes. She has a 20.00 pack-year smoking history. She has never used smokeless tobacco.  Past Surgical History:  Procedure Laterality Date  . BIOPSY THYROID  2016  . COLONOSCOPY W/ BIOPSIES    . EYE SURGERY    . TONSILLECTOMY    . TOTAL ABDOMINAL HYSTERECTOMY      Allergies  Allergen Reactions  . Tiotropium Bromide Monohydrate Hives, Shortness Of Breath and Rash    Spiriva   . Codeine Other (See Comments)    Hyperactivity,crying  . Fosamax [Alendronate] Other (See Comments)    Muscle aches  . Asa [Aspirin] Other (See Comments)    Bleeding -ulcers  . Latex Rash    Immunization History  Administered Date(s) Administered  . Influenza Split 04/07/2013, 03/07/2014  . Influenza Whole 02/06/2011, 03/07/2012  . Influenza, High Dose Seasonal PF 03/07/2016  . Influenza,inj,Quad PF,36+ Mos 01/27/2015  . Influenza-Unspecified 01/05/2014  . Pneumococcal Conjugate-13 10/05/2013  . Pneumococcal Polysaccharide-23 06/08/2003  . Pneumococcal-Unspecified 11/05/2012    Family History  Problem Relation Age of Onset  . Emphysema Maternal Uncle   . Heart disease Mother   . Heart disease Maternal Grandfather   . Rheum arthritis Sister   . Cancer Sister   . Cancer Sister     throat cancer  . Brain cancer Sister   . Cancer Sister   .  Ovarian cancer Sister   . Breast cancer Daughter      Current Outpatient Prescriptions:  .  acetaminophen (TYLENOL) 500 MG tablet, Take 1,000 mg by mouth every 6 (six) hours as needed for moderate pain., Disp: , Rfl:  .  albuterol (PROVENTIL HFA;VENTOLIN HFA) 108 (90 Base) MCG/ACT inhaler, Inhale 2 puffs into the lungs every 6 (six) hours as needed for wheezing or shortness of breath., Disp: , Rfl:  .  cetirizine (ZYRTEC) 10 MG  tablet, Take 10 mg by mouth daily.  , Disp: , Rfl:  .  Cholecalciferol (VITAMIN D) 2000 UNITS CAPS, Take 2,000 Units by mouth 2 (two) times daily. , Disp: , Rfl:  .  fluticasone (FLONASE) 50 MCG/ACT nasal spray, Place 2 sprays into the nose daily as needed for allergies or rhinitis. , Disp: , Rfl:  .  fluticasone furoate-vilanterol (BREO ELLIPTA) 100-25 MCG/INH AEPB, Inhale 1 puff into the lungs daily., Disp: 60 each, Rfl: 11 .  Omega-3 Fatty Acids (FISH OIL) 1000 MG CAPS, Take 1,000 mg by mouth at bedtime. , Disp: , Rfl:  .  SINGULAIR 10 MG tablet, Take 10 mg by mouth at bedtime. , Disp: , Rfl:  .  umeclidinium bromide (INCRUSE ELLIPTA) 62.5 MCG/INH AEPB, Inhale 1 puff into the lungs daily., Disp: 30 each, Rfl: 11   Review of Systems     Objective:   Physical Exam  Constitutional: She is oriented to person, place, and time. She appears well-developed and well-nourished. No distress.  Frail but looks better  HENT:  Head: Normocephalic and atraumatic.  Right Ear: External ear normal.  Left Ear: External ear normal.  Mouth/Throat: Oropharynx is clear and moist. No oropharyngeal exudate.  o2 on  Eyes: Conjunctivae and EOM are normal. Pupils are equal, round, and reactive to light. Right eye exhibits no discharge. Left eye exhibits no discharge. No scleral icterus.  Neck: Normal range of motion. Neck supple. No JVD present. No tracheal deviation present. No thyromegaly present.  Cardiovascular: Normal rate, regular rhythm, normal heart sounds and intact distal pulses.  Exam reveals no gallop and no friction rub.   No murmur heard. Pulmonary/Chest: Effort normal and breath sounds normal. No respiratory distress. She has no wheezes. She has no rales. She exhibits no tenderness.  barrell chest with good air entry wihtout wheeze  Abdominal: Soft. Bowel sounds are normal. She exhibits no distension and no mass. There is no tenderness. There is no rebound and no guarding.  Musculoskeletal: Normal  range of motion. She exhibits no edema or tenderness.  OA +  Lymphadenopathy:    She has no cervical adenopathy.  Neurological: She is alert and oriented to person, place, and time. She has normal reflexes. No cranial nerve deficit. She exhibits normal muscle tone. Coordination normal.  Skin: Skin is warm and dry. No rash noted. She is not diaphoretic. No erythema. No pallor.  Psychiatric: She has a normal mood and affect. Her behavior is normal. Judgment and thought content normal.  Vitals reviewed.   Vitals:   08/10/16 0926  BP: 118/60  Pulse: 93  Temp: 98.3 F (36.8 C)  TempSrc: Oral  SpO2: 97%  Weight: 117 lb 6.4 oz (53.3 kg)  Height: '4\' 10"'$  (1.473 m)    Estimated body mass index is 24.54 kg/m as calculated from the following:   Height as of this encounter: '4\' 10"'$  (1.473 m).   Weight as of this encounter: 117 lb 6.4 oz (53.3 kg).      Assessment:  ICD-9-CM ICD-10-CM   1. COPD exacerbation ([place]) 491.21 J44.1        Plan:     COPD exacerbation (HCC) Mild copd flare up 08/10/2016  Plan  - cxr 2 view as followup from previous pneumonia ; wil call with results - Take doxycycline '100mg'$  po twice daily x 5 days; take after meals and avoid sunlight - Please take prednisone 40 mg x1 day, then 30 mg x1 day, then 20 mg x1 day, then 10 mg x1 day, and then 5 mg x1 day and stop - continue regular copd medications including oxygen  Followup 3 months or sooner      Dr. Brand Males, M.D., [facility].C.P Pulmonary and Critical Care Medicine Staff Physician [place] [facility] Pager: [phone], If no answer or between  15:00h - 7:00h: call [phone]  08/10/2016 9:57 AM

## 2016-08-10 NOTE — Patient Instructions (Signed)
COPD exacerbation (HCC) Mild copd flare up 08/10/2016  Plan  - cxr 2 view as followup from previous pneumonia ; wil call with results - Take doxycycline '100mg'$  po twice daily x 5 days; take after meals and avoid sunlight - Please take prednisone 40 mg x1 day, then 30 mg x1 day, then 20 mg x1 day, then 10 mg x1 day, and then 5 mg x1 day and stop - continue regular copd medications including oxygen  Followup 3 months or sooner

## 2016-08-10 NOTE — Assessment & Plan Note (Addendum)
Mild copd flare up 08/10/2016  Plan  - cxr 2 view as followup from previous pneumonia ; wil call with results - Take doxycycline '100mg'$  po twice daily x 5 days; take after meals and avoid sunlight - Please take prednisone 40 mg x1 day, then 30 mg x1 day, then 20 mg x1 day, then 10 mg x1 day, and then 5 mg x1 day and stop - continue regular copd medications including oxygen  Followup 3 months or sooner

## 2016-08-18 ENCOUNTER — Other Ambulatory Visit: Payer: Self-pay | Admitting: Internal Medicine

## 2016-09-01 ENCOUNTER — Encounter: Payer: Self-pay | Admitting: Cardiovascular Disease

## 2016-09-01 ENCOUNTER — Ambulatory Visit (INDEPENDENT_AMBULATORY_CARE_PROVIDER_SITE_OTHER): Payer: Medicare Other | Admitting: Cardiovascular Disease

## 2016-09-01 VITALS — BP 134/64 | HR 78 | Ht <= 58 in | Wt 119.0 lb

## 2016-09-01 DIAGNOSIS — Z13220 Encounter for screening for lipoid disorders: Secondary | ICD-10-CM | POA: Diagnosis not present

## 2016-09-01 DIAGNOSIS — I2510 Atherosclerotic heart disease of native coronary artery without angina pectoris: Secondary | ICD-10-CM

## 2016-09-01 DIAGNOSIS — E7800 Pure hypercholesterolemia, unspecified: Secondary | ICD-10-CM

## 2016-09-01 DIAGNOSIS — I2584 Coronary atherosclerosis due to calcified coronary lesion: Secondary | ICD-10-CM | POA: Diagnosis not present

## 2016-09-01 DIAGNOSIS — I493 Ventricular premature depolarization: Secondary | ICD-10-CM

## 2016-09-01 HISTORY — DX: Atherosclerotic heart disease of native coronary artery without angina pectoris: I25.10

## 2016-09-01 NOTE — Progress Notes (Signed)
Cardiology Office Note   Date:  09/01/2016   ID:  Heather Barnes [birth year], MRN [id number]  PCP:  [name], MD  Cardiologist:   Skeet Latch, MD   Chief Complaint  Patient presents with  . Follow-up    6 months;      History of Present Illness: Heather Barnes is a 83 y.o. female with asymptomatic coronary calcifications, PVCs, hyperlipidemia, severe COPD and lung cancer in remission who presents for follow up.  She was initially evaluated 01/2015 due to coronary calcifications in the LM, LAD and RCA identified on cardiac CT.   Heather Barnes reported exertional dyspnea that had been getting progressively worse for years.  This was attributed to both her COPD and lung cancer and she declined stress testing.  Heather Barnes can't tolerate aspirin due to bleeding ulcers.  She was started on atorvastatin but developed myalgias.  She was hospitalized with pneumonia 06/2015 and again 03/2016.  Since then she has been struggling to recover and remains short of breath.  She denies chest pain or pressure, lower extremity edema, orhtopnea or PND. Heather Barnes previously reported frequent palpitations.  She was referred for a 7 day event monitor 01/2016 that showed occasional PVCs with an episode of ventricular quadrigeminy.  She notes that while wearing the monitor her oxygen levels were dropping with minimal exertion.  She denies any palpitations, lightheadedness or dizziness lately.  She recently diagnosed with a compression fracture and continues to have back pain.   Past Medical History:  Diagnosis Date  . Allergy    seasonal pollen  . Asthma   . Cataract    bilat.cataract/lens implant  . Colon adenomas   . COPD (chronic obstructive pulmonary disease) ([place])   . Coronary artery calcification 09/01/2016  . Goiter   . History of radiation therapy 05/24/11-06/02/11   R middle lobe lung/ 60 gray  . Hx of colonic polyps   . Hyperlipidemia   . Lung cancer, middle lobe ([place])  04/26/2011  . Osteoporosis   . Palpitations 01/19/2016  . PVC (premature ventricular contraction) 02/24/2016  . Shortness of breath     Past Surgical History:  Procedure Laterality Date  . BIOPSY THYROID  2016  . COLONOSCOPY W/ BIOPSIES    . EYE SURGERY    . TONSILLECTOMY    . TOTAL ABDOMINAL HYSTERECTOMY       Current Outpatient Prescriptions  Medication Sig Dispense Refill  . acetaminophen (TYLENOL) 500 MG tablet Take 1,000 mg by mouth every 6 (six) hours as needed for moderate pain.    [name] albuterol (PROVENTIL HFA;VENTOLIN HFA) 108 (90 Base) MCG/ACT inhaler Inhale 2 puffs into the lungs every 6 (six) hours as needed for wheezing or shortness of breath.    . cetirizine (ZYRTEC) 10 MG tablet Take 10 mg by mouth daily.      . Cholecalciferol (VITAMIN D) 2000 UNITS CAPS Take 2,000 Units by mouth 2 (two) times daily.     . fluticasone (FLONASE) 50 MCG/ACT nasal spray Place 2 sprays into the nose daily as needed for allergies or rhinitis.     . fluticasone furoate-vilanterol (BREO ELLIPTA) 100-25 MCG/INH AEPB Inhale 1 puff into the lungs daily. 60 each 11  . INCRUSE ELLIPTA 62.5 MCG/INH AEPB INHALE ONE PUFF INTO LUNGS ONCE DAILY 30 each 5  . Omega-3 Fatty Acids (FISH OIL) 1000 MG CAPS Take 1,000 mg by mouth at bedtime.     [name] SINGULAIR 10 MG tablet Take 10 mg by mouth  at bedtime.     [name] umeclidinium bromide (INCRUSE ELLIPTA) 62.5 MCG/INH AEPB Inhale 1 puff into the lungs daily. 30 each 11   No current facility-administered medications for this visit.     Allergies:   Tiotropium bromide monohydrate; Codeine; Fosamax [alendronate]; Asa [aspirin]; and Latex    Social History:  The patient  reports that she quit smoking about 18 years ago. Her smoking use included Cigarettes. She has a 20.00 pack-year smoking history. She has never used smokeless tobacco. She reports that she does not drink alcohol or use drugs.   Family History:  The patient's family history includes Brain cancer in her  sister; Breast cancer in her daughter; Cancer in her sister, sister, and sister; Emphysema in her maternal uncle; Heart disease in her maternal grandfather and mother; Ovarian cancer in her sister; Rheum arthritis in her sister.    ROS:  Please see the history of present illness.   Otherwise, review of systems are positive for none.   All other systems are reviewed and negative.    PHYSICAL EXAM: VS:  BP 134/64   Pulse 78   Ht '4\' 10"'$  (1.473 m)   Wt 54 kg (119 lb)   BMI 24.87 kg/m  , BMI Body mass index is 24.87 kg/m. GENERAL:  Well appearing HEENT:  Pupils equal round and reactive, fundi not visualized, oral mucosa unremarkable NECK:  No jugular venous distention, waveform within normal limits, carotid upstroke brisk and symmetric, no bruits LYMPHATICS:  No cervical adenopathy LUNGS:  Mild, diffuse, expiratory  HEART:  RRR.  PMI not displaced or sustained,S1 and S2 within normal limits, no S3, no S4, no clicks, no rubs, no murmurs ABD:  Flat, positive bowel sounds normal in frequency in pitch, no bruits, no rebound, no guarding, no midline pulsatile mass, no hepatomegaly, no splenomegaly EXT:  2 plus pulses throughout, no edema, no cyanosis no clubbing SKIN:  No rashes no nodules NEURO:  Cranial nerves II through XII grossly intact, motor grossly intact throughout PSYCH:  Cognitively intact, oriented to person place and time   EKG:  EKG is ordered today. The ekg ordered 01/19/16 demonstrates sinus rhythm at 82 bpm.   09/01/16: Sinus rhythm.  Rate 78 bpm.    7 Day Event Monitor 01/21/16:  Quality: Fair.  Baseline artifact. Predominant rhythm: sinus  Average heart rate: 77 bpm Max heart rate: 110 bpm Min heart rate: 64 bpm  Occasional PVCs, with an episode of ventricular quadrigeminy.    Chest CT 12/20/14: Atherosclerosis of the thoracic aorta, mediastinal great vessels, LM, LAD and RCA.  Recent Labs: 01/21/2016: Magnesium 2.0; TSH 1.13 04/03/2016: ALT 27; BUN 15;  Creatinine, Ser 0.46; Potassium 3.9; Sodium 140 04/04/2016: Hemoglobin 12.3; Platelets 265    Lipid Panel    Component Value Date/Time   CHOL 186 01/21/2016 1126   TRIG 97 01/21/2016 1126   HDL 86 01/21/2016 1126   CHOLHDL 2.2 01/21/2016 1126   VLDL 19 01/21/2016 1126   LDLCALC 81 01/21/2016 1126      Wt Readings from Last 3 Encounters:  09/01/16 54 kg (119 lb)  08/10/16 53.3 kg (117 lb 6.4 oz)  05/12/16 51.7 kg (114 lb)      ASSESSMENT AND PLAN:  # Asymptomatic coronary calcifications:  # Hyperlipidemia:  Ms. Maxie Better remains asymptomatic. She is unable to tolerate beta blockers or aspirin.  Atorvastatin was stopped due to myalgias.  She is currently taking fish oil.  She is due to have her lipids repeated. She  will see her PCP next month and will have them performed at that time.  We previously discussed PCSK9 inhibitors and she was not interested.    # PVCs: PVCs were noted on her event monitor.  This has improved after she increased her oxygen.  She has no exertional chest pain at this time and isn't able to tolerate antiplatelets, so we will no pursue an ischemia evaluation.    # Hypertension: Blood pressure improved to 134/64 on repeat. Will not make any changes at this time.  Current medicines are reviewed at length with the patient today.  The patient does not have concerns regarding medicines.  The following changes have been made: none Labs/ tests ordered today include: none  No orders of the defined types were placed in this encounter.    Disposition:   FU with Dr. Jonelle Sidle C. [place] in 6 months.   Signed, Skeet Latch, MD  09/01/2016 1:00 PM    [place]

## 2016-09-01 NOTE — Addendum Note (Signed)
Addended by: Alvina Filbert B on: 09/01/2016 01:49 PM   Modules accepted: Orders

## 2016-09-01 NOTE — Patient Instructions (Signed)
Medication Instructions:  Your physician recommends that you continue on your current medications as directed. Please refer to the Current Medication list given to you today.  Labwork: FASTING LIPID AT YOUR PRIMARY CARE NEXT MONTH   Testing/Procedures: NONE  Follow-Up: Your physician wants you to follow-up in: [place] will receive a reminder letter in the mail two months in advance. If you don't receive a letter, please call our office to schedule the follow-up appointment.  If you need a refill on your cardiac medications before your next appointment, please call your pharmacy.

## 2016-09-02 ENCOUNTER — Other Ambulatory Visit: Payer: Self-pay | Admitting: Internal Medicine

## 2016-09-21 IMAGING — DX DG CHEST 2V
2 series · 2 of 2 positions shown · non-contrast
Comparison: 06/13/2015

CLINICAL DATA: Pneumonia. Follow-up. History of COPD and lung
cancer.

EXAM:
CHEST  2 VIEW

[chest pa]
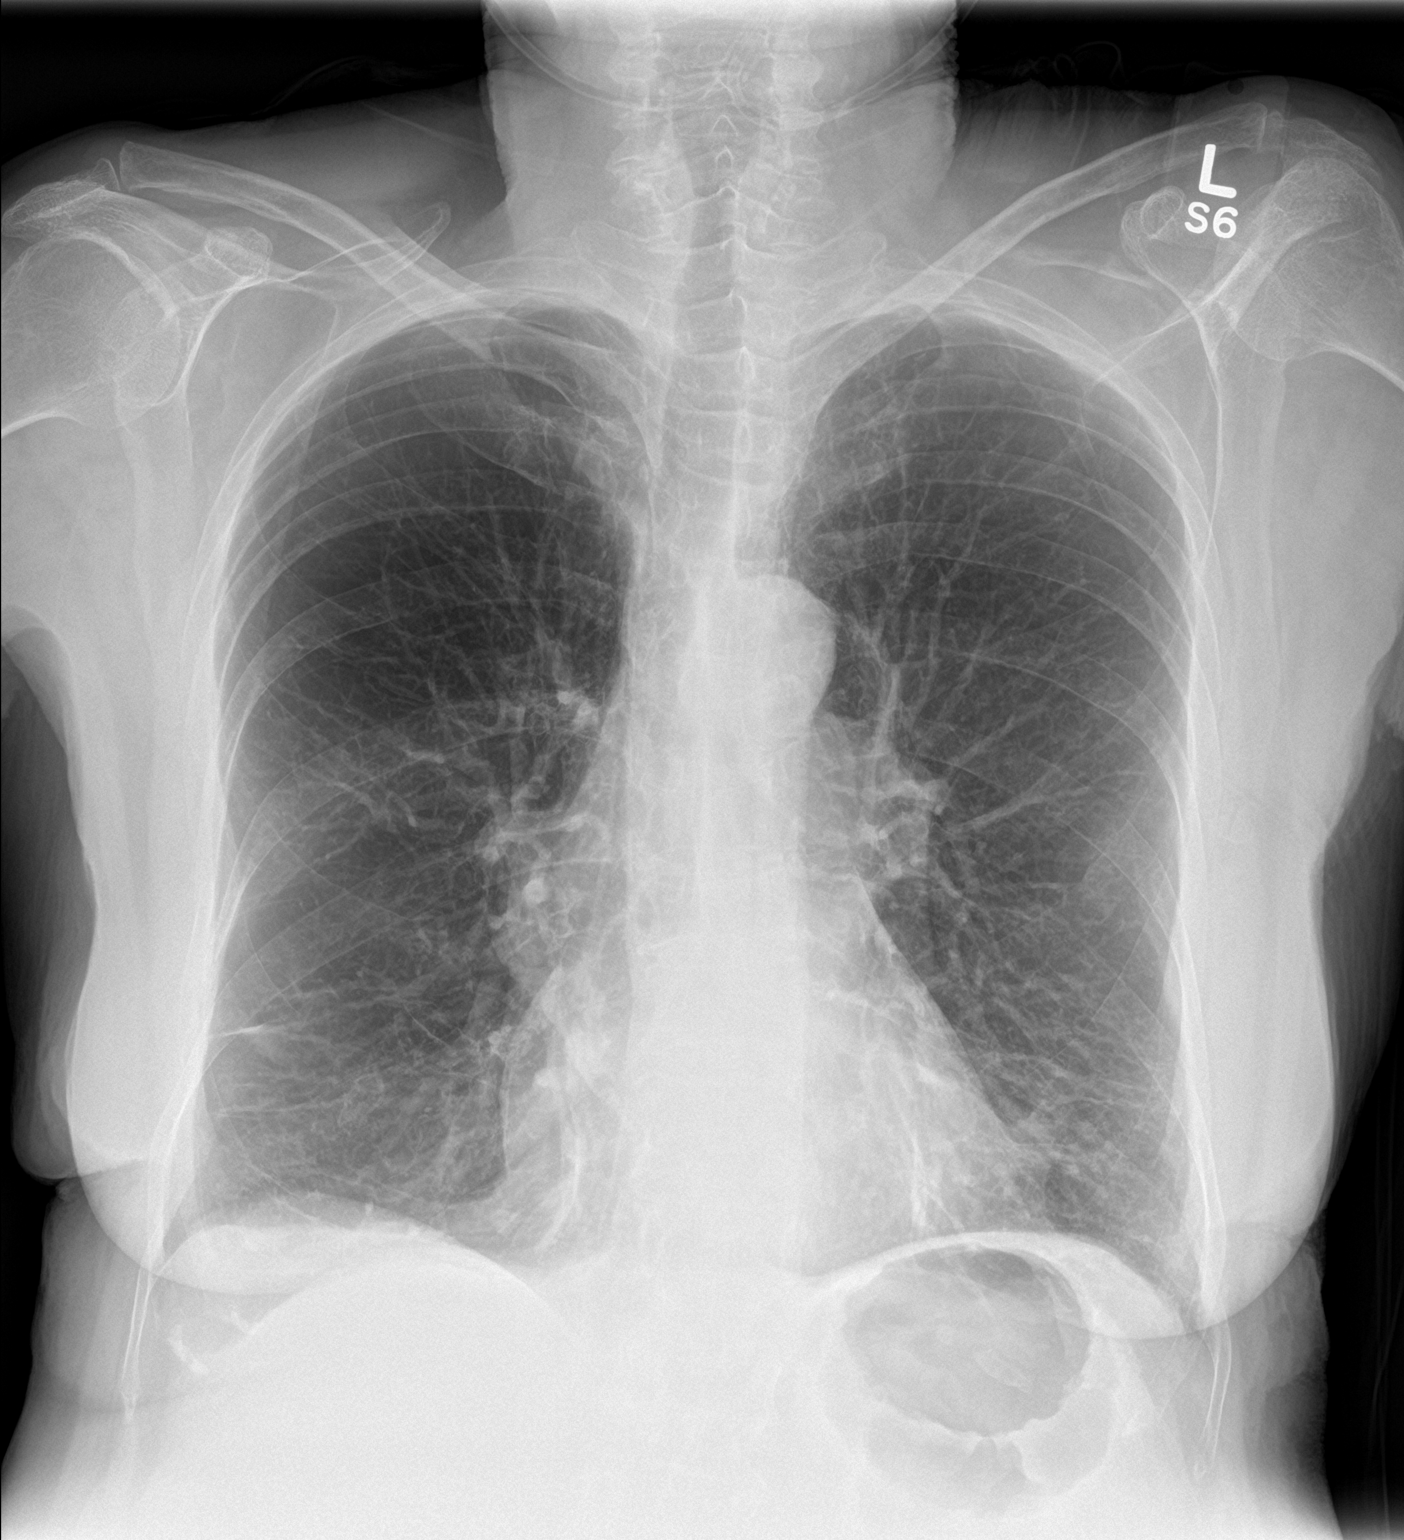

[chest lat]
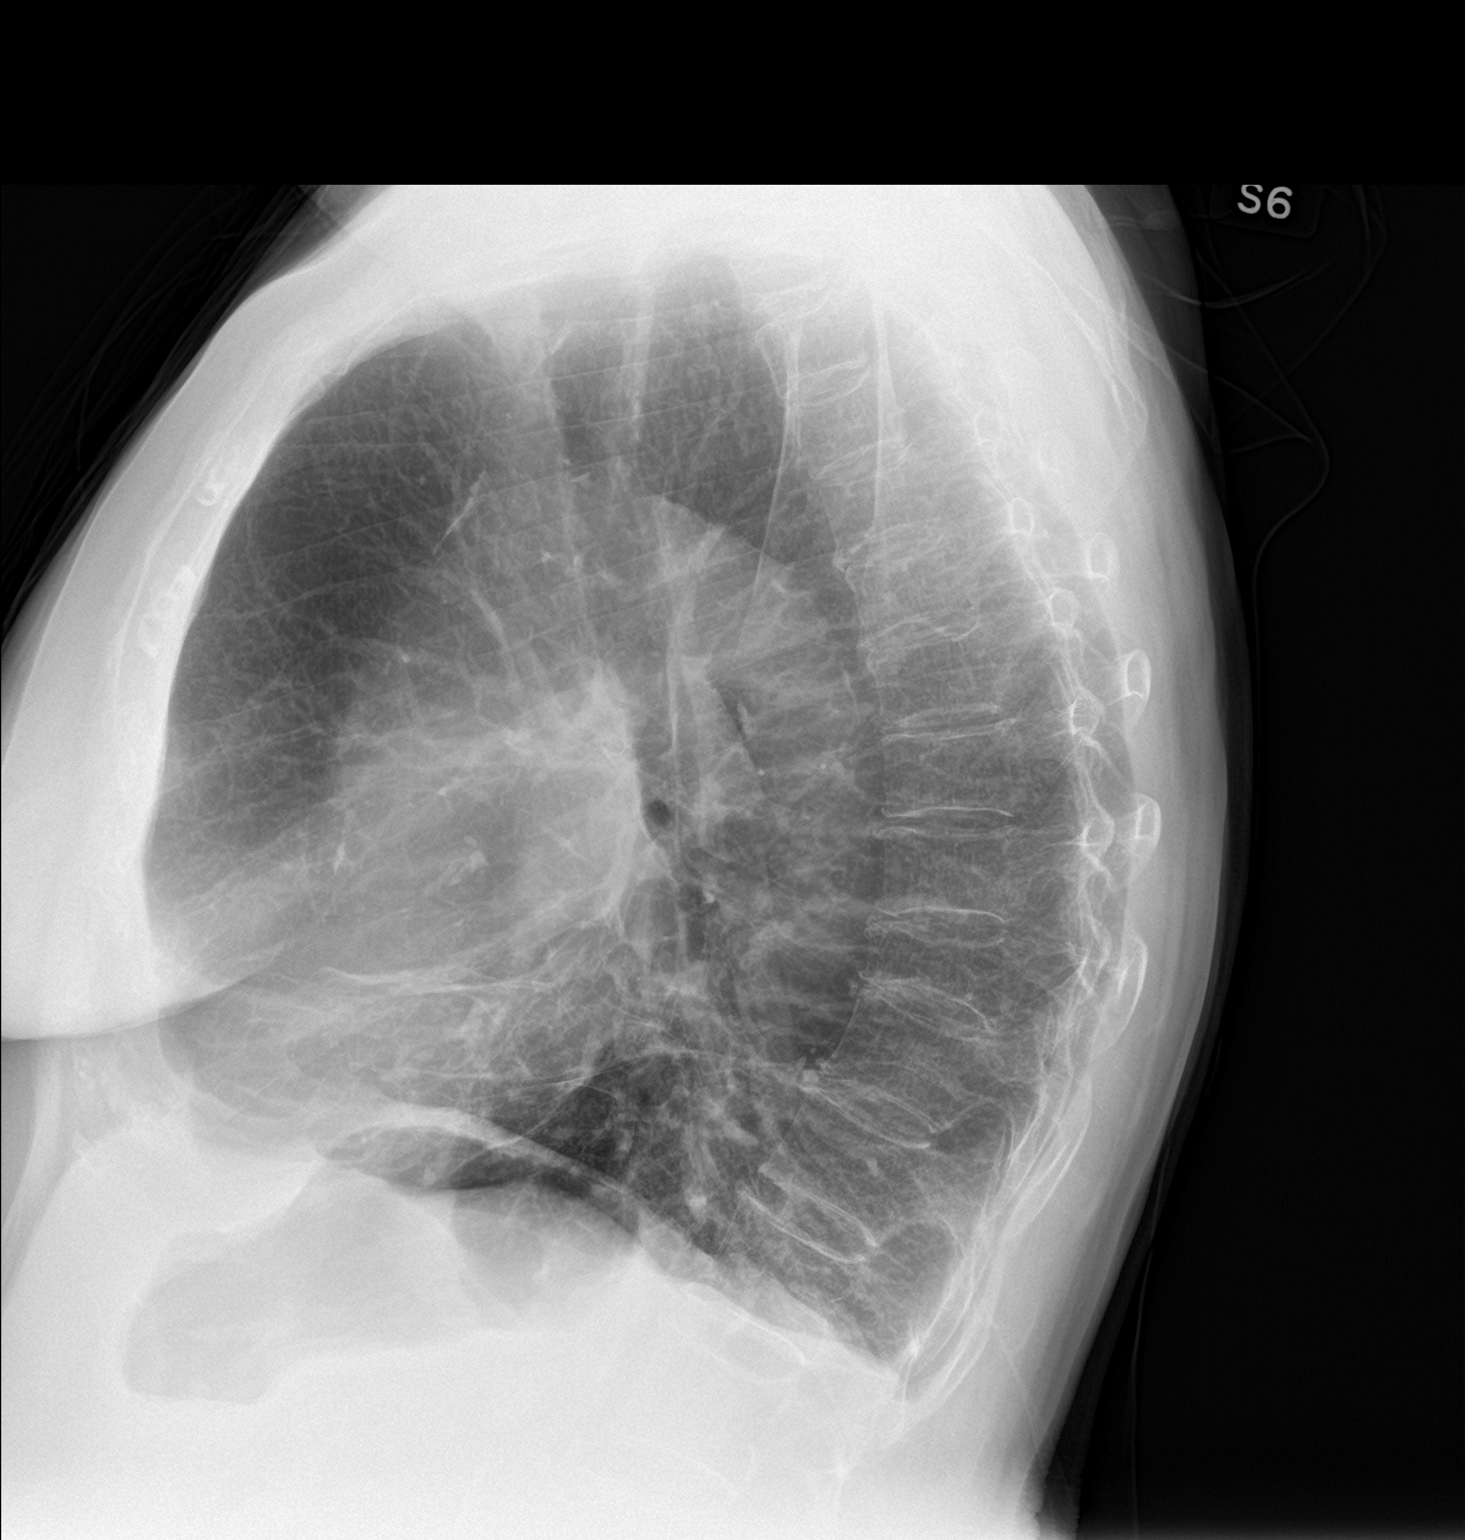

[2 of 2 positions shown; findings below may reference images not displayed]

FINDINGS: Cardiomediastinal silhouette is unchanged. The patient has taken a
greater inspiration than on the prior study, and the lungs are
hyperinflated consistent with underlying COPD. There is improved
aeration of both lower lobes. Mild, predominantly curvilinear
opacities remain in the left greater than right lung bases and
likely reflect chronic scarring. No new areas of airspace
consolidation are seen. There is no pleural effusion or
pneumothorax. A chronic, mild T9 compression fracture is unchanged.
IMPRESSION: Interval clearing of bibasilar opacities consistent with resolved
pneumonia. Chronic bibasilar scarring and COPD.

## 2016-10-11 DIAGNOSIS — M810 Age-related osteoporosis without current pathological fracture: Secondary | ICD-10-CM | POA: Diagnosis not present

## 2016-10-11 DIAGNOSIS — Z86010 Personal history of colonic polyps: Secondary | ICD-10-CM | POA: Diagnosis not present

## 2016-10-11 DIAGNOSIS — J309 Allergic rhinitis, unspecified: Secondary | ICD-10-CM | POA: Diagnosis not present

## 2016-10-11 DIAGNOSIS — Z1389 Encounter for screening for other disorder: Secondary | ICD-10-CM | POA: Diagnosis not present

## 2016-10-11 DIAGNOSIS — J189 Pneumonia, unspecified organism: Secondary | ICD-10-CM | POA: Diagnosis not present

## 2016-10-11 DIAGNOSIS — J449 Chronic obstructive pulmonary disease, unspecified: Secondary | ICD-10-CM | POA: Diagnosis not present

## 2016-10-11 DIAGNOSIS — M549 Dorsalgia, unspecified: Secondary | ICD-10-CM | POA: Diagnosis not present

## 2016-10-11 DIAGNOSIS — E785 Hyperlipidemia, unspecified: Secondary | ICD-10-CM | POA: Diagnosis not present

## 2016-10-11 DIAGNOSIS — C3490 Malignant neoplasm of unspecified part of unspecified bronchus or lung: Secondary | ICD-10-CM | POA: Diagnosis not present

## 2016-10-11 DIAGNOSIS — J9611 Chronic respiratory failure with hypoxia: Secondary | ICD-10-CM | POA: Diagnosis not present

## 2016-10-31 ENCOUNTER — Telehealth: Payer: Self-pay | Admitting: Cardiology

## 2016-10-31 ENCOUNTER — Emergency Department (HOSPITAL_COMMUNITY)
Admission: EM | Admit: 2016-10-31 | Discharge: 2016-10-31 | Disposition: A | Discharge status: Home / Self Care | Payer: Medicare Other | Attending: Emergency Medicine | Admitting: Emergency Medicine

## 2016-10-31 ENCOUNTER — Telehealth: Payer: Self-pay | Admitting: Internal Medicine

## 2016-10-31 ENCOUNTER — Emergency Department (HOSPITAL_COMMUNITY): Payer: Medicare Other

## 2016-10-31 ENCOUNTER — Ambulatory Visit (HOSPITAL_COMMUNITY)
Admission: EM | Admit: 2016-10-31 | Discharge: 2016-10-31 | Disposition: A | Discharge status: Nursing Home (Includes ICF) | Payer: Medicare Other

## 2016-10-31 ENCOUNTER — Encounter (HOSPITAL_COMMUNITY): Payer: Self-pay

## 2016-10-31 DIAGNOSIS — Z91040 Latex allergy status: Secondary | ICD-10-CM | POA: Insufficient documentation

## 2016-10-31 DIAGNOSIS — J449 Chronic obstructive pulmonary disease, unspecified: Secondary | ICD-10-CM | POA: Diagnosis not present

## 2016-10-31 DIAGNOSIS — Z85118 Personal history of other malignant neoplasm of bronchus and lung: Secondary | ICD-10-CM | POA: Insufficient documentation

## 2016-10-31 DIAGNOSIS — Z79899 Other long term (current) drug therapy: Secondary | ICD-10-CM | POA: Diagnosis not present

## 2016-10-31 DIAGNOSIS — Z87891 Personal history of nicotine dependence: Secondary | ICD-10-CM | POA: Insufficient documentation

## 2016-10-31 DIAGNOSIS — R002 Palpitations: Secondary | ICD-10-CM

## 2016-10-31 DIAGNOSIS — I493 Ventricular premature depolarization: Secondary | ICD-10-CM | POA: Diagnosis not present

## 2016-10-31 DIAGNOSIS — R0602 Shortness of breath: Secondary | ICD-10-CM | POA: Diagnosis not present

## 2016-10-31 LAB — BASIC METABOLIC PANEL
ANION GAP: 5 (ref 5–15)
BUN: 14 mg/dL (ref 6–20)
CO2: 31 mmol/L (ref 22–32)
Calcium: 10 mg/dL (ref 8.9–10.3)
Chloride: 105 mmol/L (ref 101–111)
Creatinine, Ser: 0.59 mg/dL (ref 0.44–1.00)
GFR calc Af Amer: >60 mL/min (ref >60)
GLUCOSE: 117 mg/dL — AB (ref 65–99)
POTASSIUM: 4 mmol/L (ref 3.5–5.1)
Sodium: 141 mmol/L (ref 135–145)

## 2016-10-31 LAB — CBC
HEMATOCRIT: 41.4 % (ref 36.0–46.0)
HEMOGLOBIN: 13.1 g/dL (ref 12.0–15.0)
MCH: 28.1 pg (ref 26.0–34.0)
MCHC: 31.6 g/dL (ref 30.0–36.0)
MCV: 88.7 fL (ref 78.0–100.0)
Platelets: 219 10*3/uL (ref 150–400)
RBC: 4.67 MIL/uL (ref 3.87–5.11)
RDW: 14.3 % (ref 11.5–15.5)
WBC: 8 10*3/uL (ref 4.0–10.5)

## 2016-10-31 LAB — I-STAT TROPONIN, ED: Troponin i, poc: 0 ng/mL (ref 0.00–0.08)

## 2016-10-31 MED ORDER — METOPROLOL TARTRATE 25 MG PO TABS
12.5000 mg | ORAL_TABLET | Freq: Once | ORAL | Status: AC
  Administered 2016-10-31: 12.5 mg via ORAL
  Filled 2016-10-31: qty 1

## 2016-10-31 MED ORDER — METOPROLOL TARTRATE 25 MG PO TABS: 12.5000 mg | ORAL_TABLET | Freq: Two times a day (BID) | ORAL | 0 refills | Status: DC

## 2016-10-31 NOTE — ED Notes (Signed)
Pt states she understands instructions. Pt home stable with family.

## 2016-10-31 NOTE — Telephone Encounter (Signed)
Pt called earlier today with irregular heart rate, felt different than anything she had noticed before, she was very nervous stated it felt like fluttering in her chest. Pulse would change with walking.  Instructed to go to Urgent care or ER for Eval. No hx of A fib -but symptoms concerning.

## 2016-10-31 NOTE — Telephone Encounter (Signed)
Patient showed up to ER with complaints of fluttering in her chest and was found to have PVCs on an EKG with some intermittent bigeminal PVCs.  Labs ok.  EKG otherwise normal.  Recommended starting Toprol XL 25mg  daily and followup with extender or Dr. Oval Linsey in the office next week.

## 2016-10-31 NOTE — Telephone Encounter (Signed)
Pt called with fluttering in her chest and HR that is up and down.  She is anxious and worried, no chest pain some dizziness has never had this before.  I have asked her to go to Urgent care or ER at [facility] maybe a fib and concern for CVA.  She is agreeable.

## 2016-10-31 NOTE — ED Triage Notes (Signed)
Patient complains of palpitations since 1pm this afternoon, denies CP but states that she can feel it fluttering. Arrived on oxygen at 2l for her COPD. Alert and oriented, NAD

## 2016-10-31 NOTE — ED Notes (Signed)
Pulse  Ox  97         Heart  Rate   107        Heather  Barnes  In  To  evaulate      Send  Pt  To  Er  now

## 2016-11-01 NOTE — ED Provider Notes (Signed)
[facility] DEPT Provider Note   CSN: [id number] Arrival date & time: 10/31/16  1540     History   Chief Complaint Chief Complaint  Patient presents with  . Palpitations    HPI Heather Barnes is a 83 y.o. female.  Patient has a history of COPD. At baseline, wears 2-3 L nasal cannula. She also has a history of frequent PVCs. Today, the patient reports that she felt some palpitations. Was otherwise asymptomatic. No chest pain, shortness of breath, lightheadedness. Denies any fevers or chills or infectious symptoms. She checked her pulse ox rate which told her her heart rate was in the 50s, so presented here.   The history is provided by the patient.  Illness  This is a new problem. Episode onset: 2-3 hours. Episode frequency: intermittentyl. The problem has not changed since onset.Pertinent negatives include no chest pain, no abdominal pain, no headaches and no shortness of breath. She has tried nothing for the symptoms.    Past Medical History:  Diagnosis Date  . Allergy    seasonal pollen  . Asthma   . Cataract    bilat.cataract/lens implant  . Colon adenomas   . COPD (chronic obstructive pulmonary disease) ([place])   . Coronary artery calcification 09/01/2016  . Goiter   . History of radiation therapy 05/24/11-06/02/11   R middle lobe lung/ 60 gray  . Hx of colonic polyps   . Hyperlipidemia   . Lung cancer, middle lobe ([place]) 04/26/2011  . Osteoporosis   . Palpitations 01/19/2016  . PVC (premature ventricular contraction) 02/24/2016  . Shortness of breath     Patient Active Problem List   Diagnosis Date Noted  . Coronary artery calcification 09/01/2016  . Bilateral low back pain with sciatica 05/10/2016  . Bronchopneumonia 05/10/2016  . CAP (community acquired pneumonia) 03/31/2016  . Chest pain 03/31/2016  . Tachycardia 03/31/2016  . PVC (premature ventricular contraction) 02/24/2016  . Palpitations 01/19/2016  . Respiratory failure ([place]) 06/11/2015  .  Acute on chronic respiratory failure ([place]) 06/11/2015  . Knee pain, right 07/02/2014  . Chronic respiratory failure with hypoxia ([place]) 07/02/2014  . COPD, very severe ([place]) 07/02/2014  . COPD exacerbation ([place]) 06/13/2013  . Right rib fracture 05/15/2012  . Fatigue 07/22/2011  . Oral thrush 07/22/2011  . Lung cancer, middle lobe ([place]) 04/26/2011  . COPD (chronic obstructive pulmonary disease) ([place]) 01/13/2011    Past Surgical History:  Procedure Laterality Date  . BIOPSY THYROID  2016  . COLONOSCOPY W/ BIOPSIES    . EYE SURGERY    . TONSILLECTOMY    . TOTAL ABDOMINAL HYSTERECTOMY      OB History    No data available       Home Medications    Prior to Admission medications   Medication Sig Start Date End Date Taking? Authorizing Provider  acetaminophen (TYLENOL) 500 MG tablet Take 1,000 mg by mouth every 6 (six) hours as needed for moderate pain.    [provider]  albuterol (PROVENTIL HFA;VENTOLIN HFA) 108 (90 Base) MCG/ACT inhaler Inhale 2 puffs into the lungs every 6 (six) hours as needed for wheezing or shortness of breath.    [provider]  BREO ELLIPTA 100-25 MCG/INH AEPB INHALE ONE PUFF INTO THE LUNGS ONCE DAILY 09/02/16   Mannam, Hart Robinsons, MD  cetirizine (ZYRTEC) 10 MG tablet Take 10 mg by mouth daily.      [provider]  Cholecalciferol (VITAMIN D) 2000 UNITS CAPS Take 2,000 Units by mouth 2 (two)  times daily.     [provider]  fluticasone (FLONASE) 50 MCG/ACT nasal spray Place 2 sprays into the nose daily as needed for allergies or rhinitis.     [provider]  fluticasone furoate-vilanterol (BREO ELLIPTA) 100-25 MCG/INH AEPB Inhale 1 puff into the lungs daily. 04/28/16   Brand Males, MD  INCRUSE ELLIPTA 62.5 MCG/INH AEPB INHALE ONE PUFF INTO LUNGS ONCE DAILY 08/19/16   Brand Males, MD  metoprolol tartrate (LOPRESSOR) 25 MG tablet Take 0.5 tablets (12.5 mg total) by mouth 2 (two) times daily. 10/31/16  11/30/16  Maryan Puls, MD  Omega-3 Fatty Acids (FISH OIL) 1000 MG CAPS Take 1,000 mg by mouth at bedtime.     [provider]  SINGULAIR 10 MG tablet Take 10 mg by mouth at bedtime.  12/29/10   [provider]  umeclidinium bromide (INCRUSE ELLIPTA) 62.5 MCG/INH AEPB Inhale 1 puff into the lungs daily. 04/28/16   Brand Males, MD    Family History Family History  Problem Relation Age of Onset  . Emphysema Maternal Uncle   . Heart disease Mother   . Heart disease Maternal Grandfather   . Rheum arthritis Sister   . Cancer Sister   . Cancer Sister        throat cancer  . Brain cancer Sister   . Cancer Sister   . Ovarian cancer Sister   . Breast cancer Daughter     Social History Social History  Substance Use Topics  . Smoking status: Former Smoker    Packs/day: 0.50    Years: 40.00    Types: Cigarettes    Quit date: 06/07/1998  . Smokeless tobacco: Never Used  . Alcohol use No     Allergies   Tiotropium bromide monohydrate; Codeine; Fosamax [alendronate]; Asa [aspirin]; and Latex   Review of Systems Review of Systems  Constitutional: Negative for chills and fever.  Respiratory: Negative for shortness of breath.   Cardiovascular: Positive for palpitations. Negative for chest pain and leg swelling.  Gastrointestinal: Negative for abdominal pain, nausea and vomiting.  Musculoskeletal: Negative for back pain.  Neurological: Negative for light-headedness and headaches.     Physical Exam Updated Vital Signs BP (!) 117/57 (BP Location: Right Arm)   Pulse 73   Temp 98.4 F (36.9 C) (Oral)   Resp 15   Ht 4\' 10"  (1.473 m)   Wt 52.2 kg (115 lb)   SpO2 100%   BMI 24.04 kg/m   Physical Exam  Constitutional: She appears well-developed and well-nourished. No distress.  HENT:  Head: Normocephalic and atraumatic.  Eyes: Conjunctivae are normal.  Neck: Neck supple.  Cardiovascular: Normal rate and regular rhythm.   No murmur  heard. Pulmonary/Chest: Effort normal and breath sounds normal. No respiratory distress.  Abdominal: Soft. There is no tenderness.  Musculoskeletal: She exhibits no edema.       Right lower leg: She exhibits no swelling and no edema.       Left lower leg: She exhibits no swelling and no edema.  Neurological: She is alert.  Skin: Skin is warm and dry.  Psychiatric: She has a normal mood and affect.  Nursing note and vitals reviewed.    ED Treatments / Results  Labs (all labs ordered are listed, but only abnormal results are displayed) Labs Reviewed  BASIC METABOLIC PANEL - Abnormal; Notable for the following:       Result Value   Glucose, Bld 117 (*)    All other components within normal  limits  CBC  I-STAT TROPOININ, ED    EKG  EKG Interpretation  Date/Time:  Sunday Oct 31 2016 15:52:37 EDT Ventricular Rate:  102 PR Interval:  134 QRS Duration: 66 QT Interval:  322 QTC Calculation: 419 R Axis:   51 Text Interpretation:  Sinus tachycardia with frequent Premature ventricular complexes in a pattern of bigeminy Otherwise normal ECG Confirmed by Ashok Cordia  MD, Lennette Bihari ([id number]) on 10/31/2016 5:12:59 PM       Radiology Dg Chest 2 View  Result Date: 10/31/2016 CLINICAL DATA:  Palpitations since 1300 hours today, fluttering, chronic shortness of breath due to COPD EXAM: CHEST  2 VIEW COMPARISON:  08/10/2016 FINDINGS: Borderline enlargement of cardiac silhouette. Mediastinal contours and pulmonary vascularity normal. Emphysematous and minimal bronchitic changes compatible with COPD. Bibasilar scarring. No acute infiltrate, pleural effusion or pneumothorax. Diffuse osseous demineralization with chronic compression fractures of 2 lower thoracic vertebra. IMPRESSION: COPD changes with bibasilar scarring. Chronic lower thoracic spine compression fractures. No acute abnormalities. Electronically Signed   By: Lavonia Dana M.D.   On: 10/31/2016 16:43    Procedures Procedures (including critical  care time)  Medications Ordered in ED Medications  metoprolol tartrate (LOPRESSOR) tablet 12.5 mg (12.5 mg Oral Given 10/31/16 1901)     Initial Impression / Assessment and Plan / ED Course  I have reviewed the triage vital signs and the nursing notes.  Pertinent labs & imaging results that were available during my care of the patient were reviewed by me and considered in my medical decision making (see chart for details).     On arrival here the patient is well-appearing and in acute distress. Vital signs are normal and stable. Patient is on her home 2 L nasal cannula. Lungs are clear to auscultation bilaterally. EKG shows evidence of bigeminy. By the time I evaluated the patient, the patient is back in a normal sinus rhythm. After discussion of the case with the patient's cardiologist, see that the patient does have a history of frequent PVCs. She denies any other symptoms. Denies chest pain, lightheadedness, shortness of breath. Troponin here negative. Electro lites within normal limits. No anemia. No leukocytosis. Afebrile. Infectious process.  After discussion with on-call cardiology, Dr. Radford Pax, feel the patient is appropriate for discharge. At her request, starting the patient on low-dose metoprolol. Gave her the first dose here. Told her to call her cardiologist in follow-up as soon as possible on an outpatient basis.  Final Clinical Impressions(s) / ED Diagnoses   Final diagnoses:  Palpitations  PVC (premature ventricular contraction)    New Prescriptions Discharge Medication List as of 10/31/2016  6:10 PM    START taking these medications   Details  metoprolol tartrate (LOPRESSOR) 25 MG tablet Take 0.5 tablets (12.5 mg total) by mouth 2 (two) times daily., Starting Sun 10/31/2016, Until Tue 11/30/2016, Print         Maryan Puls, MD 11/01/16 [facility]    Lajean Saver, MD 11/03/16 [phone]

## 2016-11-02 DIAGNOSIS — I2510 Atherosclerotic heart disease of native coronary artery without angina pectoris: Secondary | ICD-10-CM | POA: Diagnosis not present

## 2016-11-02 DIAGNOSIS — J449 Chronic obstructive pulmonary disease, unspecified: Secondary | ICD-10-CM | POA: Diagnosis not present

## 2016-11-02 DIAGNOSIS — J309 Allergic rhinitis, unspecified: Secondary | ICD-10-CM | POA: Diagnosis not present

## 2016-11-02 DIAGNOSIS — J9611 Chronic respiratory failure with hypoxia: Secondary | ICD-10-CM | POA: Diagnosis not present

## 2016-11-02 DIAGNOSIS — M810 Age-related osteoporosis without current pathological fracture: Secondary | ICD-10-CM | POA: Diagnosis not present

## 2016-11-02 DIAGNOSIS — E785 Hyperlipidemia, unspecified: Secondary | ICD-10-CM | POA: Diagnosis not present

## 2016-11-02 DIAGNOSIS — C3490 Malignant neoplasm of unspecified part of unspecified bronchus or lung: Secondary | ICD-10-CM | POA: Diagnosis not present

## 2016-11-02 DIAGNOSIS — R002 Palpitations: Secondary | ICD-10-CM | POA: Diagnosis not present

## 2016-11-02 DIAGNOSIS — M4854XA Collapsed vertebra, not elsewhere classified, thoracic region, initial encounter for fracture: Secondary | ICD-10-CM | POA: Diagnosis not present

## 2016-11-02 DIAGNOSIS — H9193 Unspecified hearing loss, bilateral: Secondary | ICD-10-CM | POA: Diagnosis not present

## 2016-11-02 MED ORDER — METOPROLOL SUCCINATE ER 25 MG PO TB24: 25.0000 mg | ORAL_TABLET | Freq: Every day | ORAL | 3 refills | Status: DC

## 2016-11-02 NOTE — Telephone Encounter (Signed)
Will route to Dr. Oval Linsey at [facility] and his nurse for further review and follow up.

## 2016-11-02 NOTE — Addendum Note (Signed)
Addended by: Laurel Dimmer on: 11/02/2016 08:36 AM   Modules accepted: Orders

## 2016-11-02 NOTE — Telephone Encounter (Signed)
Patient scheduled for 11/04/16

## 2016-11-04 ENCOUNTER — Ambulatory Visit: Payer: Medicare Other | Admitting: Cardiovascular Disease

## 2016-11-04 ENCOUNTER — Telehealth: Payer: Self-pay | Admitting: Cardiovascular Disease

## 2016-11-04 NOTE — Telephone Encounter (Signed)
Patient calling, states that she has an appt to see Dr. Chase Caller on 11-10-16 and would like to know if she could cancel her appt with [facility] 11-15-16. Patient states that she has hard time with finding transportation. Thanks.

## 2016-11-04 NOTE — Telephone Encounter (Signed)
Spoke to patient. Recommend to keep appointment if needeD can cancel after seeing Dr Chase Caller. PATIENT VOICED UNDERSTANDING.

## 2016-11-10 ENCOUNTER — Ambulatory Visit (INDEPENDENT_AMBULATORY_CARE_PROVIDER_SITE_OTHER): Payer: Medicare Other | Admitting: Internal Medicine

## 2016-11-10 ENCOUNTER — Encounter: Payer: Self-pay | Admitting: Internal Medicine

## 2016-11-10 DIAGNOSIS — I2510 Atherosclerotic heart disease of native coronary artery without angina pectoris: Secondary | ICD-10-CM | POA: Diagnosis not present

## 2016-11-10 DIAGNOSIS — I2584 Coronary atherosclerosis due to calcified coronary lesion: Secondary | ICD-10-CM | POA: Diagnosis not present

## 2016-11-10 DIAGNOSIS — J449 Chronic obstructive pulmonary disease, unspecified: Secondary | ICD-10-CM | POA: Diagnosis not present

## 2016-11-10 DIAGNOSIS — C342 Malignant neoplasm of middle lobe, bronchus or lung: Secondary | ICD-10-CM | POA: Diagnosis not present

## 2016-11-10 NOTE — Patient Instructions (Signed)
COPD, very severe Stable diseae Pulse ox 88% room air at rest  Plan - continue o2 as scheduled  - use albuterol as needed  - continue incruse and breo as scheduled  Followup 6 months or sooner   Lung cancer, middle lobe (HCC) Do 1 year surveillance ct chest wo cotnrast anytime next few weeks  Will call with results  Also Please talk to PCP Heather Caraway, MD -  and ensure you talk about new  shingarix vaccine

## 2016-11-10 NOTE — Assessment & Plan Note (Signed)
Stable diseae Pulse ox 88% room air at rest  Plan - continue o2 as scheduled  - use albuterol as needed  - continue incruse and breo as scheduled  Followup 6 months or sooner

## 2016-11-10 NOTE — Assessment & Plan Note (Signed)
Do 1 year surveillance ct chest wo cotnrast anytime next few weeks  Will call with results

## 2016-11-10 NOTE — Progress Notes (Signed)
Subjective:     Patient ID: Heather Barnes, female   DOB: 1934/04/02, 83 y.o.   MRN: [id number]  HPI OV 05/10/2016  Chief Complaint  Patient presents with  . Follow-up    Pt here for 20month f/u. Pt states she had pna in October but states she feel back to baseline. Pt c/o mild dry cough, PND. Pt denies CP/tightness.    Follow-up Gold stage IV COPD with chronic hypoxic respiratory failure  83 year old female. This is a follow-up from summer 2017. In October 2017 she was admitted for left upper lobe pneumonia. Chest x-ray was reviewed. She is back to stay on triple inhaler therapy. She continues on oxygen. Overall she's stable. There are no new issues with COPD standpoint. In the interim she's developed low back pain without any bladder or bowel disturbance. She's had 2 emergency department visits 04/24/2016 and 04/27/2016. The second one did a chest x-ray and she has T10 and T12 wedge compression fracture. This is severe and disabling her. She has an orthopedic appointment in a few days. She is taking appropriate pain medications.  OV 08/10/2016  Chief Complaint  Patient presents with  . Follow-up    f/u 3 months COPD, more SOB last couple of days, wheezing      Follow-up Gold stage IV COPD with chronic hypoxic respiratory failure  She is now 86. This is a routine follow-up. She is to follow-up back pain but it is better. Last visit December 2017. A follow-up chest x-ray for pneumonia and that showed improvement in infiltrates but she's not had a further follow-up of the chest x-ray. Now she is telling me for the last few days she's had increased sinus drainage, wheezing, chest tightness, cough that is worse than baseline. This no fever or edema or hemoptysis  OV 11/10/2016  Chief Complaint  Patient presents with  . Follow-up    Pt c/o slight increase in wheezing, increase in SOB, mild dry cough. Pt states this is due to the pollen. Pt denies CP/tightness and f/c/s.      Follow-up  Gold stage IV COPD with chronic hypoxic respiratory failure Follow-up lung cancer surveillance   COPD: This is stable. She is on triple inhaler therapy. She is on oxygen. Today she needs a qualify oxygen saturation test. Her room a pulse ox is 88% on room air at rest. She is compliant with her inhalers. There are no new complaints.  Lung cancer surveillance: Last CT scan of the chest  was 2017 summer. She is interested in doing follow-up CT scan of the chest for recurrence surveillance this summer 2018  Past medical history: New issues that she had ventricular bigemini according to her history and was in the emergency department. She has follow-up with Dr. Skeet Latch upcoming she is asking if she can skip it because she has a grandson's graduation. She feels fine. Troponin checked in the ER was normal. This is based on my review of the chart.         has a past medical history of Allergy; Asthma; Cataract; Colon adenomas; COPD (chronic obstructive pulmonary disease) ([place]); Coronary artery calcification (09/01/2016); Goiter; History of radiation therapy (05/24/11-06/02/11); colonic polyps; Hyperlipidemia; Lung cancer, middle lobe ([place]) (04/26/2011); Osteoporosis; Palpitations (01/19/2016); PVC (premature ventricular contraction) (02/24/2016); and Shortness of breath.   reports that she quit smoking about 18 years ago. Her smoking use included Cigarettes. She has a 20.00 pack-year smoking history. She has never used smokeless tobacco.  Past Surgical History:  Procedure  Laterality Date  . BIOPSY THYROID  2016  . COLONOSCOPY W/ BIOPSIES    . EYE SURGERY    . TONSILLECTOMY    . TOTAL ABDOMINAL HYSTERECTOMY      Allergies  Allergen Reactions  . Tiotropium Bromide Monohydrate Hives, Shortness Of Breath and Rash    Spiriva   . Codeine Other (See Comments)    Hyperactivity,crying  . Fosamax [Alendronate] Other (See Comments)    Muscle aches  . Asa [Aspirin] Other (See Comments)     Bleeding -ulcers  . Latex Rash    Immunization History  Administered Date(s) Administered  . Influenza Split 04/07/2013, 03/07/2014  . Influenza Whole 02/06/2011, 03/07/2012  . Influenza, High Dose Seasonal PF 03/07/2016  . Influenza,inj,Quad PF,36+ Mos 01/27/2015  . Influenza-Unspecified 01/05/2014  . Pneumococcal Conjugate-13 10/05/2013  . Pneumococcal Polysaccharide-23 06/08/2003  . Pneumococcal-Unspecified 11/05/2012    Family History  Problem Relation Age of Onset  . Emphysema Maternal Uncle   . Heart disease Mother   . Heart disease Maternal Grandfather   . Rheum arthritis Sister   . Cancer Sister   . Cancer Sister        throat cancer  . Brain cancer Sister   . Cancer Sister   . Ovarian cancer Sister   . Breast cancer Daughter      Current Outpatient Prescriptions:  .  albuterol (PROVENTIL HFA;VENTOLIN HFA) 108 (90 Base) MCG/ACT inhaler, Inhale 2 puffs into the lungs every 6 (six) hours as needed for wheezing or shortness of breath., Disp: , Rfl:  .  BREO ELLIPTA 100-25 MCG/INH AEPB, INHALE ONE PUFF INTO THE LUNGS ONCE DAILY, Disp: 60 each, Rfl: 11 .  cetirizine (ZYRTEC) 10 MG tablet, Take 10 mg by mouth daily.  , Disp: , Rfl:  .  Cholecalciferol (VITAMIN D) 2000 UNITS CAPS, Take 2,000 Units by mouth 2 (two) times daily. , Disp: , Rfl:  .  fluticasone (FLONASE) 50 MCG/ACT nasal spray, Place 2 sprays into the nose daily as needed for allergies or rhinitis. , Disp: , Rfl:  .  INCRUSE ELLIPTA 62.5 MCG/INH AEPB, INHALE ONE PUFF INTO LUNGS ONCE DAILY, Disp: 30 each, Rfl: 5 .  metoprolol tartrate (LOPRESSOR) 25 MG tablet, Take 12.5 mg by mouth 2 (two) times daily., Disp: , Rfl:  .  Omega-3 Fatty Acids (FISH OIL) 1000 MG CAPS, Take 1,000 mg by mouth at bedtime. , Disp: , Rfl:  .  SINGULAIR 10 MG tablet, Take 10 mg by mouth at bedtime. , Disp: , Rfl:     Review of Systems     Objective:   Physical Exam  Constitutional: She is oriented to person, place, and time. She  appears well-developed and well-nourished. No distress.  Overall frail baseline  HENT:  Head: Normocephalic and atraumatic.  Right Ear: External ear normal.  Left Ear: External ear normal.  Mouth/Throat: Oropharynx is clear and moist. No oropharyngeal exudate.  Eyes: Conjunctivae and EOM are normal. Pupils are equal, round, and reactive to light. Right eye exhibits no discharge. Left eye exhibits no discharge. No scleral icterus.  Neck: Normal range of motion. Neck supple. No JVD present. No tracheal deviation present. No thyromegaly present.  Cardiovascular: Normal rate, regular rhythm, normal heart sounds and intact distal pulses.  Exam reveals no gallop and no friction rub.   No murmur heard. Pulmonary/Chest: Effort normal and breath sounds normal. No respiratory distress. She has no wheezes. She has no rales. She exhibits no tenderness.  Barrel chest with pursed lip  breathing and oxygen on  Abdominal: Soft. Bowel sounds are normal. She exhibits no distension and no mass. There is no tenderness. There is no rebound and no guarding.  Musculoskeletal: Normal range of motion. She exhibits no edema or tenderness.  Lymphadenopathy:    She has no cervical adenopathy.  Neurological: She is alert and oriented to person, place, and time. She has normal reflexes. No cranial nerve deficit. She exhibits normal muscle tone. Coordination normal.  Skin: Skin is warm and dry. No rash noted. She is not diaphoretic. No erythema. No pallor.  Psychiatric: She has a normal mood and affect. Her behavior is normal. Judgment and thought content normal.  Vitals reviewed.    Vitals:   11/10/16 0902  BP: 110/62  Pulse: 61  SpO2: 98%  Weight: 116 lb 12.8 oz (53 kg)  Height: 4\' 10"  (1.473 m)    Estimated body mass index is 24.41 kg/m as calculated from the following:   Height as of this encounter: 4\' 10"  (1.473 m).   Weight as of this encounter: 116 lb 12.8 oz (53 kg).      Assessment:       ICD-10-CM    1. COPD, very severe ([place]) J44.9   2. Lung cancer, middle lobe (HCC) C34.2        Plan:     COPD, very severe Stable diseae Pulse ox 88% room air at rest  Plan - continue o2 as scheduled  - use albuterol as needed  - continue incruse and breo as scheduled  Followup 6 months or sooner   Lung cancer, middle lobe ([place]) Do 1 year surveillance ct chest wo cotnrast anytime next few weeks  Will call with results   Dr. Brand Males, M.D., [facility].C.P Pulmonary and Critical Care Medicine Staff Physician [facility] Pager: [phone], If no answer or between  15:00h - 7:00h: call [phone]  11/10/2016 9:27 AM

## 2016-11-15 ENCOUNTER — Ambulatory Visit: Payer: Medicare Other | Admitting: Student

## 2016-11-18 ENCOUNTER — Other Ambulatory Visit: Payer: Medicare Other

## 2016-11-21 NOTE — Progress Notes (Signed)
Cardiology Office Note    Date:  11/23/2016   ID:  Heather, Barnes July 23, 1933, MRN [id number]  PCP:  [name], MD  Cardiologist: Dr. Oval Linsey  Chief Complaint  Patient presents with  . Follow-up    Recent Emergency Dept visit    History of Present Illness:    Heather Barnes is a 83 y.o. female with past medical history of coronary calcifications, HLD (statin intolerant), PVC's, COPD (on 2L [place]), and history of lung cancer who presents to the office today for evaluation of palpitations.   She was last examined by Dr. Oval Linsey in 08/2016 and reported frequent palpitations occurring throughout the year with a monitor at that time showing PVC's with one episode of ventricular quadrigeminy. She called the answering service on 10/31/2016 and reported episodes of fluttering in her chest with associated dizziness, therefore it was recommended she go to the ED for further evaluation. Her EKG showed sinus tachycardia with PVC's, therefore it was recommended to start Lopressor 12.5mg  BID and follow-up with Cardiology as an outpatient.   In talking with the patient today, she reports significant improvement in her palpitations since been started on Lopressor. She initially experienced mild fatigue and diarrhea with this within the first few days but the symptoms have significantly improved. She reports her dyspnea is at baseline (on 2L nasal cannula at all times).  She denies any repeat episodes of dizziness or presyncope. No recent chest pain, orthopnea, PND, or lower extremity edema.  Past Medical History:  Diagnosis Date  . Allergy    seasonal pollen  . Asthma   . Cataract    bilat.cataract/lens implant  . Colon adenomas   . COPD (chronic obstructive pulmonary disease) ([place])   . Coronary artery calcification 09/01/2016  . Goiter   . History of radiation therapy 05/24/11-06/02/11   R middle lobe lung/ 60 gray  . Hx of colonic polyps   . Hyperlipidemia   . Lung cancer,  middle lobe ([place]) 04/26/2011  . Osteoporosis   . Palpitations 01/19/2016   a. event monitor in 01/2016 showed PVC's with one episode of ventricular quadrigeminy.  [name] PVC (premature ventricular contraction) 02/24/2016  . Shortness of breath     Past Surgical History:  Procedure Laterality Date  . BIOPSY THYROID  2016  . COLONOSCOPY W/ BIOPSIES    . EYE SURGERY    . TONSILLECTOMY    . TOTAL ABDOMINAL HYSTERECTOMY      Current Medications: Outpatient Medications Prior to Visit  Medication Sig Dispense Refill  . albuterol (PROVENTIL HFA;VENTOLIN HFA) 108 (90 Base) MCG/ACT inhaler Inhale 2 puffs into the lungs every 6 (six) hours as needed for wheezing or shortness of breath.    [name] BREO ELLIPTA 100-25 MCG/INH AEPB INHALE ONE PUFF INTO THE LUNGS ONCE DAILY 60 each 11  . cetirizine (ZYRTEC) 10 MG tablet Take 10 mg by mouth daily.      . Cholecalciferol (VITAMIN D) 2000 UNITS CAPS Take 2,000 Units by mouth 2 (two) times daily.     . fluticasone (FLONASE) 50 MCG/ACT nasal spray Place 2 sprays into the nose daily as needed for allergies or rhinitis.     . INCRUSE ELLIPTA 62.5 MCG/INH AEPB INHALE ONE PUFF INTO LUNGS ONCE DAILY 30 each 5  . Omega-3 Fatty Acids (FISH OIL) 1000 MG CAPS Take 1,000 mg by mouth at bedtime.     [name] SINGULAIR 10 MG tablet Take 10 mg by mouth at bedtime.     . metoprolol  tartrate (LOPRESSOR) 25 MG tablet Take 12.5 mg by mouth 2 (two) times daily.     No facility-administered medications prior to visit.      Allergies:   Tiotropium bromide monohydrate; Codeine; Fosamax [alendronate]; Asa [aspirin]; and Latex   Social History   Social History  . Marital status: Widowed    Spouse name: N/A  . Number of children: N/A  . Years of education: N/A   Occupational History  . retired    Social History Main Topics  . Smoking status: Former Smoker    Packs/day: 0.50    Years: 40.00    Types: Cigarettes    Quit date: 06/07/1998  . Smokeless tobacco: Never Used  . Alcohol  use No  . Drug use: No  . Sexual activity: Not Asked   Other Topics Concern  . None   Social History Narrative   On 03/17/11: expressed concerns about harmful effects of XRT based on dtr;s experience with the same with breast cancer     Family History:  The patient's family history includes Brain cancer in her sister; Breast cancer in her daughter; Cancer in her sister, sister, and sister; Emphysema in her maternal uncle; Heart disease in her maternal grandfather and mother; Ovarian cancer in her sister; Rheum arthritis in her sister.   Review of Systems:   Please see the history of present illness.     General:  No chills, fever, night sweats or weight changes.  Cardiovascular:  No chest pain, dyspnea on exertion, edema, orthopnea, paroxysmal nocturnal dyspnea. Positive for palpitations.  Dermatological: No rash, lesions/masses Respiratory: No cough, Positive for dyspnea (at baseline). Urologic: No hematuria, dysuria Abdominal:   No nausea, vomiting, diarrhea, bright red blood per rectum, melena, or hematemesis Neurologic:  No visual changes, wkns, changes in mental status. All other systems reviewed and are otherwise negative except as noted above.   Physical Exam:    VS:  BP 118/64   Pulse 66   Ht 4\' 10"  (1.473 m)   Wt 117 lb 3.2 oz (53.2 kg)   SpO2 95%   BMI 24.49 kg/m    General: Well developed, elderly Caucasian female appearing in no acute distress. Head: Normocephalic, atraumatic, sclera non-icteric, no xanthomas, nares are without discharge.  Neck: No carotid bruits. JVD not elevated.  Lungs: Respirations regular and unlabored, with diffuse rhonchi. No wheezing or rales appreciated.   Heart: Regular rate and rhythm. No S3 or S4.  No murmur, no rubs, or gallops appreciated. Abdomen: Soft, non-tender, non-distended with normoactive bowel sounds. No hepatomegaly. No rebound/guarding. No obvious abdominal masses. Msk:  Strength and tone appear normal for age. No joint  deformities or effusions. Extremities: No clubbing or cyanosis. No lower extremity edema.  Distal pedal pulses are 2+ bilaterally. Neuro: Alert and oriented X 3. Moves all extremities spontaneously. No focal deficits noted. Psych:  Responds to questions appropriately with a normal affect. Skin: No rashes or lesions noted  Wt Readings from Last 3 Encounters:  11/23/16 117 lb 3.2 oz (53.2 kg)  11/10/16 116 lb 12.8 oz (53 kg)  10/31/16 115 lb (52.2 kg)      Studies/Labs Reviewed:   EKG:  EKG is ordered today.  The ekg ordered today demonstrates NSR, HR 66, with no acute ST or T-wave changes.   Recent Labs: 01/21/2016: Magnesium 2.0; TSH 1.13 04/03/2016: ALT 27 10/31/2016: BUN 14; Creatinine, Ser 0.59; Hemoglobin 13.1; Platelets 219; Potassium 4.0; Sodium 141   Lipid Panel    Component Value  Date/Time   CHOL 186 01/21/2016 1126   TRIG 97 01/21/2016 1126   HDL 86 01/21/2016 1126   CHOLHDL 2.2 01/21/2016 1126   VLDL 19 01/21/2016 1126   LDLCALC 81 01/21/2016 1126    Additional studies/ records that were reviewed today include:   Event Monitor: 01/21/2016 7 Day Event Monitor  Quality: Fair.  Baseline artifact. Predominant rhythm: sinus  Average heart rate: 77 bpm Max heart rate: 110 bpm Min heart rate: 64 bpm  Occasional PVCs, with an episode of ventricular quadrigeminy.  Assessment:    1. Palpitations   2. PVC (premature ventricular contraction)   3. Coronary artery calcification   4. Chronic respiratory failure with hypoxia (HCC)      Plan:   In order of problems listed above:  1. Palpitations/ PVC's - she has a prolonged history of palpitations with her event monitor in 01/2016 showing PVC's with one episode of ventricular quadrigeminy. - she was evaluated in the ED on 10/31/2016 for reported episodes of fluttering in her chest with associated dizziness. EKG showed sinus tachycardia with PVC's, therefore it was recommended to start Lopressor 12.5mg  BID and  follow-up with Cardiology as an outpatient.  - she reports significant improvement in her palpitations since starting this. Initially experienced nausea and diarrhea with the medication but these side-effects spontaneously resolved within a few days.  - continue with Lopressor 12.5mg  BID as BP and HR remain stable (118/64; HR at 66). Could consider switching to Bisoprolol if she experiences any acute changes in her respiratory status.   2. Coronary Calcifications - noted on prior CT Imaging. She denies any recent anginal symptoms. - continue BB therapy. Intolerant to ASA and statins.   3. COPD - remains on 2L nasal cannula at baseline. - followed by Dr. Chase Caller.   Medication Adjustments/Labs and Tests Ordered: Current medicines are reviewed at length with the patient today.  Concerns regarding medicines are outlined above.  Medication changes, Labs and Tests ordered today are listed in the Patient Instructions below. Patient Instructions  Medication Instructions:  Your physician recommends that you continue on your current medications as directed. Please refer to the Current Medication list given to you today.  If you need a refill on your cardiac medications before your next appointment, please call your pharmacy.  Follow-Up: Your physician wants you to follow-up in: [place] You will receive a reminder letter in the mail two months in advance. If you don't receive a letter, please call our office OCTOBER to schedule the DECEMBER follow-up appointment.  Thank you for choosing [facility] at [employer], Erma Heritage, PA-C  11/23/2016 1:12 PM    [facility] [place], [place], [place]  [address] Phone: [phone]; Fax: [phone]  [address] [place], [place] [address] Phone: [phone]

## 2016-11-23 ENCOUNTER — Ambulatory Visit (INDEPENDENT_AMBULATORY_CARE_PROVIDER_SITE_OTHER): Payer: Medicare Other | Admitting: Student

## 2016-11-23 ENCOUNTER — Encounter: Payer: Self-pay | Admitting: Student

## 2016-11-23 VITALS — BP 118/64 | HR 66 | Ht <= 58 in | Wt 117.2 lb

## 2016-11-23 DIAGNOSIS — R002 Palpitations: Secondary | ICD-10-CM

## 2016-11-23 DIAGNOSIS — I2584 Coronary atherosclerosis due to calcified coronary lesion: Secondary | ICD-10-CM

## 2016-11-23 DIAGNOSIS — I2510 Atherosclerotic heart disease of native coronary artery without angina pectoris: Secondary | ICD-10-CM

## 2016-11-23 DIAGNOSIS — I493 Ventricular premature depolarization: Secondary | ICD-10-CM | POA: Diagnosis not present

## 2016-11-23 DIAGNOSIS — J9611 Chronic respiratory failure with hypoxia: Secondary | ICD-10-CM | POA: Diagnosis not present

## 2016-11-23 MED ORDER — METOPROLOL TARTRATE 25 MG PO TABS: 12.5000 mg | ORAL_TABLET | Freq: Two times a day (BID) | ORAL | 1 refills | Status: DC

## 2016-11-23 NOTE — Patient Instructions (Signed)
Medication Instructions:  Your physician recommends that you continue on your current medications as directed. Please refer to the Current Medication list given to you today.  If you need a refill on your cardiac medications before your next appointment, please call your pharmacy.  Follow-Up: Your physician wants you to follow-up in: [place] You will receive a reminder letter in the mail two months in advance. If you don't receive a letter, please call our office OCTOBER to schedule the DECEMBER follow-up appointment.  Thank you for choosing [facility] at [facility]!!

## 2016-12-15 DIAGNOSIS — H903 Sensorineural hearing loss, bilateral: Secondary | ICD-10-CM | POA: Diagnosis not present

## 2016-12-22 ENCOUNTER — Ambulatory Visit (INDEPENDENT_AMBULATORY_CARE_PROVIDER_SITE_OTHER)
Admission: RE | Admit: 2016-12-22 | Discharge: 2016-12-22 | Disposition: A | Discharge status: Home / Self Care | Payer: Medicare Other | Source: Ambulatory Visit | Attending: Internal Medicine | Admitting: Internal Medicine

## 2016-12-22 DIAGNOSIS — C342 Malignant neoplasm of middle lobe, bronchus or lung: Secondary | ICD-10-CM

## 2016-12-31 ENCOUNTER — Other Ambulatory Visit: Payer: Self-pay | Admitting: Internal Medicine

## 2016-12-31 DIAGNOSIS — R911 Solitary pulmonary nodule: Secondary | ICD-10-CM

## 2016-12-31 NOTE — Progress Notes (Signed)
Called and spoke with pt. Informed her of the results and recs per MR. Pt verbalized understanding and denied any further questions or concerns at this time.

## 2017-02-08 ENCOUNTER — Other Ambulatory Visit: Payer: Self-pay | Admitting: Internal Medicine

## 2017-02-11 DIAGNOSIS — Z803 Family history of malignant neoplasm of breast: Secondary | ICD-10-CM | POA: Diagnosis not present

## 2017-02-11 DIAGNOSIS — Z1231 Encounter for screening mammogram for malignant neoplasm of breast: Secondary | ICD-10-CM | POA: Diagnosis not present

## 2017-02-15 DIAGNOSIS — R928 Other abnormal and inconclusive findings on diagnostic imaging of breast: Secondary | ICD-10-CM | POA: Diagnosis not present

## 2017-03-07 ENCOUNTER — Other Ambulatory Visit: Payer: Self-pay | Admitting: Radiology

## 2017-03-07 DIAGNOSIS — R920 Mammographic microcalcification found on diagnostic imaging of breast: Secondary | ICD-10-CM | POA: Diagnosis not present

## 2017-03-07 DIAGNOSIS — D0591 Unspecified type of carcinoma in situ of right breast: Secondary | ICD-10-CM | POA: Diagnosis not present

## 2017-03-07 DIAGNOSIS — D0511 Intraductal carcinoma in situ of right breast: Secondary | ICD-10-CM | POA: Diagnosis not present

## 2017-03-07 HISTORY — PX: BREAST BIOPSY: SHX20

## 2017-03-08 ENCOUNTER — Telehealth: Payer: Self-pay | Admitting: Internal Medicine

## 2017-03-08 NOTE — Telephone Encounter (Signed)
Spoke with pt, she states she just wants to speak to MR about this diagnosis. She wants to know if they is anything she needs to know before she goes to this meeting next week. Does she need to ask questions about anything, etc? MR please call pt. She states she will be home so you can call her anytime.

## 2017-03-09 NOTE — Telephone Encounter (Signed)
Routing to MR. 

## 2017-03-10 ENCOUNTER — Telehealth: Payer: Self-pay | Admitting: Oncology

## 2017-03-10 ENCOUNTER — Encounter: Payer: Self-pay | Admitting: *Deleted

## 2017-03-10 DIAGNOSIS — D0511 Intraductal carcinoma in situ of right breast: Secondary | ICD-10-CM

## 2017-03-10 NOTE — Telephone Encounter (Signed)
Patient confirmed [facility] appointment for 10/10, [facility] patient no paperwork sent, letter sent for reminder and location

## 2017-03-11 ENCOUNTER — Encounter: Payer: Self-pay | Admitting: Oncology

## 2017-03-11 NOTE — Telephone Encounter (Signed)
Spoke to patient. Advised she is in excellent hands with Dr Jana Hakim. HAppy to talk to  Dr Jana Hakim after 03/16/17 after her visit if she desires.  Dr. Brand Males, M.D., [facility].C.P Pulmonary and Critical Care Medicine Staff Physician, [place] Director - Interstitial Lung Disease  Pulmonary [place] [facility], [place], [address]  Pager: [phone], If no answer or between  15:00h - 7:00h: call [phone] Telephone: [phone]

## 2017-03-11 NOTE — Telephone Encounter (Signed)
Per patient's chart she is scheduled for an oncology appt on 10.10.18 and is requesting to speak with MR prior to this appt about her newly diagnosed breast cancer.  Pt did state that she received a call about the appt on yesterday and was asked which providers she would like to be made aware of her diagnosis/treatment, she mentioned MR and was told that MR may be on the panel of physicians.  Patient would like to verify.  MR please call patient at your convenience to discuss

## 2017-03-14 ENCOUNTER — Encounter: Payer: Self-pay | Admitting: Hematology and Oncology

## 2017-03-14 DIAGNOSIS — Z23 Encounter for immunization: Secondary | ICD-10-CM | POA: Diagnosis not present

## 2017-03-14 NOTE — Progress Notes (Signed)
[facility]  [phone] [phone]     ID: Holy Battenfield DOB: [birth year]  MR#: [id number]  JKK#:[id number]  Patient Care Team: [name], MD as PCP - General (Family Medicine) Brand Males, MD as Consulting Physician (Pulmonary Disease) Rolm Bookbinder, MD as Consulting Physician (General Surgery) Allysson Rinehimer, Virgie Dad, MD as Consulting Physician (Oncology) Gery Pray, MD as Consulting Physician (Radiation Oncology) Skeet Latch, MD as Attending Physician (Cardiology) Gerda Diss, DO as Consulting Physician (Family Medicine) OTHER MD:  CHIEF COMPLAINT: Ductal carcinoma in situ  CURRENT TREATMENT: Awaiting definitive surgery   HISTORY OF CURRENT ILLNESS: Heather Barnes had routine bilateral screening mammography at [facility] 02/11/2017 showing the breast density to be category B. There were pleomorphic calcifications noted in the right breast upper outer quadrant and she was recalled for right diagnostic mammography 02/15/2017. This confirmed the finding and on 03/07/2017 she underwent right breast upper outer quadrant biopsy showing ([id number]) ductal carcinoma in situ, high-grade, weakly estrogen receptor positive at 5%, progesterone receptor negative. On the same day she had right axillary ultrasonography which showed no suspicious axillary lymph nodes  The patient also has a history of right middle lobe lung cancer, 0.8 cm on CT of the chest 12/25/2010, status post right middle lobe biopsy 04/14/2011, showing ([id number]) non-small cell lung cancer most consistent with squamous cell, treated with radiation only (60 gray, in 5 fractions, given between 05/24/2011 and 06/02/2011) (, with CT scan of the chest 12/22/2016 showing no evidence of local recurrence in the right middle lobe, but a new 0.4 cm left upper lobe solid nodule warranting repeat chest CT in 6 months.  The patient's subsequent history is as detailed below.  INTERVAL  HISTORY: Aubre was evaluated in the multidisciplinary  Breast cancer clinic 03/16/2017 accompanied by her daughter [name] and her friend and hpnorarySr. Carole. Her case was also presented at the multidisciplinary breast cancer conference on the same day. At that time a preliminary plan was proposed: Breast conserving surgery, with no sentinel lymph node sampling; possible radiation treatment, possible anti-estrogens, and genetics referral.   REVIEW OF SYSTEMS: Valda reports that when she feels well, she cooks in her kitchen. She is severely limited because of her COPD. She tells me she had pneumonia twice last year, one time requiring ICU support and intubation.She no longer drives. The patient denies unusual headaches, visual changes, nausea, vomiting, stiff neck, dizziness, or gait imbalance. There has been no cough, phlegm production, or pleurisyrecently, no chest pain or pressure, and no change in bowel or bladder habits. The patient denies fever, rash, bleeding, unexplained fatigue or unexplained weight loss. A detailed review of systems was otherwise entirely negative.  PAST MEDICAL HISTORY: Past Medical History:  Diagnosis Date  . Allergy    seasonal pollen  . Asthma   . Cataract    bilat.cataract/lens implant  . Colon adenomas   . COPD (chronic obstructive pulmonary disease) ([place])   . Coronary artery calcification 09/01/2016  . Goiter   . History of radiation therapy 05/24/11-06/02/11   R middle lobe lung/ 60 gray  . Hx of colonic polyps   . Hyperlipidemia   . Lung cancer, middle lobe ([place]) 04/26/2011  . Osteoporosis   . Palpitations 01/19/2016   a. event monitor in 01/2016 showed PVC's with one episode of ventricular quadrigeminy.  [name] PVC (premature ventricular contraction) 02/24/2016  . Shortness of breath     PAST SURGICAL HISTORY: Past Surgical History:  Procedure Laterality Date  . BIOPSY THYROID  2016  . COLONOSCOPY W/ BIOPSIES    . EYE SURGERY    . TONSILLECTOMY     . TOTAL ABDOMINAL HYSTERECTOMY    Total hysterectomy at age 74.  FAMILY HISTORY Family History  Problem Relation Age of Onset  . Emphysema Maternal Uncle   . Heart disease Mother   . Heart disease Maternal Grandfather   . Rheum arthritis Sister   . Cancer Sister   . Cancer Sister        throat cancer  . Brain cancer Sister   . Cancer Sister   . Ovarian cancer Sister   . Breast cancer Daughter   Pt father died in his mid 23's from stroke and DM. Pt mother died at age 57. She has 2 brothers and 4 sisters. Pt sister had ovarian cancer that was diagnosed in her 56's at Stage IV. Her sister died at age 47. There is no family hx of breast cancer.    GYNECOLOGIC HISTORY:  Menarche: 83 years old Age at first live birth: 83 years old GP: GXP2 LMP: Approximately at age 36, when patient underwent hysterectomy with bilateral salpingo-oophorectomy  Contraceptive:  remote OCP.  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 She is widowed. At home her daughter [name], a retired Pharmacist, hospital currently on disability, lives with her. Son [name] lives in [place] and works as a Art gallery manager. The patient has 2 grandsons. She is a Protestant.  ADVANCED DIRECTIVES: Yes   HEALTH MAINTENANCE: Social History  Substance Use Topics  . Smoking status: Former Smoker    Packs/day: 0.50    Years: 40.00    Types: Cigarettes    Quit date: 06/07/1998  . Smokeless tobacco: Never Used  . Alcohol use No     Colonoscopy: Age 45    PAP: in her 57's   Bone density: 2017    Allergies  Allergen Reactions  . Tiotropium Bromide Monohydrate Hives, Shortness Of Breath and Rash    Spiriva   . Codeine Other (See Comments)    Hyperactivity,crying  . Fosamax [Alendronate] Other (See Comments)    Muscle aches  . Asa [Aspirin] Other (See Comments)    Bleeding -ulcers  . Latex Rash    Current Outpatient Prescriptions  Medication Sig Dispense Refill  . albuterol (PROVENTIL HFA;VENTOLIN HFA) 108  (90 Base) MCG/ACT inhaler Inhale 2 puffs into the lungs every 6 (six) hours as needed for wheezing or shortness of breath.    [name] BREO ELLIPTA 100-25 MCG/INH AEPB INHALE ONE PUFF INTO THE LUNGS ONCE DAILY 60 each 11  . cetirizine (ZYRTEC) 10 MG tablet Take 10 mg by mouth daily.      . Cholecalciferol (VITAMIN D) 2000 UNITS CAPS Take 2,000 Units by mouth 2 (two) times daily.     . fluticasone (FLONASE) 50 MCG/ACT nasal spray Place 2 sprays into the nose daily as needed for allergies or rhinitis.     . INCRUSE ELLIPTA 62.5 MCG/INH AEPB INHALE 1 PUFF BY MOUTH ONCE DAILY 30 each 5  . metoprolol tartrate (LOPRESSOR) 25 MG tablet Take 0.5 tablets (12.5 mg total) by mouth 2 (two) times daily. 90 tablet 1  . Omega-3 Fatty Acids (FISH OIL) 1000 MG CAPS Take 1,000 mg by mouth at bedtime.     [name] SINGULAIR 10 MG tablet Take 10 mg by mouth at bedtime.      No current facility-administered medications for this visit.     OBJECTIVE: Elderly white woman with nasal cannula  and portable oxygen in place   Vitals:   03/16/17 0847  BP: (!) 136/56  Pulse: 61  Resp: 19  Temp: 98.2 F (36.8 C)  SpO2: 100%     Body mass index is 24.85 kg/m.   Wt Readings from Last 3 Encounters:  03/16/17 118 lb 14.4 oz (53.9 kg)  11/23/16 117 lb 3.2 oz (53.2 kg)  11/10/16 116 lb 12.8 oz (53 kg)      ECOG FS:2 - Symptomatic, <50% confined to bed  Ocular: Sclerae unicteric, pupils round and equal Ear-nose-throat: Oropharynx clear and moist; hearing moderately severely impaired Lymphatic: No cervical or supraclavicular adenopathy Lungs no rales or rhonchi Heart regular rate and rhythm Abd soft, nontender, positive bowel sounds MSK no focal spinal tenderness, no joint edema Neuro: non-focal, well-oriented, appropriate affect Breasts: The right breast is status post recent biopsy. There is a large ecchymosis. The left breast is unremarkable. Both axillae are benign.   LAB RESULTS:  CMP     Component Value Date/Time    NA 143 03/16/2017 0823   K 4.6 03/16/2017 0823   CL 105 10/31/2016 1555   CO2 35 (H) 03/16/2017 0823   GLUCOSE 91 03/16/2017 0823   BUN 9.9 03/16/2017 0823   CREATININE 0.7 03/16/2017 0823   CALCIUM 10.0 03/16/2017 0823   PROT 6.9 03/16/2017 0823   ALBUMIN 3.9 03/16/2017 0823   AST 19 03/16/2017 0823   ALT 19 03/16/2017 0823   ALKPHOS 53 03/16/2017 0823   BILITOT 0.55 03/16/2017 0823   GFRNONAA >60 10/31/2016 1555   GFRAA >60 10/31/2016 1555    No results found for: TOTALPROTELP, ALBUMINELP, A1GS, A2GS, BETS, BETA2SER, GAMS, MSPIKE, SPEI  No results found for: Nils Pyle, [facility]  Lab Results  Component Value Date   WBC 5.2 03/16/2017   NEUTROABS 3.4 03/16/2017   HGB 12.8 03/16/2017   HCT 39.9 03/16/2017   MCV 87.6 03/16/2017   PLT 182 03/16/2017    _0 @  No results found for: LABCA2  No components found for: YQMVHQ469  No results for input(s): INR in the last 168 hours.  No results found for: LABCA2  No results found for: GEX528  No results found for: UXL244  No results found for: WNU272  No results found for: CA2729  No components found for: HGQUANT  No results found for: CEA1 / No results found for: CEA1   No results found for: AFPTUMOR  No results found for: [place]  No results found for: PSA1  Appointment on 03/16/2017  Component Date Value Ref Range Status  . WBC 03/16/2017 5.2  3.9 - 10.3 10e3/uL Final  . NEUT# 03/16/2017 3.4  1.5 - 6.5 10e3/uL Final  . HGB 03/16/2017 12.8  11.6 - 15.9 g/dL Final  . HCT 03/16/2017 39.9  34.8 - 46.6 % Final  . Platelets 03/16/2017 182  145 - 400 10e3/uL Final  . MCV 03/16/2017 87.6  79.5 - 101.0 fL Final  . MCH 03/16/2017 28.2  25.1 - 34.0 pg Final  . MCHC 03/16/2017 32.2  31.5 - 36.0 g/dL Final  . RBC 03/16/2017 4.55  3.70 - 5.45 10e6/uL Final  . RDW 03/16/2017 14.5  11.2 - 14.5 % Final  . lymph# 03/16/2017 1.1  0.9 - 3.3 10e3/uL Final  . MONO# 03/16/2017 0.6  0.1 - 0.9  10e3/uL Final  . Eosinophils Absolute 03/16/2017 0.1  0.0 - 0.5 10e3/uL Final  . Basophils Absolute 03/16/2017 0.0  0.0 - 0.1 10e3/uL Final  . NEUT% 03/16/2017 65.2  38.4 - 76.8 % Final  . LYMPH% 03/16/2017 20.7  14.0 - 49.7 % Final  . MONO% 03/16/2017 11.4  0.0 - 14.0 % Final  . EOS% 03/16/2017 2.4  0.0 - 7.0 % Final  . BASO% 03/16/2017 0.3  0.0 - 2.0 % Final  . Sodium 03/16/2017 143  136 - 145 mEq/L Final  . Potassium 03/16/2017 4.6  3.5 - 5.1 mEq/L Final  . Chloride 03/16/2017 101  98 - 109 mEq/L Final  . CO2 03/16/2017 35* 22 - 29 mEq/L Final  . Glucose 03/16/2017 91  70 - 140 mg/dl Final   Glucose reference range is for nonfasting patients. Fasting glucose reference range is 70- 100.  [name] BUN 03/16/2017 9.9  7.0 - 26.0 mg/dL Final  . Creatinine 03/16/2017 0.7  0.6 - 1.1 mg/dL Final  . Total Bilirubin 03/16/2017 0.55  0.20 - 1.20 mg/dL Final  . Alkaline Phosphatase 03/16/2017 53  40 - 150 U/L Final  . AST 03/16/2017 19  5 - 34 U/L Final  . ALT 03/16/2017 19  0 - 55 U/L Final  . Total Protein 03/16/2017 6.9  6.4 - 8.3 g/dL Final  . Albumin 03/16/2017 3.9  3.5 - 5.0 g/dL Final  . Calcium 03/16/2017 10.0  8.4 - 10.4 mg/dL Final  . Anion Gap 03/16/2017 8  3 - 11 mEq/L Final  . EGFR 03/16/2017 >60  >60 ml/min/1.73 m2 Final   eGFR is calculated using the CKD-EPI Creatinine Equation (2009)    (this displays the last labs from the last 3 days)  No results found for: TOTALPROTELP, ALBUMINELP, A1GS, A2GS, BETS, BETA2SER, GAMS, MSPIKE, SPEI (this displays SPEP labs)  No results found for: KPAFRELGTCHN, LAMBDASER, KAPLAMBRATIO (kappa/lambda light chains)  No results found for: HGBA, HGBA2QUANT, HGBFQUANT, HGBSQUAN (Hemoglobinopathy evaluation)   No results found for: LDH  No results found for: IRON, TIBC, IRONPCTSAT (Iron and TIBC)  No results found for: FERRITIN  Urinalysis    Component Value Date/Time   COLORURINE YELLOW 06/11/2015 1308   APPEARANCEUR CLOUDY (A) 06/11/2015  1308   LABSPEC 1.018 06/11/2015 1308   PHURINE 6.0 06/11/2015 1308   GLUCOSEU >1000 (A) 06/11/2015 1308   HGBUR TRACE (A) 06/11/2015 1308   BILIRUBINUR NEGATIVE 06/11/2015 1308   [place] 06/11/2015 [place] 06/11/2015 1308   NITRITE NEGATIVE 06/11/2015 1308   LEUKOCYTESUR SMALL (A) 06/11/2015 1308     STUDIES: Radiology discussed with patient  ELIGIBLE FOR AVAILABLE RESEARCH PROTOCOL: COMET  ASSESSMENT: 83 y.o. [place] woman Status post right breast upper outer quadrant biopsy 03/07/2017, for ductal carcinoma in situ, high-grade, weakly estrogen receptor positive, progesterone receptor negative  (1) breast conserving surgery under block/local anesthesia, with no sentinel lymph node sampling planned  (2) I am comfortable foregoing adjuvant radiation given pulmonary history and concerns  (3) no anti-estrogens planned given the low likelihood of benefit  PLAN: We spent the better part of today's hour-long appointment discussing the biology of her diagnosis and the specifics of her situation. Amina understands that in noninvasive ductal carcinoma, also called ductal carcinoma in situ ("DCIS") the breast cancer cells remain trapped in the ducts were they started. They cannot travel to a vital organ. For that reason these cancers in themselves are not life-threatening.  If the whole breast is removed then all the ducts are removed and since the cancer cells are trapped in the ducts, the cure rate with mastectomy for noninvasive breast cancer is approximately 99%. Nevertheless we recommend lumpectomy, because there  is no survival advantage to mastectomy and because the cosmetic result is generally superior with breast conservation.  Since the patient is keeping her breast, there will be some risk of recurrence. The recurrence can only be in the same breast since, again, the cells are trapped in the ducts. There is no connection from one breast to the other.  The risk of local recurrence is cut by more than half with radiation, which is standard in this situation.  However in Jadie's case, given the prior radiation to the lungs for her cancer and her severe COPD, I would not be uncomfortable with surgery alone in terms of local treatment.  As far as systemic therapy is concerned, we do not do chemotherapy for noninvasive tumors. She is essentially estrogen and progesterone receptor negative and is unlikely to derive much if any benefit from anti-estrogens.  Accordingly the overall plan is for surgery alone. This will be done essentially under local, with a nerve block, and with the patient sitting as she is unable to lie down because of her COPD.  Anija does qualify for genetics testing. In patients who carry a deleterious mutation [for example in a  BRCA gene], the risk of a new breast cancer developing in the future may be sufficiently great that the patient may choose bilateral mastectomies. This is not the case in Chenay's situation, because of her comorbidities. If she did carry a deleterious mutation, we would consider intensified screening, which generally means not only yearly mammography but a yearly breast MRI as well. Note that she is already status post bilateral salpingo-oophorectomy.   Ivet has a good understanding of the overall plan. She agrees with it. She knows the goal of treatment in her case is cure. She will call with any problems that may develop before her next visit here.  Chauncey Cruel, MD 03/16/17 11:39 AM Medical Oncology and Hematology [facility] [address] [place], [place] [address] Tel. [phone] Fax. [phone]    This document serves as a record of services personally performed by Lurline Del, MD. It was created on her behalf by Steva Colder, a trained medical scribe. The creation of this record is based on the scribe's personal observations and the provider's statements to  them. This document has been checked and approved by the attending provider.

## 2017-03-15 ENCOUNTER — Other Ambulatory Visit: Payer: Self-pay | Admitting: *Deleted

## 2017-03-16 ENCOUNTER — Ambulatory Visit: Payer: Medicare Other | Admitting: Physical Therapy

## 2017-03-16 ENCOUNTER — Encounter: Payer: Self-pay | Admitting: *Deleted

## 2017-03-16 ENCOUNTER — Encounter: Payer: Self-pay | Admitting: Oncology

## 2017-03-16 ENCOUNTER — Telehealth: Payer: Self-pay | Admitting: *Deleted

## 2017-03-16 ENCOUNTER — Ambulatory Visit
Admission: RE | Admit: 2017-03-16 | Discharge: 2017-03-16 | Disposition: A | Discharge status: Home / Self Care | Payer: Medicare Other | Source: Ambulatory Visit | Attending: Radiation Oncology | Admitting: Radiation Oncology

## 2017-03-16 ENCOUNTER — Other Ambulatory Visit (HOSPITAL_BASED_OUTPATIENT_CLINIC_OR_DEPARTMENT_OTHER): Payer: Medicare Other

## 2017-03-16 ENCOUNTER — Ambulatory Visit (HOSPITAL_BASED_OUTPATIENT_CLINIC_OR_DEPARTMENT_OTHER): Payer: Medicare Other | Admitting: Oncology

## 2017-03-16 VITALS — BP 136/56 | HR 61 | Temp 98.2°F | Resp 19 | Ht <= 58 in | Wt 118.9 lb

## 2017-03-16 DIAGNOSIS — Z85118 Personal history of other malignant neoplasm of bronchus and lung: Secondary | ICD-10-CM

## 2017-03-16 DIAGNOSIS — Z170 Estrogen receptor positive status [ER+]: Secondary | ICD-10-CM

## 2017-03-16 DIAGNOSIS — J449 Chronic obstructive pulmonary disease, unspecified: Secondary | ICD-10-CM

## 2017-03-16 DIAGNOSIS — Z803 Family history of malignant neoplasm of breast: Secondary | ICD-10-CM | POA: Diagnosis not present

## 2017-03-16 DIAGNOSIS — Z923 Personal history of irradiation: Secondary | ICD-10-CM | POA: Diagnosis not present

## 2017-03-16 DIAGNOSIS — D0511 Intraductal carcinoma in situ of right breast: Secondary | ICD-10-CM

## 2017-03-16 DIAGNOSIS — N641 Fat necrosis of breast: Secondary | ICD-10-CM | POA: Diagnosis not present

## 2017-03-16 DIAGNOSIS — C342 Malignant neoplasm of middle lobe, bronchus or lung: Secondary | ICD-10-CM

## 2017-03-16 DIAGNOSIS — J9620 Acute and chronic respiratory failure, unspecified whether with hypoxia or hypercapnia: Secondary | ICD-10-CM

## 2017-03-16 DIAGNOSIS — Z9981 Dependence on supplemental oxygen: Secondary | ICD-10-CM | POA: Diagnosis not present

## 2017-03-16 DIAGNOSIS — J9611 Chronic respiratory failure with hypoxia: Secondary | ICD-10-CM

## 2017-03-16 LAB — CBC WITH DIFFERENTIAL/PLATELET
BASO%: 0.3 % (ref 0.0–2.0)
BASOS ABS: 0 10*3/uL (ref 0.0–0.1)
EOS%: 2.4 % (ref 0.0–7.0)
Eosinophils Absolute: 0.1 10*3/uL (ref 0.0–0.5)
HCT: 39.9 % (ref 34.8–46.6)
HEMOGLOBIN: 12.8 g/dL (ref 11.6–15.9)
LYMPH%: 20.7 % (ref 14.0–49.7)
MCH: 28.2 pg (ref 25.1–34.0)
MCHC: 32.2 g/dL (ref 31.5–36.0)
MCV: 87.6 fL (ref 79.5–101.0)
MONO#: 0.6 10*3/uL (ref 0.1–0.9)
MONO%: 11.4 % (ref 0.0–14.0)
NEUT#: 3.4 10*3/uL (ref 1.5–6.5)
NEUT%: 65.2 % (ref 38.4–76.8)
Platelets: 182 10*3/uL (ref 145–400)
RBC: 4.55 10*6/uL (ref 3.70–5.45)
RDW: 14.5 % (ref 11.2–14.5)
WBC: 5.2 10*3/uL (ref 3.9–10.3)
lymph#: 1.1 10*3/uL (ref 0.9–3.3)

## 2017-03-16 LAB — COMPREHENSIVE METABOLIC PANEL
ALT: 19 U/L (ref 0–55)
AST: 19 U/L (ref 5–34)
Albumin: 3.9 g/dL (ref 3.5–5.0)
Alkaline Phosphatase: 53 U/L (ref 40–150)
Anion Gap: 8 mEq/L (ref 3–11)
BUN: 9.9 mg/dL (ref 7.0–26.0)
CHLORIDE: 101 meq/L (ref 98–109)
CO2: 35 mEq/L — ABNORMAL HIGH (ref 22–29)
Calcium: 10 mg/dL (ref 8.4–10.4)
Creatinine: 0.7 mg/dL (ref 0.6–1.1)
GLUCOSE: 91 mg/dL (ref 70–140)
POTASSIUM: 4.6 meq/L (ref 3.5–5.1)
Sodium: 143 mEq/L (ref 136–145)
Total Bilirubin: 0.55 mg/dL (ref 0.20–1.20)
Total Protein: 6.9 g/dL (ref 6.4–8.3)

## 2017-03-16 NOTE — Progress Notes (Signed)
Nutrition Assessment  Reason for Assessment:  Pt seen in Breast Clinic  ASSESSMENT:   83 year old female with new diagnosis of breast cancer.  Past medical history of COPD on home O2.  Patient reports that she prepares all her meals and enjoys cooking and trying new recipes.    Medications:  reviewed  Labs: reviewed  Anthropometrics:   Height: 4\' 10"  Weight: 118 lb BMI: 28.8  Reports stable weight  NUTRITION DIAGNOSIS: Food and nutrition related knowledge deficit related to new diagnosis of breast cancer as evidenced by no prior need for nutrition related information.  INTERVENTION:   Discussed and provided packet of information regarding nutritional tips for breast cancer patients.   Patient with questions regarding calcium rich foods and list of foods provided. Also with questions regarding nutrition therapy to lower cholesterol.  Provided information per patient request.  Questions answered.  Teachback method used.  Contact information provided and patient knows to contact me with questions/concerns.    MONITORING, EVALUATION, and GOAL: Pt will consume a healthy plant based diet to maintain lean body mass throughout treatment.   Marlane Hirschmann B. Zenia Resides, [place], [place] Registered Dietitian [phone] (pager)

## 2017-03-16 NOTE — Progress Notes (Signed)
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 female who is here to discuss management of her UOQ right breast cancer. Initially, she had screening mammography on  02/11/17 which showed grouped pleomorphic calcifications within the right breast which were indeterminate. US of the right axilla was negative for malignancy. She underwent needle core biopsy on 03/07/17 which showed DCIS with calcifications and necrosis. Histological analysis revealed this to be ER(+) weak staining and PR(-).   Of note, her daughter was diagnosed with breast cancer at the age of 81yo. She is also currently on continuous home oxygen at 3L.   PREVIOUS RADIATION THERAPY: Yes, prior right middle lung cancer with radiotherapy under Dr Pablo Ledger. SBRT to a total dose of 60 gray completed in 5 fractions directed to the right middle lobe.   PAST MEDICAL HISTORY:  has a past medical history of Allergy; Asthma; Cataract; Colon adenomas; COPD (chronic obstructive pulmonary disease) ([place]); Coronary artery calcification (09/01/2016); Goiter; History of radiation therapy (05/24/11-06/02/11); colonic polyps; Hyperlipidemia; Lung cancer, middle lobe ([place]) (04/26/2011); Osteoporosis; Palpitations (01/19/2016); PVC (premature ventricular contraction) (02/24/2016); and Shortness of breath.    PAST SURGICAL HISTORY: Past Surgical History:  Procedure Laterality Date  . BIOPSY THYROID  2016  . COLONOSCOPY W/ BIOPSIES    . EYE SURGERY    . TONSILLECTOMY    . TOTAL ABDOMINAL HYSTERECTOMY      FAMILY HISTORY: family history includes Brain cancer in her sister; Breast cancer in  her daughter; Cancer in her sister, sister, and sister; Emphysema in her maternal uncle; Heart disease in her maternal grandfather and mother; Ovarian cancer in her sister; Rheum arthritis in her sister.  SOCIAL HISTORY:  reports that she quit smoking about 18 years ago. Her smoking use included Cigarettes. She has a 20.00 pack-year smoking history. She has never used smokeless tobacco. She reports that she does not drink alcohol or use drugs.  ALLERGIES: Tiotropium bromide monohydrate; Codeine; Fosamax [alendronate]; Asa [aspirin]; and Latex  MEDICATIONS:  Current Outpatient Prescriptions  Medication Sig Dispense Refill  . albuterol (PROVENTIL HFA;VENTOLIN HFA) 108 (90 Base) MCG/ACT inhaler Inhale 2 puffs into the lungs every 6 (six) hours as needed for wheezing or shortness of breath.    [name] BREO ELLIPTA 100-25 MCG/INH AEPB INHALE ONE PUFF INTO THE LUNGS ONCE DAILY 60 each 11  . cetirizine (ZYRTEC) 10 MG tablet Take 10 mg by mouth daily.      . Cholecalciferol (VITAMIN D) 2000 UNITS CAPS Take 2,000 Units by mouth 2 (two) times daily.     . fluticasone (FLONASE) 50 MCG/ACT nasal spray Place 2 sprays into the nose daily as needed for allergies or rhinitis.     . INCRUSE ELLIPTA 62.5 MCG/INH AEPB INHALE 1 PUFF BY MOUTH ONCE DAILY 30 each 5  . metoprolol tartrate (LOPRESSOR) 25 MG tablet Take 0.5 tablets (12.5 mg total) by mouth 2 (two) times daily. 90 tablet 1  . Omega-3 Fatty Acids (FISH OIL) 1000 MG CAPS Take 1,000 mg by mouth at bedtime.     [name] SINGULAIR 10 MG tablet Take 10 mg by mouth at bedtime.      No current facility-administered medications  for this encounter.     REVIEW OF SYSTEMS:  REVIEW OF SYSTEMS: A 10+ POINT REVIEW OF SYSTEMS WAS OBTAINED including neurology, dermatology, psychiatry, cardiac, respiratory, lymph, extremities, GI, GU, musculoskeletal, constitutional, reproductive, HEENT. All pertinent positives are noted in the HPI. All others are negative. She denies any breast pain,  nipple discharge or bleeding prior to diagnosis.    PHYSICAL EXAM:  Vitals - 1 value per visit 54/56/2563  SYSTOLIC 893  DIASTOLIC 56  Pulse 61  Temperature 98.2  Respirations 19  Weight (lb) 118.9  Height 4\' 10"   BMI 24.85   P tis resting comfortable with 2L O2 in place. Due to back and breathing problems the pt was unable to lie flat on the examination table. Lungs clear to auscultation bilaterally. Heart has regular rate and rhythm. No palpable cervical, supraclavicular, or axillary adenopathy. Abdomen soft, non-tender, normal bowel sounds. Left breast with no palpable mass or nipple discharge. Right breast no palpable mass or nipple discharge  ECOG = 2  LABORATORY DATA:  Lab Results  Component Value Date   WBC 5.2 03/16/2017   HGB 12.8 03/16/2017   HCT 39.9 03/16/2017   MCV 87.6 03/16/2017   PLT 182 03/16/2017   NEUTROABS 3.4 03/16/2017   Lab Results  Component Value Date   NA 143 03/16/2017   K 4.6 03/16/2017   CL 105 10/31/2016   CO2 35 (H) 03/16/2017   GLUCOSE 91 03/16/2017   CREATININE 0.7 03/16/2017   CALCIUM 10.0 03/16/2017    RADIOGRAPHY: No results found.    IMPRESSION: [name] is a 83 y.o. female who is here to discuss the role of radiotherapy in the ongoing management of her breast cancer. High grade DCIS of the right breast. Pt's tumor was high grade and weakly positive for estrogen. The pt may not get significant benefit from adjuvant hormonal therapy. Dr Heather Barnes does not recommend hormonal therapy as well given her overall performance status. Normally with a high grade lesion and lack of hormonal therapy I would consider adjuvant radiation as part of her management. Pt however has severely limited respiratory reserve and has a prior h/o radiation therapy in the right middle lobe area with some overlap of the right breast. Therefore I would not recommend radiation therapy in her situation given the potential worsening of her respiration condition  and given that she cannot lie flat for her daily radiation treatment. She understands the issues well and agrees with this recommendation. Her family is also in agreement.  PLAN: patient will proceed with definitive surgery under the direction of Dr. Donne Hazel.  Close follow-up afterward.    ------------------------------------------------  Blair Promise, PhD, MD  This document serves as a record of services personally performed by Gery Pray, MD. It was created on his behalf by Reola Mosher, a trained medical scribe. The creation of this record is based on the scribe's personal observations and the provider's statements to them. This document has been checked and approved by the attending provider.

## 2017-03-16 NOTE — Progress Notes (Signed)
Clinical Social Work [place] Psychosocial Distress Screening [place]  Patient completed distress screening protocol and scored a 5 on the Psychosocial Distress Thermometer which indicates moderate distress. Clinical Social Worker met with patient and patients family in [facility] to assess for distress and other psychosocial needs. Patient stated she was feeling overwhelmed but felt "better" after meeting with the treatment team and getting more information on her treatment plan. CSW and patient discussed common feeling and emotions when being diagnosed with cancer, and the importance of support during treatment. CSW informed patient of the support team and support services at [facility]. CSW provided contact information and encouraged patient to call with any questions or concerns.  ONCBCN DISTRESS SCREENING 03/16/2017  Screening Type Initial Screening  Distress experienced in past week (1-10) 5  Practical problem type Transportation;Food  Emotional problem type Adjusting to illness;Feeling hopeless  Information Concerns Type Lack of info about treatment;Lack of info about complementary therapy choices  Physical Problem type Sleep/insomnia;Breathing;Constipation/diarrhea     Johnnye Lana, MSW, LCSW, OSW-C Clinical Social Worker [place] [phone]

## 2017-03-16 NOTE — Telephone Encounter (Signed)
Pt called to relate she would like to cancel appt for genetics on 10/11 and will r/s at a later date.

## 2017-03-23 ENCOUNTER — Telehealth: Payer: Self-pay | Admitting: Internal Medicine

## 2017-03-23 NOTE — Telephone Encounter (Signed)
Have received the fax regarding pt's surgical clearance. Will discuss this with MR Friday when he is in the office.

## 2017-03-23 NOTE — Telephone Encounter (Signed)
MR will be back in the office on Friday 10/19.  Will forward to [facility] and MR to make them aware that this form needs to be addressed.

## 2017-03-24 ENCOUNTER — Telehealth: Payer: Self-pay | Admitting: Oncology

## 2017-03-24 NOTE — Telephone Encounter (Signed)
Spoke with patient regarding appt per 10/10 los.

## 2017-03-24 NOTE — Telephone Encounter (Signed)
Called pt stating to her that we received the urgent request for pt to have clearance at our office before she could be scheduled for surgery of her rt breast.  Tried to see if pt could come in either today or tomorrow with one of our providers for an appt but due to transportation issues, an appt was scheudled with MR Tuesday, 03/29/17 at 10:00 with MR.  Nothing further needed at this time.

## 2017-03-25 ENCOUNTER — Other Ambulatory Visit: Payer: Self-pay | Admitting: General Surgery

## 2017-03-25 DIAGNOSIS — D0512 Intraductal carcinoma in situ of left breast: Secondary | ICD-10-CM

## 2017-03-25 NOTE — Telephone Encounter (Signed)
I sent a follow up email to the one you responded too.  She won't be sitting straight up but can be at least at45-60 degrees.   Also there is always chance she would need some mild sedation but would try not to.  What is her life expectancy from her copd.

## 2017-03-25 NOTE — Telephone Encounter (Addendum)
I have already told Dr Donne Hazel (surgeon( and Dr Jana Hakim (oncologsiust) that is without any kind of IV sedation of anestheisa and with patient sitting and just local anesthesia and is 30 minutes as they said is fine for her to have procedure. She will still need end tidal co2 monitoring and pulse ox and vital sign monitoring and a clear understanding of code status if she were to decompensate during procedure  I am copying Dr Donne Hazel as well  But I will sign the  Sheet 03/25/2017   Dr. Brand Males, M.D., [facility].C.P Pulmonary and Critical Care Medicine Staff Physician [place] [facility] Pager: [phone], If no answer or between  15:00h - 7:00h: call [phone]  03/25/2017 6:11 AM

## 2017-03-25 NOTE — Telephone Encounter (Signed)
MR spoke with the surgeon about pt. I asked him if she needed to come in for an ov for the surgical clearance and he stated that she did not need to. Cancelled the appt that I had made with him next Tuesday, 03/29/17 and called pt to let her know that I was canceling this appt. Pt expressed understanding. Nothing further needed.

## 2017-03-25 NOTE — Telephone Encounter (Addendum)
Hi Matt   Position - I think 45 or 60 degree is fine  COPD prognosis - the hardest thing to do. In 2012 she was gold stage 3 - 41% and nearly gold stage 4 - 36% in 2013. She is on several liters o2 and effectively stage 4 copd esp after XRT to lung. These patients have a 25% - 4 year survival (75% mortality) and she has beaten it through good cheer and will and keeping fit and managin her flare ups well. So overall I would say her prognosis from where she is today is few to several years (Taking age and copd status into account). Any flu or flare up that hospitalizes her can kill her any time. I hope this helps  I am avail on my cell to discuss; [phone]  THanks  Dr. Brand Males, M.D., [facility].C.P Pulmonary and Critical Care Medicine Staff Physician [place] [facility] Pager: [phone], If no answer or between  15:00h - 7:00h: call [phone]  03/25/2017 9:06 AM

## 2017-03-28 ENCOUNTER — Telehealth (HOSPITAL_COMMUNITY): Payer: Self-pay | Admitting: Vascular Surgery

## 2017-03-28 NOTE — Progress Notes (Signed)
Anesthesia Chart Review: Patient is an 83 year old female with recent diagnosis of right breast ductal carcinoma in situ, high grade, weakly estrogen receptor positive at 5%, progesterone receptor negative. Right axillary ultrasonography did not no any suspicious axillary lymph nodes. She has severe Gold stage IV COPD. Preliminary plan at multidisciplinary breast cancer conference was for patient to undergo breast conserving surgery, no sentinel lymph node sampling, possible radiation treatment, possible anti-estrogens, and genetic referral. As of 03/16/17, radiation therapy not planned due to pulmonary status and prior radiation to right chest.  Due to her pulmonary status Dr. Donne Barnes discussed her pulmonary risk with patient's pulmonologist Dr. Chase Barnes. He felt procedure (right breast lumpectomy) should be done with local anesthesia and with patient sitting up. She would need end tidal CO2 monitoring, pulse oximetry, vital sign monitoring, and a clear understanding of code status if she were to decompensate during the procedure. Dr. Donne Barnes responded that patient could be at 45-60 degrees and would try not to use sedation. He also questioned about patient's life expectancy in regards to her COPD. Dr. Chase Barnes felt overall her prognosis was a few to several years taking age and COPD status into account; however, any flu or flare that hospitalizes her has the potential to be life threatening. (See 03/25/17 telephone encounters.) Dr. Donne Barnes would not scheduled right breast lumpectomy until patient had an anesthesia consult--this is tentatively scheduled for 04/01/17 at 9:30 AM.   History includes former smoker, CAD (coronary calcifications on chest CT), Gold stage IV COPD with chronic hypoxic respiratory failure (on home O2, 2-3L/[place]), asthma, pneumonia (hospitalized 06/2015 and 03/2016, requiring BiPAP), goiter, HLD, RML lung cancer ([facility]CC) s/p radiation 05/2011 (12/22/16 CT showed no evidence of RML local  recurrence but new LUL nodule with 6 month f/u planned), palpitations/PVCs, dyspnea, right breast DCIS, thoracic compression fractures (chonic T9, acute/subacute significant T11 05/2016 MRI).   - PCP is Dr. Theadore Barnes. - Pulmonologist is Dr. Brand Barnes, last visit 11/10/16. Six month follow-up recommended. - Cardiologist is Dr. Skeet Barnes. Last visit 11/23/16 with Heather Pho, PA-C for follow-up ED visit for dizziness and palpitations with multiple PVCs and one episode of ventricular quadrigeminy. She was started on low dose Lopressor with "significant improvement in her palpitations." (Could consider switching to Bisoprolol if patient experienced any acute respiratory status changes.) She remained asymptomatic of CAD. Patient is intolerant to ASA and statins. Six month follow-up recommended. - HEM-ONC is Dr. Lurline Barnes. Last visit 03/16/17. - RAD-ONC is Dr. Gery Barnes. According to his 03/16/17 note, he would not recommend radiation therapy given her severely limited respiratory reserve and prior RML radiation.   Med list will need to be updated. Currently, albuterol HFA 2 puffs Q 6 hours PRN, Breo Ellipta 1 puff daily, Zyrtec, Vitamin D, Flonase nasal spray 2 sprays daily PRN, Incruse Ellipta 1 puff daily, Lopressor 25 mg 1/2 tab BID, Fish oil, Singulair Q HS are listed.  EKG 11/23/16 ([facility]): NSR.  Event Monitor (7 day) 01/21/16/01/27/16:  Quality: Fair. Baseline artifact. Predominant rhythm: sinus Average heart rate: 77 bpm Max heart rate: 110 bpm Min heart rate: 64 bpm Occasional PVCs, with an episode of ventricular quadrigeminy.  Chest CT 12/22/16: IMPRESSION: 1. No evidence of local tumor recurrence in the right middle lobe . 2. Solitary new 4 mm medial left upper lobe solid pulmonary nodule. Recommend initial follow-up chest CT in 6 months. 3. No adenopathy or other potential findings of metastatic disease in the chest . 4. Left main and 3 vessel  coronary atherosclerosis.  CXR 10/31/16: IMPRESSION: COPD changes with bibasilar scarring. Chronic lower thoracic spine compression fractures. No acute abnormalities.  Spirometry 11/04/11: FVC 1.35 (60%), FEV1 0.61 (36%), FEV1/FVC 45% (58%), FEF 25-75% 0.17 (11%). Severe airway obstruction with low vital capacity.  Labs on 03/16/17 showed glucose 91, CO2 35, Cr 0.70, AST/ALT 19/19. H/H 12.8/39.9. PLT 182.   I have notified anesthesiologist Dr. Roberts Barnes of request for anesthesia consult tentatively scheduled for 04/01/17 at 9:30 AM. Dr. Donne Barnes and/or his staff will be updated following her evaluation.  George Hugh [facility] Short Stay Center/Anesthesiology Phone [phone] 03/28/2017 10:36 PM

## 2017-03-29 ENCOUNTER — Ambulatory Visit: Payer: Medicare Other | Admitting: Internal Medicine

## 2017-04-01 ENCOUNTER — Telehealth (HOSPITAL_COMMUNITY): Payer: Self-pay | Admitting: Vascular Surgery

## 2017-04-01 ENCOUNTER — Other Ambulatory Visit: Payer: Self-pay | Admitting: General Surgery

## 2017-04-01 DIAGNOSIS — D0511 Intraductal carcinoma in situ of right breast: Secondary | ICD-10-CM | POA: Diagnosis not present

## 2017-04-01 NOTE — Progress Notes (Signed)
Anesthesia Follow-up: See my Progress Note from 03/28/17. Patient came into PAT this morning with her daughter for an anesthesia consult due to Gold stage IV COPD on continuous home O2 (currently 3L/[place]). Dr. Donne Hazel did not want to post case until anesthesiologist could evaluate and agree to proceed with right breast lumpectomy under local anesthesia (none-to minimal sedation) and at 45-60 degree HOB elevation. (This was outlined in communication with pulmonology.) Patient was evaluated by anesthesiologist Dr. Ethelene Hal and myself. If there are no acute changes in her status then it is anticipated that patient can proceed.   Today she presents with her daughter. She is in a hospital wheelchair. Wearing 3L/[place]. She is well kept. Her voice quality is somewhat diminished, but able to converse for several minutes with minimal dyspnea. Her activity is limited and she is dependent on her oxygen (sats 73% when oxygen tank malfunctioned when walking out to her car last week), but she tries to stay as active as she can. She is able to help with dishes, but cannot do much housework. She likes to cook and can be in the kitchen for ~ 2 hours before she needs to rest and stretch. She reports her PVCs (bigemny) have been controlled on low dose b-blocker. She denied syncope. No edema. She sleeps on her side. She denied any recent respiratory illness, but does have allergic rhinitis (tends to have clear rhinorrhea).    Heart RRR, no murmur noted. Lungs sounds are diminished with few faint crackles left base that improved with deep breaths. No ankle edema.   Patient had normal labs (other than CO2 35) on 03/16/17. We did not repeat any labs today because we still are not sure when her case will be posted. Patient believes she already signed a surgical consent at [place]. I have notified Stephanie at Dr. Cristal Generous office that it is okay to scheduled right breast lumpectomy. She can schedule patient's PAT visit, unless Dr.  Donne Hazel wants her to just get repeat labs on the day of her surgery. Patient knows that she should be at her baseline to proceed with surgery, so to let her surgeon know if any new changes.  George Hugh [facility] Short Stay Center/Anesthesiology Phone [phone] 04/01/2017 4:19 PM

## 2017-04-05 ENCOUNTER — Other Ambulatory Visit: Payer: Self-pay | Admitting: General Surgery

## 2017-04-05 DIAGNOSIS — D0512 Intraductal carcinoma in situ of left breast: Secondary | ICD-10-CM

## 2017-04-08 ENCOUNTER — Encounter (HOSPITAL_COMMUNITY): Payer: Self-pay | Admitting: *Deleted

## 2017-04-08 DIAGNOSIS — C50411 Malignant neoplasm of upper-outer quadrant of right female breast: Secondary | ICD-10-CM | POA: Diagnosis not present

## 2017-04-08 DIAGNOSIS — C50811 Malignant neoplasm of overlapping sites of right female breast: Secondary | ICD-10-CM | POA: Diagnosis not present

## 2017-04-08 NOTE — Progress Notes (Signed)
Pt denies any acute cardiopulmonary issues. Pt under the card of Dr. Skeet Latch, Cardiology. Pt denies having a stress test and echo but stated that a cardiac cath was performed; records requested from Dr. Oval Linsey (not in [place]). Pt made aware to stop taking vitamins, fish oil and herbal medications. Do not take any NSAIDs ie: Ibuprofen, Advil, Naproxen (Aleve), Motrin, BC and Goody Powder or any medication containing Aspirin. Pt verbalized understanding of all pre-op instructions.

## 2017-04-11 ENCOUNTER — Ambulatory Visit (HOSPITAL_COMMUNITY): Payer: Medicare Other | Admitting: Anesthesiology

## 2017-04-11 ENCOUNTER — Observation Stay (HOSPITAL_COMMUNITY)
Admission: RE | Admit: 2017-04-11 | Discharge: 2017-04-12 | Disposition: A | Discharge status: Home / Self Care | Payer: Medicare Other | Source: Ambulatory Visit | Attending: General Surgery | Admitting: General Surgery

## 2017-04-11 ENCOUNTER — Encounter (HOSPITAL_COMMUNITY)
Admission: RE | Disposition: A | Discharge status: Home / Self Care | Payer: Self-pay | Source: Ambulatory Visit | Attending: General Surgery

## 2017-04-11 ENCOUNTER — Encounter (HOSPITAL_COMMUNITY): Payer: Self-pay

## 2017-04-11 ENCOUNTER — Other Ambulatory Visit: Payer: Self-pay

## 2017-04-11 DIAGNOSIS — Z9841 Cataract extraction status, right eye: Secondary | ICD-10-CM | POA: Diagnosis not present

## 2017-04-11 DIAGNOSIS — Z886 Allergy status to analgesic agent status: Secondary | ICD-10-CM | POA: Insufficient documentation

## 2017-04-11 DIAGNOSIS — Z9889 Other specified postprocedural states: Secondary | ICD-10-CM | POA: Diagnosis not present

## 2017-04-11 DIAGNOSIS — C50911 Malignant neoplasm of unspecified site of right female breast: Secondary | ICD-10-CM | POA: Diagnosis present

## 2017-04-11 DIAGNOSIS — C50919 Malignant neoplasm of unspecified site of unspecified female breast: Secondary | ICD-10-CM | POA: Diagnosis present

## 2017-04-11 DIAGNOSIS — D0511 Intraductal carcinoma in situ of right breast: Secondary | ICD-10-CM | POA: Diagnosis not present

## 2017-04-11 DIAGNOSIS — C50411 Malignant neoplasm of upper-outer quadrant of right female breast: Secondary | ICD-10-CM | POA: Diagnosis not present

## 2017-04-11 DIAGNOSIS — Z9842 Cataract extraction status, left eye: Secondary | ICD-10-CM | POA: Insufficient documentation

## 2017-04-11 DIAGNOSIS — Z885 Allergy status to narcotic agent status: Secondary | ICD-10-CM | POA: Insufficient documentation

## 2017-04-11 DIAGNOSIS — N6489 Other specified disorders of breast: Secondary | ICD-10-CM | POA: Insufficient documentation

## 2017-04-11 DIAGNOSIS — M810 Age-related osteoporosis without current pathological fracture: Secondary | ICD-10-CM | POA: Diagnosis not present

## 2017-04-11 DIAGNOSIS — N6091 Unspecified benign mammary dysplasia of right breast: Secondary | ICD-10-CM | POA: Diagnosis not present

## 2017-04-11 DIAGNOSIS — Z87891 Personal history of nicotine dependence: Secondary | ICD-10-CM | POA: Diagnosis not present

## 2017-04-11 DIAGNOSIS — M1990 Unspecified osteoarthritis, unspecified site: Secondary | ICD-10-CM | POA: Insufficient documentation

## 2017-04-11 DIAGNOSIS — Z8249 Family history of ischemic heart disease and other diseases of the circulatory system: Secondary | ICD-10-CM | POA: Insufficient documentation

## 2017-04-11 DIAGNOSIS — Z808 Family history of malignant neoplasm of other organs or systems: Secondary | ICD-10-CM | POA: Insufficient documentation

## 2017-04-11 DIAGNOSIS — Z961 Presence of intraocular lens: Secondary | ICD-10-CM | POA: Insufficient documentation

## 2017-04-11 DIAGNOSIS — Z923 Personal history of irradiation: Secondary | ICD-10-CM | POA: Diagnosis not present

## 2017-04-11 DIAGNOSIS — R000 Tachycardia, unspecified: Secondary | ICD-10-CM | POA: Diagnosis not present

## 2017-04-11 DIAGNOSIS — I2510 Atherosclerotic heart disease of native coronary artery without angina pectoris: Secondary | ICD-10-CM | POA: Diagnosis not present

## 2017-04-11 DIAGNOSIS — Z888 Allergy status to other drugs, medicaments and biological substances status: Secondary | ICD-10-CM | POA: Insufficient documentation

## 2017-04-11 DIAGNOSIS — Z90710 Acquired absence of both cervix and uterus: Secondary | ICD-10-CM | POA: Insufficient documentation

## 2017-04-11 DIAGNOSIS — N6021 Fibroadenosis of right breast: Secondary | ICD-10-CM | POA: Insufficient documentation

## 2017-04-11 DIAGNOSIS — N6011 Diffuse cystic mastopathy of right breast: Secondary | ICD-10-CM | POA: Insufficient documentation

## 2017-04-11 DIAGNOSIS — Z800 Family history of malignant neoplasm of digestive organs: Secondary | ICD-10-CM | POA: Insufficient documentation

## 2017-04-11 DIAGNOSIS — I493 Ventricular premature depolarization: Secondary | ICD-10-CM | POA: Diagnosis not present

## 2017-04-11 DIAGNOSIS — E785 Hyperlipidemia, unspecified: Secondary | ICD-10-CM | POA: Diagnosis not present

## 2017-04-11 DIAGNOSIS — Z8261 Family history of arthritis: Secondary | ICD-10-CM | POA: Insufficient documentation

## 2017-04-11 DIAGNOSIS — Z836 Family history of other diseases of the respiratory system: Secondary | ICD-10-CM | POA: Insufficient documentation

## 2017-04-11 DIAGNOSIS — Z7951 Long term (current) use of inhaled steroids: Secondary | ICD-10-CM | POA: Diagnosis not present

## 2017-04-11 DIAGNOSIS — J449 Chronic obstructive pulmonary disease, unspecified: Secondary | ICD-10-CM | POA: Insufficient documentation

## 2017-04-11 DIAGNOSIS — Z85118 Personal history of other malignant neoplasm of bronchus and lung: Secondary | ICD-10-CM | POA: Insufficient documentation

## 2017-04-11 DIAGNOSIS — Z79899 Other long term (current) drug therapy: Secondary | ICD-10-CM | POA: Insufficient documentation

## 2017-04-11 DIAGNOSIS — Z9981 Dependence on supplemental oxygen: Secondary | ICD-10-CM | POA: Diagnosis not present

## 2017-04-11 DIAGNOSIS — Z803 Family history of malignant neoplasm of breast: Secondary | ICD-10-CM | POA: Insufficient documentation

## 2017-04-11 DIAGNOSIS — J9620 Acute and chronic respiratory failure, unspecified whether with hypoxia or hypercapnia: Secondary | ICD-10-CM | POA: Diagnosis not present

## 2017-04-11 DIAGNOSIS — K219 Gastro-esophageal reflux disease without esophagitis: Secondary | ICD-10-CM | POA: Diagnosis not present

## 2017-04-11 DIAGNOSIS — Z8041 Family history of malignant neoplasm of ovary: Secondary | ICD-10-CM | POA: Insufficient documentation

## 2017-04-11 DIAGNOSIS — Z91040 Latex allergy status: Secondary | ICD-10-CM | POA: Insufficient documentation

## 2017-04-11 HISTORY — PX: BREAST LUMPECTOMY WITH RADIOACTIVE SEED LOCALIZATION: SHX6424

## 2017-04-11 HISTORY — DX: Low back pain: M54.5

## 2017-04-11 HISTORY — DX: Pneumonia, unspecified organism: J18.9

## 2017-04-11 HISTORY — DX: Age-related osteoporosis without current pathological fracture: M81.0

## 2017-04-11 HISTORY — DX: Unspecified osteoarthritis, unspecified site: M19.90

## 2017-04-11 HISTORY — DX: Personal history of other diseases of the digestive system: Z87.19

## 2017-04-11 HISTORY — DX: Other allergy status, other than to drugs and biological substances: Z91.09

## 2017-04-11 HISTORY — DX: Dependence on supplemental oxygen: Z99.81

## 2017-04-11 HISTORY — DX: Gastro-esophageal reflux disease without esophagitis: K21.9

## 2017-04-11 HISTORY — DX: Adverse effect of unspecified anesthetic, initial encounter: T41.45XA

## 2017-04-11 HISTORY — DX: Malignant neoplasm of unspecified site of right female breast: C50.911

## 2017-04-11 HISTORY — DX: Presence of spectacles and contact lenses: Z97.3

## 2017-04-11 HISTORY — DX: Other chronic pain: G89.29

## 2017-04-11 HISTORY — DX: Other complications of anesthesia, initial encounter: T88.59XA

## 2017-04-11 HISTORY — DX: Low back pain, unspecified: M54.50

## 2017-04-11 HISTORY — DX: Cardiac arrhythmia, unspecified: I49.9

## 2017-04-11 LAB — CBC
HCT: 42.6 % (ref 36.0–46.0)
Hemoglobin: 13.4 g/dL (ref 12.0–15.0)
MCH: 28.2 pg (ref 26.0–34.0)
MCHC: 31.5 g/dL (ref 30.0–36.0)
MCV: 89.7 fL (ref 78.0–100.0)
Platelets: 199 10*3/uL (ref 150–400)
RBC: 4.75 MIL/uL (ref 3.87–5.11)
RDW: 14.2 % (ref 11.5–15.5)
WBC: 9 10*3/uL (ref 4.0–10.5)

## 2017-04-11 LAB — BASIC METABOLIC PANEL
ANION GAP: 8 (ref 5–15)
BUN: 7 mg/dL (ref 6–20)
CHLORIDE: 99 mmol/L — AB (ref 101–111)
CO2: 31 mmol/L (ref 22–32)
Calcium: 9.6 mg/dL (ref 8.9–10.3)
Creatinine, Ser: 0.49 mg/dL (ref 0.44–1.00)
GFR calc non Af Amer: >60 mL/min (ref >60)
GLUCOSE: 99 mg/dL (ref 65–99)
Potassium: 4.2 mmol/L (ref 3.5–5.1)
Sodium: 138 mmol/L (ref 135–145)

## 2017-04-11 SURGERY — BREAST LUMPECTOMY WITH RADIOACTIVE SEED LOCALIZATION
Anesthesia: Regional | Site: Breast | Laterality: Right

## 2017-04-11 MED ORDER — ONDANSETRON 4 MG PO TBDP: 4.0000 mg | ORAL_TABLET | Freq: Four times a day (QID) | ORAL | Status: DC | PRN

## 2017-04-11 MED ORDER — FENTANYL CITRATE (PF) 100 MCG/2ML IJ SOLN
100.0000 ug | Freq: Once | INTRAMUSCULAR | Status: AC
  Administered 2017-04-11: 25 ug via INTRAVENOUS

## 2017-04-11 MED ORDER — CEFAZOLIN SODIUM-DEXTROSE 2-4 GM/100ML-% IV SOLN
2.0000 g | INTRAVENOUS | Status: AC
  Administered 2017-04-11: 2 g via INTRAVENOUS

## 2017-04-11 MED ORDER — FENTANYL CITRATE (PF) 250 MCG/5ML IJ SOLN
INTRAMUSCULAR | Status: AC
  Filled 2017-04-11: qty 5

## 2017-04-11 MED ORDER — FLUTICASONE FUROATE-VILANTEROL 100-25 MCG/INH IN AEPB
1.0000 | INHALATION_SPRAY | Freq: Every day | RESPIRATORY_TRACT | Status: DC
  Administered 2017-04-12: 1 via RESPIRATORY_TRACT
  Filled 2017-04-11: qty 28

## 2017-04-11 MED ORDER — METOPROLOL TARTRATE 12.5 MG HALF TABLET
12.5000 mg | ORAL_TABLET | Freq: Two times a day (BID) | ORAL | Status: DC
  Administered 2017-04-11: 12.5 mg via ORAL
  Filled 2017-04-11: qty 1

## 2017-04-11 MED ORDER — PROMETHAZINE HCL 25 MG/ML IJ SOLN: 6.2500 mg | INTRAMUSCULAR | Status: DC | PRN

## 2017-04-11 MED ORDER — FLUTICASONE PROPIONATE 50 MCG/ACT NA SUSP
2.0000 | Freq: Every day | NASAL | Status: DC
  Filled 2017-04-11: qty 16

## 2017-04-11 MED ORDER — LIDOCAINE 2% (20 MG/ML) 5 ML SYRINGE
INTRAMUSCULAR | Status: AC
  Filled 2017-04-11: qty 5

## 2017-04-11 MED ORDER — ALBUTEROL SULFATE (2.5 MG/3ML) 0.083% IN NEBU: 3.0000 mL | INHALATION_SOLUTION | Freq: Four times a day (QID) | RESPIRATORY_TRACT | Status: DC | PRN

## 2017-04-11 MED ORDER — LORATADINE 10 MG PO TABS: 10.0000 mg | ORAL_TABLET | Freq: Every day | ORAL | Status: DC

## 2017-04-11 MED ORDER — ONDANSETRON HCL 4 MG/2ML IJ SOLN
INTRAMUSCULAR | Status: DC | PRN
  Administered 2017-04-11: 4 mg via INTRAVENOUS

## 2017-04-11 MED ORDER — LACTATED RINGERS IV SOLN
INTRAVENOUS | Status: DC | PRN
  Administered 2017-04-11: 11:00:00 via INTRAVENOUS

## 2017-04-11 MED ORDER — ACETAMINOPHEN 500 MG PO TABS
1000.0000 mg | ORAL_TABLET | Freq: Four times a day (QID) | ORAL | Status: DC
  Administered 2017-04-12: 1000 mg via ORAL
  Filled 2017-04-11: qty 2

## 2017-04-11 MED ORDER — FENTANYL CITRATE (PF) 100 MCG/2ML IJ SOLN: 25.0000 ug | INTRAMUSCULAR | Status: DC | PRN

## 2017-04-11 MED ORDER — TRAMADOL HCL 50 MG PO TABS
50.0000 mg | ORAL_TABLET | Freq: Four times a day (QID) | ORAL | Status: DC | PRN
Start: 2017-04-11 — End: 2017-04-12

## 2017-04-11 MED ORDER — LIDOCAINE HCL (PF) 1 % IJ SOLN
INTRAMUSCULAR | Status: AC
  Filled 2017-04-11: qty 30

## 2017-04-11 MED ORDER — MIDAZOLAM HCL 2 MG/2ML IJ SOLN
INTRAMUSCULAR | Status: AC
  Filled 2017-04-11: qty 2

## 2017-04-11 MED ORDER — PROPOFOL 10 MG/ML IV BOLUS
INTRAVENOUS | Status: AC
  Filled 2017-04-11: qty 20

## 2017-04-11 MED ORDER — LIDOCAINE-EPINEPHRINE 1 %-1:100000 IJ SOLN
INTRAMUSCULAR | Status: AC
  Filled 2017-04-11: qty 1

## 2017-04-11 MED ORDER — MEPERIDINE HCL 25 MG/ML IJ SOLN: 6.2500 mg | INTRAMUSCULAR | Status: DC | PRN

## 2017-04-11 MED ORDER — 0.9 % SODIUM CHLORIDE (POUR BTL) OPTIME
TOPICAL | Status: DC | PRN
  Administered 2017-04-11: 1000 mL

## 2017-04-11 MED ORDER — UMECLIDINIUM BROMIDE 62.5 MCG/INH IN AEPB
1.0000 | INHALATION_SPRAY | Freq: Every day | RESPIRATORY_TRACT | Status: DC
  Administered 2017-04-12: 1 via RESPIRATORY_TRACT
  Filled 2017-04-11: qty 7

## 2017-04-11 MED ORDER — MIDAZOLAM HCL 2 MG/2ML IJ SOLN: 2.0000 mg | Freq: Once | INTRAMUSCULAR | Status: DC

## 2017-04-11 MED ORDER — MONTELUKAST SODIUM 10 MG PO TABS
10.0000 mg | ORAL_TABLET | Freq: Every day | ORAL | Status: DC
Start: 2017-04-11 — End: 2017-04-12
  Administered 2017-04-11: 10 mg via ORAL
  Filled 2017-04-11: qty 1

## 2017-04-11 MED ORDER — SIMETHICONE 80 MG PO CHEW: 40.0000 mg | CHEWABLE_TABLET | Freq: Four times a day (QID) | ORAL | Status: DC | PRN

## 2017-04-11 MED ORDER — FENTANYL CITRATE (PF) 100 MCG/2ML IJ SOLN
INTRAMUSCULAR | Status: AC
  Administered 2017-04-11: 25 ug via INTRAVENOUS
  Filled 2017-04-11: qty 2

## 2017-04-11 MED ORDER — ONDANSETRON HCL 4 MG/2ML IJ SOLN
INTRAMUSCULAR | Status: AC
  Filled 2017-04-11: qty 2

## 2017-04-11 MED ORDER — GABAPENTIN 300 MG PO CAPS
ORAL_CAPSULE | ORAL | Status: AC
  Filled 2017-04-11: qty 1

## 2017-04-11 MED ORDER — LIDOCAINE-EPINEPHRINE 1 %-1:100000 IJ SOLN
INTRAMUSCULAR | Status: DC | PRN
  Administered 2017-04-11: 18 mL

## 2017-04-11 MED ORDER — ONDANSETRON HCL 4 MG/2ML IJ SOLN: 4.0000 mg | Freq: Four times a day (QID) | INTRAMUSCULAR | Status: DC | PRN

## 2017-04-11 MED ORDER — CEFAZOLIN SODIUM-DEXTROSE 2-4 GM/100ML-% IV SOLN
INTRAVENOUS | Status: AC
  Filled 2017-04-11: qty 100

## 2017-04-11 SURGICAL SUPPLY — 49 items
ADH SKN CLS APL DERMABOND .7 (GAUZE/BANDAGES/DRESSINGS) ×1
APPLIER CLIP 9.375 MED OPEN (MISCELLANEOUS)
APR CLP MED 9.3 20 MLT OPN (MISCELLANEOUS)
BINDER BREAST LRG (GAUZE/BANDAGES/DRESSINGS) ×2 IMPLANT
BINDER BREAST XLRG (GAUZE/BANDAGES/DRESSINGS) IMPLANT
BLADE SURG 15 STRL LF DISP TIS (BLADE) ×1 IMPLANT
BLADE SURG 15 STRL SS (BLADE) ×3
CANISTER SUCT 3000ML PPV (MISCELLANEOUS) ×3 IMPLANT
CHLORAPREP W/TINT 26ML (MISCELLANEOUS) ×3 IMPLANT
CLIP APPLIE 9.375 MED OPEN (MISCELLANEOUS) IMPLANT
CLOSURE WOUND 1/2 X4 (GAUZE/BANDAGES/DRESSINGS) ×1
COVER PROBE W GEL 5X96 (DRAPES) ×3 IMPLANT
COVER SURGICAL LIGHT HANDLE (MISCELLANEOUS) ×3 IMPLANT
DERMABOND ADVANCED (GAUZE/BANDAGES/DRESSINGS) ×2
DERMABOND ADVANCED .7 DNX12 (GAUZE/BANDAGES/DRESSINGS) ×1 IMPLANT
DEVICE DUBIN SPECIMEN MAMMOGRA (MISCELLANEOUS) ×3 IMPLANT
DRAPE CHEST BREAST 15X10 FENES (DRAPES) ×3 IMPLANT
DRAPE UTILITY XL STRL (DRAPES) ×3 IMPLANT
ELECT COATED BLADE 2.86 ST (ELECTRODE) ×3 IMPLANT
ELECT REM PT RETURN 9FT ADLT (ELECTROSURGICAL) ×3
ELECTRODE REM PT RTRN 9FT ADLT (ELECTROSURGICAL) ×1 IMPLANT
GLOVE BIO SURGEON STRL SZ7 (GLOVE) ×6 IMPLANT
GLOVE BIOGEL PI IND STRL 7.5 (GLOVE) ×1 IMPLANT
GLOVE BIOGEL PI INDICATOR 7.5 (GLOVE) ×2
GOWN STRL REUS W/ TWL LRG LVL3 (GOWN DISPOSABLE) ×2 IMPLANT
GOWN STRL REUS W/TWL LRG LVL3 (GOWN DISPOSABLE) ×6
ILLUMINATOR WAVEGUIDE N/F (MISCELLANEOUS) ×3 IMPLANT
KIT BASIN OR (CUSTOM PROCEDURE TRAY) ×3 IMPLANT
KIT MARKER MARGIN INK (KITS) ×3 IMPLANT
NDL HYPO 25GX1X1/2 BEV (NEEDLE) ×1 IMPLANT
NEEDLE HYPO 25GX1X1/2 BEV (NEEDLE) ×3 IMPLANT
NS IRRIG 1000ML POUR BTL (IV SOLUTION) ×3 IMPLANT
PACK SURGICAL SETUP 50X90 (CUSTOM PROCEDURE TRAY) ×3 IMPLANT
PENCIL BUTTON HOLSTER BLD 10FT (ELECTRODE) ×3 IMPLANT
SPONGE LAP 18X18 X RAY DECT (DISPOSABLE) ×3 IMPLANT
STRIP CLOSURE SKIN 1/2X4 (GAUZE/BANDAGES/DRESSINGS) ×2 IMPLANT
SUT MNCRL AB 4-0 PS2 18 (SUTURE) ×3 IMPLANT
SUT SILK 2 0 SH (SUTURE) IMPLANT
SUT VIC AB 2-0 SH 27 (SUTURE) ×3
SUT VIC AB 2-0 SH 27XBRD (SUTURE) ×1 IMPLANT
SUT VIC AB 3-0 SH 27 (SUTURE) ×3
SUT VIC AB 3-0 SH 27X BRD (SUTURE) ×1 IMPLANT
SYR BULB 3OZ (MISCELLANEOUS) ×3 IMPLANT
SYR CONTROL 10ML LL (SYRINGE) ×3 IMPLANT
TOWEL OR 17X24 6PK STRL BLUE (TOWEL DISPOSABLE) ×3 IMPLANT
TOWEL OR 17X26 10 PK STRL BLUE (TOWEL DISPOSABLE) ×3 IMPLANT
TUBE CONNECTING 12'X1/4 (SUCTIONS) ×1
TUBE CONNECTING 12X1/4 (SUCTIONS) ×2 IMPLANT
YANKAUER SUCT BULB TIP NO VENT (SUCTIONS) ×3 IMPLANT

## 2017-04-11 NOTE — Interval H&P Note (Signed)
History and Physical Interval Note:  04/11/2017 11:09 AM  Heather Barnes  has presented today for surgery, with the diagnosis of RIGHT BREAST CANCER  The various methods of treatment have been discussed with the patient and family. After consideration of risks, benefits and other options for treatment, the patient has consented to  Procedure(s): BREAST LUMPECTOMY WITH RADIOACTIVE SEED LOCALIZATION (Right) as a surgical intervention .  The patient's history has been reviewed, patient examined, no change in status, stable for surgery.  I have reviewed the patient's chart and labs.  Questions were answered to the patient's satisfaction.     Heather Barnes

## 2017-04-11 NOTE — Discharge Instructions (Signed)
[facility] °Office Phone Number [phone] °POST OP INSTRUCTIONS ° °Always review your discharge instruction sheet given to you by the facility where your surgery was performed. ° °IF YOU HAVE DISABILITY OR FAMILY LEAVE FORMS, YOU MUST BRING THEM TO THE OFFICE FOR PROCESSING.  DO NOT GIVE THEM TO YOUR DOCTOR. ° °1. A prescription for pain medication may be given to you upon discharge.  Take your pain medication as prescribed, if needed.  If narcotic pain medicine is not needed, then you may take acetaminophen (Tylenol), naprosyn (Alleve) or ibuprofen (Advil) as needed. °2. Take your usually prescribed medications unless otherwise directed °3. If you need a refill on your pain medication, please contact your pharmacy.  They will contact our office to request authorization.  Prescriptions will not be filled after 5pm or on week-ends. °4. You should eat very light the first 24 hours after surgery, such as soup, crackers, pudding, etc.  Resume your normal diet the day after surgery. °5. Most patients will experience some swelling and bruising in the breast.  Ice packs and a good support bra will help.  Wear the breast binder provided or a sports bra for 72 hours day and night.  After that wear a sports bra during the day until you return to the office. Swelling and bruising can take several days to resolve.  °6. It is common to experience some constipation if taking pain medication after surgery.  Increasing fluid intake and taking a stool softener will usually help or prevent this problem from occurring.  A mild laxative (Milk of Magnesia or Miralax) should be taken according to package directions if there are no bowel movements after 48 hours. °7. Unless discharge instructions indicate otherwise, you may remove your bandages 48 hours after surgery and you may shower at that time.  You may have steri-strips (small skin tapes) in place directly over the incision.  These strips should be left on the  skin for 7-10 days and will come off on their own.  If your surgeon used skin glue on the incision, you may shower in 24 hours.  The glue will flake off over the next 2-3 weeks.  Any sutures or staples will be removed at the office during your follow-up visit. °8. ACTIVITIES:  You may resume regular daily activities (gradually increasing) beginning the next day.  Wearing a good support bra or sports bra minimizes pain and swelling.  You may have sexual intercourse when it is comfortable. °a. You may drive when you no longer are taking prescription pain medication, you can comfortably wear a seatbelt, and you can safely maneuver your car and apply brakes. °b. RETURN TO WORK:  ______________________________________________________________________________________ °9. You should see your doctor in the office for a follow-up appointment approximately two weeks after your surgery.  Your doctor’s nurse will typically make your follow-up appointment when she calls you with your pathology report.  Expect your pathology report 3-4 business days after your surgery.  You may call to check if you do not hear from us after three days. °10. OTHER INSTRUCTIONS: _______________________________________________________________________________________________ _____________________________________________________________________________________________________________________________________ °_____________________________________________________________________________________________________________________________________ °_____________________________________________________________________________________________________________________________________ ° °WHEN TO CALL DR Glee Lashomb: °1. Fever over 101.0 °2. Nausea and/or vomiting. °3. Extreme swelling or bruising. °4. Continued bleeding from incision. °5. Increased pain, redness, or drainage from the incision. ° °The clinic staff is available to answer your questions during regular  business hours.  Please don’t hesitate to call and ask to speak to one of the nurses for clinical concerns.  If you   have a medical emergency, go to the nearest emergency room or call 911.  A surgeon from [facility] is always on call at the hospital. ° °For further questions, please visit [email or web address] ° °

## 2017-04-11 NOTE — Transfer of Care (Signed)
Immediate Anesthesia Transfer of Care Note  Patient: Heather Barnes  Procedure(s) Performed: RIGHT BREAST LUMPECTOMY WITH RADIOACTIVE SEED LOCALIZATION (Right Breast)  Patient Location: PACU  Anesthesia Type:MAC  Level of Consciousness: awake, alert , oriented and patient cooperative  Airway & Oxygen Therapy: Patient Spontanous Breathing and Patient connected to face mask oxygen  Post-op Assessment: Report given to RN and Post -op Vital signs reviewed and stable  Post vital signs: Reviewed  Last Vitals:  Vitals:   04/11/17 0923  BP: (!) 137/47  Pulse: 61  Resp: 20  Temp: 36.4 C  SpO2: 100%    Last Pain:  Vitals:   04/11/17 0923  TempSrc: Oral      Patients Stated Pain Goal: 3 (80/99/83 3825)  Complications: No apparent anesthesia complications

## 2017-04-11 NOTE — Progress Notes (Signed)
Patient arrived to 6n04, alert and oriented, pt reports no pain. Patient VSS on 3L O2, which is what she was on at home. IV fluids infusing, patient noted to have a incision to right breast with steri strips and breast binder on. Placed on cont. Pulse ox, oriented to room and staff, family on their way, will continue to monitor.

## 2017-04-11 NOTE — Anesthesia Procedure Notes (Addendum)
Anesthesia Regional Block: Pectoralis block   Pre-Anesthetic Checklist: ,, timeout performed, Correct Patient, Correct Site, Correct Laterality, Correct Procedure, Correct Position, site marked, Risks and benefits discussed,  Surgical consent,  Pre-op evaluation,  At surgeon's request and post-op pain management  Laterality: Right  Prep: chloraprep       Needles:  Injection technique: Single-shot  Needle Type: Stimiplex     Needle Length: 9cm      Additional Needles:   Procedures:,,,, ultrasound used (permanent image in chart),,,,  Narrative:  Start time: 04/11/2017 11:03 AM End time: 04/11/2017 11:08 AM Injection made incrementally with aspirations every 5 mL.  Performed by: Personally  Anesthesiologist: Nolon Nations, MD  Additional Notes: Patient tolerated well. Good fascial spread noted.

## 2017-04-11 NOTE — Anesthesia Procedure Notes (Signed)
Procedure Name: MAC Date/Time: 04/11/2017 11:39 AM Performed by: Jenne Campus, CRNA Pre-anesthesia Checklist: Patient identified, Emergency Drugs available, Suction available and Patient being monitored Oxygen Delivery Method: Simple face mask

## 2017-04-11 NOTE — Op Note (Signed)
Preoperative diagnosis: Right breast cancer, clinical stage 0 Postoperative diagnosis: same as above Procedure: Rightbreast seed guided lumpectomy Surgeon: Dr Serita Grammes [id number] Anes: general  Specimens  1. rightbreast tissue marked with paint containing seed 3. Additional right breast inferior, lateral, medial, superior and deep margins marked short superior long lateral double deep Complications none Drains none Sponge count correct Dispo to pacu stable  Indications: This is a76 yof with hg dcis that was er/pr negative.  We discussed all options and elected to do lumpectomy with seed guidance.  Procedure: After informed consent was obtained the patient was taken to the operating room.She had a pectoral block. She was given antibiotics. Sequential compression devices were on her legs. She was then placed under monitored anesthesia care with goal of just doing under local.  Then she was prepped and draped in the standard sterile surgical fashion. Surgical timeout was then performed.  I then located the seed in thelower outer right breastI infiltrated lidocaine around the area.  I made a curvilinear incision.I then used the neoprobe to remove the seed and the surrounding tissue with attempt to get clear margins. I marked this with paint. MM confirmed removal of seed and theclip.I did remove additional margins as above to try to ensure clear margins. I then obtained hemostasis.. This was marked as above.  I approximated the breast tissue with 2-0 vicryl. The skin was closed with 3-0 vicryl and 5-0 monocryl. Glue and steristrips were applied. Case was done entirely under local.

## 2017-04-11 NOTE — Anesthesia Preprocedure Evaluation (Addendum)
Anesthesia Evaluation  Patient identified by MRN, date of birth, ID band Patient awake    Reviewed: Allergy & Precautions, NPO status , Patient's Chart, lab work & pertinent test results  History of Anesthesia Complications (+) history of anesthetic complications  Airway Mallampati: II  TM Distance: <3 FB Neck ROM: Full    Dental no notable dental hx. (+) Teeth Intact, Dental Advisory Given   Pulmonary shortness of breath, asthma , pneumonia, COPD, former smoker,    Pulmonary exam normal breath sounds clear to auscultation       Cardiovascular + CAD  Normal cardiovascular exam+ dysrhythmias  Rhythm:Regular Rate:Normal     Neuro/Psych negative neurological ROS  negative psych ROS   GI/Hepatic Neg liver ROS, GERD  ,  Endo/Other  negative endocrine ROS  Renal/GU negative Renal ROS     Musculoskeletal  (+) Arthritis ,   Abdominal   Peds negative pediatric ROS (+)  Hematology negative hematology ROS (+)   Anesthesia Other Findings   Reproductive/Obstetrics negative OB ROS                            Anesthesia Physical Anesthesia Plan  ASA: IV  Anesthesia Plan: Regional   Post-op Pain Management:    Induction:   PONV Risk Score and Plan: 2 and Ondansetron  Airway Management Planned:   Additional Equipment:   Intra-op Plan:   Post-operative Plan:   Informed Consent: I have reviewed the patients History and Physical, chart, labs and discussed the procedure including the risks, benefits and alternatives for the proposed anesthesia with the patient or authorized representative who has indicated his/her understanding and acceptance.   Dental advisory given  Plan Discussed with: CRNA  Anesthesia Plan Comments:        Anesthesia Quick Evaluation

## 2017-04-11 NOTE — H&P (Signed)
Heather Barnes is an 83 y.o. female.   Chief Complaint: dcis  HPI: 53 yof I saw recently for dcis. she has history of lung cancer treated with radiotherapy. she has severe copd followed by Dr Chase Caller. she is on home oxygen and doesnt really have any exercise tolerance. she has been told to not have anesthesia. she underwent screening mm that showed right breast calcifications. she has family history of breast cancer in her daughter and her sister wirh ovarian cancer. she has 2.6 cm of uoq calcifications. she underwent US of axilla that is negative. biopsy was done showing hg dcis that is 5% er pos, pr negative. I have since discussed case with Dr Chase Caller. she is here to discuss final options today  Past Medical History:  Diagnosis Date  . Allergy    seasonal pollen  . Arthritis   . Asthma   . Cataract    bilat.cataract/lens implant  . Colon adenomas   . Complication of anesthesia    Must sit upright 45-60 degree angle per pulmonology  . COPD (chronic obstructive pulmonary disease) ([place])   . Coronary artery calcification 09/01/2016  . Dysrhythmia    bigeminy  . GERD (gastroesophageal reflux disease)   . Goiter   . History of radiation therapy 05/24/11-06/02/11   R middle lobe lung/ 60 gray  . Hx of colonic polyps   . Hyperlipidemia   . Lung cancer, middle lobe ([place]) 04/26/2011  . Osteoporosis   . Palpitations 01/19/2016   a. event monitor in 01/2016 showed PVC's with one episode of ventricular quadrigeminy.  . Pneumonia   . PVC (premature ventricular contraction) 02/24/2016  . Shortness of breath   . Wears glasses     Past Surgical History:  Procedure Laterality Date  . BIOPSY THYROID  2016  . COLONOSCOPY W/ BIOPSIES    . EYE SURGERY    . TONSILLECTOMY    . TOTAL ABDOMINAL HYSTERECTOMY      Family History  Problem Relation Age of Onset  . Emphysema Maternal Uncle   . Heart disease Mother   . Heart disease Maternal Grandfather   . Rheum arthritis Sister    . Cancer Sister   . Cancer Sister        throat cancer  . Brain cancer Sister   . Cancer Sister   . Ovarian cancer Sister   . Breast cancer Daughter    Social History:  reports that she quit smoking about 18 years ago. Her smoking use included cigarettes. She has a 20.00 pack-year smoking history. she has never used smokeless tobacco. She reports that she does not drink alcohol or use drugs.  Allergies:  Allergies  Allergen Reactions  . Tiotropium Bromide Monohydrate Hives, Shortness Of Breath and Rash    Spiriva   . Codeine Other (See Comments)    Hyperactivity,crying  . Fosamax [Alendronate] Other (See Comments)    Muscle aches  . Asa [Aspirin] Other (See Comments)    Bleeding -ulcers  . Latex Rash    Medications Prior to Admission  Medication Sig Dispense Refill  . albuterol (PROVENTIL HFA;VENTOLIN HFA) 108 (90 Base) MCG/ACT inhaler Inhale 2 puffs into the lungs every 6 (six) hours as needed for wheezing or shortness of breath.    [name] BREO ELLIPTA 100-25 MCG/INH AEPB INHALE ONE PUFF INTO THE LUNGS ONCE DAILY 60 each 11  . cetirizine (ZYRTEC) 10 MG tablet Take 10 mg by mouth daily.      . Cholecalciferol (VITAMIN D) 2000 UNITS  CAPS Take 2,000 Units by mouth 2 (two) times daily.     . fluticasone (FLONASE) 50 MCG/ACT nasal spray Place 2 sprays into the nose daily as needed for allergies or rhinitis.     . INCRUSE ELLIPTA 62.5 MCG/INH AEPB INHALE 1 PUFF BY MOUTH ONCE DAILY 30 each 5  . metoprolol tartrate (LOPRESSOR) 25 MG tablet Take 0.5 tablets (12.5 mg total) by mouth 2 (two) times daily. 90 tablet 1  . Omega-3 Fatty Acids (FISH OIL) 1000 MG CAPS Take 1,000 mg by mouth at bedtime.     [name] SINGULAIR 10 MG tablet Take 10 mg by mouth at bedtime.       Results for orders placed or performed during the hospital encounter of 04/11/17 (from the past 48 hour(s))  CBC     Status: None   Collection Time: 04/11/17  9:15 AM  Result Value Ref Range   WBC 9.0 4.0 - 10.5 K/uL   RBC 4.75  3.87 - 5.11 MIL/uL   Hemoglobin 13.4 12.0 - 15.0 g/dL   HCT 42.6 36.0 - 46.0 %   MCV 89.7 78.0 - 100.0 fL   MCH 28.2 26.0 - 34.0 pg   MCHC 31.5 30.0 - 36.0 g/dL   RDW 14.2 11.5 - 15.5 %   Platelets 199 150 - 400 K/uL  Basic metabolic panel     Status: Abnormal   Collection Time: 04/11/17  9:15 AM  Result Value Ref Range   Sodium 138 135 - 145 mmol/L   Potassium 4.2 3.5 - 5.1 mmol/L   Chloride 99 (L) 101 - 111 mmol/L   CO2 31 22 - 32 mmol/L   Glucose, Bld 99 65 - 99 mg/dL   BUN 7 6 - 20 mg/dL   Creatinine, Ser 0.49 0.44 - 1.00 mg/dL   Calcium 9.6 8.9 - 10.3 mg/dL   GFR calc non Af Amer >60 >60 mL/min   GFR calc Af Amer >60 >60 mL/min    Comment: (NOTE) The eGFR has been calculated using the CKD EPI equation. This calculation has not been validated in all clinical situations. eGFR's persistently <60 mL/min signify possible Chronic Kidney Disease.    Anion gap 8 5 - 15   No results found.  ROS General Present- Fatigue. Not Present- Appetite Loss, Chills, Fever, Night Sweats, Weight Gain and Weight Loss. Skin Present- Change in Wart/Mole. Not Present- Dryness, Hives, Jaundice, New Lesions, Non-Healing Wounds, Rash and Ulcer. HEENT Present- Hearing Loss and Wears glasses/contact lenses. Not Present- Earache, Hoarseness, Nose Bleed, Oral Ulcers, Ringing in the Ears, Seasonal Allergies, Sinus Pain, Sore Throat, Visual Disturbances and Yellow Eyes. Respiratory Present- Difficulty Breathing and Wheezing. Not Present- Bloody sputum, Chronic Cough and Snoring. Breast Present- Breast Mass and Breast Pain. Not Present- Nipple Discharge and Skin Changes. Cardiovascular Present- Palpitations, Rapid Heart Rate and Shortness of Breath. Not Present- Chest Pain, Difficulty Breathing Lying Down, Leg Cramps and Swelling of Extremities. Gastrointestinal Present- Bloating and Change in Bowel Habits. Not Present- Abdominal Pain, Bloody Stool, Chronic diarrhea, Constipation, Difficulty Swallowing,  Excessive gas, Gets full quickly at meals, Hemorrhoids, Indigestion, Nausea, Rectal Pain and Vomiting. Female Genitourinary Not Present- Frequency, Nocturia, Painful Urination, Pelvic Pain and Urgency. Musculoskeletal Present- Back Pain and Joint Pain. Not Present- Joint Stiffness, Muscle Pain, Muscle Weakness and Swelling of Extremities. Neurological Not Present- Decreased Memory, Fainting, Headaches, Numbness, Seizures, Tingling, Tremor, Trouble walking and Weakness. Psychiatric Present- Change in Sleep Pattern. Not Present- Anxiety, Bipolar, Depression, Fearful and Frequent crying. Endocrine Present- Cold  Intolerance. Not Present- Excessive Hunger, Hair Changes, Heat Intolerance, Hot flashes and New Diabetes. Hematology Present- Easy Bruising and Excessive bleeding. Not Present- Blood Thinners, Gland problems, HIV and Persistent Infections.  Blood pressure (!) 137/47, pulse 61, temperature 97.6 F (36.4 C), temperature source Oral, resp. rate 20, height 4' 10"  (1.473 m), weight 52.2 kg (115 lb), SpO2 100 %. Physical Exam  Physical Exam  General Mental Status-Alert. Orientation-Oriented X3. Head and Neck Trachea-midline. Thyroid Gland Characteristics - normal size and consistency. Eye Sclera/Conjunctiva - Bilateral-No scleral icterus. Chest and Lung Exam Chest and lung exam reveals -quiet, even and easy respiratory effort with no use of accessory muscles and on auscultation, normal breath sounds, no adventitious sounds and normal vocal resonance. Breast Nipples-No Discharge. Breast Lump-No Palpable Breast Mass. Cardiovascular Cardiovascular examination reveals -normal heart sounds, regular rate and rhythm with no murmurs. Lymphatic Head & Neck General Head & Neck Lymphatics: Bilateral - Description - Normal. Axillary General Axillary Region: Bilateral - Description - Normal. Note: no [place] adenopathy  Assessment/Plan BREAST NEOPLASM, TIS (DCIS), RIGHT  (D05.11) Story: Right breast seed guided lumpectomy I think options are really observation vs surgery. She has survived two significant events in last year and may very well live several more years. I am concerned with this lesion even though dcis ( she could very well have some invasive disease in this already). I think this area needs to be removed. I can do this under local with her up some. I will have anesthesia available if needed for sedation but this will certainly increase her risk. she will see anesthesia today and then we will proceed  Mitsuru Dault, MD 04/11/2017, 11:07 AM

## 2017-04-12 ENCOUNTER — Encounter (HOSPITAL_COMMUNITY): Payer: Self-pay | Admitting: General Surgery

## 2017-04-12 DIAGNOSIS — D0511 Intraductal carcinoma in situ of right breast: Secondary | ICD-10-CM | POA: Diagnosis not present

## 2017-04-12 DIAGNOSIS — N6091 Unspecified benign mammary dysplasia of right breast: Secondary | ICD-10-CM | POA: Diagnosis not present

## 2017-04-12 DIAGNOSIS — N6011 Diffuse cystic mastopathy of right breast: Secondary | ICD-10-CM | POA: Diagnosis not present

## 2017-04-12 DIAGNOSIS — N6021 Fibroadenosis of right breast: Secondary | ICD-10-CM | POA: Diagnosis not present

## 2017-04-12 DIAGNOSIS — N6489 Other specified disorders of breast: Secondary | ICD-10-CM | POA: Diagnosis not present

## 2017-04-12 DIAGNOSIS — I2510 Atherosclerotic heart disease of native coronary artery without angina pectoris: Secondary | ICD-10-CM | POA: Diagnosis not present

## 2017-04-12 NOTE — Progress Notes (Signed)
Heather Barnes to be D/C'd  per MD order. Discussed with the patient and all questions fully answered.  VSS, Skin clean, dry and intact without evidence of skin break down, no evidence of skin tears noted.  IV catheter discontinued intact. Site without signs and symptoms of complications. Dressing and pressure applied.  An After Visit Summary was printed and given to the patient. Patient received prescription.  D/c education completed with patient/family including follow up instructions, medication list, d/c activities limitations if indicated, with other d/c instructions as indicated by MD - patient able to verbalize understanding, all questions fully answered.   Patient instructed to return to ED, call 911, or call MD for any changes in condition.   Patient to be escorted via [place], and D/C home via private auto.

## 2017-04-12 NOTE — Discharge Summary (Signed)
Physician Discharge Summary  Patient ID: Heather Barnes MRN: [id number] DOB/AGE: [id number] 83 y.o.  Admit date: 04/11/2017 Discharge date: 04/12/2017  Admission Diagnoses: COPD Right breast cancer History lung cancer  Discharge Diagnoses:  Active Problems:   Breast cancer [facility])   Discharged Condition: good  Hospital Course: 83 yof underwent right seed guided lumpectomy under local. Admitted overnight due to severe copd. She is doing well the following am and is at her baseline  Consults: None  Significant Diagnostic Studies: none  Treatments: surgery: right seed guided lumpectomy  Discharge Exam: Blood pressure (!) 99/44, pulse 62, temperature 98.3 F (36.8 C), temperature source Oral, resp. rate 16, height 4\' 10"  (1.473 m), weight 56.3 kg (124 lb 1.9 oz), SpO2 99 %. Resp: occ rhonchi and wheezes Incision/Wound:clean without infection or hematoma  Disposition: 01-Home or Self Care   Allergies as of 04/12/2017      Reactions   Tiotropium Bromide Monohydrate Hives, Shortness Of Breath, Rash   Spiriva    Codeine Other (See Comments)   Hyperactivity,crying   Fosamax [alendronate] Other (See Comments)   Muscle aches   Asa [aspirin] Other (See Comments)   Bleeding -ulcers   Latex Rash      Medication List    TAKE these medications   albuterol 108 (90 Base) MCG/ACT inhaler Commonly known as:  PROVENTIL HFA;VENTOLIN HFA Inhale 2 puffs into the lungs every 6 (six) hours as needed for wheezing or shortness of breath.   BREO ELLIPTA 100-25 MCG/INH Aepb Generic drug:  fluticasone furoate-vilanterol INHALE ONE PUFF INTO THE LUNGS ONCE DAILY   cetirizine 10 MG tablet Commonly known as:  ZYRTEC Take 10 mg by mouth daily.   Fish Oil 1000 MG Caps Take 1,000 mg by mouth at bedtime.   fluticasone 50 MCG/ACT nasal spray Commonly known as:  FLONASE Place 2 sprays into the nose daily as needed for allergies or rhinitis.   INCRUSE ELLIPTA 62.5 MCG/INH Aepb Generic  drug:  umeclidinium bromide INHALE 1 PUFF BY MOUTH ONCE DAILY   metoprolol tartrate 25 MG tablet Commonly known as:  LOPRESSOR Take 0.5 tablets (12.5 mg total) by mouth 2 (two) times daily.   SINGULAIR 10 MG tablet Generic drug:  montelukast Take 10 mg by mouth at bedtime.   Vitamin D 2000 units Caps Take 2,000 Units by mouth 2 (two) times daily.      Follow-up Information    Rolm Bookbinder, MD Follow up in 2 week(s).   Specialty:  General Surgery Contact information: [address] [place] [address] [phone]           Signed: Rolm Bookbinder 04/12/2017, 7:58 AM

## 2017-04-12 NOTE — Anesthesia Postprocedure Evaluation (Signed)
Anesthesia Post Note  Patient: Heather Barnes  Procedure(s) Performed: RIGHT BREAST LUMPECTOMY WITH RADIOACTIVE SEED LOCALIZATION (Right Breast)     Patient location during evaluation: PACU Anesthesia Type: Regional Level of consciousness: awake and alert Pain management: pain level controlled Vital Signs Assessment: post-procedure vital signs reviewed and stable Respiratory status: spontaneous breathing Cardiovascular status: stable Anesthetic complications: no    Last Vitals:  Vitals:   04/12/17 0250 04/12/17 0600  BP: (!) 101/41 (!) 99/44  Pulse: 63 62  Resp: 17 16  Temp: 36.6 C 36.8 C  SpO2: 98% 99%    Last Pain:  Vitals:   04/12/17 0600  TempSrc: Oral  PainSc:                  Nolon Nations

## 2017-04-18 DIAGNOSIS — H35341 Macular cyst, hole, or pseudohole, right eye: Secondary | ICD-10-CM | POA: Diagnosis not present

## 2017-04-18 DIAGNOSIS — Z85118 Personal history of other malignant neoplasm of bronchus and lung: Secondary | ICD-10-CM | POA: Diagnosis not present

## 2017-04-18 DIAGNOSIS — C50911 Malignant neoplasm of unspecified site of right female breast: Secondary | ICD-10-CM | POA: Diagnosis not present

## 2017-04-18 DIAGNOSIS — H9193 Unspecified hearing loss, bilateral: Secondary | ICD-10-CM | POA: Diagnosis not present

## 2017-04-18 DIAGNOSIS — J9611 Chronic respiratory failure with hypoxia: Secondary | ICD-10-CM | POA: Diagnosis not present

## 2017-04-18 DIAGNOSIS — I2510 Atherosclerotic heart disease of native coronary artery without angina pectoris: Secondary | ICD-10-CM | POA: Diagnosis not present

## 2017-04-20 NOTE — Progress Notes (Signed)
[facility]  [phone] [phone]     ID: Heather Barnes DOB: 1933-10-08  MR#: [id number]  NLZ#:[id number]  Patient Care Team: [name], MD as PCP - General (Family Medicine) Brand Males, MD as Consulting Physician (Pulmonary Disease) Rolm Bookbinder, MD as Consulting Physician (General Surgery) Kennley Schwandt, Virgie Dad, MD as Consulting Physician (Oncology) Gery Pray, MD as Consulting Physician (Radiation Oncology) Skeet Latch, MD as Attending Physician (Cardiology) Gerda Diss, DO as Consulting Physician (Family Medicine) OTHER MD:  CHIEF COMPLAINT: Ductal carcinoma in situ  CURRENT TREATMENT: Observation   HISTORY OF CURRENT ILLNESS: From the original intake note:  Heather Barnes had routine bilateral screening mammography at [facility] 02/11/2017 showing the breast density to be category B. There were pleomorphic calcifications noted in the right breast upper outer quadrant and she was recalled for right diagnostic mammography 02/15/2017. This confirmed the finding and on 03/07/2017 she underwent right breast upper outer quadrant biopsy showing ([id number]) ductal carcinoma in situ, high-grade, weakly estrogen receptor positive at 5%, progesterone receptor negative. On the same day she had right axillary ultrasonography which showed no suspicious axillary lymph nodes  The patient also has a history of right middle lobe lung cancer, 0.8 cm on CT of the chest 12/25/2010, status post right middle lobe biopsy 04/14/2011, showing ([id number]) non-small cell lung cancer most consistent with squamous cell, treated with radiation only (60 gray, in 5 fractions, given between 05/24/2011 and 06/02/2011) (, with CT scan of the chest 12/22/2016 showing no evidence of local recurrence in the right middle lobe, but a new 0.4 cm left upper lobe solid nodule warranting repeat chest CT in 6 months.  The patient's subsequent history is as detailed  below.  INTERVAL HISTORY: [facility] returns today for follow-up of her weekly estrogen receptor positive breast cancer.  From a systemic therapy point of view she continues on observation alone. She is accompanied by her daughter Heather Barnes today.  Since her last visit here she underwent right lumpectomy, on 04/11/2017. The final pathology ([id number]) confirmed ductal carcinoma in situ, high-grade, measuring 2.0 cm. Ductal carcinoma in situ was broadly less than 0.1 cm to the posterior margin.  She notes that she did well with her recent lumpectomy and only had to take 4 tylenol pills since her surgery. She denies bleeding from the surgical site. She has no issues with range of motion of her right arm and she is able to raise her arm above her head without difficulty and with minimal pain. She hasn't been referred to Physical Therapy since her surgery.   REVIEW OF SYSTEMS: Heather Barnes reports that she doesn't exercise at this time, but she cooks while at home. She denies unusual headaches, visual changes, nausea, vomiting, or dizziness. There has been no unusual cough, phlegm production, or pleurisy. This been no change in bowel or bladder habits. She denies unexplained fatigue or unexplained weight loss, bleeding, rash, or fever. A detailed review of systems was otherwise stable.    PAST MEDICAL HISTORY: Past Medical History:  Diagnosis Date  . Arthritis    "joints" (04/11/2017)  . Asthma   . Breast cancer, right breast ([facility])    S/P RIGHT BREAST LUMPECTOMY WITH RADIOACTIVE SEED LOCALIZATION 04/11/2017  . Chronic lower back pain    "if I stand too long" (04/11/2017)  . Colon adenomas   . Complication of anesthesia    Must sit upright 45-60 degree angle per pulmonology  . COPD (chronic obstructive pulmonary disease) ([place])    "  severe" (04/11/2017)  . Coronary artery calcification 09/01/2016  . Dysrhythmia    bigeminy  . GERD (gastroesophageal reflux disease)   . Goiter   . History of duodenal  ulcer   . History of radiation therapy 05/24/11-06/02/11   R middle lobe lung/ 60 gray  . Hx of colonic polyps   . Hyperlipidemia   . Lung cancer, middle lobe ([place]) 04/26/2011   S/P radiation 05/24/11-06/02/11  . On home oxygen therapy    "3L; all the time" (04/11/2017)  . Osteoporosis   . Osteoporosis   . Palpitations 01/19/2016   a. event monitor in 01/2016 showed PVC's with one episode of ventricular quadrigeminy.  . Pneumonia 2017 X 2  . Pollen allergy   . PVC (premature ventricular contraction) 02/24/2016  . Shortness of breath   . Wears glasses     PAST SURGICAL HISTORY: Past Surgical History:  Procedure Laterality Date  . APPENDECTOMY    . BIOPSY THYROID  2016  . BREAST BIOPSY Right 03/2017  . BREAST LUMPECTOMY WITH RADIOACTIVE SEED LOCALIZATION Right 04/11/2017  . CATARACT EXTRACTION W/ INTRAOCULAR LENS  IMPLANT, BILATERAL Bilateral   . COLONOSCOPY W/ BIOPSIES    . DILATION AND CURETTAGE OF UTERUS  "many"  . RIGHT BREAST LUMPECTOMY WITH RADIOACTIVE SEED LOCALIZATION Right 04/11/2017   Performed by Rolm Bookbinder, MD at [place]  . TONSILLECTOMY    . TOTAL ABDOMINAL HYSTERECTOMY    Total hysterectomy at age 45.  FAMILY HISTORY Family History  Problem Relation Age of Onset  . Emphysema Maternal Uncle   . Heart disease Mother   . Heart disease Maternal Grandfather   . Rheum arthritis Sister   . Cancer Sister   . Cancer Sister        throat cancer  . Brain cancer Sister   . Cancer Sister   . Ovarian cancer Sister   . Breast cancer Daughter   Pt father died in his mid 43's from stroke and DM. Pt mother died at age 67. She has 2 brothers and 4 sisters. Pt sister had ovarian cancer that was diagnosed in her 21's at Stage IV. Her sister died at age 67. There is no family hx of breast cancer.    GYNECOLOGIC HISTORY:  Menarche: 83 years old Age at first live birth: 83 years old GP: GXP2 LMP: Approximately at age 60, when patient underwent hysterectomy with  bilateral salpingo-oophorectomy  Contraceptive:  remote OCP.  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 She is widowed. At home her daughter Heather [facility], a retired Pharmacist, Barnes currently on disability, lives with her. Son [name] lives in [place] and works as a Art gallery manager. The patient has 2 grandsons. She is a Protestant.  ADVANCED DIRECTIVES: Yes   HEALTH MAINTENANCE: Social History   Tobacco Use  . Smoking status: Former Smoker    Packs/day: 0.50    Years: 40.00    Pack years: 20.00    Types: Cigarettes    Last attempt to quit: 06/07/1998    Years since quitting: 18.8  . Smokeless tobacco: Never Used  Substance Use Topics  . Alcohol use: No  . Drug use: No     Colonoscopy: Age 53    PAP: in her 59's   Bone density: 2017    Allergies  Allergen Reactions  . Tiotropium Bromide Monohydrate Hives, Shortness Of Breath and Rash    Spiriva   . Codeine Other (See Comments)    Hyperactivity,crying  . Fosamax [Alendronate]  Other (See Comments)    Muscle aches  . Asa [Aspirin] Other (See Comments)    Bleeding -ulcers  . Latex Rash    Current Outpatient Medications  Medication Sig Dispense Refill  . albuterol (PROVENTIL HFA;VENTOLIN HFA) 108 (90 Base) MCG/ACT inhaler Inhale 2 puffs into the lungs every 6 (six) hours as needed for wheezing or shortness of breath.    [name] BREO ELLIPTA 100-25 MCG/INH AEPB INHALE ONE PUFF INTO THE LUNGS ONCE DAILY 60 each 11  . cetirizine (ZYRTEC) 10 MG tablet Take 10 mg by mouth daily.      . Cholecalciferol (VITAMIN D) 2000 UNITS CAPS Take 2,000 Units by mouth 2 (two) times daily.     . fluticasone (FLONASE) 50 MCG/ACT nasal spray Place 2 sprays into the nose daily as needed for allergies or rhinitis.     . INCRUSE ELLIPTA 62.5 MCG/INH AEPB INHALE 1 PUFF BY MOUTH ONCE DAILY 30 each 5  . metoprolol tartrate (LOPRESSOR) 25 MG tablet Take 0.5 tablets (12.5 mg total) by mouth 2 (two) times daily. 90 tablet 1  . Omega-3 Fatty Acids (FISH OIL)  1000 MG CAPS Take 1,000 mg by mouth at bedtime.     [name] SINGULAIR 10 MG tablet Take 10 mg by mouth at bedtime.      No current facility-administered medications for this visit.     OBJECTIVE: Elderly white woman using a wheelchair, with oxygen by nasal cannula in place  Vitals:   04/22/17 0938  BP: (!) 123/48  Pulse: 68  Resp: 16  Temp: 97.6 F (36.4 C)  SpO2: 97%     Body mass index is 24.98 kg/m.   Wt Readings from Last 3 Encounters:  04/22/17 119 lb 8 oz (54.2 kg)  04/11/17 124 lb 1.9 oz (56.3 kg)  03/16/17 118 lb 14.4 oz (53.9 kg)      ECOG FS:2 - Symptomatic, <50% confined to bed  Sclerae unicteric, pupils round and equal No cervical or supraclavicular adenopathy Lungs no rales or rhonchi Heart regular rate and rhythm Abd soft, nontender, positive bowel sounds MSK no focal spinal tenderness, no upper extremity lymphedema Neuro: nonfocal, well oriented, appropriate affect Breasts: The right breast is status post recent lumpectomy.  There is some subjacent induration inferiorly, but no erythema and no fluctuance.  There is no tenderness.  There is no dehiscence.  The left breast is unremarkable.  Both axillae are benign.  LAB RESULTS:  CMP     Component Value Date/Time   NA 138 04/11/2017 0915   NA 143 03/16/2017 0823   K 4.2 04/11/2017 0915   K 4.6 03/16/2017 0823   CL 99 (L) 04/11/2017 0915   CO2 31 04/11/2017 0915   CO2 35 (H) 03/16/2017 0823   GLUCOSE 99 04/11/2017 0915   GLUCOSE 91 03/16/2017 0823   BUN 7 04/11/2017 0915   BUN 9.9 03/16/2017 0823   CREATININE 0.49 04/11/2017 0915   CREATININE 0.7 03/16/2017 0823   CALCIUM 9.6 04/11/2017 0915   CALCIUM 10.0 03/16/2017 0823   PROT 6.9 03/16/2017 0823   ALBUMIN 3.9 03/16/2017 0823   AST 19 03/16/2017 0823   ALT 19 03/16/2017 0823   ALKPHOS 53 03/16/2017 0823   BILITOT 0.55 03/16/2017 0823   GFRNONAA >60 04/11/2017 0915   GFRAA >60 04/11/2017 0915    No results found for: TOTALPROTELP, ALBUMINELP,  A1GS, A2GS, BETS, BETA2SER, GAMS, MSPIKE, SPEI  No results found for: KPAFRELGTCHN, LAMBDASER, KAPLAMBRATIO  Lab Results  Component Value Date  WBC 9.0 04/11/2017   NEUTROABS 3.4 03/16/2017   HGB 13.4 04/11/2017   HCT 42.6 04/11/2017   MCV 89.7 04/11/2017   PLT 199 04/11/2017    _0 @  No results found for: LABCA2  No components found for: HCWCBJ628  No results for input(s): INR in the last 168 hours.  No results found for: LABCA2  No results found for: BTD176  No results found for: HYW737  No results found for: TGG269  No results found for: CA2729  No components found for: HGQUANT  No results found for: CEA1 / No results found for: CEA1   No results found for: AFPTUMOR  No results found for: CHROMOGRNA  No results found for: PSA1  No visits with results within 3 Day(s) from this visit.  Latest known visit with results is:  Admission on 04/11/2017, Discharged on 04/12/2017  Component Date Value Ref Range Status  . WBC 04/11/2017 9.0  4.0 - 10.5 K/uL Final  . RBC 04/11/2017 4.75  3.87 - 5.11 MIL/uL Final  . Hemoglobin 04/11/2017 13.4  12.0 - 15.0 g/dL Final  . HCT 04/11/2017 42.6  36.0 - 46.0 % Final  . MCV 04/11/2017 89.7  78.0 - 100.0 fL Final  . MCH 04/11/2017 28.2  26.0 - 34.0 pg Final  . MCHC 04/11/2017 31.5  30.0 - 36.0 g/dL Final  . RDW 04/11/2017 14.2  11.5 - 15.5 % Final  . Platelets 04/11/2017 199  150 - 400 K/uL Final  . Sodium 04/11/2017 138  135 - 145 mmol/L Final  . Potassium 04/11/2017 4.2  3.5 - 5.1 mmol/L Final  . Chloride 04/11/2017 99* 101 - 111 mmol/L Final  . CO2 04/11/2017 31  22 - 32 mmol/L Final  . Glucose, Bld 04/11/2017 99  65 - 99 mg/dL Final  . BUN 04/11/2017 7  6 - 20 mg/dL Final  . Creatinine, Ser 04/11/2017 0.49  0.44 - 1.00 mg/dL Final  . Calcium 04/11/2017 9.6  8.9 - 10.3 mg/dL Final  . GFR calc non Af Amer 04/11/2017 >60  >60 mL/min Final  . GFR calc Af Amer 04/11/2017 >60  >60 mL/min Final   Comment:  (NOTE) The eGFR has been calculated using the CKD EPI equation. This calculation has not been validated in all clinical situations. eGFR's persistently <60 mL/min signify possible Chronic Kidney Disease.   . Anion gap 04/11/2017 8  5 - 15 Final    (this displays the last labs from the last 3 days)  No results found for: TOTALPROTELP, ALBUMINELP, A1GS, A2GS, BETS, BETA2SER, GAMS, MSPIKE, SPEI (this displays SPEP labs)  No results found for: KPAFRELGTCHN, LAMBDASER, KAPLAMBRATIO (kappa/lambda light chains)  No results found for: HGBA, HGBA2QUANT, HGBFQUANT, HGBSQUAN (Hemoglobinopathy evaluation)   No results found for: LDH  No results found for: IRON, TIBC, IRONPCTSAT (Iron and TIBC)  No results found for: FERRITIN  Urinalysis    Component Value Date/Time   COLORURINE YELLOW 06/11/2015 1308   APPEARANCEUR CLOUDY (A) 06/11/2015 1308   LABSPEC 1.018 06/11/2015 1308   PHURINE 6.0 06/11/2015 1308   GLUCOSEU >1000 (A) 06/11/2015 1308   HGBUR TRACE (A) 06/11/2015 1308   BILIRUBINUR NEGATIVE 06/11/2015 1308   [place] 06/11/2015 1308   PROTEINUR NEGATIVE 06/11/2015 1308   NITRITE NEGATIVE 06/11/2015 1308   LEUKOCYTESUR SMALL (A) 06/11/2015 1308     STUDIES: No results found.  ELIGIBLE FOR AVAILABLE RESEARCH PROTOCOL: no  ASSESSMENT: 83 y.o. [place] woman Status post right breast upper outer quadrant biopsy 03/07/2017, for ductal  carcinoma in situ, high-grade, weakly estrogen receptor positive, progesterone receptor negative  (1) status post right lumpectomy 04/11/2017 for ductal carcinoma in situ, high-grade, measuring 2.0 cm.  Tumor was broadly less than 0.1 cm to the posterior margin  (2) despite the close deep margin I am comfortable foregoing adjuvant radiation given pulmonary history and limited functional status and morbidities  (3) no anti-estrogens planned given the low likelihood of benefit  (4) history of right middle lobe squamous cell lung  cancer, cT1A, status post radiation completed 06/02/2011  (a) left upper lobe nodule noted on CT scan July 2018 may warrant follow-up  PLAN: Annasophia did remarkably well with her surgery.  Recall she had no anesthesia.  She was awake through the whole procedure.  She only took one Tylenol that evening for pain.  She tells me she is planning to write it up because there was such a wonderful experience.  She does have a broadly close deep margin.  Normally we would proceed to radiation for this.  Given her comorbidities and the fact that she has already had radiation to the right lung, my advice to her is that we forego radiation.  This is also her preference.  However we will make sure her case is presented at conference to get radiation oncology input  She already sees pulmonary, cardiac, and will see Dr. Donne Hazel in December.  She also sees her primary care physician regularly.  Her next set of mammograms is in August.  Accordingly I am going to see her in September 2019 and yearly thereafter.  She knows to call for any problems that may develop before her next visit here.    Neizan Debruhl, Virgie Dad, MD  04/22/17 9:52 AM Medical Oncology and Hematology [facility] [address] [place], [place] [address] Tel. [phone]    Fax. [phone]    This document serves as a record of services personally performed by Lurline Del, MD. It was created on his behalf by Steva Colder, a trained medical scribe. The creation of this record is based on the scribe's personal observations and the provider's statements to them.   I have reviewed the above documentation for accuracy and completeness, and I agree with the above.

## 2017-04-22 ENCOUNTER — Ambulatory Visit (HOSPITAL_BASED_OUTPATIENT_CLINIC_OR_DEPARTMENT_OTHER): Payer: Medicare Other | Admitting: Oncology

## 2017-04-22 ENCOUNTER — Telehealth: Payer: Self-pay | Admitting: Oncology

## 2017-04-22 VITALS — BP 123/48 | HR 68 | Temp 97.6°F | Resp 16 | Ht <= 58 in | Wt 119.5 lb

## 2017-04-22 DIAGNOSIS — C342 Malignant neoplasm of middle lobe, bronchus or lung: Secondary | ICD-10-CM

## 2017-04-22 DIAGNOSIS — Z85118 Personal history of other malignant neoplasm of bronchus and lung: Secondary | ICD-10-CM

## 2017-04-22 DIAGNOSIS — Z86000 Personal history of in-situ neoplasm of breast: Secondary | ICD-10-CM | POA: Diagnosis not present

## 2017-04-22 DIAGNOSIS — D0511 Intraductal carcinoma in situ of right breast: Secondary | ICD-10-CM

## 2017-04-22 NOTE — Telephone Encounter (Signed)
Scheduled appt per 11/16 los - Gave patient AVS and calender per los,.

## 2017-05-09 ENCOUNTER — Ambulatory Visit (INDEPENDENT_AMBULATORY_CARE_PROVIDER_SITE_OTHER): Payer: Medicare Other | Admitting: Internal Medicine

## 2017-05-09 ENCOUNTER — Encounter: Payer: Self-pay | Admitting: Internal Medicine

## 2017-05-09 ENCOUNTER — Ambulatory Visit: Payer: Medicare Other | Admitting: Internal Medicine

## 2017-05-09 VITALS — BP 108/64 | HR 66 | Ht <= 58 in | Wt 119.4 lb

## 2017-05-09 DIAGNOSIS — I2510 Atherosclerotic heart disease of native coronary artery without angina pectoris: Secondary | ICD-10-CM | POA: Diagnosis not present

## 2017-05-09 DIAGNOSIS — J441 Chronic obstructive pulmonary disease with (acute) exacerbation: Secondary | ICD-10-CM

## 2017-05-09 DIAGNOSIS — B370 Candidal stomatitis: Secondary | ICD-10-CM

## 2017-05-09 DIAGNOSIS — I2584 Coronary atherosclerosis due to calcified coronary lesion: Secondary | ICD-10-CM

## 2017-05-09 DIAGNOSIS — R0982 Postnasal drip: Secondary | ICD-10-CM

## 2017-05-09 DIAGNOSIS — J449 Chronic obstructive pulmonary disease, unspecified: Secondary | ICD-10-CM

## 2017-05-09 DIAGNOSIS — J9611 Chronic respiratory failure with hypoxia: Secondary | ICD-10-CM

## 2017-05-09 MED ORDER — PREDNISONE 10 MG PO TABS: ORAL_TABLET | ORAL | 0 refills | Status: DC

## 2017-05-09 MED ORDER — NYSTATIN 100000 UNIT/ML MT SUSP
5.0000 mL | Freq: Four times a day (QID) | OROMUCOSAL | 0 refills | Status: DC
Start: 2017-05-09 — End: 2017-08-10

## 2017-05-09 MED ORDER — FLUTICASONE FUROATE-VILANTEROL 100-25 MCG/INH IN AEPB: 1.0000 | INHALATION_SPRAY | Freq: Every day | RESPIRATORY_TRACT | 0 refills | Status: DC

## 2017-05-09 NOTE — Patient Instructions (Addendum)
COPD, very severe ([place]) COPD exacerbation ([facility]) Chronic respiratory failure with hypoxia ([facility])   - mild flare up due to weather change and recent surgeries - Please take prednisone 40 mg x1 day, then 30 mg x1 day, then 20 mg x1 day, then 10 mg x1 day, and then 5 mg x1 day and stop   Oral thrush  - # Oral thrush - For Oral thrush: Take Suspension (swish and swallow): 500,000 units 4 times/day for 5 days; swish in the mouth and retain for as long as possible (several minutes) before swallowing  Post nasal drip - continue nasal saline and flonase  - if prednisone above does not address it we can look at ENT referral  Preop resp eval - love to talk to Dr Sherlynn Stalls before eye surgery  Followup 3 months or sooner if needed

## 2017-05-09 NOTE — Addendum Note (Signed)
Addended by: Lorretta Harp on: 05/09/2017 09:43 AM   Modules accepted: Orders

## 2017-05-09 NOTE — Progress Notes (Signed)
Subjective:     Patient ID: Heather Barnes, female   DOB: 1934/04/17, 83 y.o.   MRN: [id number]  HPI  OV 05/10/2016  Chief Complaint  Patient presents with  . Follow-up    Pt here for 72month f/u. Pt states she had pna in October but states she feel back to baseline. Pt c/o mild dry cough, PND. Pt denies CP/tightness.    Follow-up Gold stage IV COPD with chronic hypoxic respiratory failure  83 year old female. This is a follow-up from summer 2017. In October 2017 she was admitted for left upper lobe pneumonia. Chest x-ray was reviewed. She is back to stay on triple inhaler therapy. She continues on oxygen. Overall she's stable. There are no new issues with COPD standpoint. In the interim she's developed low back pain without any bladder or bowel disturbance. She's had 2 emergency department visits 04/24/2016 and 04/27/2016. The second one did a chest x-ray and she has T10 and T12 wedge compression fracture. This is severe and disabling her. She has an orthopedic appointment in a few days. She is taking appropriate pain medications.  OV 08/10/2016  Chief Complaint  Patient presents with  . Follow-up    f/u 3 months COPD, more SOB last couple of days, wheezing      Follow-up Gold stage IV COPD with chronic hypoxic respiratory failure  She is now 56. This is a routine follow-up. She is to follow-up back pain but it is better. Last visit December 2017. A follow-up chest x-ray for pneumonia and that showed improvement in infiltrates but she's not had a further follow-up of the chest x-ray. Now she is telling me for the last few days she's had increased sinus drainage, wheezing, chest tightness, cough that is worse than baseline. This no fever or edema or hemoptysis  OV 11/10/2016  Chief Complaint  Patient presents with  . Follow-up    Pt c/o slight increase in wheezing, increase in SOB, mild dry cough. Pt states this is due to the pollen. Pt denies CP/tightness and f/c/s.       Follow-up Gold stage IV COPD with chronic hypoxic respiratory failure Follow-up lung cancer surveillance   COPD: This is stable. She is on triple inhaler therapy. She is on oxygen. Today she needs a qualify oxygen saturation test. Her room a pulse ox is 88% on room air at rest. She is compliant with her inhalers. There are no new complaints.  Lung cancer surveillance: Last CT scan of the chest  was 2017 summer. She is interested in doing follow-up CT scan of the chest for recurrence surveillance this summer 2018  Past medical history: New issues that she had ventricular bigemini according to her history and was in the emergency department. She has follow-up with Dr. Skeet Latch upcoming she is asking if she can skip it because she has a grandson's graduation. She feels fine. Troponin checked in the ER was normal. This is based on my review of the chart.   OV 05/09/2017  Chief Complaint  Patient presents with  . Follow-up    Pt states that things have been a little worse since last visit. States that she is always SOB and has mild chest discomfort with exertion or if she is stressed and tired. Pt is wheezing today, 05/08/17.  DME: [facility], 2L O2      Gold stage IV advanced COPD patient and her lung cancer s/p XRT  COPD: In terms of COPD she is on triple inhaler therapy and oxygen.  Overall COPD stable but she has had some deterioration after recent breast cancer surgery and weather change.  She is having slight increase wheezing compared to baseline.  There is no colored sputum.  She is interested in a short prednisone burst for this.  New issue of worsening postnasal drip: She is complaining of worsening postnasal drip despite saline gel and also Flonase.  There is no discharge that is of discolored.  Lung cancer surveillance: She had CT scan of the chest summer 2018.  There was a new 4 mm nodule that requires follow-up in the future  New issue of preoperative clearance: She just  underwent a limited breast cancer surgery.  No radiation or chemo is planned.  She does have new right-sided retinal detachment and is seeing a Dr. Sherlynn Stalls.  When surgery is being contemplated for her she wants conversation with the ophthalmologist for this.  CAT COPD Symptom & Quality of Life Score (GSK trademark) 0 is no burden. 5 is highest burden 05/09/2017   Never Cough -> Cough all the time 3  No phlegm in chest -> Chest is full of phlegm 3  No chest tightness -> Chest feels very tight 3  No dyspnea for 1 flight stairs/hill -> Very dyspneic for 1 flight of stairs 5  No limitations for ADL at home -> Very limited with ADL at home 4  Confident leaving home -> Not at all confident leaving home 4  Sleep soundly -> Do not sleep soundly because of lung condition 5  Lots of Energy -> No energy at all 5  TOTAL Score (max 40)  32         has a past medical history of Arthritis, Asthma, Breast cancer, right breast ([place]), Chronic lower back pain, Colon adenomas, Complication of anesthesia, COPD (chronic obstructive pulmonary disease) ([place]), Coronary artery calcification (09/01/2016), Dysrhythmia, GERD (gastroesophageal reflux disease), Goiter, History of duodenal ulcer, History of radiation therapy (05/24/11-06/02/11), colonic polyps, Hyperlipidemia, Lung cancer, middle lobe ([place]) (04/26/2011), On home oxygen therapy, Osteoporosis, Osteoporosis, Palpitations (01/19/2016), Pneumonia (2017 X 2), Pollen allergy, PVC (premature ventricular contraction) (02/24/2016), Shortness of breath, and Wears glasses.   reports that she quit smoking about 18 years ago. Her smoking use included cigarettes. She has a 20.00 pack-year smoking history. she has never used smokeless tobacco.  Past Surgical History:  Procedure Laterality Date  . APPENDECTOMY    . BIOPSY THYROID  2016  . BREAST BIOPSY Right 03/2017  . BREAST LUMPECTOMY WITH RADIOACTIVE SEED LOCALIZATION Right 04/11/2017  . BREAST LUMPECTOMY WITH  RADIOACTIVE SEED LOCALIZATION Right 04/11/2017   Procedure: RIGHT BREAST LUMPECTOMY WITH RADIOACTIVE SEED LOCALIZATION;  Surgeon: Rolm Bookbinder, MD;  Location: [facility];  Service: General;  Laterality: Right;  . CATARACT EXTRACTION W/ INTRAOCULAR LENS  IMPLANT, BILATERAL Bilateral   . COLONOSCOPY W/ BIOPSIES    . DILATION AND CURETTAGE OF UTERUS  "many"  . TONSILLECTOMY    . TOTAL ABDOMINAL HYSTERECTOMY      Allergies  Allergen Reactions  . Tiotropium Bromide Monohydrate Hives, Shortness Of Breath and Rash    Spiriva   . Codeine Other (See Comments)    Hyperactivity,crying  . Fosamax [Alendronate] Other (See Comments)    Muscle aches  . Asa [Aspirin] Other (See Comments)    Bleeding -ulcers  . Latex Rash    Immunization History  Administered Date(s) Administered  . Influenza Split 04/07/2013, 03/07/2014  . Influenza Whole 02/06/2011, 03/07/2012  . Influenza, High Dose Seasonal PF 03/07/2016, 02/05/2017  .  Influenza,inj,Quad PF,6+ Mos 01/27/2015  . Influenza-Unspecified 01/05/2014  . Pneumococcal Conjugate-13 10/05/2013  . Pneumococcal Polysaccharide-23 06/08/2003  . Pneumococcal-Unspecified 11/05/2012    Family History  Problem Relation Age of Onset  . Emphysema Maternal Uncle   . Heart disease Mother   . Heart disease Maternal Grandfather   . Rheum arthritis Sister   . Cancer Sister   . Cancer Sister        throat cancer  . Brain cancer Sister   . Cancer Sister   . Ovarian cancer Sister   . Breast cancer Daughter      Current Outpatient Medications:  .  albuterol (PROVENTIL HFA;VENTOLIN HFA) 108 (90 Base) MCG/ACT inhaler, Inhale 2 puffs into the lungs every 6 (six) hours as needed for wheezing or shortness of breath., Disp: , Rfl:  .  BREO ELLIPTA 100-25 MCG/INH AEPB, INHALE ONE PUFF INTO THE LUNGS ONCE DAILY, Disp: 60 each, Rfl: 11 .  cetirizine (ZYRTEC) 10 MG tablet, Take 10 mg by mouth daily.  , Disp: , Rfl:  .  Cholecalciferol (VITAMIN D) 2000 UNITS CAPS,  Take 2,000 Units by mouth 2 (two) times daily. , Disp: , Rfl:  .  fluticasone (FLONASE) 50 MCG/ACT nasal spray, Place 2 sprays into the nose daily as needed for allergies or rhinitis. , Disp: , Rfl:  .  INCRUSE ELLIPTA 62.5 MCG/INH AEPB, INHALE 1 PUFF BY MOUTH ONCE DAILY, Disp: 30 each, Rfl: 5 .  metoprolol tartrate (LOPRESSOR) 25 MG tablet, Take 0.5 tablets (12.5 mg total) by mouth 2 (two) times daily., Disp: 90 tablet, Rfl: 1 .  Omega-3 Fatty Acids (FISH OIL) 1000 MG CAPS, Take 1,000 mg by mouth at bedtime. , Disp: , Rfl:  .  SINGULAIR 10 MG tablet, Take 10 mg by mouth at bedtime. , Disp: , Rfl:   Review of Systems     Objective:   Physical Exam  Constitutional: She is oriented to person, place, and time. She appears well-developed and well-nourished. No distress.  Looks well  HENT:  Head: Normocephalic and atraumatic.  Right Ear: External ear normal.  Left Ear: External ear normal.  Mouth/Throat: Oropharynx is clear and moist. No oropharyngeal exudate.  Thrush+  Eyes: Conjunctivae and EOM are normal. Pupils are equal, round, and reactive to light. Right eye exhibits no discharge. Left eye exhibits no discharge. No scleral icterus.  Neck: Normal range of motion. Neck supple. No JVD present. No tracheal deviation present. No thyromegaly present.  Cardiovascular: Normal rate, regular rhythm, normal heart sounds and intact distal pulses. Exam reveals no gallop and no friction rub.  No murmur heard. Pulmonary/Chest: Effort normal. No respiratory distress. She has wheezes. She has no rales. She exhibits no tenderness.  Purse lip breathing Wheeze bialterally lower lobe  Abdominal: Soft. Bowel sounds are normal. She exhibits no distension and no mass. There is no tenderness. There is no rebound and no guarding.  Musculoskeletal: Normal range of motion. She exhibits no edema or tenderness.  Lymphadenopathy:    She has no cervical adenopathy.  Neurological: She is alert and oriented to  person, place, and time. She has normal reflexes. No cranial nerve deficit. She exhibits normal muscle tone. Coordination normal.  Skin: Skin is warm and dry. No rash noted. She is not diaphoretic. No erythema. No pallor.  Psychiatric: She has a normal mood and affect. Her behavior is normal. Judgment and thought content normal.  Vitals reviewed.  Vitals:   05/09/17 0858  BP: 108/64  Pulse: 66  SpO2:  100%  Weight: 119 lb 6.4 oz (54.2 kg)  Height: 4\' 10"  (1.473 m)    Estimated body mass index is 24.95 kg/m as calculated from the following:   Height as of this encounter: 4\' 10"  (1.473 m).   Weight as of this encounter: 119 lb 6.4 oz (54.2 kg).     Assessment:       ICD-10-CM   1. COPD, very severe ([place]) J44.9   2. COPD exacerbation ([place]) J44.1   3. Chronic respiratory failure with hypoxia ([facility]) J96.11   4. Oral thrush B37.0   5. Post-nasal drip R09.82        Plan:     COPD, very severe ([place]) COPD exacerbation (H[facility]) Chronic respiratory failure with hypoxia ([facility])   - mild flare up due to weather change and recent surgeries - Please take prednisone 40 mg x1 day, then 30 mg x1 day, then 20 mg x1 day, then 10 mg x1 day, and then 5 mg x1 day and stop   Oral thrush  - # Oral thrush - For Oral thrush: Take Suspension (swish and swallow): 500,000 units 4 times/day for 5 days; swish in the mouth and retain for as long as possible (several minutes) before swallowing  Post nasal drip - continue nasal saline and flonase  - if prednisone above does not address it we can look at ENT referral  Preop resp eval - love to talk to Dr Sherlynn Stalls before eye surgery  Lung nodule: We will decide CT scan at follow-up  Followup 3 months or sooner if needed   Dr. Brand Males, M.D., [facility] Pulmonary and Critical Care Medicine Staff Physician, [place] Director - Interstitial Lung Disease  Program  Pulmonary [place] at [place], [place], [address]  Pager: [phone], If no answer or between  15:00h - 7:00h: call [phone] Telephone: [phone]

## 2017-05-11 ENCOUNTER — Encounter: Payer: Self-pay | Admitting: Cardiovascular Disease

## 2017-05-11 ENCOUNTER — Ambulatory Visit (INDEPENDENT_AMBULATORY_CARE_PROVIDER_SITE_OTHER): Payer: Medicare Other | Admitting: Cardiovascular Disease

## 2017-05-11 VITALS — BP 122/64 | HR 72 | Ht <= 58 in | Wt 119.2 lb

## 2017-05-11 DIAGNOSIS — I493 Ventricular premature depolarization: Secondary | ICD-10-CM

## 2017-05-11 DIAGNOSIS — E7800 Pure hypercholesterolemia, unspecified: Secondary | ICD-10-CM | POA: Diagnosis not present

## 2017-05-11 DIAGNOSIS — I259 Chronic ischemic heart disease, unspecified: Secondary | ICD-10-CM

## 2017-05-11 MED ORDER — METOPROLOL TARTRATE 25 MG PO TABS
12.5000 mg | ORAL_TABLET | Freq: Two times a day (BID) | ORAL | 3 refills | Status: DC
Start: 2017-05-11 — End: 2018-04-06

## 2017-05-11 NOTE — Progress Notes (Signed)
Cardiology Office Note   Date:  05/11/2017   ID:  Heather Barnes, Heather Barnes 11/05/1933, MRN [id number]  PCP:  [name], MD  Cardiologist:   Skeet Latch, MD   No chief complaint on file.     History of Present Illness: Heather Barnes is a 83 y.o. female with asymptomatic coronary calcifications, PVCs, hyperlipidemia, severe COPD, breast cancer, and lung cancer in remission who presents for follow up.  She was initially evaluated 01/2015 due to coronary calcifications in the LM, LAD and RCA identified on cardiac CT.   [name] reported exertional dyspnea that had been getting progressively worse for years.  This was attributed to both her COPD and lung cancer and she declined stress testing.  [name] can't tolerate aspirin due to bleeding ulcers.  She was started on atorvastatin but developed myalgias.  She was hospitalized with pneumonia 06/2015 and again 03/2016.  Since then she has been struggling to recover and remains short of breath.  [name] previously reported frequent palpitations.  She was referred for a 7 day event monitor 01/2016 that showed occasional PVCs with an episode of ventricular quadrigeminy.  She notes that while wearing the monitor her oxygen levels were dropping with minimal exertion.  She also developed a compression fracture and continues to have back pain.  Since her last appointment [name] was diagnosed with breast cancer on a routine mammogram 02/2017.  Biopsy 03/07/17 revealed ductal carcinoma in situ, high-grade, weakly ER positive, PR negative.  [name] was admitted for lumpectomy 04/12/17.  She spent the night due to her severe COPD.  She followed up with Dr. Anastasia Pall who typically would recommend radiation given her close margins.  However, given her underlying lung disease this was not pursued.  [name] has been feeling well.  She denies any chest pain and her breathing has been stable.  She has a tear in her right retina and will need to have a  procedure to have a bubble implanted and improve her vision.  Right now she has a black circle in the center of her right vision.  She is concerned about her ability to go through the procedure because she will have to lay flat on her stomach.  She has trouble breathing when she is sitting upright she has not noted any lower extremity edema, orthopnea, or PND.[name]     Past Medical History:  Diagnosis Date  . Arthritis    "joints" (04/11/2017)  . Asthma   . Breast cancer, right breast ([place])    S/P RIGHT BREAST LUMPECTOMY WITH RADIOACTIVE SEED LOCALIZATION 04/11/2017  . Chronic lower back pain    "if I stand too long" (04/11/2017)  . Colon adenomas   . Complication of anesthesia    Must sit upright 45-60 degree angle per pulmonology  . COPD (chronic obstructive pulmonary disease) ([place])    "severe" (04/11/2017)  . Coronary artery calcification 09/01/2016  . Dysrhythmia    bigeminy  . GERD (gastroesophageal reflux disease)   . Goiter   . History of duodenal ulcer   . History of radiation therapy 05/24/11-06/02/11   R middle lobe lung/ 60 gray  . Hx of colonic polyps   . Hyperlipidemia   . Lung cancer, middle lobe ([place]) 04/26/2011   S/P radiation 05/24/11-06/02/11  . On home oxygen therapy    "3L; all the time" (04/11/2017)  . Osteoporosis   . Osteoporosis   . Palpitations 01/19/2016   a. event monitor in 01/2016  showed PVC's with one episode of ventricular quadrigeminy.  . Pneumonia 2017 X 2  . Pollen allergy   . PVC (premature ventricular contraction) 02/24/2016  . Shortness of breath   . Wears glasses     Past Surgical History:  Procedure Laterality Date  . APPENDECTOMY    . BIOPSY THYROID  2016  . BREAST BIOPSY Right 03/2017  . BREAST LUMPECTOMY WITH RADIOACTIVE SEED LOCALIZATION Right 04/11/2017  . BREAST LUMPECTOMY WITH RADIOACTIVE SEED LOCALIZATION Right 04/11/2017   Procedure: RIGHT BREAST LUMPECTOMY WITH RADIOACTIVE SEED LOCALIZATION;  Surgeon: Rolm Bookbinder, MD;   Location: [facility];  Service: General;  Laterality: Right;  . CATARACT EXTRACTION W/ INTRAOCULAR LENS  IMPLANT, BILATERAL Bilateral   . COLONOSCOPY W/ BIOPSIES    . DILATION AND CURETTAGE OF UTERUS  "many"  . TONSILLECTOMY    . TOTAL ABDOMINAL HYSTERECTOMY       Current Outpatient Medications  Medication Sig Dispense Refill  . albuterol (PROVENTIL HFA;VENTOLIN HFA) 108 (90 Base) MCG/ACT inhaler Inhale 2 puffs into the lungs every 6 (six) hours as needed for wheezing or shortness of breath.    [name] BREO ELLIPTA 100-25 MCG/INH AEPB INHALE ONE PUFF INTO THE LUNGS ONCE DAILY 60 each 11  . cetirizine (ZYRTEC) 10 MG tablet Take 10 mg by mouth daily.      . Cholecalciferol (VITAMIN D) 2000 UNITS CAPS Take 2,000 Units by mouth 2 (two) times daily.     . fluticasone (FLONASE) 50 MCG/ACT nasal spray Place 2 sprays into the nose daily as needed for allergies or rhinitis.     . INCRUSE ELLIPTA 62.5 MCG/INH AEPB INHALE 1 PUFF BY MOUTH ONCE DAILY 30 each 5  . metoprolol tartrate (LOPRESSOR) 25 MG tablet Take 0.5 tablets (12.5 mg total) by mouth 2 (two) times daily. 90 tablet 3  . nystatin (MYCOSTATIN) 100000 UNIT/ML suspension Take 5 mLs (500,000 Units total) by mouth 4 (four) times daily. 110 mL 0  . Omega-3 Fatty Acids (FISH OIL) 1000 MG CAPS Take 1,000 mg by mouth at bedtime.     . predniSONE (DELTASONE) 10 MG tablet Take 40mg  x1 day, 30mg  x1 day, 20mg  x1 day, 10mg  x1 day, 5mg  x1 day, then stop 11 tablet 0  . SINGULAIR 10 MG tablet Take 10 mg by mouth at bedtime.      No current facility-administered medications for this visit.     Allergies:   Tiotropium bromide monohydrate; Codeine; Fosamax [alendronate]; Asa [aspirin]; and Latex    Social History:  The patient  reports that she quit smoking about 18 years ago. Her smoking use included cigarettes. She has a 20.00 pack-year smoking history. she has never used smokeless tobacco. She reports that she does not drink alcohol or use drugs.   Family  History:  The patient's family history includes Brain cancer in her sister; Breast cancer in her daughter; Cancer in her sister, sister, and sister; Emphysema in her maternal uncle; Heart disease in her maternal grandfather and mother; Ovarian cancer in her sister; Rheum arthritis in her sister.    ROS:  Please see the history of present illness.   Otherwise, review of systems are positive for none.   All other systems are reviewed and negative.    PHYSICAL EXAM: VS:  BP 122/64   Pulse 72   Ht 4\' 10"  (1.473 m)   Wt 119 lb 3.2 oz (54.1 kg)   BMI 24.91 kg/m  , BMI Body mass index is 24.91 kg/m. GENERAL:  Well appearing.  No acute distress.  Wearing oxygen.  HEENT: Pupils equal round and reactive, fundi not visualized, oral mucosa unremarkable NECK:  No jugular venous distention, waveform within normal limits, carotid upstroke brisk and symmetric, no bruits, no thyromegaly LUNGS:  Clear to auscultation bilaterally HEART:  RRR.  PMI not displaced or sustained,S1 and S2 within normal limits, no S3, no S4, no clicks, no rubs, no murmurs ABD:  Flat, positive bowel sounds normal in frequency in pitch, no bruits, no rebound, no guarding, no midline pulsatile mass, no hepatomegaly, no splenomegaly EXT:  2 plus pulses throughout, no edema, no cyanosis no clubbing SKIN:  No rashes no nodules NEURO:  Cranial nerves II through XII grossly intact, motor grossly intact throughout PSYCH:  Cognitively intact, oriented to person place and time   EKG:  EKG is ordered today. The ekg ordered 01/19/16 demonstrates sinus rhythm at 82 bpm.   09/01/16: Sinus rhythm.  Rate 78 bpm.   05/11/17: Sinus rhythm.  Rate 72 bpm.  7 Day Event Monitor 01/21/16:  Quality: Fair.  Baseline artifact. Predominant rhythm: sinus  Average heart rate: 77 bpm Max heart rate: 110 bpm Min heart rate: 64 bpm  Occasional PVCs, with an episode of ventricular quadrigeminy.    Chest CT 12/20/14: Atherosclerosis of the thoracic  aorta, mediastinal great vessels, LM, LAD and RCA.  Recent Labs: 03/16/2017: ALT 19 04/11/2017: BUN 7; Creatinine, Ser 0.49; Hemoglobin 13.4; Platelets 199; Potassium 4.2; Sodium 138   03/16/17: Sodium 138, potassium 4.2, BUN 7, creatinine 0.49 AST 19, ALT 19 WBC 9, hemoglobin 13.4, hematocrit 42.6, platelets 199 Total cholesterol 270, triglycerides 103, HDL 74, LDL 175  Lipid Panel    Component Value Date/Time   CHOL 186 01/21/2016 1126   TRIG 97 01/21/2016 1126   HDL 86 01/21/2016 1126   CHOLHDL 2.2 01/21/2016 1126   VLDL 19 01/21/2016 1126   LDLCALC 81 01/21/2016 1126      Wt Readings from Last 3 Encounters:  05/11/17 119 lb 3.2 oz (54.1 kg)  05/09/17 119 lb 6.4 oz (54.2 kg)  04/22/17 119 lb 8 oz (54.2 kg)      ASSESSMENT AND PLAN:  # Asymptomatic coronary calcifications:  # Hyperlipidemia:  Ms. Maxie Better remains asymptomatic.  Continue metoprolol.  She is unable to tolerate aspirin or atorvastatin.  She is currently taking fish oil and does not want to try any other statins or PCSK9 inhibitors.  LDL 175 10/2016.  # PVCs: Symptoms improved on metoprolol.   # Hypertension: Blood pressure well-controlled.  Continue metoprolol.   Current medicines are reviewed at length with the patient today.  The patient does not have concerns regarding medicines.  The following changes have been made: none   Labs/ tests ordered today include: none  No orders of the defined types were placed in this encounter.    Disposition:   FU with Dr. Jonelle Sidle C. Lynch in 6 months.   Signed, Skeet Latch, MD  05/11/2017 12:51 PM    [facility]

## 2017-05-11 NOTE — Patient Instructions (Signed)

## 2017-06-02 DIAGNOSIS — H43822 Vitreomacular adhesion, left eye: Secondary | ICD-10-CM | POA: Diagnosis not present

## 2017-06-02 DIAGNOSIS — D3132 Benign neoplasm of left choroid: Secondary | ICD-10-CM | POA: Diagnosis not present

## 2017-06-02 DIAGNOSIS — H353211 Exudative age-related macular degeneration, right eye, with active choroidal neovascularization: Secondary | ICD-10-CM | POA: Diagnosis not present

## 2017-06-02 DIAGNOSIS — H43813 Vitreous degeneration, bilateral: Secondary | ICD-10-CM | POA: Diagnosis not present

## 2017-07-04 DIAGNOSIS — H353122 Nonexudative age-related macular degeneration, left eye, intermediate dry stage: Secondary | ICD-10-CM | POA: Diagnosis not present

## 2017-07-04 DIAGNOSIS — H43822 Vitreomacular adhesion, left eye: Secondary | ICD-10-CM | POA: Diagnosis not present

## 2017-07-04 DIAGNOSIS — H43813 Vitreous degeneration, bilateral: Secondary | ICD-10-CM | POA: Diagnosis not present

## 2017-07-04 DIAGNOSIS — H353211 Exudative age-related macular degeneration, right eye, with active choroidal neovascularization: Secondary | ICD-10-CM | POA: Diagnosis not present

## 2017-07-29 ENCOUNTER — Ambulatory Visit: Payer: Medicare Other | Admitting: Internal Medicine

## 2017-08-10 ENCOUNTER — Ambulatory Visit (INDEPENDENT_AMBULATORY_CARE_PROVIDER_SITE_OTHER): Payer: Medicare Other | Admitting: Internal Medicine

## 2017-08-10 ENCOUNTER — Encounter: Payer: Self-pay | Admitting: Internal Medicine

## 2017-08-10 VITALS — BP 110/64 | HR 63 | Ht <= 58 in | Wt 120.8 lb

## 2017-08-10 DIAGNOSIS — J449 Chronic obstructive pulmonary disease, unspecified: Secondary | ICD-10-CM | POA: Diagnosis not present

## 2017-08-10 DIAGNOSIS — J9611 Chronic respiratory failure with hypoxia: Secondary | ICD-10-CM | POA: Diagnosis not present

## 2017-08-10 DIAGNOSIS — J441 Chronic obstructive pulmonary disease with (acute) exacerbation: Secondary | ICD-10-CM | POA: Diagnosis not present

## 2017-08-10 MED ORDER — PREDNISONE 10 MG PO TABS: 10.0000 mg | ORAL_TABLET | Freq: Every day | ORAL | 0 refills | Status: DC

## 2017-08-10 MED ORDER — PREDNISONE 10 MG PO TABS: ORAL_TABLET | ORAL | 0 refills | Status: DC

## 2017-08-10 NOTE — Progress Notes (Signed)
Subjective:     Patient ID: Heather Barnes, female   DOB: 1933-06-24, 84 y.o.   MRN: [id number]  HPI  OV 05/10/2016  Chief Complaint  Patient presents with  . Follow-up    Pt here for 50month f/u. Pt states she had pna in October but states she feel back to baseline. Pt c/o mild dry cough, PND. Pt denies CP/tightness.    Follow-up Gold stage IV COPD with chronic hypoxic respiratory failure  84 year old female. This is a follow-up from summer 2017. In October 2017 she was admitted for left upper lobe pneumonia. Chest x-ray was reviewed. She is back to stay on triple inhaler therapy. She continues on oxygen. Overall she's stable. There are no new issues with COPD standpoint. In the interim she's developed low back pain without any bladder or bowel disturbance. She's had 2 emergency department visits 04/24/2016 and 04/27/2016. The second one did a chest x-ray and she has T10 and T12 wedge compression fracture. This is severe and disabling her. She has an orthopedic appointment in a few days. She is taking appropriate pain medications.  OV 08/10/2016  Chief Complaint  Patient presents with  . Follow-up    f/u 3 months COPD, more SOB last couple of days, wheezing      Follow-up Gold stage IV COPD with chronic hypoxic respiratory failure  She is now 50. This is a routine follow-up. She is to follow-up back pain but it is better. Last visit December 2017. A follow-up chest x-ray for pneumonia and that showed improvement in infiltrates but she's not had a further follow-up of the chest x-ray. Now she is telling me for the last few days she's had increased sinus drainage, wheezing, chest tightness, cough that is worse than baseline. This no fever or edema or hemoptysis  OV 11/10/2016  Chief Complaint  Patient presents with  . Follow-up    Pt c/o slight increase in wheezing, increase in SOB, mild dry cough. Pt states this is due to the pollen. Pt denies CP/tightness and f/c/s.       Follow-up Gold stage IV COPD with chronic hypoxic respiratory failure Follow-up lung cancer surveillance   COPD: This is stable. She is on triple inhaler therapy. She is on oxygen. Today she needs a qualify oxygen saturation test. Her room a pulse ox is 88% on room air at rest. She is compliant with her inhalers. There are no new complaints.  Lung cancer surveillance: Last CT scan of the chest  was 2017 summer. She is interested in doing follow-up CT scan of the chest for recurrence surveillance this summer 2018  Past medical history: New issues that she had ventricular bigemini according to her history and was in the emergency department. She has follow-up with Dr. Skeet Latch upcoming she is asking if she can skip it because she has a grandson's graduation. She feels fine. Troponin checked in the ER was normal. This is based on my review of the chart.   OV 05/09/2017  Chief Complaint  Patient presents with  . Follow-up    Pt states that things have been a little worse since last visit. States that she is always SOB and has mild chest discomfort with exertion or if she is stressed and tired. Pt is wheezing today, 05/08/17.  DME: [facility], 2L O2      Gold stage IV advanced COPD patient and her lung cancer s/p XRT  COPD: In terms of COPD she is on triple inhaler therapy and oxygen.  Overall COPD stable but she has had some deterioration after recent breast cancer surgery and weather change.  She is having slight increase wheezing compared to baseline.  There is no colored sputum.  She is interested in a short prednisone burst for this.  New issue of worsening postnasal drip: She is complaining of worsening postnasal drip despite saline gel and also Flonase.  There is no discharge that is of discolored.  Lung cancer surveillance: She had CT scan of the chest summer 2018.  There was a new 4 mm nodule that requires follow-up in the future  New issue of preoperative clearance: She just  underwent a limited breast cancer surgery.  No radiation or chemo is planned.  She does have new right-sided retinal detachment and is seeing a Dr. Sherlynn Stalls.  When surgery is being contemplated for her she wants conversation with the ophthalmologist for this.    OV 08/10/2017  Chief Complaint  Patient presents with  . Follow-up    Pt states she has had more problems with breathing. States she is becoming SOB more often, occ. coughing, and when she cannot get her breath has chest tightness and feels like she is choking. DME: [facility],, 3L O2   Gold stage IV advanced COPD patient and lung cancer status post radiation  COPD: She is on triple inhaler therapy and oxygen.  She says she only uses 2 L of oxygen at rest 3 L with exertion.  She says in the last few days there is increased sinus congestion and sinus fullness.  She also feels she is desaturating easily but there is no change in sputum color or no increased cough.  She is willing to try a short course of prednisone.  COPD CAT score is 33 and similar to before.  Lung cancer surveillance: Last CT scan was summer 2018.  Next CT scan should be summer 2019.  Other issues: She is having increasing hard of hearing.  Also she says she no longer has a retinal detachment but has macular degeneration.  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  She is really upset at how her life is slowly going down.   CAT COPD Symptom & Quality of Life Score (GSK trademark) 0 is no burden. 5 is highest burden 05/09/2017  08/10/2017   Never Cough -> Cough all the time 3 3  No phlegm in chest -> Chest is full of phlegm 3 3  No chest tightness -> Chest feels very tight 3 4  No dyspnea for 1 flight stairs/hill -> Very dyspneic for 1 flight of stairs 5 4  No limitations for ADL at home -> Very limited with ADL at home 4 5  Confident leaving home -> Not at all confident leaving home 4 5  Sleep soundly -> Do not sleep  soundly because of lung condition 5 5  Lots of Energy -> No energy at all 5 5  TOTAL Score (max 40)  32 33   Results for LENETTA, PICHE (MRN [id number]) as of 08/10/2017 09:37  Ref. Range 04/11/2017 09:15  Creatinine Latest Ref Range: 0.44 - 1.00 mg/dL 0.49   Results for AIDA, LEMAIRE (MRN [id number]) as of 08/10/2017 09:37  Ref. Range 04/11/2017 09:15  Hemoglobin Latest Ref Range: 12.0 - 15.0 g/dL 13.4     has a past medical history of Arthritis, Asthma, Breast cancer, right breast ([place]), Chronic lower back pain, Colon adenomas, Complication of  anesthesia, COPD (chronic obstructive pulmonary disease) ([place]), Coronary artery calcification (09/01/2016), Dysrhythmia, GERD (gastroesophageal reflux disease), Goiter, History of duodenal ulcer, History of radiation therapy (05/24/11-06/02/11), colonic polyps, Hyperlipidemia, Lung cancer, middle lobe ([place]) (04/26/2011), On home oxygen therapy, Osteoporosis, Osteoporosis, Palpitations (01/19/2016), Pneumonia (2017 X 2), Pollen allergy, PVC (premature ventricular contraction) (02/24/2016), Shortness of breath, and Wears glasses.   reports that she quit smoking about 19 years ago. Her smoking use included cigarettes. She has a 20.00 pack-year smoking history. she has never used smokeless tobacco.  Past Surgical History:  Procedure Laterality Date  . APPENDECTOMY    . BIOPSY THYROID  2016  . BREAST BIOPSY Right 03/2017  . BREAST LUMPECTOMY WITH RADIOACTIVE SEED LOCALIZATION Right 04/11/2017  . BREAST LUMPECTOMY WITH RADIOACTIVE SEED LOCALIZATION Right 04/11/2017   Procedure: RIGHT BREAST LUMPECTOMY WITH RADIOACTIVE SEED LOCALIZATION;  Surgeon: Rolm Bookbinder, MD;  Location: [facility];  Service: General;  Laterality: Right;  . CATARACT EXTRACTION W/ INTRAOCULAR LENS  IMPLANT, BILATERAL Bilateral   . COLONOSCOPY W/ BIOPSIES    . DILATION AND CURETTAGE OF UTERUS  "many"  . TONSILLECTOMY    . TOTAL ABDOMINAL HYSTERECTOMY      Allergies  Allergen  Reactions  . Tiotropium Bromide Monohydrate Hives, Shortness Of Breath and Rash    Spiriva   . Codeine Other (See Comments)    Hyperactivity,crying  . Fosamax [Alendronate] Other (See Comments)    Muscle aches  . Asa [Aspirin] Other (See Comments)    Bleeding -ulcers  . Latex Rash    Immunization History  Administered Date(s) Administered  . Influenza Split 04/07/2013, 03/07/2014  . Influenza Whole 02/06/2011, 03/07/2012  . Influenza, High Dose Seasonal PF 03/07/2016, 02/05/2017  . Influenza,inj,Quad PF,6+ Mos 01/27/2015  . Influenza-Unspecified 01/05/2014  . Pneumococcal Conjugate-13 10/05/2013  . Pneumococcal Polysaccharide-23 06/08/2003  . Pneumococcal-Unspecified 11/05/2012    Family History  Problem Relation Age of Onset  . Emphysema Maternal Uncle   . Heart disease Mother   . Heart disease Maternal Grandfather   . Rheum arthritis Sister   . Cancer Sister   . Cancer Sister        throat cancer  . Brain cancer Sister   . Cancer Sister   . Ovarian cancer Sister   . Breast cancer Daughter      Current Outpatient Medications:  .  albuterol (PROVENTIL HFA;VENTOLIN HFA) 108 (90 Base) MCG/ACT inhaler, Inhale 2 puffs into the lungs every 6 (six) hours as needed for wheezing or shortness of breath., Disp: , Rfl:  .  BREO ELLIPTA 100-25 MCG/INH AEPB, INHALE ONE PUFF INTO THE LUNGS ONCE DAILY, Disp: 60 each, Rfl: 11 .  cetirizine (ZYRTEC) 10 MG tablet, Take 10 mg by mouth daily.  , Disp: , Rfl:  .  Cholecalciferol (VITAMIN D) 2000 UNITS CAPS, Take 2,000 Units by mouth 2 (two) times daily. , Disp: , Rfl:  .  fluticasone (FLONASE) 50 MCG/ACT nasal spray, Place 2 sprays into the nose daily as needed for allergies or rhinitis. , Disp: , Rfl:  .  INCRUSE ELLIPTA 62.5 MCG/INH AEPB, INHALE 1 PUFF BY MOUTH ONCE DAILY, Disp: 30 each, Rfl: 5 .  metoprolol tartrate (LOPRESSOR) 25 MG tablet, Take 0.5 tablets (12.5 mg total) by mouth 2 (two) times daily., Disp: 90 tablet, Rfl: 3 .   Omega-3 Fatty Acids (FISH OIL) 1000 MG CAPS, Take 1,000 mg by mouth at bedtime. , Disp: , Rfl:  .  SINGULAIR 10 MG tablet, Take 10 mg by mouth at bedtime. ,  Disp: , Rfl:     Review of Systems     Objective:   Physical Exam  Constitutional: She is oriented to person, place, and time. She appears well-nourished. No distress.  Elderly Frail O2 on  HENT:  Head: Normocephalic and atraumatic.  Right Ear: External ear normal.  Left Ear: External ear normal.  Mouth/Throat: Oropharynx is clear and moist. No oropharyngeal exudate.  Eyes: Conjunctivae and EOM are normal. Pupils are equal, round, and reactive to light. Right eye exhibits no discharge. Left eye exhibits no discharge. No scleral icterus.  Neck: Normal range of motion. Neck supple. No JVD present. No tracheal deviation present. No thyromegaly present.  Cardiovascular: Normal rate, regular rhythm, normal heart sounds and intact distal pulses. Exam reveals no gallop and no friction rub.  No murmur heard. Pulmonary/Chest: Effort normal and breath sounds normal. No respiratory distress. She has no wheezes. She has no rales. She exhibits no tenderness.  barrell chest Purse lip breathing  Abdominal: Soft. Bowel sounds are normal. She exhibits no distension and no mass. There is no tenderness. There is no rebound and no guarding.  Musculoskeletal: Normal range of motion. She exhibits no edema or tenderness.  Lymphadenopathy:    She has no cervical adenopathy.  Neurological: She is alert and oriented to person, place, and time. She has normal reflexes. No cranial nerve deficit. She exhibits normal muscle tone. Coordination normal.  Skin: Skin is warm and dry. No rash noted. She is not diaphoretic. No erythema. No pallor.  Psychiatric: Judgment and thought content normal.  plesant  Vitals reviewed.  Vitals:   08/10/17 0919  BP: 110/64  Pulse: 63  SpO2: 99%  Weight: 120 lb 12.8 oz (54.8 kg)  Height: 4\' 10"  (1.473 m)    Estimated  body mass index is 25.25 kg/m as calculated from the following:   Height as of this encounter: 4\' 10"  (1.473 m).   Weight as of this encounter: 120 lb 12.8 oz (54.8 kg).     Assessment:       ICD-10-CM   1. COPD exacerbation ([place]) J44.1   2. COPD, very severe ([place]) J44.9   3. Chronic respiratory failure with hypoxia (HCC) J96.11        Plan:     COPD exacerbation (HCC)   - might be in some flare up  - Please take prednisone 40 mg x1 day, then 30 mg x1 day, then 20 mg x1 day, then 10 mg x1 day, and then 5 mg x1 day and stop - if phlegm or drainage changes color, call us for antibiotic   COPD, very severe (HCC) Chronic respiratory failure with hypoxia ([place])   - continue o2 and inhalers  - check ABG and will call with results; if there is high CO2 then we can consider night BiPAP  Followup  3 months or sooner if needed    Dr. Brand Males, M.D., [facility].C.P Pulmonary and Critical Care Medicine Staff Physician, [place] Director - Interstitial Lung Disease  Program  Pulmonary [facility] at [place], [place], [address]  Pager: [phone], If no answer or between  15:00h - 7:00h: call [phone] Telephone: [phone]

## 2017-08-10 NOTE — Patient Instructions (Signed)
COPD exacerbation ([place])   - might be in some flare up  - Please take prednisone 40 mg x1 day, then 30 mg x1 day, then 20 mg x1 day, then 10 mg x1 day, and then 5 mg x1 day and stop - if phlegm or drainage changes color, call us for antibiotic   COPD, very severe (HCC) Chronic respiratory failure with hypoxia ([place])   - continue o2 and inhalers  - check ABG and will call with results; if there is high CO2 then we can consider night BiPAP  Followup  3 months or sooner if needed

## 2017-08-16 ENCOUNTER — Other Ambulatory Visit: Payer: Self-pay | Admitting: Internal Medicine

## 2017-08-17 IMAGING — MR MR THORACIC SPINE W/O CM
4 of 6 series · 13 of 48 positions shown · non-contrast
Comparison: Lumbar spine series 5551 [id number] and chest x-ray
05/10/2016

CLINICAL DATA: Evaluate thoracic compression fracture. Three weeks
of back pain.

EXAM:
MRI THORACIC SPINE WITHOUT CONTRAST
TECHNIQUE: Multiplanar, multisequence MR imaging of the thoracic spine was
performed. No intravenous contrast was administered.

[Series 4: T2 · sagittal · 4.0mm · 0.33mm/px · 4 of 12 slices shown (1 of 3)]
[im 1/12]
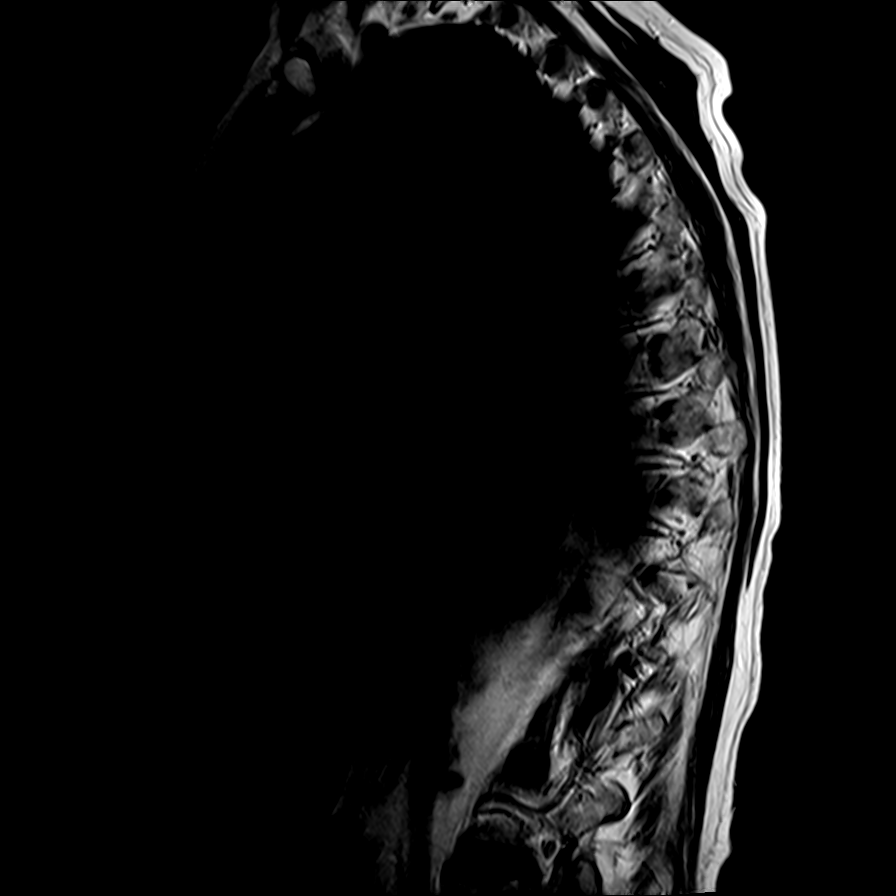
[im 3/12]
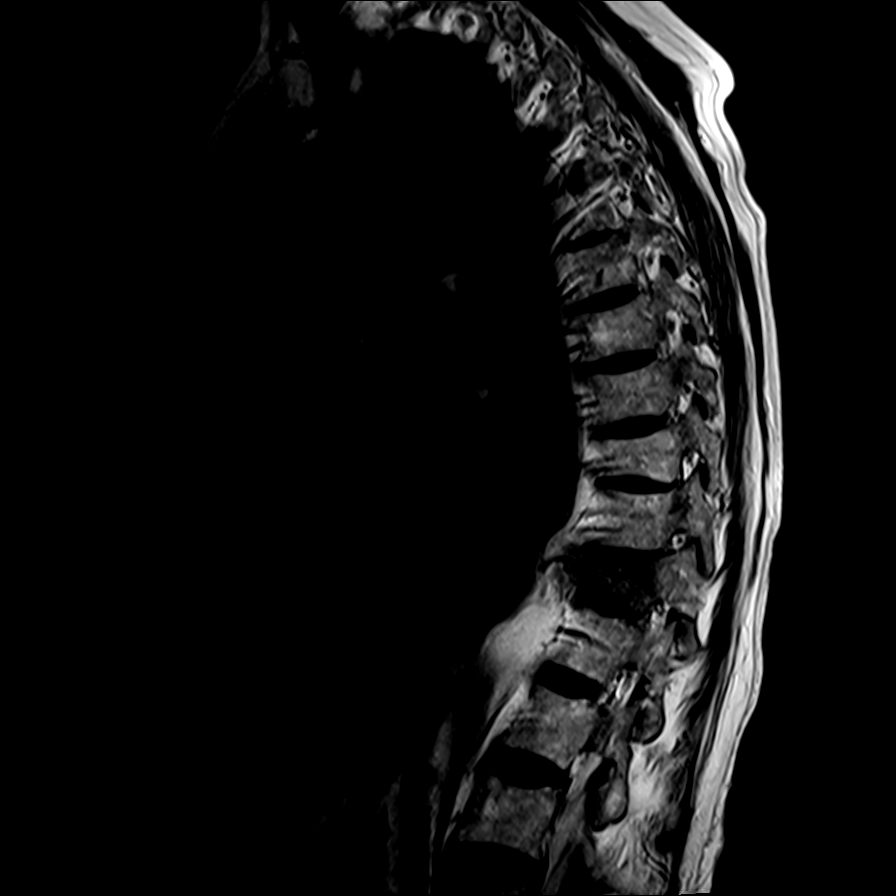
[im 6/12]
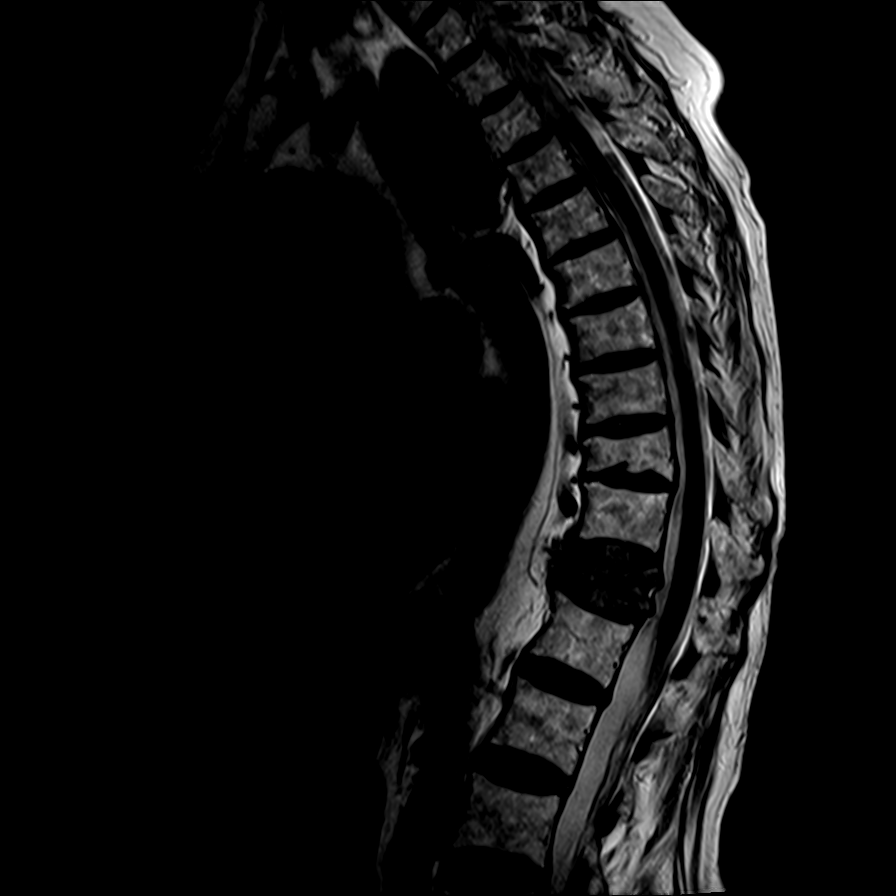
[im 12/12]
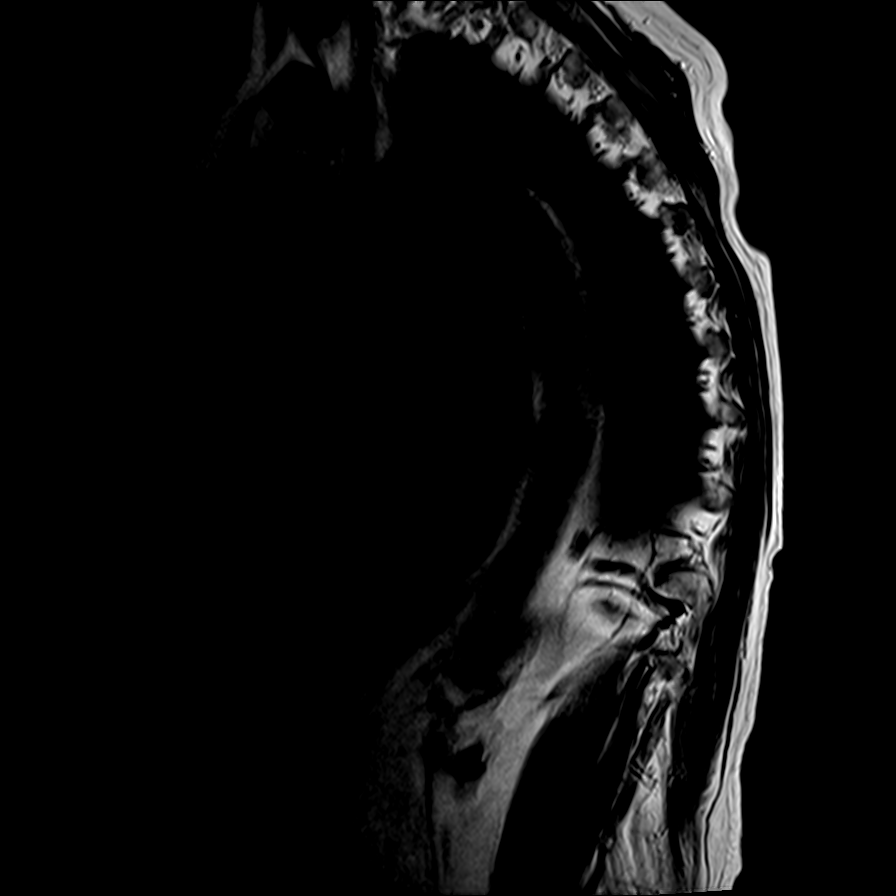

[Series 5: T1 · sagittal · 4.0mm · 0.67mm/px · 3 of 12 slices shown]
[im 1/12]
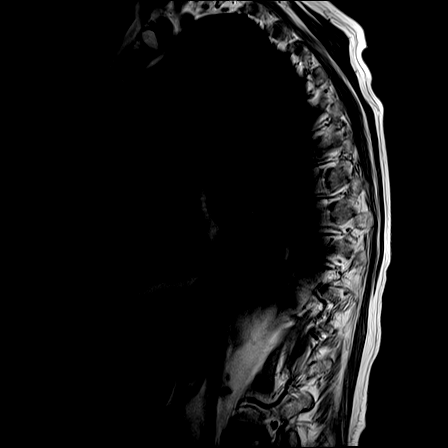
[im 6/12]
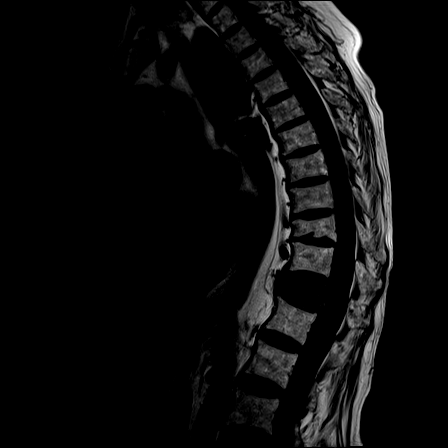
[im 12/12]
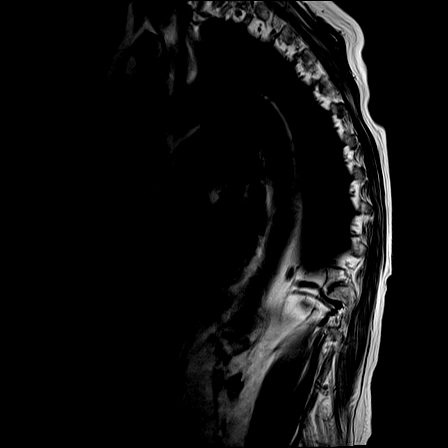

[Series 7: T2 · axial · 4.0mm · 0.39mm/px · z∈[-214,-113]mm · 3 of 34 slices shown (2 of 3)]
[im 5/34]
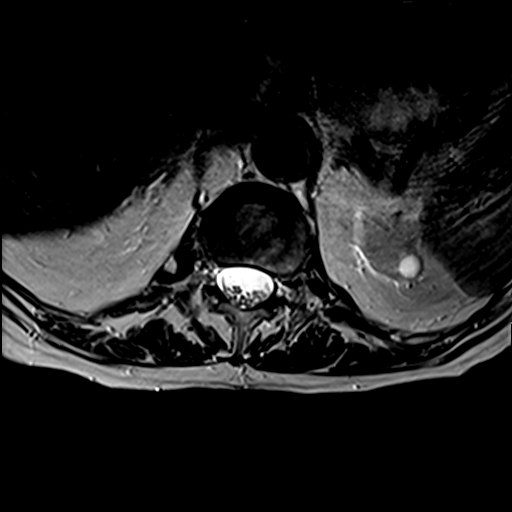
[im 17/34]
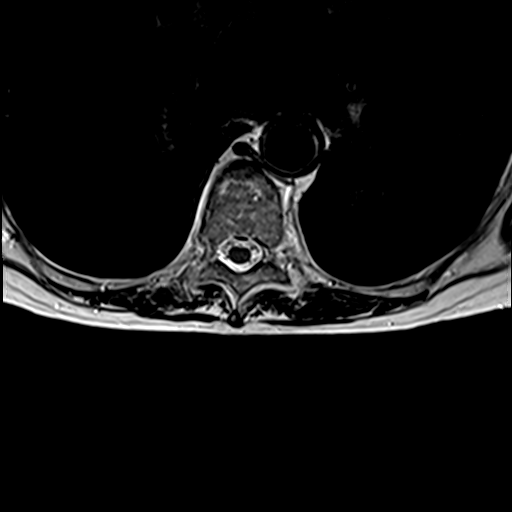
[im 29/34]
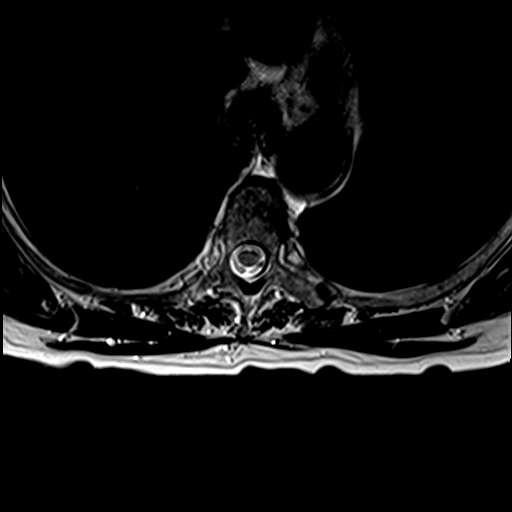

[Series 8: T2 · axial · 4.0mm · 0.39mm/px · z∈[-216,-109]mm · 3 of 34 slices shown (3 of 3)]
[im 5/34]
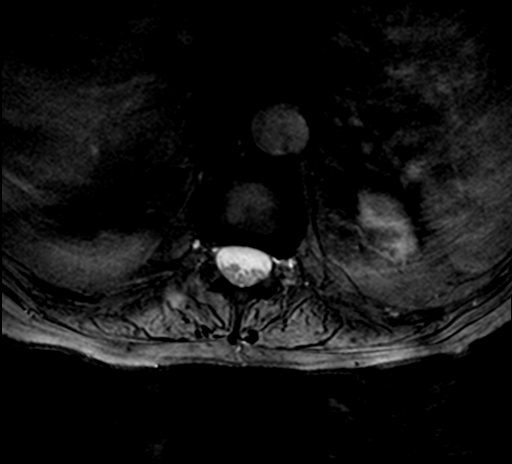
[im 17/34]
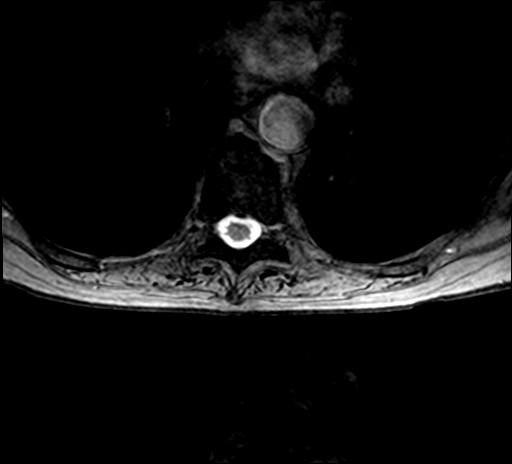
[im 29/34]
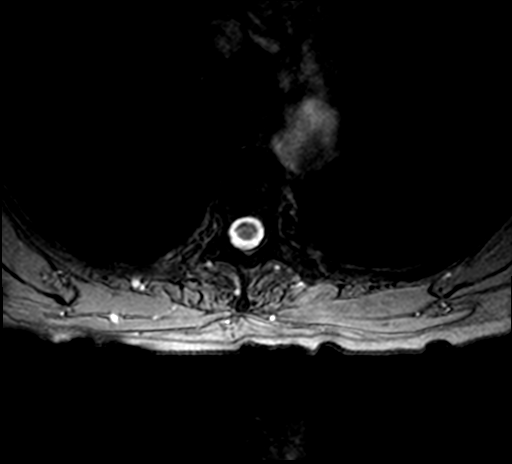

[13 of 48 positions shown; findings below may reference images not displayed]

FINDINGS: Alignment:  Normal

Vertebrae: Remote compression fracture of the T9 vertebral body.
Minimal retropulsion inferiorly. There is a acute or subacute
significant compression fracture T11. This is estimated at 60%. Mild
retropulsion and canal encroachment but no significant stenosis.

Cord: Normal signal intensity. No cord lesions or syrinx. The conus
medullaris terminates at T12.

Paraspinal and other soft tissues: No significant findings. There
are multiple renal cysts noted.

Disc levels:

No significant disc protrusions, spinal or foraminal stenosis.
Minimal/ mild retropulsion at T11 but no canal compromise.
IMPRESSION: 1. Significant T11 compression fracture estimated at 60%.
Minimal/mild retropulsion but no canal compromise. The pedicles
appear normal.
2. Remote compression fracture of T9.
3. No other significant bony findings.
4. Normal MR appearance of the thoracic spinal cord.

## 2017-08-30 ENCOUNTER — Ambulatory Visit (HOSPITAL_COMMUNITY)
Admission: RE | Admit: 2017-08-30 | Discharge: 2017-08-30 | Disposition: A | Discharge status: Home / Self Care | Payer: Medicare Other | Source: Ambulatory Visit | Attending: Internal Medicine | Admitting: Internal Medicine

## 2017-08-30 ENCOUNTER — Telehealth: Payer: Self-pay | Admitting: Internal Medicine

## 2017-08-30 DIAGNOSIS — J9611 Chronic respiratory failure with hypoxia: Secondary | ICD-10-CM | POA: Insufficient documentation

## 2017-08-30 DIAGNOSIS — J449 Chronic obstructive pulmonary disease, unspecified: Secondary | ICD-10-CM | POA: Diagnosis not present

## 2017-08-30 LAB — BLOOD GAS, ARTERIAL
Acid-Base Excess: 5.2 mmol/L — ABNORMAL HIGH (ref 0.0–2.0)
BICARBONATE: 31.1 mmol/L — AB (ref 20.0–28.0)
DRAWN BY: 257881
O2 CONTENT: 3 L/min
O2 SAT: 97.7 %
PATIENT TEMPERATURE: 98.6
pCO2 arterial: 53.3 mmHg — ABNORMAL HIGH (ref 32.0–48.0)
pH, Arterial: 7.383 (ref 7.350–7.450)
pO2, Arterial: 112 mmHg — ABNORMAL HIGH (ref 83.0–108.0)

## 2017-08-30 NOTE — Telephone Encounter (Signed)
Patient has been scheduled for TP on 4.2.19

## 2017-08-30 NOTE — Telephone Encounter (Signed)
Recent Labs  Lab 08/30/17 0907  PHART 7.383  PCO2ART 53.3*  PO2ART 112*  HCO3 31.1*  O2SAT 97.7     PCO2 hight > 52 and likely will benefit from Trilogy ventilator at night Give her appt to see an APP to get this started  Dr. Brand Males, M.D., [facility].C.P Pulmonary and Critical Care Medicine Staff Physician, [place] Director - Interstitial Lung Disease  Program  Pulmonary [facility] at [place], [place], [address]  Pager: [phone], If no answer or between  15:00h - 7:00h: call [phone] Telephone: [phone]

## 2017-09-06 ENCOUNTER — Encounter: Payer: Self-pay | Admitting: Adult Health

## 2017-09-06 ENCOUNTER — Ambulatory Visit (INDEPENDENT_AMBULATORY_CARE_PROVIDER_SITE_OTHER): Payer: Medicare Other | Admitting: Adult Health

## 2017-09-06 DIAGNOSIS — J9611 Chronic respiratory failure with hypoxia: Secondary | ICD-10-CM

## 2017-09-06 DIAGNOSIS — J449 Chronic obstructive pulmonary disease, unspecified: Secondary | ICD-10-CM

## 2017-09-06 NOTE — Assessment & Plan Note (Signed)
Hypercarbic and Hypoxic Respiratory Failure  Plan  Patient Instructions  Continue on BREO and INCRUSE daily , rinse after use .  Continue on Oxygen 3l/m  Order for POC .  Begin TRILOGY At bedtime  .  Follow up with Dr. Chase Caller in 2-3 months and As needed

## 2017-09-06 NOTE — Assessment & Plan Note (Signed)
Very severe COPD with frequent exacerbation with associated hypercarbic and hypoxic resp failure .   Plan  Patient Instructions  Continue on BREO and INCRUSE daily , rinse after use .  Continue on Oxygen 3l/m  Order for POC .  Begin TRILOGY At bedtime  .  Follow up with Dr. Chase Caller in 2-3 months and As needed

## 2017-09-06 NOTE — Patient Instructions (Addendum)
Continue on BREO and INCRUSE daily , rinse after use .  Continue on Oxygen 3l/m  Order for POC .  Begin TRILOGY At bedtime  .  Follow up with Dr. Chase Caller in 2-3 months and As needed

## 2017-09-06 NOTE — Addendum Note (Signed)
Addended by: Parke Poisson E on: 09/06/2017 04:38 PM   Modules accepted: Orders

## 2017-09-06 NOTE — Progress Notes (Signed)
@Patient  ID: Heather Barnes, female    DOB: 07-27-1933, 84 y.o.   MRN: [id number]  Chief Complaint  Patient presents with  . Follow-up    COPD     Referring provider: [name], MD  HPI: 84 year old female former smoker followed for gold IV  COPD, lung nodule with chronic hypoxic respiratory failure-on 3l/m .  Previous lung cancer status post XRT Previous breast cancer with resection   09/06/2017 Follow up: COPD , O2 RF  Patient presents for a one-month follow-up.  Last visit patient had a COPD exacerbation.  She was treated with a prednisone taper.  Patient is feeling better with less cough and dyspnea.  More back to baseline . Remains on BREO and INCRUSE .   Remains on oxygen 3l/m .  Has to use large tanks , hard for her to get around . Needs more lightweight system to be more active and independent. .  Walk test on 3l/m POC with o2 sats >90%.   Patient was set up for an ABG done last week that showed chronic hypercarbia with a pH of 7.3 and a PCO2 at 53, bicarb 31.  Patient's chronic respiratory failure due to  Very severe COPD  That  is life threatening.  Previous ABG's have documented high PCO2 and spirometry reveals very severe obstructive ventilatory defect.  Patient would benefit from non-invasive ventilation.  Without this therapy, the patient is at high risk of ending up with worsening symptoms, worsened respiratory failure, need for ER visits and/or recurrent hospitalizations.  Bilevel device unable to adequately support patient's nocturnal ventilation needs.  Patient would benefit from NIV therapy with set tidal volumes and pressure.    Allergies  Allergen Reactions  . Tiotropium Bromide Monohydrate Hives, Shortness Of Breath and Rash    Spiriva   . Codeine Other (See Comments)    Hyperactivity,crying  . Fosamax [Alendronate] Other (See Comments)    Muscle aches  . Asa [Aspirin] Other (See Comments)    Bleeding -ulcers  . Latex Rash    Immunization History   Administered Date(s) Administered  . Influenza Split 04/07/2013, 03/07/2014  . Influenza Whole 02/06/2011, 03/07/2012  . Influenza, High Dose Seasonal PF 03/07/2016, 02/05/2017  . Influenza,inj,Quad PF,6+ Mos 01/27/2015  . Influenza-Unspecified 01/05/2014  . Pneumococcal Conjugate-13 10/05/2013  . Pneumococcal Polysaccharide-23 06/08/2003  . Pneumococcal-Unspecified 11/05/2012    Past Medical History:  Diagnosis Date  . Arthritis    "joints" (04/11/2017)  . Asthma   . Breast cancer, right breast ([place])    S/P RIGHT BREAST LUMPECTOMY WITH RADIOACTIVE SEED LOCALIZATION 04/11/2017  . Chronic lower back pain    "if I stand too long" (04/11/2017)  . Colon adenomas   . Complication of anesthesia    Must sit upright 45-60 degree angle per pulmonology  . COPD (chronic obstructive pulmonary disease) ([place])    "severe" (04/11/2017)  . Coronary artery calcification 09/01/2016  . Dysrhythmia    bigeminy  . GERD (gastroesophageal reflux disease)   . Goiter   . History of duodenal ulcer   . History of radiation therapy 05/24/11-06/02/11   R middle lobe lung/ 60 gray  . Hx of colonic polyps   . Hyperlipidemia   . Lung cancer, middle lobe ([place]) 04/26/2011   S/P radiation 05/24/11-06/02/11  . On home oxygen therapy    "3L; all the time" (04/11/2017)  . Osteoporosis   . Osteoporosis   . Palpitations 01/19/2016   a. event monitor in 01/2016 showed PVC's with one  episode of ventricular quadrigeminy.  . Pneumonia 2017 X 2  . Pollen allergy   . PVC (premature ventricular contraction) 02/24/2016  . Shortness of breath   . Wears glasses     Tobacco History: Social History   Tobacco Use  Smoking Status Former Smoker  . Packs/day: 0.50  . Years: 40.00  . Pack years: 20.00  . Types: Cigarettes  . Last attempt to quit: 06/07/1998  . Years since quitting: 19.2  Smokeless Tobacco Never Used   Counseling given: Not Answered   Outpatient Encounter Medications as of 09/06/2017  Medication Sig    . albuterol (PROVENTIL HFA;VENTOLIN HFA) 108 (90 Base) MCG/ACT inhaler Inhale 2 puffs into the lungs every 6 (six) hours as needed for wheezing or shortness of breath.  [name] BREO ELLIPTA 100-25 MCG/INH AEPB INHALE ONE PUFF INTO THE LUNGS ONCE DAILY  . cetirizine (ZYRTEC) 10 MG tablet Take 10 mg by mouth daily.    . Cholecalciferol (VITAMIN D) 2000 UNITS CAPS Take 2,000 Units by mouth 2 (two) times daily.   . fluticasone (FLONASE) 50 MCG/ACT nasal spray Place 2 sprays into the nose daily as needed for allergies or rhinitis.   . INCRUSE ELLIPTA 62.5 MCG/INH AEPB INHALE 1 PUFF BY MOUTH ONCE DAILY  . metoprolol tartrate (LOPRESSOR) 25 MG tablet Take 0.5 tablets (12.5 mg total) by mouth 2 (two) times daily.  . Omega-3 Fatty Acids (FISH OIL) 1000 MG CAPS Take 1,000 mg by mouth at bedtime.   [name] SINGULAIR 10 MG tablet Take 10 mg by mouth at bedtime.   . [DISCONTINUED] predniSONE (DELTASONE) 10 MG tablet Take 1 tablet (10 mg total) by mouth daily with breakfast. 40mg  x1 day, then 30mg  x1 day, then 20mg x1 day, then 10mg  x1 day, then 5mg  x1 day , then STOP (Patient not taking: Reported on 09/06/2017)  . [DISCONTINUED] predniSONE (DELTASONE) 10 MG tablet 40mg  x1 day, then 30mg  x1 day, then 20mg x1 day, then 10mg  x1 day, then 5mg  x1 day , then STOP (Patient not taking: Reported on 09/06/2017)   No facility-administered encounter medications on file as of 09/06/2017.      Review of Systems  Constitutional:   No  weight loss, night sweats,  Fevers, chills,  +fatigue, or  lassitude.  HEENT:   No headaches,  Difficulty swallowing,  Tooth/dental problems, or  Sore throat,                No sneezing, itching, ear ache,  +nasal congestion, post nasal drip,   CV:  No chest pain,  Orthopnea, PND, swelling in lower extremities, anasarca, dizziness, palpitations, syncope.   GI  No heartburn, indigestion, abdominal pain, nausea, vomiting, diarrhea, change in bowel habits, loss of appetite, bloody stools.   Resp:    No  chest wall deformity  Skin: no rash or lesions.  GU: no dysuria, change in color of urine, no urgency or frequency.  No flank pain, no hematuria   MS:  No joint pain or swelling.  No decreased range of motion.  No back pain.    Physical Exam  BP 110/62 (BP Location: Left Arm, Cuff Size: Normal)   Pulse 62   Ht 4\' 10"  (1.473 m)   Wt 120 lb 3.2 oz (54.5 kg)   SpO2 100%   BMI 25.12 kg/m   GEN: A/Ox3; pleasant , NAD,eldelry , on o2    HEENT:  [place]/AT,  EACs-clear, TMs-wnl, NOSE-clear, THROAT-clear, no lesions, no postnasal drip or exudate noted.   NECK:  Supple  w/ fair ROM; no JVD; normal carotid impulses w/o bruits; no thyromegaly or nodules palpated; no lymphadenopathy.    RESP  Decreased BS in bases . no accessory muscle use, no dullness to percussion  CARD:  RRR, no m/r/g, no peripheral edema, pulses intact, no cyanosis or clubbing.  GI:   Soft & nt; nml bowel sounds; no organomegaly or masses detected.   Musco: Warm bil, no deformities or joint swelling noted.   Neuro: alert, no focal deficits noted.    Skin: Warm, no lesions or rashes    Lab Results:  CBC  BMET  BNP No results found for: BNP  ProBNP No results found for: PROBNP  Imaging: No results found.   Assessment & Plan:   COPD (chronic obstructive pulmonary disease) Very severe COPD with frequent exacerbation with associated hypercarbic and hypoxic resp failure .   Plan  Patient Instructions  Continue on BREO and INCRUSE daily , rinse after use .  Continue on Oxygen 3l/m  Order for POC .  Begin TRILOGY At bedtime  .  Follow up with Dr. Chase Caller in 2-3 months and As needed        Chronic respiratory failure with hypoxia Hypercarbic and Hypoxic Respiratory Failure  Plan  Patient Instructions  Continue on BREO and INCRUSE daily , rinse after use .  Continue on Oxygen 3l/m  Order for POC .  Begin TRILOGY At bedtime  .  Follow up with Dr. Chase Caller in 2-3 months and As needed            Rexene Edison, NP 09/06/2017

## 2017-09-15 DIAGNOSIS — D0511 Intraductal carcinoma in situ of right breast: Secondary | ICD-10-CM | POA: Diagnosis not present

## 2017-09-26 ENCOUNTER — Other Ambulatory Visit: Payer: Self-pay | Admitting: Pulmonary Disease

## 2017-09-26 DIAGNOSIS — D3132 Benign neoplasm of left choroid: Secondary | ICD-10-CM | POA: Diagnosis not present

## 2017-09-26 DIAGNOSIS — H353211 Exudative age-related macular degeneration, right eye, with active choroidal neovascularization: Secondary | ICD-10-CM | POA: Diagnosis not present

## 2017-09-26 DIAGNOSIS — H353122 Nonexudative age-related macular degeneration, left eye, intermediate dry stage: Secondary | ICD-10-CM | POA: Diagnosis not present

## 2017-09-26 DIAGNOSIS — H43813 Vitreous degeneration, bilateral: Secondary | ICD-10-CM | POA: Diagnosis not present

## 2017-10-04 DIAGNOSIS — E782 Mixed hyperlipidemia: Secondary | ICD-10-CM | POA: Diagnosis not present

## 2017-10-04 DIAGNOSIS — M810 Age-related osteoporosis without current pathological fracture: Secondary | ICD-10-CM | POA: Diagnosis not present

## 2017-10-19 DIAGNOSIS — C50911 Malignant neoplasm of unspecified site of right female breast: Secondary | ICD-10-CM | POA: Diagnosis not present

## 2017-10-19 DIAGNOSIS — J309 Allergic rhinitis, unspecified: Secondary | ICD-10-CM | POA: Diagnosis not present

## 2017-10-19 DIAGNOSIS — E785 Hyperlipidemia, unspecified: Secondary | ICD-10-CM | POA: Diagnosis not present

## 2017-10-19 DIAGNOSIS — H9193 Unspecified hearing loss, bilateral: Secondary | ICD-10-CM | POA: Diagnosis not present

## 2017-10-19 DIAGNOSIS — E782 Mixed hyperlipidemia: Secondary | ICD-10-CM | POA: Diagnosis not present

## 2017-10-19 DIAGNOSIS — J449 Chronic obstructive pulmonary disease, unspecified: Secondary | ICD-10-CM | POA: Diagnosis not present

## 2017-10-19 DIAGNOSIS — K219 Gastro-esophageal reflux disease without esophagitis: Secondary | ICD-10-CM | POA: Diagnosis not present

## 2017-10-19 DIAGNOSIS — I2510 Atherosclerotic heart disease of native coronary artery without angina pectoris: Secondary | ICD-10-CM | POA: Diagnosis not present

## 2017-10-19 DIAGNOSIS — R0689 Other abnormalities of breathing: Secondary | ICD-10-CM | POA: Diagnosis not present

## 2017-10-19 DIAGNOSIS — Z85118 Personal history of other malignant neoplasm of bronchus and lung: Secondary | ICD-10-CM | POA: Diagnosis not present

## 2017-10-19 DIAGNOSIS — M810 Age-related osteoporosis without current pathological fracture: Secondary | ICD-10-CM | POA: Diagnosis not present

## 2017-10-19 DIAGNOSIS — H35341 Macular cyst, hole, or pseudohole, right eye: Secondary | ICD-10-CM | POA: Diagnosis not present

## 2017-11-08 ENCOUNTER — Ambulatory Visit (INDEPENDENT_AMBULATORY_CARE_PROVIDER_SITE_OTHER): Payer: Medicare Other | Admitting: Internal Medicine

## 2017-11-08 ENCOUNTER — Encounter: Payer: Self-pay | Admitting: Internal Medicine

## 2017-11-08 VITALS — BP 120/60 | HR 62 | Ht <= 58 in | Wt 116.8 lb

## 2017-11-08 DIAGNOSIS — J9612 Chronic respiratory failure with hypercapnia: Secondary | ICD-10-CM

## 2017-11-08 DIAGNOSIS — J441 Chronic obstructive pulmonary disease with (acute) exacerbation: Secondary | ICD-10-CM

## 2017-11-08 DIAGNOSIS — J9611 Chronic respiratory failure with hypoxia: Secondary | ICD-10-CM | POA: Diagnosis not present

## 2017-11-08 DIAGNOSIS — J449 Chronic obstructive pulmonary disease, unspecified: Secondary | ICD-10-CM

## 2017-11-08 MED ORDER — PREDNISONE 10 MG PO TABS: ORAL_TABLET | ORAL | 0 refills | Status: DC

## 2017-11-08 NOTE — Progress Notes (Signed)
Subjective:     Patient ID: Heather Barnes, female   DOB: Jan 10, 1934, 84 y.o.   MRN: [id number]  HPI  OV 05/10/2016  Chief Complaint  Patient presents with  . Follow-up    Pt here for 62month f/u. Pt states she had pna in October but states she feel back to baseline. Pt c/o mild dry cough, PND. Pt denies CP/tightness.    Follow-up Gold stage IV COPD with chronic hypoxic respiratory failure  84 year old female. This is a follow-up from summer 2017. In October 2017 she was admitted for left upper lobe pneumonia. Chest x-ray was reviewed. She is back to stay on triple inhaler therapy. She continues on oxygen. Overall she's stable. There are no new issues with COPD standpoint. In the interim she's developed low back pain without any bladder or bowel disturbance. She's had 2 emergency department visits 04/24/2016 and 04/27/2016. The second one did a chest x-ray and she has T10 and T12 wedge compression fracture. This is severe and disabling her. She has an orthopedic appointment in a few days. She is taking appropriate pain medications.  OV 08/10/2016  Chief Complaint  Patient presents with  . Follow-up    f/u 3 months COPD, more SOB last couple of days, wheezing      Follow-up Gold stage IV COPD with chronic hypoxic respiratory failure  She is now 83. This is a routine follow-up. She is to follow-up back pain but it is better. Last visit December 2017. A follow-up chest x-ray for pneumonia and that showed improvement in infiltrates but she's not had a further follow-up of the chest x-ray. Now she is telling me for the last few days she's had increased sinus drainage, wheezing, chest tightness, cough that is worse than baseline. This no fever or edema or hemoptysis  OV 11/10/2016  Chief Complaint  Patient presents with  . Follow-up    Pt c/o slight increase in wheezing, increase in SOB, mild dry cough. Pt states this is due to the pollen. Pt denies CP/tightness and f/c/s.       Follow-up Gold stage IV COPD with chronic hypoxic respiratory failure Follow-up lung cancer surveillance   COPD: This is stable. She is on triple inhaler therapy. She is on oxygen. Today she needs a qualify oxygen saturation test. Her room a pulse ox is 88% on room air at rest. She is compliant with her inhalers. There are no new complaints.  Lung cancer surveillance: Last CT scan of the chest  was 2017 summer. She is interested in doing follow-up CT scan of the chest for recurrence surveillance this summer 2018  Past medical history: New issues that she had ventricular bigemini according to her history and was in the emergency department. She has follow-up with Dr. Skeet Latch upcoming she is asking if she can skip it because she has a grandson's graduation. She feels fine. Troponin checked in the ER was normal. This is based on my review of the chart.   OV 05/09/2017  Chief Complaint  Patient presents with  . Follow-up    Pt states that things have been a little worse since last visit. States that she is always SOB and has mild chest discomfort with exertion or if she is stressed and tired. Pt is wheezing today, 05/08/17.  DME: [facility], 2L O2      Gold stage IV advanced COPD patient and her lung cancer s/p XRT  COPD: In terms of COPD she is on triple inhaler therapy and oxygen.  Overall COPD stable but she has had some deterioration after recent breast cancer surgery and weather change.  She is having slight increase wheezing compared to baseline.  There is no colored sputum.  She is interested in a short prednisone burst for this.  New issue of worsening postnasal drip: She is complaining of worsening postnasal drip despite saline gel and also Flonase.  There is no discharge that is of discolored.  Lung cancer surveillance: She had CT scan of the chest summer 2018.  There was a new 4 mm nodule that requires follow-up in the future  New issue of preoperative clearance: She just  underwent a limited breast cancer surgery.  No radiation or chemo is planned.  She does have new right-sided retinal detachment and is seeing a Dr. Sherlynn Stalls.  When surgery is being contemplated for her she wants conversation with the ophthalmologist for this.    OV 08/10/2017  Chief Complaint  Patient presents with  . Follow-up    Pt states she has had more problems with breathing. States she is becoming SOB more often, occ. coughing, and when she cannot get her breath has chest tightness and feels like she is choking. DME: [facility],, 3L O2   Gold stage IV advanced COPD patient and lung cancer status post radiation  COPD: She is on triple inhaler therapy and oxygen.  She says she only uses 2 L of oxygen at rest 3 L with exertion.  She says in the last few days there is increased sinus congestion and sinus fullness.  She also feels she is desaturating easily but there is no change in sputum color or no increased cough.  She is willing to try a short course of prednisone.  COPD CAT score is 33 and similar to before.  Lung cancer surveillance: Last CT scan was summer 2018.  Next CT scan should be summer 2019.  Other issues: She is having increasing hard of hearing.  Also she says she no longer has a retinal detachment but has macular degeneration.  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  She is really upset at how her life is slowly going down.   09/06/2017 Follow up: COPD , O2 RF  Patient presents for a one-month follow-up.  Last visit patient had a COPD exacerbation.  She was treated with a prednisone taper.  Patient is feeling better with less cough and dyspnea.  More back to baseline . Remains on BREO and INCRUSE .   Remains on oxygen 3l/m .  Has to use large tanks , hard for her to get around . Needs more lightweight system to be more active and independent. .  Walk test on 3l/m POC with o2 sats >90%.   Patient was set up for an ABG done  last week that showed chronic hypercarbia with a pH of 7.3 and a PCO2 at 53, bicarb 31.  Patient's chronic respiratory failure due to  Very severe COPD  That  is life threatening.  Previous ABG's have documented high PCO2 and spirometry reveals very severe obstructive ventilatory defect.  Patient would benefit from non-invasive ventilation.  Without this therapy, the patient is at high risk of ending up with worsening symptoms, worsened respiratory failure, need for ER visits and/or recurrent hospitalizations.  Bilevel device unable to adequately support patient's nocturnal ventilation needs.  Patient would benefit from NIV therapy with set tidal volumes and pressure.    OV 11/08/2017  Chief Complaint  Patient presents with  . Follow-up    Pt states she has been having some trouble with the hot weather, has an occ. cough with occ clear mucus and also has a lot of drainage.   Indea Dearman [place] , 84 y.o. , with dob Sep 17, 1933 and female ,Not Hispanic or Latino from [address] [place] [address] - presents to pulm clinic for advanced copd followup.  Last visit was with nurse practitioner 2 months ago.  At that time because of hypercarbia noninvasive ventilation was recommended and order started.  However, up until this point she has not gotten her noninvasive ventilation at night.  She just continues with Incruse and Breo and daily oxygen therapy.  She finds herself desaturating easily sometimes to the 70s.  She tries to get her pulse ox to greater than 92%.  She does start feeling symptomatic at 84% or so.  I have advised her that goal pulse ox would only be greater than or equal to 88%.  She is compliant with her treatment regimen.  She is expressing goals of care to live long.  She wants to see her grandson get married.  She has lung cancer surveillance CT probably with the oncologist coming up this summer.  The only new issue is that because of the heat she is feeling symptoms of heavier  shortness of breath and chest tightness more than usual.  She feels she will benefit from a short course of prednisone.  There is no sputum production or fever or edema orthopnea proximal nocturnal dyspnea.     CAT COPD Symptom & Quality of Life Score (GSK trademark) 0 is no burden. 5 is highest burden 05/09/2017  08/10/2017   Never Cough -> Cough all the time 3 3  No phlegm in chest -> Chest is full of phlegm 3 3  No chest tightness -> Chest feels very tight 3 4  No dyspnea for 1 flight stairs/hill -> Very dyspneic for 1 flight of stairs 5 4  No limitations for ADL at home -> Very limited with ADL at home 4 5  Confident leaving home -> Not at all confident leaving home 4 5  Sleep soundly -> Do not sleep soundly because of lung condition 5 5  Lots of Energy -> No energy at all 5 5  TOTAL Score (max 40)  32 33   Results for RAINEE, SWEATT (MRN [id number]) as of 08/10/2017 09:37  Ref. Range 04/11/2017 09:15  Creatinine Latest Ref Range: 0.44 - 1.00 mg/dL 0.49   Results for ILLENE, SWEETING (MRN [id number]) as of 08/10/2017 09:37  Ref. Range 04/11/2017 09:15  Hemoglobin Latest Ref Range: 12.0 - 15.0 g/dL 13.4     has a past medical history of Arthritis, Asthma, Breast cancer, right breast ([place]), Chronic lower back pain, Colon adenomas, Complication of anesthesia, COPD (chronic obstructive pulmonary disease) ([place]), Coronary artery calcification (09/01/2016), Dysrhythmia, GERD (gastroesophageal reflux disease), Goiter, History of duodenal ulcer, History of radiation therapy (05/24/11-06/02/11), colonic polyps, Hyperlipidemia, Lung cancer, middle lobe ([place]) (04/26/2011), On home oxygen therapy, Osteoporosis, Osteoporosis, Palpitations (01/19/2016), Pneumonia (2017 X 2), Pollen allergy, PVC (premature ventricular contraction) (02/24/2016), Shortness of breath, and Wears glasses.   reports that she quit smoking about 19 years ago. Her smoking use included cigarettes. She has a 20.00 pack-year  smoking history. She has never used smokeless tobacco.  Past Surgical History:  Procedure Laterality Date  . APPENDECTOMY    . BIOPSY THYROID  2016  .  BREAST BIOPSY Right 03/2017  . BREAST LUMPECTOMY WITH RADIOACTIVE SEED LOCALIZATION Right 04/11/2017  . BREAST LUMPECTOMY WITH RADIOACTIVE SEED LOCALIZATION Right 04/11/2017   Procedure: RIGHT BREAST LUMPECTOMY WITH RADIOACTIVE SEED LOCALIZATION;  Surgeon: Rolm Bookbinder, MD;  Location: [facility];  Service: General;  Laterality: Right;  . CATARACT EXTRACTION W/ INTRAOCULAR LENS  IMPLANT, BILATERAL Bilateral   . COLONOSCOPY W/ BIOPSIES    . DILATION AND CURETTAGE OF UTERUS  "many"  . TONSILLECTOMY    . TOTAL ABDOMINAL HYSTERECTOMY      Allergies  Allergen Reactions  . Tiotropium Bromide Monohydrate Hives, Shortness Of Breath and Rash    Spiriva   . Codeine Other (See Comments)    Hyperactivity,crying  . Fosamax [Alendronate] Other (See Comments)    Muscle aches  . Asa [Aspirin] Other (See Comments)    Bleeding -ulcers  . Latex Rash    Immunization History  Administered Date(s) Administered  . Influenza Split 04/07/2013, 03/07/2014  . Influenza Whole 02/06/2011, 03/07/2012  . Influenza, High Dose Seasonal PF 03/07/2016, 02/05/2017  . Influenza,inj,Quad PF,6+ Mos 01/27/2015  . Influenza-Unspecified 01/05/2014  . Pneumococcal Conjugate-13 10/05/2013  . Pneumococcal Polysaccharide-23 06/08/2003  . Pneumococcal-Unspecified 11/05/2012    Family History  Problem Relation Age of Onset  . Emphysema Maternal Uncle   . Heart disease Mother   . Heart disease Maternal Grandfather   . Rheum arthritis Sister   . Cancer Sister   . Cancer Sister        throat cancer  . Brain cancer Sister   . Cancer Sister   . Ovarian cancer Sister   . Breast cancer Daughter      Current Outpatient Medications:  .  acetaminophen (TYLENOL) 325 MG tablet, Take 650 mg by mouth every 4 (four) hours as needed., Disp: , Rfl:  .  albuterol (PROVENTIL  HFA;VENTOLIN HFA) 108 (90 Base) MCG/ACT inhaler, Inhale 2 puffs into the lungs every 6 (six) hours as needed for wheezing or shortness of breath., Disp: , Rfl:  .  BREO ELLIPTA 100-25 MCG/INH AEPB, INHALE 1 PUFF BY MOUTH ONCE DAILY, Disp: 60 each, Rfl: 11 .  Calcium Carbonate-Vitamin D 600-200 MG-UNIT TABS, Take 1 tablet by mouth daily., Disp: , Rfl:  .  cetirizine (ZYRTEC) 10 MG tablet, Take 10 mg by mouth daily.  , Disp: , Rfl:  .  fluticasone (FLONASE) 50 MCG/ACT nasal spray, Place 2 sprays into the nose daily as needed for allergies or rhinitis. , Disp: , Rfl:  .  INCRUSE ELLIPTA 62.5 MCG/INH AEPB, INHALE 1 PUFF BY MOUTH ONCE DAILY, Disp: 30 each, Rfl: 5 .  metoprolol tartrate (LOPRESSOR) 25 MG tablet, Take 0.5 tablets (12.5 mg total) by mouth 2 (two) times daily., Disp: 90 tablet, Rfl: 3 .  Multiple Vitamins-Minerals (PRESERVISION AREDS 2) CAPS, Take by mouth., Disp: , Rfl:  .  Omega-3 Fatty Acids (FISH OIL) 1000 MG CAPS, Take 1,000 mg by mouth at bedtime. , Disp: , Rfl:  .  OXYGEN, Inhale 3 L into the lungs., Disp: , Rfl:  .  rosuvastatin (CRESTOR) 10 MG tablet, Take 10 mg by mouth once a week., Disp: , Rfl:  .  SINGULAIR 10 MG tablet, Take 10 mg by mouth at bedtime. , Disp: , Rfl:   Review of Systems     Objective:   Physical Exam  Constitutional: She is oriented to person, place, and time. She appears well-developed and well-nourished. No distress.  HENT:  Head: Normocephalic and atraumatic.  Right Ear: External ear  normal.  Left Ear: External ear normal.  Mouth/Throat: Oropharynx is clear and moist. No oropharyngeal exudate.  Eyes: Pupils are equal, round, and reactive to light. Conjunctivae and EOM are normal. Right eye exhibits no discharge. Left eye exhibits no discharge. No scleral icterus.  Neck: Normal range of motion. Neck supple. No JVD present. No tracheal deviation present. No thyromegaly present.  Cardiovascular: Normal rate, regular rhythm, normal heart sounds and  intact distal pulses. Exam reveals no gallop and no friction rub.  No murmur heard. Pulmonary/Chest: Effort normal and breath sounds normal. No respiratory distress. She has no wheezes. She has no rales. She exhibits no tenderness.  Barrel chested, elderly, frail  Abdominal: Soft. Bowel sounds are normal. She exhibits no distension and no mass. There is no tenderness. There is no rebound and no guarding.  Musculoskeletal: Normal range of motion. She exhibits no edema or tenderness.  Lymphadenopathy:    She has no cervical adenopathy.  Neurological: She is alert and oriented to person, place, and time. She has normal reflexes. No cranial nerve deficit. She exhibits normal muscle tone. Coordination normal.  Skin: Skin is warm and dry. No rash noted. She is not diaphoretic. No erythema. No pallor.  Psychiatric: She has a normal mood and affect. Her behavior is normal. Judgment and thought content normal.  Vitals reviewed.  Vitals:   11/08/17 1005  BP: 120/60  Pulse: 62  SpO2: 96%  Weight: 116 lb 12.8 oz (53 kg)  Height: 4\' 10"  (1.473 m)    Estimated body mass index is 24.41 kg/m as calculated from the following:   Height as of this encounter: 4\' 10"  (1.473 m).   Weight as of this encounter: 116 lb 12.8 oz (53 kg).     Assessment:       ICD-10-CM   1. Chronic respiratory failure with hypoxia and hypercapnia (HCC) J96.11    J96.12   2. Stage 4 very severe COPD by GOLD classification ([place]) J44.9   3. COPD with acute exacerbation ([place]) J44.1         Plan:      Mild copd flare up due to heat Not sure why night NIV has not been started yet - Candi Leash  was set up for an ABG done on below date and the results showed showed chronic hypercarbia - Results for BETTYJANE, SHENOY (MRN [id number]) as of 11/08/2017 10:18  Ref. Range 08/30/2017 09:07  pH, Arterial Latest Ref Range: 7.350 - 7.450  7.383  pCO2 arterial Latest Ref Range: 32.0 - 48.0 mmHg 53.3 (H)  pO2, Arterial Latest  Ref Range: 83.0 - 108.0 mmHg 112 (H)    Cherylynn Liszewski has  chronic respiratory failure is due to  Very severe COPD  That  is life threatening.  Previous ABG's have documented high PCO2 and spirometry reveals very severe obstructive ventilatory defect c/w GOLD STAGE III/IV COPD.   Patient WILL benefit from non-invasive ventilation.  Without this therapy, the patient is at high risk of ending up with worsening symptoms, worsened respiratory failure, need for ER visits and/or recurrent hospitalizations.  Bilevel device unable to adequately support patient's nocturnal ventilation needs.  Patient would benefit from NIV therapy with set tidal volumes and pressure.    Plan Take prednisone 30 mg daily x 2 days, then 20mg  daily x 2 days, then 10mg  daily x 2 days, then 5mg  daily x 2 days and stop  Contineu breo and incruse and daily o2  Goal pulse ox is> =/  88% ( you do not need 92%)  CMA to investigate why night BiPAP /NiV has not been started yet  Followup 3 months with COPD CAT score at followup; if night ventilation not started next 30 days please call us     Dr. Brand Males, M.D., [facility].C.P Pulmonary and Critical Care Medicine Staff Physician, [place] Director - Interstitial Lung Disease  Program  Pulmonary [place] at [place], [place], [address]  Pager: [phone], If no answer or between  15:00h - 7:00h: call [phone] Telephone: [phone]

## 2017-11-08 NOTE — Patient Instructions (Addendum)
ICD-10-CM   1. Chronic respiratory failure with hypoxia and hypercapnia (HCC) J96.11    J96.12   2. Stage 4 very severe COPD by GOLD classification ([place]) J44.9   3. COPD with acute exacerbation (HCC) J44.1    Mild copd flare up due to heat Not sure why night NIV has not been started yet  Plan Take prednisone 30 mg daily x 2 days, then 20mg  daily x 2 days, then 10mg  daily x 2 days, then 5mg  daily x 2 days and stop  Contineu breo and incruse and daily o2  Goal pulse ox is> =/ 88% ( you do not need 92%)  CMA to investigate why night BiPAP /NiV has not been started yet  Followup 3 months with COPD CAT score at followup; if night ventilation not started next 30 days please call us

## 2017-11-09 ENCOUNTER — Telehealth: Payer: Self-pay | Admitting: Internal Medicine

## 2017-11-09 DIAGNOSIS — J9612 Chronic respiratory failure with hypercapnia: Principal | ICD-10-CM

## 2017-11-09 DIAGNOSIS — J9611 Chronic respiratory failure with hypoxia: Secondary | ICD-10-CM

## 2017-11-10 NOTE — Telephone Encounter (Addendum)
Pt is aware of below message and voiced her understanding.  ABG has been ordered. Nothing further is needed.

## 2017-11-10 NOTE — Telephone Encounter (Signed)
Called and spoke with [facility] from [facility] who is covering for [place] today. She states that the ABG that MR provided for the patient was from back in march and it will not be sufficient. They are needing one from the last 30 days.   MR please advise on this, thank you.

## 2017-11-10 NOTE — Telephone Encounter (Signed)
Please have [facility] come and do antoehr ABG at [place]. Please apologize for the inconvenience

## 2017-11-14 ENCOUNTER — Ambulatory Visit (HOSPITAL_COMMUNITY)
Admission: RE | Admit: 2017-11-14 | Discharge: 2017-11-14 | Disposition: A | Discharge status: Home / Self Care | Payer: Medicare Other | Source: Ambulatory Visit | Attending: Internal Medicine | Admitting: Internal Medicine

## 2017-11-14 DIAGNOSIS — J9611 Chronic respiratory failure with hypoxia: Secondary | ICD-10-CM | POA: Diagnosis not present

## 2017-11-14 DIAGNOSIS — J9612 Chronic respiratory failure with hypercapnia: Secondary | ICD-10-CM | POA: Diagnosis not present

## 2017-11-14 LAB — BLOOD GAS, ARTERIAL
ACID-BASE EXCESS: 4.8 mmol/L — AB (ref 0.0–2.0)
Acid-base deficit: 5.2 mmol/L — ABNORMAL HIGH (ref 0.0–2.0)
BICARBONATE: 29.6 mmol/L — AB (ref 20.0–28.0)
DRAWN BY: 270211
O2 CONTENT: 3 L/min
O2 Saturation: 98.1 %
PH ART: 7.423 (ref 7.350–7.450)
Patient temperature: 98.6
pCO2 arterial: 46.1 mmHg (ref 32.0–48.0)
pO2, Arterial: 115 mmHg — ABNORMAL HIGH (ref 83.0–108.0)

## 2017-11-16 ENCOUNTER — Telehealth: Payer: Self-pay | Admitting: Internal Medicine

## 2017-11-16 NOTE — Telephone Encounter (Signed)
  Yes  Please let Heather Barnes kinow that ABG improved past 2 months and there is no need for night bipap  Thanks  MR  Recent Labs  Lab 11/14/17 1040  PHART 7.423  PCO2ART 46.1  PO2ART 115*  HCO3 29.6*  O2SAT 98.1

## 2017-11-16 NOTE — Telephone Encounter (Signed)
Called and spoke with Melissa. She states that her PC o2 level was below the qualifying limit. Patients was 46.1 and it needs to be greater than or equal to 52. At this point based on that she does not qualify.   MR please advise on this. Thanks.

## 2017-11-16 NOTE — Telephone Encounter (Signed)
Called and spoke with patient, advised her of MR response. Patient verbalized understanding. Nothing further needed.

## 2017-11-29 ENCOUNTER — Encounter: Payer: Self-pay | Admitting: Cardiovascular Disease

## 2017-11-29 ENCOUNTER — Ambulatory Visit (INDEPENDENT_AMBULATORY_CARE_PROVIDER_SITE_OTHER): Payer: Medicare Other | Admitting: Cardiovascular Disease

## 2017-11-29 VITALS — BP 124/68 | HR 75 | Ht <= 58 in | Wt 116.0 lb

## 2017-11-29 DIAGNOSIS — I259 Chronic ischemic heart disease, unspecified: Secondary | ICD-10-CM

## 2017-11-29 DIAGNOSIS — I2510 Atherosclerotic heart disease of native coronary artery without angina pectoris: Secondary | ICD-10-CM | POA: Diagnosis not present

## 2017-11-29 DIAGNOSIS — J9611 Chronic respiratory failure with hypoxia: Secondary | ICD-10-CM

## 2017-11-29 DIAGNOSIS — E7800 Pure hypercholesterolemia, unspecified: Secondary | ICD-10-CM | POA: Diagnosis not present

## 2017-11-29 DIAGNOSIS — I493 Ventricular premature depolarization: Secondary | ICD-10-CM | POA: Diagnosis not present

## 2017-11-29 NOTE — Progress Notes (Addendum)
Cardiology Office Note   Date:  11/29/2017   ID:  Heather, Barnes 1934/02/12, MRN [id number]  PCP:  [name], MD  Cardiologist:   Skeet Latch, MD   No chief complaint on file.     History of Present Illness: Heather Barnes is a 84 y.o. female with asymptomatic coronary calcifications, PVCs, hyperlipidemia, severe COPD, breast cancer, and lung cancer in remission who presents for follow up.  She was initially evaluated 01/2015 due to coronary calcifications in the LM, LAD and RCA identified on cardiac CT.   Heather Barnes reported exertional dyspnea that had been getting progressively worse for years.  This was attributed to both her COPD and lung cancer and she declined stress testing.  Heather Barnes can't tolerate aspirin due to bleeding ulcers.  She was started on atorvastatin but developed myalgias.  She was hospitalized with pneumonia 06/2015 and again 03/2016.  Since then she has been struggling to recover and remains short of breath.  Heather Barnes previously reported frequent palpitations.  She was referred for a 7 day event monitor 01/2016 that showed occasional PVCs with an episode of ventricular quadrigeminy.  She notes that while wearing the monitor her oxygen levels were dropping with minimal exertion.  She also developed a compression fracture and continues to have back pain.  Heather Barnes was diagnosed with breast cancer on a routine mammogram 02/2017.  Biopsy 03/07/17 revealed ductal carcinoma in situ, high-grade, weakly ER positive, PR negative.  Heather Barnes was admitted for lumpectomy 04/12/17.  She spent the night due to her severe COPD.  She followed up with Dr. Anastasia Pall who typically would recommend radiation given her close margins.  However, given her underlying lung disease this was not pursued.  She has also been struggling with progressive macular degeneration.  Her breathing has been especially poor this summer 2/2 heat.  She saw Dr. Chase Caller in early June and received a  prednisone taper that initially helped but her breathing remains poor.  She struggles to walk very short distances.  She has no increased cough or sputum.  She also denies chest pain or pressure.  She has no lower extremity edema, orthopnea or PND.  She struggled with statins in the past but was started on rosuvastatin 10mg  qweek due to LDL 188.   Past Medical History:  Diagnosis Date  . Arthritis    "joints" (04/11/2017)  . Asthma   . Breast cancer, right breast ([place])    S/P RIGHT BREAST LUMPECTOMY WITH RADIOACTIVE SEED LOCALIZATION 04/11/2017  . Chronic lower back pain    "if I stand too long" (04/11/2017)  . Colon adenomas   . Complication of anesthesia    Must sit upright 45-60 degree angle per pulmonology  . COPD (chronic obstructive pulmonary disease) ([place])    "severe" (04/11/2017)  . Coronary artery calcification 09/01/2016  . Dysrhythmia    bigeminy  . GERD (gastroesophageal reflux disease)   . Goiter   . History of duodenal ulcer   . History of radiation therapy 05/24/11-06/02/11   R middle lobe lung/ 60 gray  . Hx of colonic polyps   . Hyperlipidemia   . Lung cancer, middle lobe ([place]) 04/26/2011   S/P radiation 05/24/11-06/02/11  . On home oxygen therapy    "3L; all the time" (04/11/2017)  . Osteoporosis   . Osteoporosis   . Palpitations 01/19/2016   a. event monitor in 01/2016 showed PVC's with one episode of ventricular quadrigeminy.  . Pneumonia 2017 X  2  . Pollen allergy   . PVC (premature ventricular contraction) 02/24/2016  . Shortness of breath   . Wears glasses     Past Surgical History:  Procedure Laterality Date  . APPENDECTOMY    . BIOPSY THYROID  2016  . BREAST BIOPSY Right 03/2017  . BREAST LUMPECTOMY WITH RADIOACTIVE SEED LOCALIZATION Right 04/11/2017  . BREAST LUMPECTOMY WITH RADIOACTIVE SEED LOCALIZATION Right 04/11/2017   Procedure: RIGHT BREAST LUMPECTOMY WITH RADIOACTIVE SEED LOCALIZATION;  Surgeon: Rolm Bookbinder, MD;  Location: [facility];   Service: General;  Laterality: Right;  . CATARACT EXTRACTION W/ INTRAOCULAR LENS  IMPLANT, BILATERAL Bilateral   . COLONOSCOPY W/ BIOPSIES    . DILATION AND CURETTAGE OF UTERUS  "many"  . TONSILLECTOMY    . TOTAL ABDOMINAL HYSTERECTOMY       Current Outpatient Medications  Medication Sig Dispense Refill  . acetaminophen (TYLENOL) 325 MG tablet Take 650 mg by mouth every 4 (four) hours as needed.    [name] albuterol (PROVENTIL HFA;VENTOLIN HFA) 108 (90 Base) MCG/ACT inhaler Inhale 2 puffs into the lungs every 6 (six) hours as needed for wheezing or shortness of breath.    [name] BREO ELLIPTA 100-25 MCG/INH AEPB INHALE 1 PUFF BY MOUTH ONCE DAILY 60 each 11  . Calcium Carbonate-Vitamin D 600-200 MG-UNIT TABS Take 1 tablet by mouth daily.    . cetirizine (ZYRTEC) 10 MG tablet Take 10 mg by mouth daily.      . fluticasone (FLONASE) 50 MCG/ACT nasal spray Place 2 sprays into the nose daily as needed for allergies or rhinitis.     . INCRUSE ELLIPTA 62.5 MCG/INH AEPB INHALE 1 PUFF BY MOUTH ONCE DAILY 30 each 5  . metoprolol tartrate (LOPRESSOR) 25 MG tablet Take 0.5 tablets (12.5 mg total) by mouth 2 (two) times daily. 90 tablet 3  . Multiple Vitamins-Minerals (PRESERVISION AREDS 2) CAPS Take by mouth.    . Omega-3 Fatty Acids (FISH OIL) 1000 MG CAPS Take 1,000 mg by mouth at bedtime.     . OXYGEN Inhale 3 L into the lungs.    . rosuvastatin (CRESTOR) 10 MG tablet Take 10 mg by mouth once a week.    [name] SINGULAIR 10 MG tablet Take 10 mg by mouth at bedtime.     . predniSONE (DELTASONE) 10 MG tablet Take 30mg x2days,20mg x2days,10mg x2days,5mg x2days,then stop (Patient not taking: Reported on 11/29/2017) 13 tablet 0   No current facility-administered medications for this visit.     Allergies:   Tiotropium bromide monohydrate; Codeine; Fosamax [alendronate]; Asa [aspirin]; and Latex    Social History:  The patient  reports that she quit smoking about 19 years ago. Her smoking use included cigarettes. She has a  20.00 pack-year smoking history. She has never used smokeless tobacco. She reports that she does not drink alcohol or use drugs.   Family History:  The patient's family history includes Brain cancer in her sister; Breast cancer in her daughter; Cancer in her sister, sister, and sister; Emphysema in her maternal uncle; Heart disease in her maternal grandfather and mother; Ovarian cancer in her sister; Rheum arthritis in her sister.    ROS:  Please see the history of present illness.   Otherwise, review of systems are positive for none.   All other systems are reviewed and negative.    PHYSICAL EXAM: VS:  BP 124/68   Pulse 75   Ht 4\' 10"  (1.473 m)   Wt 116 lb (52.6 kg)   SpO2 95%   BMI 24.24  kg/m  , BMI Body mass index is 24.24 kg/m. GENERAL:  Chronically ill-appearing.  Severe espiratory distress with walking.  Mild respiratory distress while sitting and talking.   HEENT: Pupils equal round and reactive, fundi not visualized, oral mucosa unremarkable NECK:  No jugular venous distention, waveform within normal limits, carotid upstroke brisk and symmetric, no bruits LUNGS: Poor air movement.  Diffuse expiratory wheezing. HEART:  RRR.  PMI not displaced or sustained,S1 and S2 within normal limits, no S3, no S4, no clicks, no rubs, no murmurs ABD:  Flat, positive bowel sounds normal in frequency in pitch, no bruits, no rebound, no guarding, no midline pulsatile mass, no hepatomegaly, no splenomegaly EXT:  2 plus pulses throughout, no edema, no cyanosis no clubbing SKIN:  No rashes no nodules NEURO:  Cranial nerves II through XII grossly intact, motor grossly intact throughout PSYCH:  Cognitively intact, oriented to person place and time   EKG:  EKG is ordered today. The ekg ordered 01/19/16 demonstrates sinus rhythm at 82 bpm.   09/01/16: Sinus rhythm.  Rate 78 bpm.   05/11/17: Sinus rhythm.  Rate 72 bpm. 11/29/17: Sinus rhythm.  Rate 75 bpm.    7 Day Event Monitor 01/21/16:  Quality:  Fair.  Baseline artifact. Predominant rhythm: sinus  Average heart rate: 77 bpm Max heart rate: 110 bpm Min heart rate: 64 bpm  Occasional PVCs, with an episode of ventricular quadrigeminy.   Chest CT 12/20/14: Atherosclerosis of the thoracic aorta, mediastinal great vessels, LM, LAD and RCA.  Recent Labs: 03/16/2017: ALT 19 04/11/2017: BUN 7; Creatinine, Ser 0.49; Hemoglobin 13.4; Platelets 199; Potassium 4.2; Sodium 138   03/16/17: Sodium 138, potassium 4.2, BUN 7, creatinine 0.49 AST 19, ALT 19 WBC 9, hemoglobin 13.4, hematocrit 42.6, platelets 199 Total cholesterol 270, triglycerides 103, HDL 74, LDL 175  Lipid Panel    Component Value Date/Time   CHOL 186 01/21/2016 1126   TRIG 97 01/21/2016 1126   HDL 86 01/21/2016 1126   CHOLHDL 2.2 01/21/2016 1126   VLDL 19 01/21/2016 1126   LDLCALC 81 01/21/2016 1126      Wt Readings from Last 3 Encounters:  11/29/17 116 lb (52.6 kg)  11/08/17 116 lb 12.8 oz (53 kg)  09/06/17 120 lb 3.2 oz (54.5 kg)      ASSESSMENT AND PLAN:  # COPD: # Chronic hypoxic respiratory failure: Ms.  Colello respiratory status continues to decline.  This also makes her very anxious.  Her oxygen saturation dropped to 78% walking to our office while on 3.5L.  I recommended that she increase to at least 4L with exertion.  We also discussed Palliative Care services, but she doesn't think she needs them at this time.  I suggested that she follow up with her PCP regarding options for managing her anxiety.  # Asymptomatic coronary calcifications:  # Hyperlipidemia:  Ms. Maxie Better remains asymptomatic.  Continue metoprolol.  She is unable to tolerate aspirin or atorvastatin.  Agree with weekly rosuvastatin.  Lipids will be repeated with her PCP.  Continue metoprolol.    # PVCs: Symptoms improved on metoprolol.   # Hypertension: Blood pressure well-controlled.  Continue metoprolol.   Current medicines are reviewed at length with the patient today.  The  patient does not have concerns regarding medicines.  The following changes have been made: none  Time spent: 40 minutes-Greater than 50% of this time was spent in counseling, explanation of diagnosis, planning of further management, and coordination of care.   Labs/ tests ordered  today include: none   Orders Placed This Encounter  Procedures  . EKG 12-Lead     Disposition:   FU with Dr. Jonelle Sidle C. [place] in 6 months.   Signed, Skeet Latch, MD  11/29/2017 11:29 AM    [facility]

## 2017-11-29 NOTE — Patient Instructions (Signed)
Your physician wants you to follow-up in: 6 months with Dr. Oval Linsey. You will receive a reminder letter in the mail two months in advance. If you don't receive a letter, please call our office to schedule the follow-up appointment.  Dr. Oval Linsey recommended increasing oxygen to 4L when walking/with exertion

## 2017-12-06 ENCOUNTER — Telehealth: Payer: Self-pay | Admitting: Cardiovascular Disease

## 2017-12-06 ENCOUNTER — Telehealth: Payer: Self-pay | Admitting: Internal Medicine

## 2017-12-06 NOTE — Telephone Encounter (Signed)
New Message  Pt states that she needs the info that was change about her oxygen on 6/25 at her appt faxed to [facility] because she is having some issues with machine. Please call

## 2017-12-06 NOTE — Telephone Encounter (Signed)
Sent last office visit to [facility] and Dr Chase Caller, patient aware

## 2017-12-06 NOTE — Telephone Encounter (Signed)
Called and spoke to [place] with [facility]. Per Corene Cornea, Dr. Oval Linsey at [facility] is recommending that patient's oxygen be at 4L. What we are unsure of is that if this is a continuous tank or the SimplyGo. If [facility] wants continuous Corene Cornea wanted [facility] to be aware because that means they would be stopping the Simply Go Mini and putting patient back on continuous all the time and patient would need to re-qualify for the Simply Go in the future if she wants to switch back.  Corene Cornea stated that is is going to call [facility] to clarify and wants to keep [place] in the loop of patient's oxygen status.    Routing to MR as FYI.

## 2017-12-07 ENCOUNTER — Telehealth: Payer: Self-pay | Admitting: Cardiovascular Disease

## 2017-12-07 NOTE — Telephone Encounter (Signed)
Spoke with Corene Cornea and patient has a "simply mini" portable oxygen unit which works a little differently and was wanting to know if that is what patient was wearing when she was seen last week. Explained I was not here and Dr Oval Linsey is out of the office this week. Corene Cornea is going to reach out to patiens pulmonologist to see about getting her seen to evaluate for any possible oxygen level changes

## 2017-12-07 NOTE — Telephone Encounter (Signed)
Corene Cornea, [facility], calling.  [facility] is recommending the patient come in to our office for evaluation of her O2 needs.  A new RX might be necessary but the patient needs to be seen first. States okay to see NP.  Please call Corene Cornea back to go over this.  [phone] ext [phone].

## 2017-12-07 NOTE — Telephone Encounter (Signed)
Left message to call back  

## 2017-12-07 NOTE — Telephone Encounter (Signed)
Called and spoke with patient regarding in need of requalifying walk for O2. Pt's liter flow was changed from 3 liter pulse to 4 liter cont' by [facility] When pt was at the [facility] pt had hard time walking from lobby to room on 3 liter pulse, was at 72% O2 in rm. Advised pt that per [facility] our office will need to re-evaluate her on her O2 liter flow. Advised pt that she needs to schedule an appt with our office as soon as possible. Pt advised she will call back to schedule an appt with NP next week. Pt verbalized understanding.  Called and spoke with Corene Cornea with [facility] at phone [phone] x [phone] Donata Clay that pt will be contacting us on scheduling appt with NP to requalify her liter flow use  Corene Cornea advised that new [facility] note will be needed, and a new order sent to [facility] is needed. Verbalized understanding, will follow up later with pt.  Routing message to Raquel Sarna to f/u with patient next week.

## 2017-12-07 NOTE — Telephone Encounter (Signed)
Need call back concerning pt's oxygen. Please call

## 2017-12-09 NOTE — Telephone Encounter (Signed)
Spoke with pt. She has been scheduled to see Aaron Edelman on 12/14/17 at [facility]. Nothing further was needed.

## 2017-12-12 ENCOUNTER — Encounter: Payer: Self-pay | Admitting: Primary Care

## 2017-12-12 ENCOUNTER — Ambulatory Visit (INDEPENDENT_AMBULATORY_CARE_PROVIDER_SITE_OTHER): Payer: Medicare Other | Admitting: Primary Care

## 2017-12-12 DIAGNOSIS — J449 Chronic obstructive pulmonary disease, unspecified: Secondary | ICD-10-CM

## 2017-12-12 DIAGNOSIS — J441 Chronic obstructive pulmonary disease with (acute) exacerbation: Secondary | ICD-10-CM | POA: Diagnosis not present

## 2017-12-12 DIAGNOSIS — J320 Chronic maxillary sinusitis: Secondary | ICD-10-CM

## 2017-12-12 DIAGNOSIS — J329 Chronic sinusitis, unspecified: Secondary | ICD-10-CM | POA: Insufficient documentation

## 2017-12-12 MED ORDER — PREDNISONE 10 MG PO TABS: ORAL_TABLET | ORAL | 0 refills | Status: DC

## 2017-12-12 NOTE — Assessment & Plan Note (Addendum)
-   Desat 85% on 4-5L with simply go mini oxygen tank - Check O2 sat at home with pulse oximetry. Only amb short distances with simply go tank 4L, take break to recover every 125ft - FU in 1 week after prednisone course, if amb O2 sat remains <88% on pulsating O2 then will need to change pt back to continuous oxygen tank

## 2017-12-12 NOTE — Patient Instructions (Addendum)
-   1 week follow up with Heather Mola, NP   - Ambulating O2 sat dropped to 85% on 4L/5L pulsating portable tank (O2 on continuous tank was 94%)  - Plan short course of prednisone for sinus congestion/COPD flare and follow-up with me in 1 week  -Even if you feel ok, your oxygen can drop while walking which can cause additional strain on your heart  - Continue flonase, zyrtec and singular. Continue BREO and incruse.

## 2017-12-12 NOTE — Assessment & Plan Note (Signed)
-   Mild flare COPD symptoms secondary to chronic sinus congestion. Tx prednisone course. FU in 1 week

## 2017-12-12 NOTE — Progress Notes (Signed)
@Patient  ID: Heather Barnes, female    DOB: [birth year], 84 y.o.   MRN: [id number]  Chief Complaint  Patient presents with  . Follow-up    requalify for oxygen, SOb, oxygen going down with exewrtion    Referring provider: [name], MD  HPI: 84 year old female, hx COPD IV, respiratory failure, lung cancer s/p XRT, breast cancer with resection.  Patient was [facility] by Dr. Oval Linsey [facility]. During the visit her ambulating 02 sat dropped to 78% on 3L simply go mini. Patient states that it was very hot/humid that day and she had to walk a far distance to her apt. She typically only leaves that house 5-6 times a months. Dr. Oval Linsey recommended patient increase her O2 to 4L.   Recent ABGs showed improvement over the last two months and patient was instructed by Dr. Chase Caller that she could stop using BiPap.   12/12/2017 Presents today to re-qualify for home O2. Complains of some chronic sinus congestion which can affect her breathing. Continues BREO, incruse, flonase, zyrtec and singulair. Prednisone usually helps her symptoms. Denies sob at rest or cough.     Recent Labs  Lab 11/14/17 1040  PHART 7.423  PCO2ART 46.1  PO2ART 115*  HCO3 29.6*  O2SAT 98.1    Allergies  Allergen Reactions  . Tiotropium Bromide Monohydrate Hives, Shortness Of Breath and Rash    Spiriva   . Codeine Other (See Comments)    Hyperactivity,crying  . Fosamax [Alendronate] Other (See Comments)    Muscle aches  . Asa [Aspirin] Other (See Comments)    Bleeding -ulcers  . Latex Rash    Immunization History  Administered Date(s) Administered  . Influenza Split 04/07/2013, 03/07/2014  . Influenza Whole 02/06/2011, 03/07/2012  . Influenza, High Dose Seasonal PF 03/07/2016, 02/05/2017  . Influenza,inj,Quad PF,6+ Mos 01/27/2015  . Influenza-Unspecified 01/05/2014  . Pneumococcal Conjugate-13 10/05/2013  . Pneumococcal Polysaccharide-23 06/08/2003  .  Pneumococcal-Unspecified 11/05/2012    Past Medical History:  Diagnosis Date  . Arthritis    "joints" (04/11/2017)  . Asthma   . Breast cancer, right breast ([place])    S/P RIGHT BREAST LUMPECTOMY WITH RADIOACTIVE SEED LOCALIZATION 04/11/2017  . Chronic lower back pain    "if I stand too long" (04/11/2017)  . Colon adenomas   . Complication of anesthesia    Must sit upright 45-60 degree angle per pulmonology  . COPD (chronic obstructive pulmonary disease) ([place])    "severe" (04/11/2017)  . Coronary artery calcification 09/01/2016  . Dysrhythmia    bigeminy  . GERD (gastroesophageal reflux disease)   . Goiter   . History of duodenal ulcer   . History of radiation therapy 05/24/11-06/02/11   R middle lobe lung/ 60 gray  . Hx of colonic polyps   . Hyperlipidemia   . Lung cancer, middle lobe ([place]) 04/26/2011   S/P radiation 05/24/11-06/02/11  . On home oxygen therapy    "3L; all the time" (04/11/2017)  . Osteoporosis   . Osteoporosis   . Palpitations 01/19/2016   a. event monitor in 01/2016 showed PVC's with one episode of ventricular quadrigeminy.  . Pneumonia 2017 X 2  . Pollen allergy   . PVC (premature ventricular contraction) 02/24/2016  . Shortness of breath   . Wears glasses     Tobacco History: Social History   Tobacco Use  Smoking Status Former Smoker  . Packs/day: 0.50  . Years: 40.00  . Pack years: 20.00  .  Types: Cigarettes  . Last attempt to quit: 06/07/1998  . Years since quitting: 19.5  Smokeless Tobacco Never Used   Counseling given: Not Answered   Outpatient Medications Prior to Visit  Medication Sig Dispense Refill  . acetaminophen (TYLENOL) 325 MG tablet Take 650 mg by mouth every 4 (four) hours as needed.    [name] albuterol (PROVENTIL HFA;VENTOLIN HFA) 108 (90 Base) MCG/ACT inhaler Inhale 2 puffs into the lungs every 6 (six) hours as needed for wheezing or shortness of breath.    [name] BREO ELLIPTA 100-25 MCG/INH AEPB INHALE 1 PUFF BY MOUTH ONCE DAILY 60 each  11  . Calcium Carbonate-Vitamin D 600-200 MG-UNIT TABS Take 1 tablet by mouth daily.    . cetirizine (ZYRTEC) 10 MG tablet Take 10 mg by mouth daily.      . fluticasone (FLONASE) 50 MCG/ACT nasal spray Place 2 sprays into the nose daily as needed for allergies or rhinitis.     . INCRUSE ELLIPTA 62.5 MCG/INH AEPB INHALE 1 PUFF BY MOUTH ONCE DAILY 30 each 5  . metoprolol tartrate (LOPRESSOR) 25 MG tablet Take 0.5 tablets (12.5 mg total) by mouth 2 (two) times daily. 90 tablet 3  . Multiple Vitamins-Minerals (PRESERVISION AREDS 2) CAPS Take by mouth.    . Omega-3 Fatty Acids (FISH OIL) 1000 MG CAPS Take 1,000 mg by mouth at bedtime.     . OXYGEN Inhale 3 L into the lungs.    . rosuvastatin (CRESTOR) 10 MG tablet Take 10 mg by mouth once a week.    [name] SINGULAIR 10 MG tablet Take 10 mg by mouth at bedtime.     . predniSONE (DELTASONE) 10 MG tablet Take 30mg x2days,20mg x2days,10mg x2days,5mg x2days,then stop 13 tablet 0   No facility-administered medications prior to visit.       Review of Systems  Review of Systems  Constitutional: Negative for chills and fever.  HENT: Positive for congestion and sinus pressure. Negative for sinus pain and sore throat.   Respiratory: Positive for wheezing. Negative for cough and shortness of breath.   Cardiovascular: Negative for chest pain.     Physical Exam  BP 138/62 (BP Location: Left Arm, Cuff Size: Normal)   Pulse 76   Ht 4\' 10"  (1.473 m)   Wt 116 lb 9.6 oz (52.9 kg)   SpO2 93%   BMI 24.37 kg/m  Physical Exam  Constitutional: She appears well-developed and well-nourished.  HENT:  Head: Normocephalic.  Nose: Right sinus exhibits maxillary sinus tenderness. Left sinus exhibits maxillary sinus tenderness.  Eyes: Pupils are equal, round, and reactive to light. EOM are normal.  Cardiovascular: Normal rate, regular rhythm and normal pulses.  No edema  Pulmonary/Chest: Accessory muscle usage present. No tachypnea. No respiratory distress. She has  wheezes in the left upper field.  Abdominal: Soft. Normal appearance.     Lab Results:  BNP No results found for: BNP  ProBNP No results found for: PROBNP  Imaging: No results found.   Assessment & Plan:   COPD (chronic obstructive pulmonary disease) - Desat 85% on 4-5L with simply go mini oxygen tank - FU in 1 week after prednisone course, if amb O2 sat remains <88% on pulsating O2 then will need to change pt back to continuous oxygen tank     COPD exacerbation (HCC) - Mild flare COPD symptoms secondary to chronic sinus congestion. Tx prednisone course. FU in 1 week   Sinusitis Ongoing for the last year; continues flonase, zyrtec and singulair.     Ree Shay  Volanda Napoleon, NP 12/12/2017

## 2017-12-12 NOTE — Assessment & Plan Note (Signed)
Ongoing for the last year; continues flonase, zyrtec and singulair.

## 2017-12-14 ENCOUNTER — Ambulatory Visit: Payer: Medicare Other | Admitting: Pulmonary Disease

## 2017-12-19 ENCOUNTER — Telehealth: Payer: Self-pay | Admitting: Internal Medicine

## 2017-12-19 DIAGNOSIS — E782 Mixed hyperlipidemia: Secondary | ICD-10-CM | POA: Diagnosis not present

## 2017-12-19 DIAGNOSIS — M810 Age-related osteoporosis without current pathological fracture: Secondary | ICD-10-CM | POA: Diagnosis not present

## 2017-12-19 NOTE — Telephone Encounter (Signed)
Called patient, unable to reach left message to give us a call back. 

## 2017-12-20 NOTE — Telephone Encounter (Signed)
Spoke with pt. States that she contacted her DME and they brought her a new concentrator this morning. Nothing further was needed.

## 2017-12-22 ENCOUNTER — Ambulatory Visit (INDEPENDENT_AMBULATORY_CARE_PROVIDER_SITE_OTHER): Payer: Medicare Other | Admitting: Primary Care

## 2017-12-22 ENCOUNTER — Encounter: Payer: Self-pay | Admitting: Primary Care

## 2017-12-22 DIAGNOSIS — J449 Chronic obstructive pulmonary disease, unspecified: Secondary | ICD-10-CM

## 2017-12-22 NOTE — Progress Notes (Signed)
@Patient  ID: Heather Barnes, female    DOB: [birth year], 84 y.o.   MRN: [id number]  Chief Complaint  Patient presents with  . Follow-up    States her breathing has been good. She uses 4L for exertion and 3L at rest pulsed.     Referring provider: [name], MD  HPI: 84 year old female. Hx severe COPD, resp failure, lung CA s/p XRT, breast CA with resection.    12/12/2017 Patient was [facility]. During the visit her ambulating 02 sat dropped to 78% on 3L simply go mini. Patient states that it was very hot/humid that day and she had to walk a far distance to her apt. She typically only leaves that house 5-6 times a months. Dr. Oval Linsey recommended patient increase her O2 to 4L. Recent ABGs showed improvement over the last two months and patient was instructed by Dr. Chase Caller that she could stop using BiPap. Presents today to re-qualify for home O2. Complains of some chronic sinus congestion which can affect her breathing. Continues BREO, incruse, flonase, zyrtec and singulair. Prednisone usually helps her symptoms. Denies sob at rest or cough.   12/22/2017 Patient returns today for 1 week fu visit. Seen 7/8 to re-qualify for home o2. She had been using simplygo mini when leaving the house and O2sats were noticed to be dropping into the high 70's-80's. At that time patient complained of some chronic sinusitis in which prednisone was prescribed. Patient was instructed to use continuous oxygen at home, needed new concentrator from DME company.    Feels well today, no acute complaints. Completed prednisone course 4 days ago. Denies nasal congestion, cough or sputum production. Continue Breo and incurse inhalers.    Allergies  Allergen Reactions  . Tiotropium Bromide Monohydrate Hives, Shortness Of Breath and Rash    Spiriva   . Codeine Other (See Comments)    Hyperactivity,crying  . Fosamax [Alendronate] Other (See Comments)    Muscle  aches  . Asa [Aspirin] Other (See Comments)    Bleeding -ulcers  . Latex Rash    Immunization History  Administered Date(s) Administered  . Influenza Split 04/07/2013, 03/07/2014  . Influenza Whole 02/06/2011, 03/07/2012  . Influenza, High Dose Seasonal PF 03/07/2016, 02/05/2017  . Influenza,inj,Quad PF,6+ Mos 01/27/2015  . Influenza-Unspecified 01/05/2014  . Pneumococcal Conjugate-13 10/05/2013  . Pneumococcal Polysaccharide-23 06/08/2003  . Pneumococcal-Unspecified 11/05/2012    Past Medical History:  Diagnosis Date  . Arthritis    "joints" (04/11/2017)  . Asthma   . Breast cancer, right breast ([place])    S/P RIGHT BREAST LUMPECTOMY WITH RADIOACTIVE SEED LOCALIZATION 04/11/2017  . Chronic lower back pain    "if I stand too long" (04/11/2017)  . Colon adenomas   . Complication of anesthesia    Must sit upright 45-60 degree angle per pulmonology  . COPD (chronic obstructive pulmonary disease) ([place])    "severe" (04/11/2017)  . Coronary artery calcification 09/01/2016  . Dysrhythmia    bigeminy  . GERD (gastroesophageal reflux disease)   . Goiter   . History of duodenal ulcer   . History of radiation therapy 05/24/11-06/02/11   R middle lobe lung/ 60 gray  . Hx of colonic polyps   . Hyperlipidemia   . Lung cancer, middle lobe ([place]) 04/26/2011   S/P radiation 05/24/11-06/02/11  . On home oxygen therapy    "3L; all the time" (04/11/2017)  . Osteoporosis   . Osteoporosis   . Palpitations  01/19/2016   a. event monitor in 01/2016 showed PVC's with one episode of ventricular quadrigeminy.  . Pneumonia 2017 X 2  . Pollen allergy   . PVC (premature ventricular contraction) 02/24/2016  . Shortness of breath   . Wears glasses     Tobacco History: Social History   Tobacco Use  Smoking Status Former Smoker  . Packs/day: 0.50  . Years: 40.00  . Pack years: 20.00  . Types: Cigarettes  . Last attempt to quit: 06/07/1998  . Years since quitting: 19.5  Smokeless Tobacco Never  Used   Counseling given: Not Answered   Outpatient Medications Prior to Visit  Medication Sig Dispense Refill  . acetaminophen (TYLENOL) 325 MG tablet Take 650 mg by mouth every 4 (four) hours as needed.    [name] albuterol (PROVENTIL HFA;VENTOLIN HFA) 108 (90 Base) MCG/ACT inhaler Inhale 2 puffs into the lungs every 6 (six) hours as needed for wheezing or shortness of breath.    [name] BREO ELLIPTA 100-25 MCG/INH AEPB INHALE 1 PUFF BY MOUTH ONCE DAILY 60 each 11  . Calcium Carbonate-Vitamin D 600-200 MG-UNIT TABS Take 1 tablet by mouth daily.    . cetirizine (ZYRTEC) 10 MG tablet Take 10 mg by mouth daily.      . fluticasone (FLONASE) 50 MCG/ACT nasal spray Place 2 sprays into the nose daily as needed for allergies or rhinitis.     . INCRUSE ELLIPTA 62.5 MCG/INH AEPB INHALE 1 PUFF BY MOUTH ONCE DAILY 30 each 5  . metoprolol tartrate (LOPRESSOR) 25 MG tablet Take 0.5 tablets (12.5 mg total) by mouth 2 (two) times daily. 90 tablet 3  . Multiple Vitamins-Minerals (PRESERVISION AREDS 2) CAPS Take by mouth.    . Omega-3 Fatty Acids (FISH OIL) 1000 MG CAPS Take 1,000 mg by mouth at bedtime.     . OXYGEN Inhale 3 L into the lungs.    . rosuvastatin (CRESTOR) 10 MG tablet Take 10 mg by mouth once a week.    [name] SINGULAIR 10 MG tablet Take 10 mg by mouth at bedtime.     . predniSONE (DELTASONE) 10 MG tablet Take 4 tabs po daily x 2 days; then 3 tabs for 2 days; then 2 tabs for 2 days; then 1 tab for 2 days (Patient not taking: Reported on 12/22/2017) 20 tablet 0   No facility-administered medications prior to visit.       Review of Systems  Review of Systems  Constitutional: Negative.   HENT: Negative.   Respiratory: Positive for wheezing.        Fatigue, sob with activity  Cardiovascular: Negative.   Neurological: Negative.      Physical Exam  BP 120/64   Pulse 71   SpO2 91%  Physical Exam  Constitutional: She is oriented to person, place, and time. She appears well-developed and  well-nourished.  Small frame, elderly woman. No distress at rest.   HENT:  Head: Normocephalic and atraumatic.  Eyes: Pupils are equal, round, and reactive to light. EOM are normal.  Neck: Normal range of motion. Neck supple.  Cardiovascular: Normal rate and regular rhythm.  No murmur heard. Pulmonary/Chest: She has wheezes.  Dyspneic with activity  Abdominal: Soft. Bowel sounds are normal. There is no tenderness.  Neurological: She is alert and oriented to person, place, and time.  Skin: Skin is warm and dry. No rash noted. No erythema.  Psychiatric: She has a normal mood and affect. Her behavior is normal. Judgment normal.     Lab  Results:  CBC    Component Value Date/Time   WBC 9.0 04/11/2017 0915   RBC 4.75 04/11/2017 0915   HGB 13.4 04/11/2017 0915   HGB 12.8 03/16/2017 0823   HCT 42.6 04/11/2017 0915   HCT 39.9 03/16/2017 0823   PLT 199 04/11/2017 0915   PLT 182 03/16/2017 0823   MCV 89.7 04/11/2017 0915   MCV 87.6 03/16/2017 0823   MCH 28.2 04/11/2017 0915   MCHC 31.5 04/11/2017 0915   RDW 14.2 04/11/2017 0915   RDW 14.5 03/16/2017 0823   LYMPHSABS 1.1 03/16/2017 0823   MONOABS 0.6 03/16/2017 0823   EOSABS 0.1 03/16/2017 0823   BASOSABS 0.0 03/16/2017 0823    BMET    Component Value Date/Time   NA 138 04/11/2017 0915   NA 143 03/16/2017 0823   K 4.2 04/11/2017 0915   K 4.6 03/16/2017 0823   CL 99 (L) 04/11/2017 0915   CO2 31 04/11/2017 0915   CO2 35 (H) 03/16/2017 0823   GLUCOSE 99 04/11/2017 0915   GLUCOSE 91 03/16/2017 0823   BUN 7 04/11/2017 0915   BUN 9.9 03/16/2017 0823   CREATININE 0.49 04/11/2017 0915   CREATININE 0.7 03/16/2017 0823   CALCIUM 9.6 04/11/2017 0915   CALCIUM 10.0 03/16/2017 0823   GFRNONAA >60 04/11/2017 0915   GFRAA >60 04/11/2017 0915    BNP No results found for: BNP  ProBNP No results found for: PROBNP  Imaging: No results found.   Assessment & Plan:   COPD (chronic obstructive pulmonary disease) - Stable;  continues Breo and Incruse - Amb 02 sat 92% 4L simplygo mini  - Enc regular breaks to recover when active  - Next regular schedule fu 9/5 with Dr. Illene Labrador, NP 12/22/2017

## 2017-12-22 NOTE — Assessment & Plan Note (Signed)
-   Stable; continues Breo and Incruse - Amb 02 sat 92% 4L simplygo mini  - Enc regular breaks to recover when active  - Next regular schedule fu 9/5 with Dr. Chase Caller

## 2017-12-22 NOTE — Patient Instructions (Addendum)
Use SimplyGo mini at 4L when ambulating   Take frequent breaks when doing any activity, take 2-3 minutes to recover   Focus on high protein diet, stay hydrated, avoid being outside in the heat for long. Use AC/Fan in house   Next apt on 9/5 @ 9am with Dr. Chase Caller

## 2017-12-26 DIAGNOSIS — H353211 Exudative age-related macular degeneration, right eye, with active choroidal neovascularization: Secondary | ICD-10-CM | POA: Diagnosis not present

## 2017-12-26 DIAGNOSIS — H43813 Vitreous degeneration, bilateral: Secondary | ICD-10-CM | POA: Diagnosis not present

## 2017-12-26 DIAGNOSIS — H353122 Nonexudative age-related macular degeneration, left eye, intermediate dry stage: Secondary | ICD-10-CM | POA: Diagnosis not present

## 2017-12-26 DIAGNOSIS — H43822 Vitreomacular adhesion, left eye: Secondary | ICD-10-CM | POA: Diagnosis not present

## 2018-01-02 ENCOUNTER — Ambulatory Visit (INDEPENDENT_AMBULATORY_CARE_PROVIDER_SITE_OTHER)
Admission: RE | Admit: 2018-01-02 | Discharge: 2018-01-02 | Disposition: A | Discharge status: Home / Self Care | Payer: Medicare Other | Source: Ambulatory Visit | Attending: Internal Medicine | Admitting: Internal Medicine

## 2018-01-02 DIAGNOSIS — R911 Solitary pulmonary nodule: Secondary | ICD-10-CM

## 2018-01-02 DIAGNOSIS — J439 Emphysema, unspecified: Secondary | ICD-10-CM | POA: Diagnosis not present

## 2018-01-18 ENCOUNTER — Other Ambulatory Visit: Payer: Self-pay | Admitting: Internal Medicine

## 2018-01-18 DIAGNOSIS — R911 Solitary pulmonary nodule: Secondary | ICD-10-CM

## 2018-02-09 ENCOUNTER — Ambulatory Visit (INDEPENDENT_AMBULATORY_CARE_PROVIDER_SITE_OTHER): Payer: Medicare Other | Admitting: Internal Medicine

## 2018-02-09 ENCOUNTER — Encounter: Payer: Self-pay | Admitting: Internal Medicine

## 2018-02-09 VITALS — BP 124/58 | HR 67 | Ht <= 58 in | Wt 116.2 lb

## 2018-02-09 DIAGNOSIS — J9612 Chronic respiratory failure with hypercapnia: Secondary | ICD-10-CM

## 2018-02-09 DIAGNOSIS — J449 Chronic obstructive pulmonary disease, unspecified: Secondary | ICD-10-CM

## 2018-02-09 DIAGNOSIS — R911 Solitary pulmonary nodule: Secondary | ICD-10-CM | POA: Diagnosis not present

## 2018-02-09 DIAGNOSIS — Z23 Encounter for immunization: Secondary | ICD-10-CM | POA: Diagnosis not present

## 2018-02-09 DIAGNOSIS — A689 Relapsing fever, unspecified: Secondary | ICD-10-CM

## 2018-02-09 DIAGNOSIS — I2510 Atherosclerotic heart disease of native coronary artery without angina pectoris: Secondary | ICD-10-CM

## 2018-02-09 DIAGNOSIS — J9611 Chronic respiratory failure with hypoxia: Secondary | ICD-10-CM | POA: Diagnosis not present

## 2018-02-09 MED ORDER — FLUTICASONE-UMECLIDIN-VILANT 100-62.5-25 MCG/INH IN AEPB: 1.0000 | INHALATION_SPRAY | Freq: Every day | RESPIRATORY_TRACT | 0 refills | Status: DC

## 2018-02-09 NOTE — Patient Instructions (Signed)
Solitary pulmonary nodule -nmew 43mm RUL - End July 2019   - high suspicion of lung cancer  - do PET scan end Oct 2019   Stage 4 very severe COPD by GOLD classification ([place]) Chronic respiratory failure with hypoxia and hypercapnia ([facility])   - stable with high symptom load and advanced disease  - change incruse/breo to trelegy - take 2 month samples - albuterol as needed - high dose flu shot 02/09/2018   Recurrent fever  - agree no workup but to keep an eye   Followup End OCt.early nov 2019 after PET Scan; CAT score at followup

## 2018-02-09 NOTE — Progress Notes (Signed)
OV 05/10/2016  Chief Complaint  Patient presents with  . Follow-up    Pt here for 62month f/u. Pt states she had pna in October but states she feel back to baseline. Pt c/o mild dry cough, PND. Pt denies CP/tightness.    Follow-up Gold stage IV COPD with chronic hypoxic respiratory failure  84 year old female. This is a follow-up from summer 2017. In October 2017 she was admitted for left upper lobe pneumonia. Chest x-ray was reviewed. She is back to stay on triple inhaler therapy. She continues on oxygen. Overall she's stable. There are no new issues with COPD standpoint. In the interim she's developed low back pain without any bladder or bowel disturbance. She's had 2 emergency department visits 04/24/2016 and 04/27/2016. The second one did a chest x-ray and she has T10 and T12 wedge compression fracture. This is severe and disabling her. She has an orthopedic appointment in a few days. She is taking appropriate pain medications.  OV 08/10/2016  Chief Complaint  Patient presents with  . Follow-up    f/u 3 months COPD, more SOB last couple of days, wheezing      Follow-up Gold stage IV COPD with chronic hypoxic respiratory failure  She is now 63. This is a routine follow-up. She is to follow-up back pain but it is better. Last visit December 2017. A follow-up chest x-ray for pneumonia and that showed improvement in infiltrates but she's not had a further follow-up of the chest x-ray. Now she is telling me for the last few days she's had increased sinus drainage, wheezing, chest tightness, cough that is worse than baseline. This no fever or edema or hemoptysis  OV 11/10/2016  Chief Complaint  Patient presents with  . Follow-up    Pt c/o slight increase in wheezing, increase in SOB, mild dry cough. Pt states this is due to the pollen. Pt denies CP/tightness and f/c/s.      Follow-up Gold stage IV COPD with chronic hypoxic respiratory failure Follow-up lung cancer  surveillance   COPD: This is stable. She is on triple inhaler therapy. She is on oxygen. Today she needs a qualify oxygen saturation test. Her room a pulse ox is 88% on room air at rest. She is compliant with her inhalers. There are no new complaints.  Lung cancer surveillance: Last CT scan of the chest  was 2017 summer. She is interested in doing follow-up CT scan of the chest for recurrence surveillance this summer 2018  Past medical history: New issues that she had ventricular bigemini according to her history and was in the emergency department. She has follow-up with Dr. Skeet Latch upcoming she is asking if she can skip it because she has a grandson's graduation. She feels fine. Troponin checked in the ER was normal. This is based on my review of the chart.   OV 05/09/2017  Chief Complaint  Patient presents with  . Follow-up    Pt states that things have been a little worse since last visit. States that she is always SOB and has mild chest discomfort with exertion or if she is stressed and tired. Pt is wheezing today, 05/08/17.  DME: [facility], 2L O2      Gold stage IV advanced COPD patient and her lung cancer s/p XRT  COPD: In terms of COPD she is on triple inhaler therapy and oxygen.  Overall COPD stable but she has had some deterioration after recent breast cancer surgery and weather change.  She  is having slight increase wheezing compared to baseline.  There is no colored sputum.  She is interested in a short prednisone burst for this.  New issue of worsening postnasal drip: She is complaining of worsening postnasal drip despite saline gel and also Flonase.  There is no discharge that is of discolored.  Lung cancer surveillance: She had CT scan of the chest summer 2018.  There was a new 4 mm nodule that requires follow-up in the future  New issue of preoperative clearance: She just underwent a limited breast cancer surgery.  No radiation or chemo is planned.  She does have new  right-sided retinal detachment and is seeing a Dr. Sherlynn Stalls.  When surgery is being contemplated for her she wants conversation with the ophthalmologist for this.    OV 08/10/2017  Chief Complaint  Patient presents with  . Follow-up    Pt states she has had more problems with breathing. States she is becoming SOB more often, occ. coughing, and when she cannot get her breath has chest tightness and feels like she is choking. DME: [facility],, 3L O2   Gold stage IV advanced COPD patient and lung cancer status post radiation  COPD: She is on triple inhaler therapy and oxygen.  She says she only uses 2 L of oxygen at rest 3 L with exertion.  She says in the last few days there is increased sinus congestion and sinus fullness.  She also feels she is desaturating easily but there is no change in sputum color or no increased cough.  She is willing to try a short course of prednisone.  COPD CAT score is 33 and similar to before.  Lung cancer surveillance: Last CT scan was summer 2018.  Next CT scan should be summer 2019.  Other issues: She is having increasing hard of hearing.  Also she says she no longer has a retinal detachment but has macular degeneration.  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  She is really upset at how her life is slowly going down.   09/06/2017 Follow up: COPD , O2 RF  Patient presents for a one-month follow-up.  Last visit patient had a COPD exacerbation.  She was treated with a prednisone taper.  Patient is feeling better with less cough and dyspnea.  More back to baseline . Remains on BREO and INCRUSE .   Remains on oxygen 3l/m .  Has to use large tanks , hard for her to get around . Needs more lightweight system to be more active and independent. .  Walk test on 3l/m POC with o2 sats >90%.   Patient was set up for an ABG done last week that showed chronic hypercarbia with a pH of 7.3 and a PCO2 at 53, bicarb 31.  Patient's  chronic respiratory failure due to  Very severe COPD  That  is life threatening.  Previous ABG's have documented high PCO2 and spirometry reveals very severe obstructive ventilatory defect.  Patient would benefit from non-invasive ventilation.  Without this therapy, the patient is at high risk of ending up with worsening symptoms, worsened respiratory failure, need for ER visits and/or recurrent hospitalizations.  Bilevel device unable to adequately support patient's nocturnal ventilation needs.  Patient would benefit from NIV therapy with set tidal volumes and pressure.    OV 11/08/2017  Chief Complaint  Patient presents with  . Follow-up    Pt states she has been having  some trouble with the hot weather, has an occ. cough with occ clear mucus and also has a lot of drainage.   Christyne Mccain Hunter , 84 y.o. , with dob [birth year] and female ,Not Hispanic or Latino from [address] [place] [address] - presents to pulm clinic for advanced copd followup.  Last visit was with nurse practitioner 2 months ago.  At that time because of hypercarbia noninvasive ventilation was recommended and order started.  However, up until this point she has not gotten her noninvasive ventilation at night.  She just continues with Incruse and Breo and daily oxygen therapy.  She finds herself desaturating easily sometimes to the 70s.  She tries to get her pulse ox to greater than 92%.  She does start feeling symptomatic at 84% or so.  I have advised her that goal pulse ox would only be greater than or equal to 88%.  She is compliant with her treatment regimen.  She is expressing goals of care to live long.  She wants to see her grandson get married.  She has lung cancer surveillance CT probably with the oncologist coming up this summer.  The only new issue is that because of the heat she is feeling symptoms of heavier shortness of breath and chest tightness more than usual.  She feels she will benefit from a short course of  prednisone.  There is no sputum production or fever or edema orthopnea proximal nocturnal dyspnea.      Results for FREDDA, CLARIDA (MRN [id number]) as of 08/10/2017 09:37  Ref. Range 04/11/2017 09:15  Creatinine Latest Ref Range: 0.44 - 1.00 mg/dL 0.49   Results for EBONY, YORIO (MRN [id number]) as of 08/10/2017 09:37  Ref. Range 04/11/2017 09:15  Hemoglobin Latest Ref Range: 12.0 - 15.0 g/dL 13.4     has a past medical history of Arthritis, Asthma, Br  12/12/2017 Patient was seen by Dr. Oval Linsey [facility] on [facility]. During the visit her ambulating 02 sat dropped to 78% on 3L simply go mini. Patient states that it was very hot/humid that day and she had to walk a far distance to her apt. She typically only leaves that house 5-6 times a months. Dr. Oval Linsey recommended patient increase her O2 to 4L. Recent ABGs showed improvement over the last two months and patient was instructed by Dr. Chase Caller that she could stop using BiPap. Presents today to re-qualify for home O2. Complains of some chronic sinus congestion which can affect her breathing. Continues BREO, incruse, flonase, zyrtec and singulair. Prednisone usually helps her symptoms. Denies sob at rest or cough.   12/22/2017 Patient returns today for 1 week fu visit. Seen 7/8 to re-qualify for home o2. She had been using simplygo mini when leaving the house and O2sats were noticed to be dropping into the high 70's-80's. At that time patient complained of some chronic sinusitis in which prednisone was prescribed. Patient was instructed to use continuous oxygen at home, needed new concentrator from DME company.    Feels well today, no acute complaints. Completed prednisone course 4 days ago. Denies nasal congestion, cough or sputum production. Continue Breo and incurse inhalers.   OV 02/09/2018  Subjective:  Patient ID: Candi Leash, female , DOB: 10/13/1933 , age 17 y.o. , MRN: [id number] , ADDRESS: [place] [address]   02/09/2018 -   Chief Complaint  Patient presents with  . Follow-up    CT performed 01/02/18.  Pt is still having problems with SOB with exertion and her sats dropping to the low 80's and is still coughing and having a lot of sinus problems due to the weather.     HPI Tae Robak Marszalek 84 y.o. -presents for follow-up. In terms of her advanced COPDcat score is 30 and she is using 3-4 L of oxygen. Overall she's stable. But she is frustrated by her high symptom load and her easy desaturations. She is on in cruise and [place]. She is willing to accept that she is end-stage and these will be her baseline symptoms. She is willing to switch her inhalers to trelegy to see if she can get an age on the symptoms. She is also willing to have a high dose flu shot today  New issue CT scan July 2019 shows new right upper lobe 9 mm nodule that is spiculated and highly worrisome for non-small cell lung cancer. She has previous history of radiation for lung cancer nodule and also breast cancer  New issue: She has recurrent fevers the last 1 month. She says her baseline is 96 Fahrenheit. Her fevers are 98.4 Fahrenheit. There is no other systemic manifestations other than some fatigue that is worse. At this point she does not want to have any workup for the fevers but she wants to just observe this.     ROS - per HPI    CAT COPD Symptom & Quality of Life Score (GSK trademark) 0 is no burden. 5 is highest burden 05/09/2017  08/10/2017  02/09/2018   Never Cough -> Cough all the time 3 3 3   No phlegm in chest -> Chest is full of phlegm 3 3 5   No chest tightness -> Chest feels very tight 3 4 3   No dyspnea for 1 flight stairs/hill -> Very dyspneic for 1 flight of stairs 5 4 5   No limitations for ADL at home -> Very limited with ADL at home 4 5 5   Confident leaving home -> Not at all confident leaving home 4 5 1   Sleep soundly -> Do not sleep soundly because of lung condition 5 5 3    Lots of Energy -> No energy at all 5 5 5   TOTAL Score (max 40)  32 33 30     has a past medical history of Arthritis, Asthma, Breast cancer, right breast ([facility]), Chronic lower back pain, Colon adenomas, Complication of anesthesia, COPD (chronic obstructive pulmonary disease) ([facility]), Coronary artery calcification (09/01/2016), Dysrhythmia, GERD (gastroesophageal reflux disease), Goiter, History of duodenal ulcer, History of radiation therapy (05/24/11-06/02/11), colonic polyps, Hyperlipidemia, Lung cancer, middle lobe ([facility]) (04/26/2011), On home oxygen therapy, Osteoporosis, Osteoporosis, Palpitations (01/19/2016), Pneumonia (2017 X 2), Pollen allergy, PVC (premature ventricular contraction) (02/24/2016), Shortness of breath, and Wears glasses.   reports that she quit smoking about 19 years ago. Her smoking use included cigarettes. She has a 20.00 pack-year smoking history. She has never used smokeless tobacco.  Past Surgical History:  Procedure Laterality Date  . APPENDECTOMY    . BIOPSY THYROID  2016  . BREAST BIOPSY Right 03/2017  . BREAST LUMPECTOMY WITH RADIOACTIVE SEED LOCALIZATION Right 04/11/2017  . BREAST LUMPECTOMY WITH RADIOACTIVE SEED LOCALIZATION Right 04/11/2017   Procedure: RIGHT BREAST LUMPECTOMY WITH RADIOACTIVE SEED LOCALIZATION;  Surgeon: Rolm Bookbinder, MD;  Location: [facility];  Service: General;  Laterality: Right;  . CATARACT EXTRACTION W/ INTRAOCULAR LENS  IMPLANT, BILATERAL Bilateral   . COLONOSCOPY W/ BIOPSIES    . DILATION AND CURETTAGE OF UTERUS  "many"  .  TONSILLECTOMY    . TOTAL ABDOMINAL HYSTERECTOMY      Allergies  Allergen Reactions  . Tiotropium Bromide Monohydrate Hives, Shortness Of Breath and Rash    Spiriva   . Codeine Other (See Comments)    Hyperactivity,crying  . Fosamax [Alendronate] Other (See Comments)    Muscle aches  . Asa [Aspirin] Other (See Comments)    Bleeding -ulcers  . Latex Rash    Immunization History  Administered Date(s)  Administered  . Influenza Split 04/07/2013, 03/07/2014  . Influenza Whole 02/06/2011, 03/07/2012  . Influenza, High Dose Seasonal PF 03/07/2016, 02/05/2017  . Influenza,inj,Quad PF,6+ Mos 01/27/2015  . Influenza-Unspecified 01/05/2014  . Pneumococcal Conjugate-13 10/05/2013  . Pneumococcal Polysaccharide-23 06/08/2003  . Pneumococcal-Unspecified 11/05/2012    Family History  Problem Relation Age of Onset  . Emphysema Maternal Uncle   . Heart disease Mother   . Heart disease Maternal Grandfather   . Rheum arthritis Sister   . Cancer Sister   . Cancer Sister        throat cancer  . Brain cancer Sister   . Cancer Sister   . Ovarian cancer Sister   . Breast cancer Daughter      Current Outpatient Medications:  .  acetaminophen (TYLENOL) 325 MG tablet, Take 650 mg by mouth every 4 (four) hours as needed., Disp: , Rfl:  .  albuterol (PROVENTIL HFA;VENTOLIN HFA) 108 (90 Base) MCG/ACT inhaler, Inhale 2 puffs into the lungs every 6 (six) hours as needed for wheezing or shortness of breath., Disp: , Rfl:  .  BREO ELLIPTA 100-25 MCG/INH AEPB, INHALE 1 PUFF BY MOUTH ONCE DAILY, Disp: 60 each, Rfl: 11 .  Calcium Carbonate-Vitamin D 600-200 MG-UNIT TABS, Take 1 tablet by mouth daily., Disp: , Rfl:  .  cetirizine (ZYRTEC) 10 MG tablet, Take 10 mg by mouth daily.  , Disp: , Rfl:  .  fluticasone (FLONASE) 50 MCG/ACT nasal spray, Place 2 sprays into the nose daily as needed for allergies or rhinitis. , Disp: , Rfl:  .  INCRUSE ELLIPTA 62.5 MCG/INH AEPB, INHALE 1 PUFF BY MOUTH ONCE DAILY, Disp: 30 each, Rfl: 5 .  metoprolol tartrate (LOPRESSOR) 25 MG tablet, Take 0.5 tablets (12.5 mg total) by mouth 2 (two) times daily., Disp: 90 tablet, Rfl: 3 .  Multiple Vitamins-Minerals (PRESERVISION AREDS 2) CAPS, Take by mouth., Disp: , Rfl:  .  Omega-3 Fatty Acids (FISH OIL) 1000 MG CAPS, Take 1,000 mg by mouth at bedtime. , Disp: , Rfl:  .  OXYGEN, Inhale into the lungs. 3L at rest and 4pulse with  exertion, Disp: , Rfl:  .  rosuvastatin (CRESTOR) 10 MG tablet, Take 10 mg by mouth every other day. , Disp: , Rfl:  .  SINGULAIR 10 MG tablet, Take 10 mg by mouth at bedtime. , Disp: , Rfl:       Objective:   Vitals:   02/09/18 0858  BP: (!) 124/58  Pulse: 67  SpO2: 94%  Weight: 116 lb 3.2 oz (52.7 kg)  Height: 4\' 10"  (1.473 m)    Estimated body mass index is 24.29 kg/m as calculated from the following:   Height as of this encounter: 4\' 10"  (1.473 m).   Weight as of this encounter: 116 lb 3.2 oz (52.7 kg).  @WEIGHTCHANGE @  [employer]   02/09/18 0858  Weight: 116 lb 3.2 oz (52.7 kg)     Physical Exam  General Appearance:    Alert, cooperative, no distress, appears stated age - yes ,  sitting on - chair  Head:    Normocephalic, without obvious abnormality, atraumatic  Eyes:    PERRL, conjunctiva/corneas clear,  Ears:    Normal TM's and external ear canals, both ears  Nose:   Nares normal, septum midline, mucosa normal, no drainage    or sinus tenderness. OXYGEN ON  - yes . Patient is @ 3L    Throat:   Lips, mucosa, and tongue normal; teeth and gums normal. Cyanosis on lips - no  Neck:   Supple, symmetrical, trachea midline, no adenopathy;    thyroid:  no enlargement/tenderness/nodules; no carotid   bruit or JVD  Back:     Symmetric, no curvature, ROM normal, no CVA tenderness  Lungs:     Distress - no , Wheeze no, Barrell Chest - yes, Purse lip breathing - yes, Crackles - no   Chest Wall:    No tenderness or deformity. Scars in chest no   Heart:    Regular rate and rhythm, S1 and S2 normal, no murmur, rub   or gallop  Breast Exam:    NOT DONE  Abdomen:     Soft, non-tender, bowel sounds active all four quadrants,    no masses, no organomegaly  Genitalia:   NOT DONE  Rectal:   NOT DONE  Extremities:   Extremities normal, atraumatic, Clubbing - yes, Edema - no  Pulses:   2+ and symmetric all extremities  Skin:   Stigmata of Connective Tissue Disease - no  Lymph  nodes:   Cervical, supraclavicular, and axillary nodes normal  Psychiatric:  Neurologic:   plesant CNII-XII intact, normal strength, sensation  throughout           Assessment:       ICD-10-CM   1. Solitary pulmonary nodule R91.1   2. Stage 4 very severe COPD by GOLD classification ([facility]) J44.9   3. Chronic respiratory failure with hypoxia and hypercapnia (HCC) J96.11    J96.12   4. Recurrent fever A68.9        Plan:     *Solitary pulmonary nodule -nmew 70mm RUL - End July 2019   - high suspicion of lung cancer  - do PET scan end Oct 2019   Stage 4 very severe COPD by GOLD classification ([facility]) Chronic respiratory failure with hypoxia and hypercapnia (HCC)   - stable with high symptom load and advanced disease  - change incruse/breo to trelegy - take 2 month samples - albuterol as needed - high dose flu shot 02/09/2018   Recurrent fever  - agree no workup but to keep an eye   Followup End OCt.early nov 2019 after PET Scan; CAT score at followup          SIGNATURE    Dr. Brand Males, M.D., F.C.C.P,  Pulmonary and Critical Care Medicine Staff Physician, [place] Director - Interstitial Lung Disease  Program  P[facility] [place], [place], [address]  Pager: [phone], If no answer or between  15:00h - 7:00h: call [phone] Telephone: [phone]  9:28 AM 02/09/2018

## 2018-02-20 ENCOUNTER — Ambulatory Visit: Payer: Medicare Other | Admitting: Oncology

## 2018-02-22 ENCOUNTER — Ambulatory Visit: Payer: Medicare Other | Admitting: Oncology

## 2018-02-24 ENCOUNTER — Telehealth: Payer: Self-pay | Admitting: Internal Medicine

## 2018-02-24 MED ORDER — FLUTICASONE-UMECLIDIN-VILANT 100-62.5-25 MCG/INH IN AEPB: 1.0000 | INHALATION_SPRAY | Freq: Every day | RESPIRATORY_TRACT | 5 refills | Status: DC

## 2018-02-24 NOTE — Telephone Encounter (Signed)
Stage 4 very severe COPD by GOLD classification ([facility]) Chronic respiratory failure with hypoxia and hypercapnia (HCC)   - stable with high symptom load and advanced disease  - change incruse/breo to trelegy - take 2 month samples - albuterol as needed - high dose flu shot 02/09/2018   Rx has been sent to pharmacy. Pt states inhaler works better for her.

## 2018-03-27 ENCOUNTER — Encounter (HOSPITAL_COMMUNITY)
Admission: RE | Admit: 2018-03-27 | Discharge: 2018-03-27 | Disposition: A | Discharge status: Home / Self Care | Payer: Medicare Other | Source: Ambulatory Visit | Attending: Internal Medicine | Admitting: Internal Medicine

## 2018-03-27 DIAGNOSIS — R911 Solitary pulmonary nodule: Secondary | ICD-10-CM

## 2018-03-27 DIAGNOSIS — C3490 Malignant neoplasm of unspecified part of unspecified bronchus or lung: Secondary | ICD-10-CM | POA: Diagnosis not present

## 2018-03-27 LAB — GLUCOSE, CAPILLARY: GLUCOSE-CAPILLARY: 114 mg/dL — AB (ref 70–99)

## 2018-03-27 MED ORDER — FLUDEOXYGLUCOSE F - 18 (FDG) INJECTION
5.9900 | Freq: Once | INTRAVENOUS | Status: AC | PRN
  Administered 2018-03-27: 5.99 via INTRAVENOUS

## 2018-04-03 ENCOUNTER — Ambulatory Visit (INDEPENDENT_AMBULATORY_CARE_PROVIDER_SITE_OTHER): Payer: Medicare Other | Admitting: Internal Medicine

## 2018-04-03 ENCOUNTER — Encounter: Payer: Self-pay | Admitting: Internal Medicine

## 2018-04-03 VITALS — BP 114/72 | HR 61 | Ht <= 58 in | Wt 115.0 lb

## 2018-04-03 DIAGNOSIS — R911 Solitary pulmonary nodule: Secondary | ICD-10-CM | POA: Diagnosis not present

## 2018-04-03 DIAGNOSIS — R590 Localized enlarged lymph nodes: Secondary | ICD-10-CM | POA: Diagnosis not present

## 2018-04-03 DIAGNOSIS — I2510 Atherosclerotic heart disease of native coronary artery without angina pectoris: Secondary | ICD-10-CM | POA: Diagnosis not present

## 2018-04-03 DIAGNOSIS — C342 Malignant neoplasm of middle lobe, bronchus or lung: Secondary | ICD-10-CM | POA: Diagnosis not present

## 2018-04-03 DIAGNOSIS — R0982 Postnasal drip: Secondary | ICD-10-CM | POA: Diagnosis not present

## 2018-04-03 DIAGNOSIS — Z800 Family history of malignant neoplasm of digestive organs: Secondary | ICD-10-CM

## 2018-04-03 DIAGNOSIS — J449 Chronic obstructive pulmonary disease, unspecified: Secondary | ICD-10-CM

## 2018-04-03 NOTE — Addendum Note (Signed)
Addended by: Madolyn Frieze on: 04/03/2018 10:21 AM   Modules accepted: Orders

## 2018-04-03 NOTE — Patient Instructions (Addendum)
Stage 4 very severe COPD by GOLD classification ([facility]) - stable - continue current care with o2 and bronchodilators   Post-nasal drip -moderate/severe Family history of throat cancer  - refer ENT  Nodule of upper lobe of right lung - July 2019 - negative on PET scan Mar 27, 2018 - expectant followup with serial CT  Hilar adenopathy - new on Left on PET OCt 2019 - cancer v inflammation - let us take input from Dr Jana Hakim about followup v biopsy - my inclination is followup CT chest with contrast in 3 months (bronchoscopy can be risky)  Lung cancer, middle lobe ([facility]) - history  - in remission on PET scan  Followup 3 months or sooner; CAT score at followu

## 2018-04-03 NOTE — Progress Notes (Signed)
OV 05/10/2016  Chief Complaint  Patient presents with  . Follow-up    Pt here for 51month f/u. Pt states she had pna in October but states she feel back to baseline. Pt c/o mild dry cough, PND. Pt denies CP/tightness.    Follow-up Gold stage IV COPD with chronic hypoxic respiratory failure  84 year old female. This is a follow-up from summer 2017. In October 2017 she was admitted for left upper lobe pneumonia. Chest x-ray was reviewed. She is back to stay on triple inhaler therapy. She continues on oxygen. Overall she's stable. There are no new issues with COPD standpoint. In the interim she's developed low back pain without any bladder or bowel disturbance. She's had 2 emergency department visits 04/24/2016 and 04/27/2016. The second one did a chest x-ray and she has T10 and T12 wedge compression fracture. This is severe and disabling her. She has an orthopedic appointment in a few days. She is taking appropriate pain medications.  OV 08/10/2016  Chief Complaint  Patient presents with  . Follow-up    f/u 3 months COPD, more SOB last couple of days, wheezing      Follow-up Gold stage IV COPD with chronic hypoxic respiratory failure  She is now 77. This is a routine follow-up. She is to follow-up back pain but it is better. Last visit December 2017. A follow-up chest x-ray for pneumonia and that showed improvement in infiltrates but she's not had a further follow-up of the chest x-ray. Now she is telling me for the last few days she's had increased sinus drainage, wheezing, chest tightness, cough that is worse than baseline. This no fever or edema or hemoptysis  OV 11/10/2016  Chief Complaint  Patient presents with  . Follow-up    Pt c/o slight increase in wheezing, increase in SOB, mild dry cough. Pt states this is due to the pollen. Pt denies CP/tightness and f/c/s.      Follow-up Gold stage IV COPD with chronic hypoxic respiratory failure Follow-up lung cancer  surveillance   COPD: This is stable. She is on triple inhaler therapy. She is on oxygen. Today she needs a qualify oxygen saturation test. Her room a pulse ox is 88% on room air at rest. She is compliant with her inhalers. There are no new complaints.  Lung cancer surveillance: Last CT scan of the chest  was 2017 summer. She is interested in doing follow-up CT scan of the chest for recurrence surveillance this summer 2018  Past medical history: New issues that she had ventricular bigemini according to her history and was in the emergency department. She has follow-up with Dr. Skeet Latch upcoming she is asking if she can skip it because she has a grandson's graduation. She feels fine. Troponin checked in the ER was normal. This is based on my review of the chart.   OV 05/09/2017  Chief Complaint  Patient presents with  . Follow-up    Pt states that things have been a little worse since last visit. States that she is always SOB and has mild chest discomfort with exertion or if she is stressed and tired. Pt is wheezing today, 05/08/17.  DME: [facility], 2L O2      Gold stage IV advanced COPD patient and her lung cancer s/p XRT  COPD: In terms of COPD she is on triple inhaler therapy and oxygen.  Overall COPD stable but she has had some deterioration after recent breast cancer surgery and weather change.  She is having  slight increase wheezing compared to baseline.  There is no colored sputum.  She is interested in a short prednisone burst for this.  New issue of worsening postnasal drip: She is complaining of worsening postnasal drip despite saline gel and also Flonase.  There is no discharge that is of discolored.  Lung cancer surveillance: She had CT scan of the chest summer 2018.  There was a new 4 mm nodule that requires follow-up in the future  New issue of preoperative clearance: She just underwent a limited breast cancer surgery.  No radiation or chemo is planned.  She does have new  right-sided retinal detachment and is seeing a Dr. Sherlynn Stalls.  When surgery is being contemplated for her she wants conversation with the ophthalmologist for this.    OV 08/10/2017  Chief Complaint  Patient presents with  . Follow-up    Pt states she has had more problems with breathing. States she is becoming SOB more often, occ. coughing, and when she cannot get her breath has chest tightness and feels like she is choking. DME: [facility],, 3L O2   Gold stage IV advanced COPD patient and lung cancer status post radiation  COPD: She is on triple inhaler therapy and oxygen.  She says she only uses 2 L of oxygen at rest 3 L with exertion.  She says in the last few days there is increased sinus congestion and sinus fullness.  She also feels she is desaturating easily but there is no change in sputum color or no increased cough.  She is willing to try a short course of prednisone.  COPD CAT score is 33 and similar to before.  Lung cancer surveillance: Last CT scan was summer 2018.  Next CT scan should be summer 2019.  Other issues: She is having increasing hard of hearing.  Also she says she no longer has a retinal detachment but has macular degeneration.  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  She is really upset at how her life is slowly going down.   09/06/2017 Follow up: COPD , O2 RF  Patient presents for a one-month follow-up.  Last visit patient had a COPD exacerbation.  She was treated with a prednisone taper.  Patient is feeling better with less cough and dyspnea.  More back to baseline . Remains on BREO and INCRUSE .   Remains on oxygen 3l/m .  Has to use large tanks , hard for her to get around . Needs more lightweight system to be more active and independent. .  Walk test on 3l/m POC with o2 sats >90%.   Patient was set up for an ABG done last week that showed chronic hypercarbia with a pH of 7.3 and a PCO2 at 53, bicarb 31.  Patient's  chronic respiratory failure due to  Very severe COPD  That  is life threatening.  Previous ABG's have documented high PCO2 and spirometry reveals very severe obstructive ventilatory defect.  Patient would benefit from non-invasive ventilation.  Without this therapy, the patient is at high risk of ending up with worsening symptoms, worsened respiratory failure, need for ER visits and/or recurrent hospitalizations.  Bilevel device unable to adequately support patient's nocturnal ventilation needs.  Patient would benefit from NIV therapy with set tidal volumes and pressure.    OV 11/08/2017  Chief Complaint  Patient presents with  . Follow-up    Pt states she has been having some trouble  with the hot weather, has an occ. cough with occ clear mucus and also has a lot of drainage.   Marializ Ferrebee Gomer , 84 y.o. , with dob 31-Mar-1934 and female ,Not Hispanic or Latino from [address] [place] [address] - presents to pulm clinic for advanced copd followup.  Last visit was with nurse practitioner 2 months ago.  At that time because of hypercarbia noninvasive ventilation was recommended and order started.  However, up until this point she has not gotten her noninvasive ventilation at night.  She just continues with Incruse and Breo and daily oxygen therapy.  She finds herself desaturating easily sometimes to the 70s.  She tries to get her pulse ox to greater than 92%.  She does start feeling symptomatic at 84% or so.  I have advised her that goal pulse ox would only be greater than or equal to 88%.  She is compliant with her treatment regimen.  She is expressing goals of care to live long.  She wants to see her grandson get married.  She has lung cancer surveillance CT probably with the oncologist coming up this summer.  The only new issue is that because of the heat she is feeling symptoms of heavier shortness of breath and chest tightness more than usual.  She feels she will benefit from a short course of  prednisone.  There is no sputum production or fever or edema orthopnea proximal nocturnal dyspnea.      Results for TIMIKO, OFFUTT (MRN [id number]) as of 08/10/2017 09:37  Ref. Range 04/11/2017 09:15  Creatinine Latest Ref Range: 0.44 - 1.00 mg/dL 0.49   Results for LUISA, LOUK (MRN [id number]) as of 08/10/2017 09:37  Ref. Range 04/11/2017 09:15  Hemoglobin Latest Ref Range: 12.0 - 15.0 g/dL 13.4     has a past medical history of Arthritis, Asthma, Br  12/12/2017 Patient was seen by Dr. Oval Linsey [facility] on June [facility]th. During the visit her ambulating 02 sat dropped to 78% on 3L simply go mini. Patient states that it was very hot/humid that day and she had to walk a far distance to her apt. She typically only leaves that house 5-6 times a months. Dr. Oval Linsey recommended patient increase her O2 to 4L. Recent ABGs showed improvement over the last two months and patient was instructed by Dr. Chase Caller that she could stop using BiPap. Presents today to re-qualify for home O2. Complains of some chronic sinus congestion which can affect her breathing. Continues BREO, incruse, flonase, zyrtec and singulair. Prednisone usually helps her symptoms. Denies sob at rest or cough.   12/22/2017 Patient returns today for 1 week fu visit. Seen 7/8 to re-qualify for home o2. She had been using simplygo mini when leaving the house and O2sats were noticed to be dropping into the high 70's-80's. At that time patient complained of some chronic sinusitis in which prednisone was prescribed. Patient was instructed to use continuous oxygen at home, needed new concentrator from DME company.    Feels well today, no acute complaints. Completed prednisone course 4 days ago. Denies nasal congestion, cough or sputum production. Continue Breo and incurse inhalers.   OV 02/09/2018  Subjective:  Patient ID: Candi Leash, female , DOB: 04-02-1934 , age 19 y.o. , MRN: [id number] , ADDRESS: [place] [id number]   02/09/2018 -   Chief Complaint  Patient presents with  . Follow-up    CT performed 01/02/18.  Pt is  still having problems with SOB with exertion and her sats dropping to the low 80's and is still coughing and having a lot of sinus problems due to the weather.     HPI Taunja Brickner Boline 84 y.o. -presents for follow-up. In terms of her advanced COPDcat score is 30 and she is using 3-4 L of oxygen. Overall she's stable. But she is frustrated by her high symptom load and her easy desaturations. She is on in cruise and [place]. She is willing to accept that she is end-stage and these will be her baseline symptoms. She is willing to switch her inhalers to trelegy to see if she can get an age on the symptoms. She is also willing to have a high dose flu shot today  New issue CT scan July 2019 shows new right upper lobe 9 mm nodule that is spiculated and highly worrisome for non-small cell lung cancer. She has previous history of radiation for lung cancer nodule and also breast cancer  New issue: She has recurrent fevers the last 1 month. She says her baseline is 96 Fahrenheit. Her fevers are 98.4 Fahrenheit. There is no other systemic manifestations other than some fatigue that is worse. At this point she does not want to have any workup for the fevers but she wants to just observe this.     ROS - per HPI   OV 04/03/2018  Subjective:  Patient ID: Candi Leash, female , DOB: [birth year] , age 17 y.o. , MRN: [id number] , ADDRESS: [address] [place] [address]   04/03/2018 -   Chief Complaint  Patient presents with  . Follow-up    follow up after PET scan. States her breathing has been ok since the weather has changed. Increased SOB with heat humidity.      HPI Yazhini Mcaulay Nigg 84 y.o. -presents for follow-up.  Issues  COPD: She has gold stage IV COPD.  She uses 3 L oxygen.  COPD itself is stable.  No new issues.  Is up-to-date with the flu  shot  Right upper lobe lung nodule new getting worse spiculated 1 cm in July 2019: This is in the setting of right middle lobe previous lung cancer and also history of breast cancer.  So we had a PET scan done on March 27, 2018 that I personally visualized.  There is no uptake of this nodule although I think the nodule is still there.  New finding on PET scan March 27, 2018: There is a left hilar adenopathy that is lighting up  on the PET scan.  Radiologist concerned about metastatic spread of her previous right middle lobe lung cancer which was treated with radiation.  This appears stable  Other issue chronic: She is chronic postnasal drip.  She continues to complain about this being a persistent problem.  In addition she has multiple family members have had throat cancer and therefore she is worried about this.        CAT COPD Symptom & Quality of Life Score (GSK trademark) 0 is no burden. 5 is highest burden 05/09/2017  08/10/2017  02/09/2018   Never Cough -> Cough all the time 3 3 3   No phlegm in chest -> Chest is full of phlegm 3 3 5   No chest tightness -> Chest feels very tight 3 4 3   No dyspnea for 1 flight stairs/hill -> Very dyspneic for 1 flight of stairs 5 4 5   No limitations for ADL at home -> Very limited  with ADL at home 4 5 5   Confident leaving home -> Not at all confident leaving home 4 5 1   Sleep soundly -> Do not sleep soundly because of lung condition 5 5 3   Lots of Energy -> No energy at all 5 5 5   TOTAL Score (max 40)  32 33 30    IMPRESSION: 1. No significant FDG uptake associated with the recently described right upper lobe lung nodule. 2. There is a hypermetabolic left hilar lymph node which has an SUV max of 8.2. Cannot rule out focus of metastatic adenopathy in a patient who has a history of lung cancer. Consider further evaluation with contrast enhanced CT of the chest to better evaluate the hilar structures. 3. Aortic Atherosclerosis (ICD10-I70.0) and  Emphysema (ICD10-J43.9). 4. Multi vessel coronary artery atherosclerotic calcifications.   Electronically Signed   By: Kerby Moors M.D.   On: 03/27/2018 13:45   ROS - per HPI     has a past medical history of Arthritis, Asthma, Breast cancer, right breast ([facility]), Chronic lower back pain, Colon adenomas, Complication of anesthesia, COPD (chronic obstructive pulmonary disease) ([facility]), Coronary artery calcification (09/01/2016), Dysrhythmia, GERD (gastroesophageal reflux disease), Goiter, History of duodenal ulcer, History of radiation therapy (05/24/11-06/02/11), colonic polyps, Hyperlipidemia, Lung cancer, middle lobe ([facility]) (04/26/2011), On home oxygen therapy, Osteoporosis, Osteoporosis, Palpitations (01/19/2016), Pneumonia (2017 X 2), Pollen allergy, PVC (premature ventricular contraction) (02/24/2016), Shortness of breath, and Wears glasses.   reports that she quit smoking about 19 years ago. Her smoking use included cigarettes. She has a 20.00 pack-year smoking history. She has never used smokeless tobacco.  Past Surgical History:  Procedure Laterality Date  . APPENDECTOMY    . BIOPSY THYROID  2016  . BREAST BIOPSY Right 03/2017  . BREAST LUMPECTOMY WITH RADIOACTIVE SEED LOCALIZATION Right 04/11/2017  . BREAST LUMPECTOMY WITH RADIOACTIVE SEED LOCALIZATION Right 04/11/2017   Procedure: RIGHT BREAST LUMPECTOMY WITH RADIOACTIVE SEED LOCALIZATION;  Surgeon: Rolm Bookbinder, MD;  Location: [facility];  Service: General;  Laterality: Right;  . CATARACT EXTRACTION W/ INTRAOCULAR LENS  IMPLANT, BILATERAL Bilateral   . COLONOSCOPY W/ BIOPSIES    . DILATION AND CURETTAGE OF UTERUS  "many"  . TONSILLECTOMY    . TOTAL ABDOMINAL HYSTERECTOMY      Allergies  Allergen Reactions  . Tiotropium Bromide Monohydrate Hives, Shortness Of Breath and Rash    Spiriva   . Codeine Other (See Comments)    Hyperactivity,crying  . Fosamax [Alendronate] Other (See Comments)    Muscle aches  . Asa  [Aspirin] Other (See Comments)    Bleeding -ulcers  . Latex Rash    Immunization History  Administered Date(s) Administered  . Influenza Split 04/07/2013, 03/07/2014  . Influenza Whole 02/06/2011, 03/07/2012  . Influenza, High Dose Seasonal PF 03/07/2016, 02/05/2017, 02/09/2018  . Influenza,inj,Quad PF,6+ Mos 01/27/2015  . Influenza-Unspecified 01/05/2014  . Pneumococcal Conjugate-13 10/05/2013  . Pneumococcal Polysaccharide-23 06/08/2003  . Pneumococcal-Unspecified 11/05/2012    Family History  Problem Relation Age of Onset  . Emphysema Maternal Uncle   . Heart disease Mother   . Heart disease Maternal Grandfather   . Rheum arthritis Sister   . Cancer Sister   . Cancer Sister        throat cancer  . Brain cancer Sister   . Cancer Sister   . Ovarian cancer Sister   . Breast cancer Daughter      Current Outpatient Medications:  .  acetaminophen (TYLENOL) 325 MG tablet, Take 650 mg by mouth  every 4 (four) hours as needed., Disp: , Rfl:  .  albuterol (PROVENTIL HFA;VENTOLIN HFA) 108 (90 Base) MCG/ACT inhaler, Inhale 2 puffs into the lungs every 6 (six) hours as needed for wheezing or shortness of breath., Disp: , Rfl:  .  Calcium Carbonate-Vitamin D 600-200 MG-UNIT TABS, Take 1 tablet by mouth daily., Disp: , Rfl:  .  cetirizine (ZYRTEC) 10 MG tablet, Take 10 mg by mouth daily.  , Disp: , Rfl:  .  fluticasone (FLONASE) 50 MCG/ACT nasal spray, Place 2 sprays into the nose daily as needed for allergies or rhinitis. , Disp: , Rfl:  .  Fluticasone-Umeclidin-Vilant (TRELEGY ELLIPTA) 100-62.5-25 MCG/INH AEPB, Inhale 1 puff into the lungs daily., Disp: 1 each, Rfl: 5 .  metoprolol tartrate (LOPRESSOR) 25 MG tablet, Take 0.5 tablets (12.5 mg total) by mouth 2 (two) times daily., Disp: 90 tablet, Rfl: 3 .  Multiple Vitamins-Minerals (PRESERVISION AREDS 2) CAPS, Take by mouth., Disp: , Rfl:  .  Omega-3 Fatty Acids (FISH OIL) 1000 MG CAPS, Take 1,000 mg by mouth at bedtime. , Disp: , Rfl:   .  OXYGEN, Inhale into the lungs. 3L at rest and 4pulse with exertion, Disp: , Rfl:  .  rosuvastatin (CRESTOR) 10 MG tablet, Take 10 mg by mouth every other day. , Disp: , Rfl:  .  SINGULAIR 10 MG tablet, Take 10 mg by mouth at bedtime. , Disp: , Rfl:       Objective:   Vitals:   04/03/18 0943  BP: 114/72  Pulse: 61  SpO2: 98%  Weight: 115 lb (52.2 kg)  Height: 4\' 10"  (1.473 m)    Estimated body mass index is 24.04 kg/m as calculated from the following:   Height as of this encounter: 4\' 10"  (1.473 m).   Weight as of this encounter: 115 lb (52.2 kg).  @WEIGHTCHANGE @  [employer]   04/03/18 0943  Weight: 115 lb (52.2 kg)     Physical Exam  General Appearance:    Alert, cooperative, no distress, appears stated age - older , Deconditioned looking - yes , OBESE  - n, Sitting on Wheelchair -  no  Head:    Normocephalic, without obvious abnormality, atraumatic  Eyes:    PERRL, conjunctiva/corneas clear,  Ears:    Normal TM's and external ear canals, both ears  Nose:   Nares normal, septum midline, mucosa normal, no drainage    or sinus tenderness. OXYGEN ON  - yes . Patient is @ 3L   Throat:   Lips, mucosa, and tongue normal; teeth and gums normal. Cyanosis on lips - no  Neck:   Supple, symmetrical, trachea midline, no adenopathy;    thyroid:  no enlargement/tenderness/nodules; no carotid   bruit or JVD  Back:     Symmetric, no curvature, ROM normal, no CVA tenderness  Lungs:     Distress - no , Wheeze - occ. Rare, baseline, Barrell Chest - yes, Purse lip breathing - yes, Crackles - no   Chest Wall:    No tenderness or deformity.    Heart:    Regular rate and rhythm, S1 and S2 normal, no rub   or gallop, Murmur - no  Breast Exam:    NOT DONE  Abdomen:     Soft, non-tender, bowel sounds active all four quadrants,    no masses, no organomegaly. Visceral obesity - no  Genitalia:   NOT DONE  Rectal:   NOT DONE  Extremities:   Extremities - normal, Has Cane -  no, Clubbing -  no, Edema - no  Pulses:   2+ and symmetric all extremities  Skin:   Stigmata of Connective Tissue Disease - no  Lymph nodes:   Cervical, supraclavicular, and axillary nodes normal  Psychiatric:  Neurologic:   [place] - yes, Anxious - no, Flat affect - no  CAm-ICU - neg, Alert and Oriented x 3 - yes, Moves all 4s - yes, Speech - normal, Cognition - intact           Assessment:       ICD-10-CM   1. Stage 4 very severe COPD by GOLD classification ([facility]) J44.9   2. Post-nasal drip R09.82   3. Family history of throat cancer Z80.0   4. Nodule of upper lobe of right lung R91.1   5. Hilar adenopathy R59.0   6. Lung cancer, middle lobe ([facility]) C34.2        Plan:     Patient Instructions  Stage 4 very severe COPD by GOLD classification ([facility]) - stable - continue current care with o2 and bronchodilators   Post-nasal drip -moderate/severe Family history of throat cancer  - refer ENT  Nodule of upper lobe of right lung - July 2019 - negative on PET scan Mar 27, 2018 - expectant followup with serial CT  Hilar adenopathy - new on Left on PET OCt 2019 - cancer v inflammation - let us take input from Dr Jana Hakim about followup v biopsy - my inclination is followup CT chest with contrast in 3 months (bronchoscopy can be risky)  Lung cancer, middle lobe ([facility]) - history  - in remission on PET scan  Followup 3 months or sooner; CAT score at followu      SIGNATURE    Dr. Brand Males, M.D., F.C.C.P,  Pulmonary and Critical Care Medicine Staff Physician, [place] Director - Interstitial Lung Disease  Program  [facility] [place], [place], [address]  Pager: [phone], If no answer or between  15:00h - 7:00h: call [phone] Telephone: [phone]  10:15 AM 04/03/2018

## 2018-04-06 ENCOUNTER — Other Ambulatory Visit: Payer: Self-pay | Admitting: Cardiovascular Disease

## 2018-04-06 MED ORDER — METOPROLOL TARTRATE 25 MG PO TABS: 12.5000 mg | ORAL_TABLET | Freq: Two times a day (BID) | ORAL | 0 refills | Status: DC

## 2018-04-06 NOTE — Telephone Encounter (Signed)
Rx has been sent to the pharmacy electronically. ° °

## 2018-04-06 NOTE — Telephone Encounter (Signed)
°*  STAT* If patient is at the pharmacy, call can be transferred to refill team.   1. Which medications need to be refilled? (please list name of each medication and dose if known) Metoprolol  2. Which pharmacy/location (including street and city if local pharmacy) is medication to be sent to? [facility]- [phone] 3. Do they need a 30 day or 90 day supply? 90 and refills

## 2018-04-10 DIAGNOSIS — H43822 Vitreomacular adhesion, left eye: Secondary | ICD-10-CM | POA: Diagnosis not present

## 2018-04-10 DIAGNOSIS — H353122 Nonexudative age-related macular degeneration, left eye, intermediate dry stage: Secondary | ICD-10-CM | POA: Diagnosis not present

## 2018-04-10 DIAGNOSIS — D3132 Benign neoplasm of left choroid: Secondary | ICD-10-CM | POA: Diagnosis not present

## 2018-04-10 DIAGNOSIS — H353211 Exudative age-related macular degeneration, right eye, with active choroidal neovascularization: Secondary | ICD-10-CM | POA: Diagnosis not present

## 2018-04-13 DIAGNOSIS — Z853 Personal history of malignant neoplasm of breast: Secondary | ICD-10-CM | POA: Diagnosis not present

## 2018-04-17 DIAGNOSIS — E785 Hyperlipidemia, unspecified: Secondary | ICD-10-CM | POA: Diagnosis not present

## 2018-04-20 ENCOUNTER — Ambulatory Visit (INDEPENDENT_AMBULATORY_CARE_PROVIDER_SITE_OTHER)
Admission: RE | Admit: 2018-04-20 | Discharge: 2018-04-20 | Disposition: A | Discharge status: Home / Self Care | Payer: Medicare Other | Source: Ambulatory Visit | Attending: Internal Medicine | Admitting: Internal Medicine

## 2018-04-20 DIAGNOSIS — R911 Solitary pulmonary nodule: Secondary | ICD-10-CM

## 2018-04-20 DIAGNOSIS — R918 Other nonspecific abnormal finding of lung field: Secondary | ICD-10-CM | POA: Diagnosis not present

## 2018-04-24 DIAGNOSIS — E785 Hyperlipidemia, unspecified: Secondary | ICD-10-CM | POA: Diagnosis not present

## 2018-04-24 DIAGNOSIS — J309 Allergic rhinitis, unspecified: Secondary | ICD-10-CM | POA: Diagnosis not present

## 2018-04-24 DIAGNOSIS — D0511 Intraductal carcinoma in situ of right breast: Secondary | ICD-10-CM | POA: Diagnosis not present

## 2018-04-24 DIAGNOSIS — K219 Gastro-esophageal reflux disease without esophagitis: Secondary | ICD-10-CM | POA: Diagnosis not present

## 2018-04-24 DIAGNOSIS — M810 Age-related osteoporosis without current pathological fracture: Secondary | ICD-10-CM | POA: Diagnosis not present

## 2018-04-24 DIAGNOSIS — H35341 Macular cyst, hole, or pseudohole, right eye: Secondary | ICD-10-CM | POA: Diagnosis not present

## 2018-04-24 DIAGNOSIS — J9611 Chronic respiratory failure with hypoxia: Secondary | ICD-10-CM | POA: Diagnosis not present

## 2018-04-24 DIAGNOSIS — C3490 Malignant neoplasm of unspecified part of unspecified bronchus or lung: Secondary | ICD-10-CM | POA: Diagnosis not present

## 2018-04-24 DIAGNOSIS — I2510 Atherosclerotic heart disease of native coronary artery without angina pectoris: Secondary | ICD-10-CM | POA: Diagnosis not present

## 2018-04-25 DIAGNOSIS — Z789 Other specified health status: Secondary | ICD-10-CM | POA: Diagnosis not present

## 2018-04-25 DIAGNOSIS — J310 Chronic rhinitis: Secondary | ICD-10-CM | POA: Diagnosis not present

## 2018-04-27 ENCOUNTER — Telehealth: Payer: Self-pay | Admitting: Oncology

## 2018-04-27 ENCOUNTER — Inpatient Hospital Stay: Payer: Medicare Other | Attending: Oncology | Admitting: Oncology

## 2018-04-27 VITALS — BP 116/45 | HR 68 | Temp 97.8°F | Resp 18 | Ht <= 58 in | Wt 117.7 lb

## 2018-04-27 DIAGNOSIS — C342 Malignant neoplasm of middle lobe, bronchus or lung: Secondary | ICD-10-CM

## 2018-04-27 DIAGNOSIS — Z85118 Personal history of other malignant neoplasm of bronchus and lung: Secondary | ICD-10-CM | POA: Diagnosis not present

## 2018-04-27 DIAGNOSIS — J449 Chronic obstructive pulmonary disease, unspecified: Secondary | ICD-10-CM

## 2018-04-27 DIAGNOSIS — D0511 Intraductal carcinoma in situ of right breast: Secondary | ICD-10-CM

## 2018-04-27 DIAGNOSIS — Z853 Personal history of malignant neoplasm of breast: Secondary | ICD-10-CM | POA: Diagnosis not present

## 2018-04-27 NOTE — Telephone Encounter (Signed)
Gave patient avs and calendar.   °

## 2018-04-27 NOTE — Progress Notes (Signed)
[facility] [place]  [phone]) [phone] [phone]     ID: Heather Barnes DOB: [birth year]  MR#: [id number]  ZYS#:[id number]  Patient Care Team: [name], MD as PCP - General (Family Medicine) Brand Males, MD as Consulting Physician (Pulmonary Disease) Rolm Bookbinder, MD as Consulting Physician (General Surgery) Nica Friske, Virgie Dad, MD as Consulting Physician (Oncology) Gery Pray, MD as Consulting Physician (Radiation Oncology) Skeet Latch, MD as Attending Physician (Cardiology) Gerda Diss, DO as Consulting Physician (Family Medicine) OTHER MD:  CHIEF COMPLAINT: Ductal carcinoma in situ; history of lung cancer  CURRENT TREATMENT: Observation   HISTORY OF CURRENT ILLNESS: From the original intake note:  Heather Barnes had routine bilateral screening mammography at [facility] 02/11/2017 showing the breast density to be category B. There were pleomorphic calcifications noted in the right breast upper outer quadrant and she was recalled for right diagnostic mammography 02/15/2017. This confirmed the finding and on 03/07/2017 she underwent right breast upper outer quadrant biopsy showing ([id number]) ductal carcinoma in situ, high-grade, weakly estrogen receptor positive at 5%, progesterone receptor negative. On the same day she had right axillary ultrasonography which showed no suspicious axillary lymph nodes  The patient also has a history of right middle lobe lung cancer, 0.8 cm on CT of the chest 12/25/2010, status post right middle lobe biopsy 04/14/2011, showing ([id number]) non-small cell lung cancer most consistent with squamous cell, treated with radiation only (60 gray, in 5 fractions, given between 05/24/2011 and 06/02/2011) (, with CT scan of the chest 12/22/2016 showing no evidence of local recurrence in the right middle lobe, but a new 0.4 cm left upper lobe solid nodule warranting repeat chest CT in 6 months.  The patient's subsequent  history is as detailed below.  INTERVAL HISTO Heather Barnes returns today for follow-up of her estrogen receptor positive breast cancer. She is accompanied by her daughter in law.  Since her last visit here, she underwent a PET on 03/27/2018 showing no significant FDG uptake associated with the recently described right upper lobe lung nodule. There is a hypermetabolic left hilar lymph node which has an SUV max of 8.2. Cannot rule out focus of metastatic adenopathy in a patient who has a history of lung cancer. Consider further evaluation with contrast enhanced CT of the chest to better evaluate the hilar structures. Aortic Atherosclerosis (ICD10-I70.0) and Emphysema (ICD10-J43.9). Multi vessel coronary artery atherosclerotic calcifications.  This was followed by CT of the chest 04/20/2018 which showed the area in question to have pretty much resolved.  She really also had her lateral diagnostic mammography with tomography at [facility] 04/13/2018 showing the breast density to be category B.  There was no evidence of malignancy.   REVIEW OF SYSTEMS: Heather Barnes is well overall. She has macular degeneration, but has been getting shots to treat it. She does not have to receive another shot until April 2020. She has difficult staying on the line while reading and telling apart certain numbers like 3 and 8. She has difficulty seeing up close even with glasses. She saw a Ear/Nose/Throat specialist, Dr. Constance Holster, for a runny nose. She was instructed to take a nasal spray to treat symptoms.  She also is working closely with Dr. Chase Caller as far as her lungs are concerned.  The patient denies unusual headaches, nausea, vomiting, or dizziness. There has been no unusual cough, phlegm production, or pleurisy. This been no change in bowel or bladder habits. The patient denies unexplained fatigue or unexplained weight loss, bleeding, rash, or fever. A  detailed review of systems was otherwise noncontributory.     PAST MEDICAL  HISTORY: Past Medical History:  Diagnosis Date  . Arthritis    "joints" (04/11/2017)  . Asthma   . Breast cancer, right breast ([place])    S/P RIGHT BREAST LUMPECTOMY WITH RADIOACTIVE SEED LOCALIZATION 04/11/2017  . Chronic lower back pain    "if I stand too long" (04/11/2017)  . Colon adenomas   . Complication of anesthesia    Must sit upright 45-60 degree angle per pulmonology  . COPD (chronic obstructive pulmonary disease) ([place])    "severe" (04/11/2017)  . Coronary artery calcification 09/01/2016  . Dysrhythmia    bigeminy  . GERD (gastroesophageal reflux disease)   . Goiter   . History of duodenal ulcer   . History of radiation therapy 05/24/11-06/02/11   R middle lobe lung/ 60 gray  . Hx of colonic polyps   . Hyperlipidemia   . Lung cancer, middle lobe ([place]) 04/26/2011   S/P radiation 05/24/11-06/02/11  . On home oxygen therapy    "3L; all the time" (04/11/2017)  . Osteoporosis   . Osteoporosis   . Palpitations 01/19/2016   a. event monitor in 01/2016 showed PVC's with one episode of ventricular quadrigeminy.  . Pneumonia 2017 X 2  . Pollen allergy   . PVC (premature ventricular contraction) 02/24/2016  . Shortness of breath   . Wears glasses     PAST SURGICAL HISTORY: Past Surgical History:  Procedure Laterality Date  . APPENDECTOMY    . BIOPSY THYROID  2016  . BREAST BIOPSY Right 03/2017  . BREAST LUMPECTOMY WITH RADIOACTIVE SEED LOCALIZATION Right 04/11/2017  . BREAST LUMPECTOMY WITH RADIOACTIVE SEED LOCALIZATION Right 04/11/2017   Procedure: RIGHT BREAST LUMPECTOMY WITH RADIOACTIVE SEED LOCALIZATION;  Surgeon: Rolm Bookbinder, MD;  Location: [place];  Service: General;  Laterality: Right;  . CATARACT EXTRACTION W/ INTRAOCULAR LENS  IMPLANT, BILATERAL Bilateral   . COLONOSCOPY W/ BIOPSIES    . DILATION AND CURETTAGE OF UTERUS  "many"  . TONSILLECTOMY    . TOTAL ABDOMINAL HYSTERECTOMY    Total hysterectomy at age 79.  FAMILY HISTORY Family History  Problem  Relation Age of Onset  . Emphysema Maternal Uncle   . Heart disease Mother   . Heart disease Maternal Grandfather   . Rheum arthritis Sister   . Cancer Sister   . Cancer Sister        throat cancer  . Brain cancer Sister   . Cancer Sister   . Ovarian cancer Sister   . Breast cancer Daughter   Pt father died in his mid 59's from stroke and DM. Pt mother died at age 49. She has 2 brothers and 4 sisters. Pt sister had ovarian cancer that was diagnosed in her 40's at Stage IV. Her sister died at age 52. There is no family hx of breast cancer.    GYNECOLOGIC HISTORY:  Menarche: 84 years old Age at first live birth: 84 years old GP: GXP2 LMP: Approximately at age 41, when patient underwent hysterectomy with bilateral salpingo-oophorectomy  Contraceptive:  remote OCP.  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 She is widowed. At home her daughter Heather Barnes, a retired Pharmacist, hospital currently on disability, lives with her. Son [name] lives in [place] and works as a Art gallery manager. The patient has 2 grandsons. She is a Protestant.  ADVANCED DIRECTIVES: Yes   HEALTH MAINTENANCE: Social History   Tobacco Use  . Smoking status: Former Smoker  Packs/day: 0.50    Years: 40.00    Pack years: 20.00    Types: Cigarettes    Last attempt to quit: 06/07/1998    Years since quitting: 19.9  . Smokeless tobacco: Never Used  Substance Use Topics  . Alcohol use: No  . Drug use: No     Colonoscopy: Age 19    PAP: in her 69's   Bone density: 2017    Allergies  Allergen Reactions  . Tiotropium Bromide Monohydrate Hives, Shortness Of Breath and Rash    Spiriva   . Codeine Other (See Comments)    Hyperactivity,crying  . Fosamax [Alendronate] Other (See Comments)    Muscle aches  . Asa [Aspirin] Other (See Comments)    Bleeding -ulcers  . Latex Rash    Current Outpatient Medications  Medication Sig Dispense Refill  . acetaminophen (TYLENOL) 325 MG tablet Take 650 mg by mouth  every 4 (four) hours as needed.    [name] albuterol (PROVENTIL HFA;VENTOLIN HFA) 108 (90 Base) MCG/ACT inhaler Inhale 2 puffs into the lungs every 6 (six) hours as needed for wheezing or shortness of breath.    . Calcium Carbonate-Vitamin D 600-200 MG-UNIT TABS Take 1 tablet by mouth daily.    . cetirizine (ZYRTEC) 10 MG tablet Take 10 mg by mouth daily.      . fluticasone (FLONASE) 50 MCG/ACT nasal spray Place 2 sprays into the nose daily as needed for allergies or rhinitis.     . Fluticasone-Umeclidin-Vilant (TRELEGY ELLIPTA) 100-62.5-25 MCG/INH AEPB Inhale 1 puff into the lungs daily. 1 each 5  . metoprolol tartrate (LOPRESSOR) 25 MG tablet Take 0.5 tablets (12.5 mg total) by mouth 2 (two) times daily. 90 tablet 0  . Multiple Vitamins-Minerals (PRESERVISION AREDS 2) CAPS Take by mouth.    . Omega-3 Fatty Acids (FISH OIL) 1000 MG CAPS Take 1,000 mg by mouth at bedtime.     . OXYGEN Inhale into the lungs. 3L at rest and 4pulse with exertion    . rosuvastatin (CRESTOR) 10 MG tablet Take 10 mg by mouth every other day.     [name] SINGULAIR 10 MG tablet Take 10 mg by mouth at bedtime.      No current facility-administered medications for this visit.     OBJECTIVE: Elderly white woman examined in a wheelchair  Vitals:   04/27/18 1348  BP: (!) 116/45  Pulse: 68  Resp: 18  Temp: 97.8 F (36.6 C)  SpO2: 96%     Body mass index is 24.6 kg/m.   Wt Readings from Last 3 Encounters:  04/27/18 117 lb 11.2 oz (53.4 kg)  04/03/18 115 lb (52.2 kg)  02/09/18 116 lb 3.2 oz (52.7 kg)      ECOG FS:2 - Symptomatic, <50% confined to bed  Sclerae unicteric, EOMs intact Oropharynx clear and moist  No cervical or supraclavicular adenopathy Lungs no rales or rhonchi; she is wearing a nasal cannula and carrying a portable oxygen concentrator Heart regular rate and rhythm Abd soft, nontender, positive bowel sounds MSK mild kyphosis but no focal spinal tenderness, no upper extremity lymphedema Neuro: nonfocal,  well oriented, appropriate affect Breasts: No suspicious masses.  Both axillae are benign.  LAB RESULTS:  CMP     Component Value Date/Time   NA 138 04/11/2017 0915   NA 143 03/16/2017 0823   K 4.2 04/11/2017 0915   K 4.6 03/16/2017 0823   CL 99 (L) 04/11/2017 0915   CO2 31 04/11/2017 0915   CO2 35 (  H) 03/16/2017 0823   GLUCOSE 99 04/11/2017 0915   GLUCOSE 91 03/16/2017 0823   BUN 7 04/11/2017 0915   BUN 9.9 03/16/2017 0823   CREATININE 0.49 04/11/2017 0915   CREATININE 0.7 03/16/2017 0823   CALCIUM 9.6 04/11/2017 0915   CALCIUM 10.0 03/16/2017 0823   PROT 6.9 03/16/2017 0823   ALBUMIN 3.9 03/16/2017 0823   AST 19 03/16/2017 0823   ALT 19 03/16/2017 0823   ALKPHOS 53 03/16/2017 0823   BILITOT 0.55 03/16/2017 0823   GFRNONAA >60 04/11/2017 0915   GFRAA >60 04/11/2017 0915    No results found for: TOTALPROTELP, ALBUMINELP, A1GS, A2GS, BETS, BETA2SER, GAMS, MSPIKE, SPEI  No results found for: KPAFRELGTCHN, LAMBDASER, KAPLAMBRATIO  Lab Results  Component Value Date   WBC 9.0 04/11/2017   NEUTROABS 3.4 03/16/2017   HGB 13.4 04/11/2017   HCT 42.6 04/11/2017   MCV 89.7 04/11/2017   PLT 199 04/11/2017    @LASTCHEMISTRY @  No results found for: LABCA2  No components found for: [id number]  No results for input(s): INR in the last 168 hours.  No results found for: LABCA2  No results found for: TFT732  No results found for: KGU542  No results found for: HCW237  No results found for: CA2729  No components found for: HGQUANT  No results found for: CEA1 / No results found for: CEA1   No results found for: AFPTUMOR  No results found for: CHROMOGRNA  No results found for: PSA1  No visits with results within 3 Day(s) from this visit.  Latest known visit with results is:  Hospital Outpatient Visit on 03/27/2018  Component Date Value Ref Range Status  . Glucose-Capillary 03/27/2018 114* 70 - 99 mg/dL Final    (this displays the last labs from the last 3  days)  No results found for: TOTALPROTELP, ALBUMINELP, A1GS, A2GS, BETS, BETA2SER, GAMS, MSPIKE, SPEI (this displays SPEP labs)  No results found for: KPAFRELGTCHN, LAMBDASER, KAPLAMBRATIO (kappa/lambda light chains)  No results found for: HGBA, HGBA2QUANT, HGBFQUANT, HGBSQUAN (Hemoglobinopathy evaluation)   No results found for: LDH  No results found for: IRON, TIBC, IRONPCTSAT (Iron and TIBC)  No results found for: FERRITIN  Urinalysis    Component Value Date/Time   COLORURINE YELLOW 06/11/2015 1308   APPEARANCEUR CLOUDY (A) 06/11/2015 1308   LABSPEC 1.018 06/11/2015 1308   PHURINE 6.0 06/11/2015 1308   GLUCOSEU >1000 (A) 06/11/2015 1308   HGBUR TRACE (A) 06/11/2015 1308   BILIRUBINUR NEGATIVE 06/11/2015 1308   [place] 06/11/2015 1308   PROTEINUR NEGATIVE 06/11/2015 1308   NITRITE NEGATIVE 06/11/2015 1308   LEUKOCYTESUR SMALL (A) 06/11/2015 1308     STUDIES:  PET on 03/27/2018 showing no significant FDG uptake associated with the recently described right upper lobe lung nodule. There is a hypermetabolic left hilar lymph node which has an SUV max of 8.2. Cannot rule out focus of metastatic adenopathy in a patient who has a history of lung cancer. Consider further evaluation with contrast enhanced CT of the chest to better evaluate the hilar structures. Aortic Atherosclerosis (ICD10-I70.0) and Emphysema (ICD10-J43.9). Multi vessel coronary artery atherosclerotic calcifications.  Ct Chest Wo Contrast  Result Date: 04/20/2018 CLINICAL DATA:  Followup pulmonary nodule. EXAM: CT CHEST WITHOUT CONTRAST TECHNIQUE: Multidetector CT imaging of the chest was performed following the standard protocol without IV contrast. COMPARISON:  PET-CT 03/27/2018 and chest CT 01/02/2018. FINDINGS: Cardiovascular: The heart is normal in size. No pericardial effusion. Stable tortuosity, mild ectasia and moderate atherosclerotic calcifications involving the  thoracic aorta and branch  vessels including three-vessel coronary artery calcifications. Mediastinum/Nodes: Small scattered mediastinal and hilar lymph nodes without definite mass or adenopathy without contrast. The esophagus is grossly normal. There was a questionably positive left hilar node on the prior PET-CT. I think this is actually the left atrial appendage and not a lymph node based on multiple prior CT scans. Lungs/Pleura: The irregular nodular density in the right upper lobe appears relatively stable to slightly smaller. On the coronal and sagittal images it appears much more linear and scar like. Recommend continued surveillance. No new pulmonary lesions. Stable dense basilar scarring changes bilaterally. Stable focus of right middle lobe radiation fibrosis without recurrent tumor in the adjacent rib demonstrates radiation osteitis. Upper Abdomen: No significant upper abdominal findings. No hepatic or adrenal gland metastasis. Advanced atherosclerotic calcifications involving the upper abdominal aorta. Musculoskeletal: No breast masses, supraclavicular or axillary adenopathy. Stable bilateral thyroid gland lesions. No significant bony findings. Stable lower thoracic compression fractures and osteoporosis. Stable sclerotic and partially destructive changes involving the ninth right rib. IMPRESSION: 1. The right upper lobe nodular density has a much more linear appearance on the sagittal and coronal images and is most likely an area of scarring. No change since prior studies. Recommend continued surveillance. 2. No new pulmonary lesions. 3. The area of hypermetabolism in the left hilar region on the prior PET scan is most likely the the left atrial appendage. No definite mediastinal or hilar adenopathy. 4. Stable emphysematous changes and areas of dense pulmonary scarring. 5. Stable radiation changes involving the right middle lobe and radiation changes involving the right ninth rib laterally. Aortic Atherosclerosis (ICD10-I70.0)  and Emphysema (ICD10-J43.9). Electronically Signed   By: Marijo Sanes M.D.   On: 04/20/2018 16:09    ELIGIBLE FOR AVAILABLE RESEARCH PROTOCOL: no  ASSESSMENT: 84 y.o. [place] woman Status post right breast upper outer quadrant biopsy 03/07/2017, for ductal carcinoma in situ, high-grade, weakly estrogen receptor positive, progesterone receptor negative  (1) status post right lumpectomy 04/11/2017 for ductal carcinoma in situ, high-grade, measuring 2.0 cm.  Tumor was broadly less than 0.1 cm to the posterior margin  (2) despite the close deep margin I am comfortable foregoing adjuvant radiation given pulmonary history and limited functional status and morbidities  (3) no anti-estrogens planned given the low likelihood of benefit  (4) history of right middle lobe squamous cell lung cancer, cT1A, status post radiation completed 06/02/2011  (a) left upper lobe nodule noted on CT scan July 2018 may warrant follow-up  PLAN: Cedricka is a year out from definitive surgery for her noninvasive breast cancer with no evidence of disease activity.  This is very favorable.  She is working hard at getting her lungs to work better and today she was able to saying "I am going up the under" including the chorus.  We reviewed her recent PET and CT scans which are very favorable.  She will return to see me in 1 year.  She knows to call for any other issues that may develop before that visit.Jana Hakim, Virgie Dad, MD  04/27/18 2:22 PM Medical Oncology and Hematology [facility] [address] [place], [place] [address] Tel. [phone]    Fax. [phone]    I, Jacqualyn Posey, am acting as a Education administrator for Chauncey Cruel, MD.   I, Lurline Del MD, have reviewed the above documentation for accuracy and completeness, and I agree with the above.

## 2018-06-14 ENCOUNTER — Ambulatory Visit: Payer: Medicare Other | Admitting: Cardiovascular Disease

## 2018-06-15 ENCOUNTER — Encounter: Payer: Self-pay | Admitting: Cardiovascular Disease

## 2018-06-15 ENCOUNTER — Ambulatory Visit (INDEPENDENT_AMBULATORY_CARE_PROVIDER_SITE_OTHER): Payer: Medicare Other | Admitting: Cardiovascular Disease

## 2018-06-15 VITALS — BP 122/58 | HR 67 | Ht <= 58 in | Wt 119.2 lb

## 2018-06-15 DIAGNOSIS — R002 Palpitations: Secondary | ICD-10-CM

## 2018-06-15 DIAGNOSIS — E7800 Pure hypercholesterolemia, unspecified: Secondary | ICD-10-CM | POA: Diagnosis not present

## 2018-06-15 DIAGNOSIS — I2510 Atherosclerotic heart disease of native coronary artery without angina pectoris: Secondary | ICD-10-CM | POA: Diagnosis not present

## 2018-06-15 DIAGNOSIS — I259 Chronic ischemic heart disease, unspecified: Secondary | ICD-10-CM

## 2018-06-15 NOTE — Progress Notes (Signed)
Cardiology Office Note   Date:  06/15/2018   ID:  [name], [name] 22-Aug-1933, MRN [id number]  PCP:  [name], MD  Cardiologist:   Skeet Latch, MD   No chief complaint on file.    History of Present Illness: Heather Barnes is a 85 y.o. female with asymptomatic coronary calcifications, PVCs, hyperlipidemia, severe COPD, breast cancer, and lung cancer in remission who presents for follow up.  She was initially evaluated 01/2015 due to coronary calcifications in the LM, LAD and RCA identified on cardiac CT.   Heather Barnes reported exertional dyspnea that had been getting progressively worse for years.  This was attributed to both her COPD and lung cancer and she declined stress testing.  Heather Barnes can't tolerate aspirin due to bleeding ulcers.  She was started on atorvastatin but developed myalgias.  She was hospitalized with pneumonia 06/2015 and again 03/2016.  Since then she has been struggling to recover and remains short of breath.  Heather Barnes previously reported frequent palpitations.  She was referred for a 7 day event monitor 01/2016 that showed occasional PVCs with an episode of ventricular quadrigeminy.  She notes that while wearing the monitor her oxygen levels were dropping with minimal exertion.  She also developed a compression fracture and continues to have back pain.  Heather Barnes was diagnosed with breast cancer on a routine mammogram 02/2017.  Biopsy 03/07/17 revealed ductal carcinoma in situ, high-grade, weakly ER positive, PR negative.  Heather Barnes was admitted for lumpectomy 04/12/17.  She spent the night due to her severe COPD.  She followed up with Dr. Anastasia Pall who typically would recommend radiation given her close margins.  However, given her underlying lung disease this was not pursued.  She has also been struggling with progressive macular degeneration.  Her breathing has been especially poor this summer 2/2 heat.  She saw Dr. Chase Caller in early June and received a  prednisone taper that initially helped but her breathing remains poor.  She struggles to walk very short distances.  She has no increased cough or sputum.  She also denies chest pain or pressure.  She has no lower extremity edema, orthopnea or PND.  She struggled with statins in the past but was started on rosuvastatin 10mg  qweek due to LDL 188.  She has been able to increase this to twice weekly and is tolerating it well.  She will be having lipids rechecked with her PCP this spring.  Overall her breathing continues to be a struggle.  She has good days and bad days and today is a bad day.  She no longer walks outside the house and uses her wheelchair almost exclusively.  It takes her a couple hours to get dressed because of her breathing.  She has no chest pain, no lower extremity edema, orthopnea, or PND.  She notes that her sister had a stroke over the holidays.  She is recovering well.  Past Medical History:  Diagnosis Date  . Arthritis    "joints" (04/11/2017)  . Asthma   . Breast cancer, right breast ([place])    S/P RIGHT BREAST LUMPECTOMY WITH RADIOACTIVE SEED LOCALIZATION 04/11/2017  . Chronic lower back pain    "if I stand too long" (04/11/2017)  . Colon adenomas   . Complication of anesthesia    Must sit upright 45-60 degree angle per pulmonology  . COPD (chronic obstructive pulmonary disease) ([place])    "severe" (04/11/2017)  . Coronary artery calcification 09/01/2016  . Dysrhythmia  bigeminy  . GERD (gastroesophageal reflux disease)   . Goiter   . History of duodenal ulcer   . History of radiation therapy 05/24/11-06/02/11   R middle lobe lung/ 60 gray  . Hx of colonic polyps   . Hyperlipidemia   . Lung cancer, middle lobe ([place]) 04/26/2011   S/P radiation 05/24/11-06/02/11  . On home oxygen therapy    "3L; all the time" (04/11/2017)  . Osteoporosis   . Osteoporosis   . Palpitations 01/19/2016   a. event monitor in 01/2016 showed PVC's with one episode of ventricular quadrigeminy.    . Pneumonia 2017 X 2  . Pollen allergy   . PVC (premature ventricular contraction) 02/24/2016  . Shortness of breath   . Wears glasses     Past Surgical History:  Procedure Laterality Date  . APPENDECTOMY    . BIOPSY THYROID  2016  . BREAST BIOPSY Right 03/2017  . BREAST LUMPECTOMY WITH RADIOACTIVE SEED LOCALIZATION Right 04/11/2017  . BREAST LUMPECTOMY WITH RADIOACTIVE SEED LOCALIZATION Right 04/11/2017   Procedure: RIGHT BREAST LUMPECTOMY WITH RADIOACTIVE SEED LOCALIZATION;  Surgeon: Rolm Bookbinder, MD;  Location: [facility];  Service: General;  Laterality: Right;  . CATARACT EXTRACTION W/ INTRAOCULAR LENS  IMPLANT, BILATERAL Bilateral   . COLONOSCOPY W/ BIOPSIES    . DILATION AND CURETTAGE OF UTERUS  "many"  . TONSILLECTOMY    . TOTAL ABDOMINAL HYSTERECTOMY       Current Outpatient Medications  Medication Sig Dispense Refill  . acetaminophen (TYLENOL) 325 MG tablet Take 650 mg by mouth every 4 (four) hours as needed.    [name] albuterol (PROVENTIL HFA;VENTOLIN HFA) 108 (90 Base) MCG/ACT inhaler Inhale 2 puffs into the lungs every 6 (six) hours as needed for wheezing or shortness of breath.    . Calcium Carbonate-Vitamin D 600-200 MG-UNIT TABS Take 1 tablet by mouth daily.    . cetirizine (ZYRTEC) 10 MG tablet Take 10 mg by mouth daily.      . fluticasone (FLONASE) 50 MCG/ACT nasal spray Place 2 sprays into the nose daily as needed for allergies or rhinitis.     . Fluticasone-Umeclidin-Vilant (TRELEGY ELLIPTA) 100-62.5-25 MCG/INH AEPB Inhale 1 puff into the lungs daily. 1 each 5  . metoprolol tartrate (LOPRESSOR) 25 MG tablet Take 0.5 tablets (12.5 mg total) by mouth 2 (two) times daily. 90 tablet 0  . Multiple Vitamins-Minerals (PRESERVISION AREDS 2) CAPS Take by mouth.    . Omega-3 Fatty Acids (FISH OIL) 1000 MG CAPS Take 1,000 mg by mouth at bedtime.     . OXYGEN Inhale into the lungs. 3L at rest and 4pulse with exertion    . rosuvastatin (CRESTOR) 10 MG tablet Take 10 mg by mouth  every other day.     [name] SINGULAIR 10 MG tablet Take 10 mg by mouth at bedtime.      No current facility-administered medications for this visit.     Allergies:   Tiotropium bromide monohydrate; Codeine; Fosamax [alendronate]; Asa [aspirin]; and Latex    Social History:  The patient  reports that she quit smoking about 20 years ago. Her smoking use included cigarettes. She has a 20.00 pack-year smoking history. She has never used smokeless tobacco. She reports that she does not drink alcohol or use drugs.   Family History:  The patient's family history includes Brain cancer in her sister; Breast cancer in her daughter; Cancer in her sister, sister, and sister; Emphysema in her maternal uncle; Heart disease in her maternal grandfather and mother;  Ovarian cancer in her sister; Rheum arthritis in her sister.    ROS:  Please see the history of present illness.   Otherwise, review of systems are positive for none.   All other systems are reviewed and negative.    PHYSICAL EXAM: VS:  BP (!) 122/58   Pulse 67   Ht 4\' 10"  (1.473 m)   Wt 119 lb 3.2 oz (54.1 kg)   BMI 24.91 kg/m  , BMI Body mass index is 24.91 kg/m. GENERAL:  Chronically ill-appearing.  Severe espiratory distress with walking.  Mild respiratory distress while sitting and talking.   HEENT: Pupils equal round and reactive, fundi not visualized, oral mucosa unremarkable NECK:  No jugular venous distention, waveform within normal limits, carotid upstroke brisk and symmetric, no bruits LUNGS: Poor air movement.  Diffuse expiratory wheezing. HEART:  RRR.  PMI not displaced or sustained,S1 and S2 within normal limits, no S3, no S4, no clicks, no rubs, no murmurs ABD:  Flat, positive bowel sounds normal in frequency in pitch, no bruits, no rebound, no guarding, no midline pulsatile mass, no hepatomegaly, no splenomegaly EXT:  2 plus pulses throughout, no edema, no cyanosis no clubbing SKIN:  No rashes no nodules NEURO:  Cranial nerves  II through XII grossly intact, motor grossly intact throughout PSYCH:  Cognitively intact, oriented to person place and time   EKG:  EKG is ordered today. The ekg ordered 01/19/16 demonstrates sinus rhythm at 82 bpm.   09/01/16: Sinus rhythm.  Rate 78 bpm.   05/11/17: Sinus rhythm.  Rate 72 bpm. 11/29/17: Sinus rhythm.  Rate 75 bpm.   06/15/2018: Sinus rhythm.  Rate 67 bpm.  7 Day Event Monitor 01/21/16:  Quality: Fair.  Baseline artifact. Predominant rhythm: sinus  Average heart rate: 77 bpm Max heart rate: 110 bpm Min heart rate: 64 bpm  Occasional PVCs, with an episode of ventricular quadrigeminy.   Chest CT 12/20/14: Atherosclerosis of the thoracic aorta, mediastinal great vessels, LM, LAD and RCA.  Recent Labs: No results found for requested labs within last 8760 hours.   03/16/17: Sodium 138, potassium 4.2, BUN 7, creatinine 0.49 AST 19, ALT 19 WBC 9, hemoglobin 13.4, hematocrit 42.6, platelets 199 Total cholesterol 270, triglycerides 103, HDL 74, LDL 175  Lipid Panel    Component Value Date/Time   CHOL 186 01/21/2016 1126   TRIG 97 01/21/2016 1126   HDL 86 01/21/2016 1126   CHOLHDL 2.2 01/21/2016 1126   VLDL 19 01/21/2016 1126   LDLCALC 81 01/21/2016 1126      Wt Readings from Last 3 Encounters:  06/15/18 119 lb 3.2 oz (54.1 kg)  04/27/18 117 lb 11.2 oz (53.4 kg)  04/03/18 115 lb (52.2 kg)      ASSESSMENT AND PLAN:  # Severe COPD: # Chronic hypoxic respiratory failure: Ms.  Barnes respiratory status continues to decline.  This also makes her very anxious.  She no longer does much walking because of her breathing status.  We discussed stopping metoprolol given that it could potentially be contributing.  However it helps to control her palpitations and she is not interested in making this change.  # Asymptomatic coronary calcifications:  # Hyperlipidemia:  Heather Barnes remains asymptomatic.  Continue metoprolol.  She was able to increase her rosuvastatin to  twice a week and is tolerating this okay.  She will continue to have this followed with her PCP.  # PVCs: Symptoms improved on metoprolol.   # Hypertension: Blood pressure well-controlled.  Continue metoprolol.  Current medicines are reviewed at length with the patient today.  The patient does not have concerns regarding medicines.  The following changes have been made: none    Labs/ tests ordered today include: none   No orders of the defined types were placed in this encounter.    Disposition:   FU with Dr. Jonelle Sidle C. Timnath in 4 months.   Signed, Skeet Latch, MD  06/15/2018 12:09 PM    [facility]

## 2018-06-15 NOTE — Patient Instructions (Signed)
Medication Instructions:  Your physician recommends that you continue on your current medications as directed. Please refer to the Current Medication list given to you today.  If you need a refill on your cardiac medications before your next appointment, please call your pharmacy.   Lab work: NONE  Testing/Procedures: NONE  Follow-Up: At [facility], you and your health needs are our priority.  As part of our continuing mission to provide you with exceptional heart care, we have created designated Provider Care Teams.  These Care Teams include your primary Cardiologist (physician) and Advanced Practice Providers (APPs -  Physician Assistants and Nurse Practitioners) who all work together to provide you with the care you need, when you need it. You will need a follow up appointment in 4 months.  You may see DR [facility] or one of the following Advanced Practice Providers on your designated Care Team:   Kerin Ransom, PA-C Roby Lofts, [place] . Sande Rives, PA-C

## 2018-06-27 ENCOUNTER — Other Ambulatory Visit: Payer: Self-pay | Admitting: Cardiovascular Disease

## 2018-06-27 NOTE — Telephone Encounter (Signed)
Rx has been sent to the pharmacy electronically. ° °

## 2018-07-04 ENCOUNTER — Ambulatory Visit (INDEPENDENT_AMBULATORY_CARE_PROVIDER_SITE_OTHER): Payer: Medicare Other | Admitting: Internal Medicine

## 2018-07-04 ENCOUNTER — Encounter: Payer: Self-pay | Admitting: Internal Medicine

## 2018-07-04 VITALS — BP 100/50 | HR 64 | Ht <= 58 in | Wt 120.6 lb

## 2018-07-04 DIAGNOSIS — I2510 Atherosclerotic heart disease of native coronary artery without angina pectoris: Secondary | ICD-10-CM | POA: Diagnosis not present

## 2018-07-04 DIAGNOSIS — J9611 Chronic respiratory failure with hypoxia: Secondary | ICD-10-CM

## 2018-07-04 DIAGNOSIS — J449 Chronic obstructive pulmonary disease, unspecified: Secondary | ICD-10-CM | POA: Diagnosis not present

## 2018-07-04 NOTE — Patient Instructions (Addendum)
Stage 4 very severe COPD by GOLD classification ([name]) - stable - continue current care with o2 and  Trelegy - no role for bipap based on June 2019 ABG   Nodule of upper lobe of right lung - July 2019 - negative on PET scan Mar 27, 2018 and stable on CT Nov 2019 - expectant followup with serial CT  Hilar adenopathy - new on Left on PET OCt 2019 - reported as heart atrial appendage on CT Nov 2019   Lung cancer, middle lobe [facility]) - history  - in remission on PET scan Oct 2019 and CT Nov 2019  Followup 3 months or sooner; CAT score at followup

## 2018-07-04 NOTE — Progress Notes (Signed)
OV 05/10/2016  Chief Complaint  Patient presents with  . Follow-up    Pt here for 18month f/u. Pt states she had pna in October but states she feel back to baseline. Pt c/o mild dry cough, PND. Pt denies CP/tightness.    Follow-up Gold stage IV COPD with chronic hypoxic respiratory failure  85 year old female. This is a follow-up from summer 2017. In October 2017 she was admitted for left upper lobe pneumonia. Chest x-ray was reviewed. She is back to stay on triple inhaler therapy. She continues on oxygen. Overall she's stable. There are no new issues with COPD standpoint. In the interim she's developed low back pain without any bladder or bowel disturbance. She's had 2 emergency department visits 04/24/2016 and 04/27/2016. The second one did a chest x-ray and she has T10 and T12 wedge compression fracture. This is severe and disabling her. She has an orthopedic appointment in a few days. She is taking appropriate pain medications.  OV 08/10/2016  Chief Complaint  Patient presents with  . Follow-up    f/u 3 months COPD, more SOB last couple of days, wheezing      Follow-up Gold stage IV COPD with chronic hypoxic respiratory failure  She is now 79. This is a routine follow-up. She is to follow-up back pain but it is better. Last visit December 2017. A follow-up chest x-ray for pneumonia and that showed improvement in infiltrates but she's not had a further follow-up of the chest x-ray. Now she is telling me for the last few days she's had increased sinus drainage, wheezing, chest tightness, cough that is worse than baseline. This no fever or edema or hemoptysis  OV 11/10/2016  Chief Complaint  Patient presents with  . Follow-up    Pt c/o slight increase in wheezing, increase in SOB, mild dry cough. Pt states this is due to the pollen. Pt denies CP/tightness and f/c/s.      Follow-up Gold stage IV COPD with chronic hypoxic respiratory failure Follow-up lung cancer  surveillance   COPD: This is stable. She is on triple inhaler therapy. She is on oxygen. Today she needs a qualify oxygen saturation test. Her room a pulse ox is 88% on room air at rest. She is compliant with her inhalers. There are no new complaints.  Lung cancer surveillance: Last CT scan of the chest  was 2017 summer. She is interested in doing follow-up CT scan of the chest for recurrence surveillance this summer 2018  Past medical history: New issues that she had ventricular bigemini according to her history and was in the emergency department. She has follow-up with Dr. Skeet Latch upcoming she is asking if she can skip it because she has a grandson's graduation. She feels fine. Troponin checked in the ER was normal. This is based on my review of the chart.   OV 05/09/2017  Chief Complaint  Patient presents with  . Follow-up    Pt states that things have been a little worse since last visit. States that she is always SOB and has mild chest discomfort with exertion or if she is stressed and tired. Pt is wheezing today, 05/08/17.  DME: [facility], 2L O2      Gold stage IV advanced COPD patient and her lung cancer s/p XRT  COPD: In terms of COPD she is on triple inhaler therapy and oxygen.  Overall COPD stable but she has had some deterioration after recent breast cancer surgery and weather change.  She is  having slight increase wheezing compared to baseline.  There is no colored sputum.  She is interested in a short prednisone burst for this.  New issue of worsening postnasal drip: She is complaining of worsening postnasal drip despite saline gel and also Flonase.  There is no discharge that is of discolored.  Lung cancer surveillance: She had CT scan of the chest summer 2018.  There was a new 4 mm nodule that requires follow-up in the future  New issue of preoperative clearance: She just underwent a limited breast cancer surgery.  No radiation or chemo is planned.  She does have new  right-sided retinal detachment and is seeing a Dr. Sherlynn Stalls.  When surgery is being contemplated for her she wants conversation with the ophthalmologist for this.    OV 08/10/2017  Chief Complaint  Patient presents with  . Follow-up    Pt states she has had more problems with breathing. States she is becoming SOB more often, occ. coughing, and when she cannot get her breath has chest tightness and feels like she is choking. DME: [facility],, 3L O2   Gold stage IV advanced COPD patient and lung cancer status post radiation  COPD: She is on triple inhaler therapy and oxygen.  She says she only uses 2 L of oxygen at rest 3 L with exertion.  She says in the last few days there is increased sinus congestion and sinus fullness.  She also feels she is desaturating easily but there is no change in sputum color or no increased cough.  She is willing to try a short course of prednisone.  COPD CAT score is 33 and similar to before.  Lung cancer surveillance: Last CT scan was summer 2018.  Next CT scan should be summer 2019.  Other issues: She is having increasing hard of hearing.  Also she says she no longer has a retinal detachment but has macular degeneration.  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  She is really upset at how her life is slowly going down.   09/06/2017 Follow up: COPD , O2 RF  Patient presents for a one-month follow-up.  Last visit patient had a COPD exacerbation.  She was treated with a prednisone taper.  Patient is feeling better with less cough and dyspnea.  More back to baseline . Remains on BREO and INCRUSE .   Remains on oxygen 3l/m .  Has to use large tanks , hard for her to get around . Needs more lightweight system to be more active and independent. .  Walk test on 3l/m POC with o2 sats >90%.   Patient was set up for an ABG done last week that showed chronic hypercarbia with a pH of 7.3 and a PCO2 at 53, bicarb 31.  Patient's  chronic respiratory failure due to  Very severe COPD  That  is life threatening.  Previous ABG's have documented high PCO2 and spirometry reveals very severe obstructive ventilatory defect.  Patient would benefit from non-invasive ventilation.  Without this therapy, the patient is at high risk of ending up with worsening symptoms, worsened respiratory failure, need for ER visits and/or recurrent hospitalizations.  Bilevel device unable to adequately support patient's nocturnal ventilation needs.  Patient would benefit from NIV therapy with set tidal volumes and pressure.    OV 11/08/2017  Chief Complaint  Patient presents with  . Follow-up    Pt states she has been having some  trouble with the hot weather, has an occ. cough with occ clear mucus and also has a lot of drainage.   Abbigal Radich [place] , 85 y.o. , with dob 1933-12-20 and female ,Not Hispanic or Latino from [address] [place] [address] - presents to pulm clinic for advanced copd followup.  Last visit was with nurse practitioner 2 months ago.  At that time because of hypercarbia noninvasive ventilation was recommended and order started.  However, up until this point she has not gotten her noninvasive ventilation at night.  She just continues with Incruse and Breo and daily oxygen therapy.  She finds herself desaturating easily sometimes to the 70s.  She tries to get her pulse ox to greater than 92%.  She does start feeling symptomatic at 84% or so.  I have advised her that goal pulse ox would only be greater than or equal to 88%.  She is compliant with her treatment regimen.  She is expressing goals of care to live long.  She wants to see her grandson get married.  She has lung cancer surveillance CT probably with the oncologist coming up this summer.  The only new issue is that because of the heat she is feeling symptoms of heavier shortness of breath and chest tightness more than usual.  She feels she will benefit from a short course of  prednisone.  There is no sputum production or fever or edema orthopnea proximal nocturnal dyspnea.      Results for KAYDIN, LABO (MRN [id number]) as of 08/10/2017 09:37  Ref. Range 04/11/2017 09:15  Creatinine Latest Ref Range: 0.44 - 1.00 mg/dL 0.49   Results for MARGARETTE, VANNATTER (MRN [id number]) as of 08/10/2017 09:37  Ref. Range 04/11/2017 09:15  Hemoglobin Latest Ref Range: 12.0 - 15.0 g/dL 13.4     has a past medical history of Arthritis, Asthma, Br  12/12/2017 Patient was seen by Dr. Oval Linsey [facility] on June [facility]th. During the visit her ambulating 02 sat dropped to 78% on 3L simply go mini. Patient states that it was very hot/humid that day and she had to walk a far distance to her apt. She typically only leaves that house 5-6 times a months. Dr. Oval Linsey recommended patient increase her O2 to 4L. Recent ABGs showed improvement over the last two months and patient was instructed by Dr. Chase Caller that she could stop using BiPap. Presents today to re-qualify for home O2. Complains of some chronic sinus congestion which can affect her breathing. Continues BREO, incruse, flonase, zyrtec and singulair. Prednisone usually helps her symptoms. Denies sob at rest or cough.   12/22/2017 Patient returns today for 1 week fu visit. Seen 7/8 to re-qualify for home o2. She had been using simplygo mini when leaving the house and O2sats were noticed to be dropping into the high 70's-80's. At that time patient complained of some chronic sinusitis in which prednisone was prescribed. Patient was instructed to use continuous oxygen at home, needed new concentrator from DME company.    Feels well today, no acute complaints. Completed prednisone course 4 days ago. Denies nasal congestion, cough or sputum production. Continue Breo and incurse inhalers.   OV 02/09/2018  Subjective:  Patient ID: Candi Leash, female , DOB: 1933/09/02 , age 37 y.o. , MRN: [id number] , ADDRESS: [place] [address]   02/09/2018 -   Chief Complaint  Patient presents with  . Follow-up    CT performed 01/02/18.  Pt  is still having problems with SOB with exertion and her sats dropping to the low 80's and is still coughing and having a lot of sinus problems due to the weather.     HPI Shavy Beachem Kosinski 85 y.o. -presents for follow-up. In terms of her advanced COPDcat score is 30 and she is using 3-4 L of oxygen. Overall she's stable. But she is frustrated by her high symptom load and her easy desaturations. She is on in cruise and [place]. She is willing to accept that she is end-stage and these will be her baseline symptoms. She is willing to switch her inhalers to trelegy to see if she can get an age on the symptoms. She is also willing to have a high dose flu shot today  New issue CT scan July 2019 shows new right upper lobe 9 mm nodule that is spiculated and highly worrisome for non-small cell lung cancer. She has previous history of radiation for lung cancer nodule and also breast cancer  New issue: She has recurrent fevers the last 1 month. She says her baseline is 96 Fahrenheit. Her fevers are 98.4 Fahrenheit. There is no other systemic manifestations other than some fatigue that is worse. At this point she does not want to have any workup for the fevers but she wants to just observe this.     ROS - per HPI   OV 04/03/2018  Subjective:  Patient ID: Candi Leash, female , DOB: 01/18/1934 , age 76 y.o. , MRN: [id number] , ADDRESS: [address] [place] [address]   04/03/2018 -   Chief Complaint  Patient presents with  . Follow-up    follow up after PET scan. States her breathing has been ok since the weather has changed. Increased SOB with heat humidity.      HPI Ladon Heney Walt 85 y.o. -presents for follow-up.  Issues  COPD: She has gold stage IV COPD.  She uses 3 L oxygen.  COPD itself is stable.  No new issues.  Is up-to-date with the flu  shot  Right upper lobe lung nodule new getting worse spiculated 1 cm in July 2019: This is in the setting of right middle lobe previous lung cancer and also history of breast cancer.  So we had a PET scan done on March 27, 2018 that I personally visualized.  There is no uptake of this nodule although I think the nodule is still there.  New finding on PET scan March 27, 2018: There is a left hilar adenopathy that is lighting up  on the PET scan.  Radiologist concerned about metastatic spread of her previous right middle lobe lung cancer which was treated with radiation.  This appears stable  Other issue chronic: She is chronic postnasal drip.  She continues to complain about this being a persistent problem.  In addition she has multiple family members have had throat cancer and therefore she is worried about this.       IMPRESSION: 1. No significant FDG uptake associated with the recently described right upper lobe lung nodule. 2. There is a hypermetabolic left hilar lymph node which has an SUV max of 8.2. Cannot rule out focus of metastatic adenopathy in a patient who has a history of lung cancer. Consider further evaluation with contrast enhanced CT of the chest to better evaluate the hilar structures. 3. Aortic Atherosclerosis (ICD10-I70.0) and Emphysema (ICD10-J43.9). 4. Multi vessel coronary artery atherosclerotic calcifications.   Electronically Signed   By: Kerby Moors  M.D.   On: 03/27/2018 13:45   OV 07/04/2018  Subjective:  Patient ID: Candi Leash, female , DOB: [birth year] , age 57 y.o. , MRN: [id number] , ADDRESS: [address] [place] [address]   07/04/2018 -   Chief Complaint  Patient presents with  . Follow-up    Pt states she still has been having trouble with her breathing but states is it worse when it is cold, has had an occ cough and some sneezing but denies any CP.   Patient Care Team: [name], MD as PCP - General (Family  Medicine) Brand Males, MD as Consulting Physician (Pulmonary Disease) Rolm Bookbinder, MD as Consulting Physician (General Surgery) Magrinat, Virgie Dad, MD as Consulting Physician (Oncology) Gery Pray, MD as Consulting Physician (Radiation Oncology) Skeet Latch, MD as Attending Physician (Cardiology) Gerda Diss, DO as Consulting Physician (Family Medicine) OTHER MD:  HPI Dallys Nowakowski Berkey 85 y.o. -presents for follow-up of gold stage IV COPD on chronic hypoxemic respiratory failure.  She is on 3 L oxygen.  Although today in the office she needed to be switched to 4 L pulsed.  COPD CAT score is the same as before.  I think the functional status might be slowly declining.  She is doing well on Trelegy and oxygen.  She did see ENT Dr. Constance Holster in the interim and has been reassured with the vocal cord exam.  She also states that beyond keeping her nostrils moist there is no other solution for occasional/repeated epistaxis.  She has seen Dr. Skeet Latch and cardiology to stop metoprolol to see if this would help her COPD symptoms but given her palpitation history she decided to keep it on.  She did have a CT chest in NOv 2019 and is seeing Dr. Jana Hakim.  I reviewed his notes and he has been reassured.  The scans are being followed by Dr. Jana Hakim     CAT COPD Symptom & Quality of Life Score ([employer] trademark) 0 is no burden. 5 is highest burden 05/09/2017  08/10/2017  02/09/2018  07/04/2018   Never Cough -> Cough all the time 3 3 3 2   No phlegm in chest -> Chest is full of phlegm 3 3 5 3   No chest tightness -> Chest feels very tight 3 4 3 5   No dyspnea for 1 flight stairs/hill -> Very dyspneic for 1 flight of stairs 5 4 5 5   No limitations for ADL at home -> Very limited with ADL at home 4 5 5 4   Confident leaving home -> Not at all confident leaving home 4 5 1 2   Sleep soundly -> Do not sleep soundly because of lung condition 5 5 3 5   Lots of Energy -> No energy at all 5 5 5  3   TOTAL Score (max 40)  32 33 30 29     ROS - per HPI     has a past medical history of Arthritis, Asthma, Breast cancer, right breast ([facility]), Chronic lower back pain, Colon adenomas, Complication of anesthesia, COPD (chronic obstructive pulmonary disease) ([facility]), Coronary artery calcification (09/01/2016), Dysrhythmia, GERD (gastroesophageal reflux disease), Goiter, History of duodenal ulcer, History of radiation therapy (05/24/11-06/02/11), colonic polyps, Hyperlipidemia, Lung cancer, middle lobe ([facility]) (04/26/2011), On home oxygen therapy, Osteoporosis, Osteoporosis, Palpitations (01/19/2016), Pneumonia (2017 X 2), Pollen allergy, PVC (premature ventricular contraction) (02/24/2016), Shortness of breath, and Wears glasses.   reports that she quit smoking about 20 years ago. Her smoking use included cigarettes. She has a 20.00  pack-year smoking history. She has never used smokeless tobacco.  Past Surgical History:  Procedure Laterality Date  . APPENDECTOMY    . BIOPSY THYROID  2016  . BREAST BIOPSY Right 03/2017  . BREAST LUMPECTOMY WITH RADIOACTIVE SEED LOCALIZATION Right 04/11/2017  . BREAST LUMPECTOMY WITH RADIOACTIVE SEED LOCALIZATION Right 04/11/2017   Procedure: RIGHT BREAST LUMPECTOMY WITH RADIOACTIVE SEED LOCALIZATION;  Surgeon: Rolm Bookbinder, MD;  Location: [facility];  Service: General;  Laterality: Right;  . CATARACT EXTRACTION W/ INTRAOCULAR LENS  IMPLANT, BILATERAL Bilateral   . COLONOSCOPY W/ BIOPSIES    . DILATION AND CURETTAGE OF UTERUS  "many"  . TONSILLECTOMY    . TOTAL ABDOMINAL HYSTERECTOMY      Allergies  Allergen Reactions  . Tiotropium Bromide Monohydrate Hives, Shortness Of Breath and Rash    Spiriva   . Codeine Other (See Comments)    Hyperactivity,crying  . Fosamax [Alendronate] Other (See Comments)    Muscle aches  . Asa [Aspirin] Other (See Comments)    Bleeding -ulcers  . Latex Rash    Immunization History  Administered Date(s) Administered  .  Influenza Split 04/07/2013, 03/07/2014  . Influenza Whole 02/06/2011, 03/07/2012  . Influenza, High Dose Seasonal PF 03/07/2016, 02/05/2017, 02/09/2018  . Influenza,inj,Quad PF,6+ Mos 01/27/2015  . Influenza-Unspecified 01/05/2014  . Pneumococcal Conjugate-13 10/05/2013  . Pneumococcal Polysaccharide-23 06/08/2003  . Pneumococcal-Unspecified 11/05/2012    Family History  Problem Relation Age of Onset  . Emphysema Maternal Uncle   . Heart disease Mother   . Heart disease Maternal Grandfather   . Rheum arthritis Sister   . Cancer Sister   . Cancer Sister        throat cancer  . Brain cancer Sister   . Cancer Sister   . Ovarian cancer Sister   . Breast cancer Daughter      Current Outpatient Medications:  .  acetaminophen (TYLENOL) 325 MG tablet, Take 650 mg by mouth every 4 (four) hours as needed., Disp: , Rfl:  .  albuterol (PROVENTIL HFA;VENTOLIN HFA) 108 (90 Base) MCG/ACT inhaler, Inhale 2 puffs into the lungs every 6 (six) hours as needed for wheezing or shortness of breath., Disp: , Rfl:  .  Calcium Carbonate-Vitamin D 600-200 MG-UNIT TABS, Take 1 tablet by mouth daily., Disp: , Rfl:  .  cetirizine (ZYRTEC) 10 MG tablet, Take 10 mg by mouth daily.  , Disp: , Rfl:  .  fluticasone (FLONASE) 50 MCG/ACT nasal spray, Place 2 sprays into the nose daily as needed for allergies or rhinitis. , Disp: , Rfl:  .  Fluticasone-Umeclidin-Vilant (TRELEGY ELLIPTA) 100-62.5-25 MCG/INH AEPB, Inhale 1 puff into the lungs daily., Disp: 1 each, Rfl: 5 .  metoprolol tartrate (LOPRESSOR) 25 MG tablet, TAKE 1/2 (ONE-HALF) TABLET BY MOUTH TWICE DAILY, Disp: 90 tablet, Rfl: 3 .  Multiple Vitamins-Minerals (PRESERVISION AREDS 2) CAPS, Take by mouth., Disp: , Rfl:  .  Omega-3 Fatty Acids (FISH OIL) 1000 MG CAPS, Take 1,000 mg by mouth at bedtime. , Disp: , Rfl:  .  OXYGEN, Inhale into the lungs. 3L at rest and 4pulse with exertion, Disp: , Rfl:  .  rosuvastatin (CRESTOR) 10 MG tablet, Take 10 mg by mouth  every other day. , Disp: , Rfl:  .  SINGULAIR 10 MG tablet, Take 10 mg by mouth at bedtime. , Disp: , Rfl:       Objective:   Vitals:   07/04/18 1014  BP: (!) 100/50  Pulse: 64  SpO2: 100%  Weight: 120  lb 9.6 oz (54.7 kg)  Height: 4\' 10"  (1.473 m)    Estimated body mass index is 25.21 kg/m as calculated from the following:   Height as of this encounter: 4\' 10"  (1.473 m).   Weight as of this encounter: 120 lb 9.6 oz (54.7 kg).  @WEIGHTCHANGE @  [employer]   07/04/18 1014  Weight: 120 lb 9.6 oz (54.7 kg)     Physical Exam  General Appearance:    Alert, cooperative, no distress, appears stated age - older , Deconditioned looking - yes , OBESE  - no, Sitting on Wheelchair -  yes  Head:    Normocephalic, without obvious abnormality, atraumatic  Eyes:    PERRL, conjunctiva/corneas clear,  Ears:    Normal TM's and external ear canals, both ears  Nose:   Nares normal, septum midline, mucosa normal, no drainage    or sinus tenderness. OXYGEN ON  - yes . Patient is @ 4L [place] continuous   Throat:   Lips, mucosa, and tongue normal; teeth and gums normal. Cyanosis on lips - no  Neck:   Supple, symmetrical, trachea midline, no adenopathy;    thyroid:  no enlargement/tenderness/nodules; no carotid   bruit or JVD  Back:     Symmetric, no curvature, ROM normal, no CVA tenderness  Lungs:     Distress - no , Wheeze faint, baseline, Barrell Chest - yes, Purse lip breathing - yes baseline, Crackles - no   Chest Wall:    No tenderness or deformity.    Heart:    Regular rate and rhythm, S1 and S2 normal, no rub   or gallop, Murmur - no  Breast Exam:    NOT DONE  Abdomen:     Soft, non-tender, bowel sounds active all four quadrants,    no masses, no organomegaly. Visceral obesity - no  Genitalia:   NOT DONE  Rectal:   NOT DONE  Extremities:   Extremities - normal, Has Cane - no, Clubbing - no, Edema - no  Pulses:   2+ and symmetric all extremities  Skin:   Stigmata of Connective Tissue  Disease - no  Lymph nodes:   Cervical, supraclavicular, and axillary nodes normal  Psychiatric:  Neurologic:   Pleasant - yes, Anxious - no, Flat affect - no  CAm-ICU - neg, Alert and Oriented x 3 - yes, Moves all 4s - yes, Speech - normal, Cognition - intact           Assessment:       ICD-10-CM   1. Stage 4 very severe COPD by GOLD classification ([facility]) J44.9   2. Chronic respiratory failure with hypoxia ([facility]) J96.11        Plan:     Patient Instructions  Stage 4 very severe COPD by GOLD classification ([facility]) - stable - continue current care with o2 and  Trelegy - no role for bipap based on June 2019 ABG   Nodule of upper lobe of right lung - July 2019 - negative on PET scan Mar 27, 2018 and stable on CT Nov 2019 - expectant followup with serial CT  Hilar adenopathy - new on Left on PET OCt 2019 - reported as heart atrial appendage on CT Nov 2019   Lung cancer, middle lobe [facility]) - history  - in remission on PET scan Oct 2019 and CT Nov 2019  Followup 3 months or sooner; CAT score at followup   .   SIGNATURE    Dr. Brand Males, M.D., F.C.C.P,  Pulmonary and Critical Care Medicine Staff Physician, [facility] Director - Interstitial Lung Disease  Program  Pulmonary [place] at [place], [place], [address]  Pager: [phone], If no answer or between  15:00h - 7:00h: call [phone] Telephone: [phone]  10:34 AM 07/04/2018

## 2018-08-26 ENCOUNTER — Other Ambulatory Visit: Payer: Self-pay | Admitting: Internal Medicine

## 2018-09-25 ENCOUNTER — Other Ambulatory Visit: Payer: Self-pay | Admitting: Internal Medicine

## 2018-09-28 ENCOUNTER — Telehealth: Payer: Self-pay

## 2018-09-28 NOTE — Telephone Encounter (Signed)
Virtual Visit Pre-Appointment Phone Call Heather Barnes PHONE "(Name), I am calling you today to discuss your upcoming appointment. We are currently trying to limit exposure to the virus that causes COVID-19 by seeing patients at home rather than in the office."  1. "What is the BEST phone number to call the day of the visit?" - include this in appointment notes  2. "Do you have or have access to (through a family member/friend) a smartphone with video capability that we can use for your visit?" a. If yes - list this number in appt notes as "cell" (if different from BEST phone #) and list the appointment type as a VIDEO visit in appointment notes b. If no - list the appointment type as a PHONE visit in appointment notes  3. Confirm consent - "In the setting of the current Covid19 crisis, you are scheduled for a (phone or video) visit with your provider on (date) at (time).  Just as we do with many in-office visits, in order for you to participate in this visit, we must obtain consent.  If you'd like, I can send this to your mychart (if signed up) or email for you to review.  Otherwise, I can obtain your verbal consent now.  All virtual visits are billed to your insurance company just like a normal visit would be.  By agreeing to a virtual visit, we'd like you to understand that the technology does not allow for your provider to perform an examination, and thus may limit your provider's ability to fully assess your condition. If your provider identifies any concerns that need to be evaluated in person, we will make arrangements to do so.  Finally, though the technology is pretty good, we cannot assure that it will always work on either your or our end, and in the setting of a video visit, we may have to convert it to a phone-only visit.  In either situation, we cannot ensure that we have a secure connection.  Are you willing to proceed?" STAFF: Did the patient verbally acknowledge consent to  telehealth visit? Document YES/NO here:   4. Advise patient to be prepared - "Two hours prior to your appointment, go ahead and check your blood pressure, pulse, oxygen saturation, and your weight (if you have the equipment to check those) and write them all down. When your visit starts, your provider will ask you for this information. If you have an Apple Watch or Kardia device, please plan to have heart rate information ready on the day of your appointment. Please have a pen and paper handy nearby the day of the visit as well."  5. Give patient instructions for MyChart download to smartphone OR Doximity/Doxy.me as below if video visit (depending on what platform provider is using)  6. Inform patient they will receive a phone call 15 minutes prior to their appointment time (may be from unknown caller ID) so they should be prepared to answer    TELEPHONE CALL NOTE  Sally Menard has been deemed a candidate for a follow-up tele-health visit to limit community exposure during the Covid-19 pandemic. I spoke with the patient via phone to ensure availability of phone/video source, confirm preferred email & phone number, and discuss instructions and expectations.  I reminded Najla Aughenbaugh Tisby to be prepared with any vital sign and/or heart rhythm information that could potentially be obtained via home monitoring, at the time of her visit. I reminded Genine Beckett to expect a phone  call prior to her visit.  [place] 09/28/2018 9:35 AM   INSTRUCTIONS FOR DOWNLOADING THE MYCHART APP TO SMARTPHONE  - The patient must first make sure to have activated MyChart and know their login information - If Apple, go to [employer] and type in MyChart in the search bar and download the app. If Android, ask patient to go to [employer] and type in [place] in the search bar and download the app. The app is free but as with any other app downloads, their phone may require them to verify saved  payment information or Apple/Android password.  - The patient will need to then log into the app with their MyChart username and password, and select [facility] as their healthcare provider to link the account. When it is time for your visit, go to the MyChart app, find appointments, and click Begin Video Visit. Be sure to Select Allow for your device to access the Microphone and Camera for your visit. You will then be connected, and your provider will be with you shortly.  **If they have any issues connecting, or need assistance please contact [facility] [phone])  **If using a computer, in order to ensure the best quality for their visit they will need to use either of the following Internet Browsers: [employer], or Google Chrome  IF USING DOXIMITY or DOXY.ME - The patient will receive a link just prior to their visit by text.     FULL LENGTH CONSENT FOR TELE-HEALTH VISIT   I hereby voluntarily request, consent and authorize [facility] and its employed or contracted physicians, physician assistants, nurse practitioners or other licensed health care professionals (the Practitioner), to provide me with telemedicine health care services (the "Services") as deemed necessary by the treating Practitioner. I acknowledge and consent to receive the Services by the Practitioner via telemedicine. I understand that the telemedicine visit will involve communicating with the Practitioner through live audiovisual communication technology and the disclosure of certain medical information by electronic transmission. I acknowledge that I have been given the opportunity to request an in-person assessment or other available alternative prior to the telemedicine visit and am voluntarily participating in the telemedicine visit.  I understand that I have the right to withhold or withdraw my consent to the use of telemedicine in the course of my care at any time, without affecting my  right to future care or treatment, and that the Practitioner or I may terminate the telemedicine visit at any time. I understand that I have the right to inspect all information obtained and/or recorded in the course of the telemedicine visit and may receive copies of available information for a reasonable fee.  I understand that some of the potential risks of receiving the Services via telemedicine include:  [name] Delay or interruption in medical evaluation due to technological equipment failure or disruption; . Information transmitted may not be sufficient (e.g. poor resolution of images) to allow for appropriate medical decision making by the Practitioner; and/or  . In rare instances, security protocols could fail, causing a breach of personal health information.  Furthermore, I acknowledge that it is my responsibility to provide information about my medical history, conditions and care that is complete and accurate to the best of my ability. I acknowledge that Practitioner's advice, recommendations, and/or decision may be based on factors not within their control, such as incomplete or inaccurate data provided by me or distortions of diagnostic images or specimens that may result  from electronic transmissions. I understand that the practice of medicine is not an exact science and that Practitioner makes no warranties or guarantees regarding treatment outcomes. I acknowledge that I will receive a copy of this consent concurrently upon execution via email to the email address I last provided but may also request a printed copy by calling the office of [facility].    I understand that my insurance will be billed for this visit.   I have read or had this consent read to me. . I understand the contents of this consent, which adequately explains the benefits and risks of the Services being provided via telemedicine.  . I have been provided ample opportunity to ask questions regarding this consent and the  Services and have had my questions answered to my satisfaction. . I give my informed consent for the services to be provided through the use of telemedicine in my medical care  By participating in this telemedicine visit I agree to the above.

## 2018-10-02 NOTE — Progress Notes (Signed)
Virtual Visit via Telephone Note  I connected with Heather Barnes on 10/02/18 at 10:00 AM EDT by telephone and verified that I am speaking with the correct person using two identifiers.   I discussed the limitations, risks, security and privacy concerns of performing an evaluation and management service by telephone and the availability of in person appointments. I also discussed with the patient that there may be a patient responsible charge related to this service. The patient expressed understanding and agreed to proceed.   History of Present Illness: 85 year old female, former smoker quit in 2000. Hx COPD Stage IV, chronic respiratory failure, lung CA s/p XRT, breast CA with resection, pulmonary nodule. Patient of Dr. Chase Caller, last seen on 07/04/18. Maintained on Trelegy and oxygen. PET scan in 2019 negative and stable on CT chest in Nov 2019. Needs follow-up in 3 months with CAT score (previous score 29-33).  10/03/2018 Patient called today for 3 month follow-up. Doing well, no changes. Fighting the pollen, she is on max allergy therapy. COPD CAT score today was 26. Continues Trelegy as prescribed. Used rescue inhaler half dozen times since last office visit in Jan. Uses 3L oxygen at rest and 4L on exertion. This has been her baseline for the last 6 months. Having issues with her portable concentrator, not lasting long enough and not keeping O2 levels >90%. She likely needs portable continuous oxygen.   Observations/Objective:  - Mild DOE. No significant shortness or breath, wheezing or cough noted during phone conversation  Assessment and Plan:  COPD stage IV - CAT score 26 - Continue current treatment with Trelegy 1 puff daily and prn Albuterol hfa  - Encouraged patient to stay as active as possible   Chronic respiratory failure - Needs portable continuous oxygen   Seasonal allergic rhinitis - Continue Zyrtec, Singulair and Flonase  Follow Up Instructions:   - 3 months with  Dr. Chase Caller  I discussed the assessment and treatment plan with the patient. The patient was provided an opportunity to ask questions and all were answered. The patient agreed with the plan and demonstrated an understanding of the instructions.   The patient was advised to call back or seek an in-person evaluation if the symptoms worsen or if the condition fails to improve as anticipated.  I provided 25 minutes of non-face-to-face time during this encounter.   Martyn Ehrich, NP

## 2018-10-03 ENCOUNTER — Encounter: Payer: Self-pay | Admitting: Primary Care

## 2018-10-03 ENCOUNTER — Ambulatory Visit: Payer: Medicare Other | Admitting: Internal Medicine

## 2018-10-03 ENCOUNTER — Telehealth: Payer: Self-pay | Admitting: Primary Care

## 2018-10-03 ENCOUNTER — Ambulatory Visit (INDEPENDENT_AMBULATORY_CARE_PROVIDER_SITE_OTHER): Payer: Medicare Other | Admitting: Primary Care

## 2018-10-03 ENCOUNTER — Other Ambulatory Visit: Payer: Self-pay

## 2018-10-03 DIAGNOSIS — J449 Chronic obstructive pulmonary disease, unspecified: Secondary | ICD-10-CM | POA: Diagnosis not present

## 2018-10-03 DIAGNOSIS — J9611 Chronic respiratory failure with hypoxia: Secondary | ICD-10-CM

## 2018-10-03 NOTE — Patient Instructions (Signed)
COPD stage IV - Continue Trelegy 1 puff daily and Albuterol rescue inhaler 2 puffs every 4-6 hours - Stay as active as possible  Chronic respiratory failure - Will try to see if you qualify for portable continuous oxygen   Seasonal allergic rhinitis - Continue Zyrtec, Singulair and Flonase  Follow-up - 3 months with Dr. Chase Caller     Allergic Rhinitis, Adult Allergic rhinitis is a reaction to allergens in the air. Allergens are tiny specks (particles) in the air that cause your body to have an allergic reaction. This condition cannot be passed from person to person (is not contagious). Allergic rhinitis cannot be cured, but it can be controlled. There are two types of allergic rhinitis:  Seasonal. This type is also called hay fever. It happens only during certain times of the year.  Perennial. This type can happen at any time of the year. What are the causes? This condition may be caused by:  Pollen from grasses, trees, and weeds.  House dust mites.  Pet dander.  Mold. What are the signs or symptoms? Symptoms of this condition include:  Sneezing.  Runny or stuffy nose (nasal congestion).  A lot of mucus in the back of the throat (postnasal drip).  Itchy nose.  Tearing of the eyes.  Trouble sleeping.  Being sleepy during day. How is this treated? There is no cure for this condition. You should avoid things that trigger your symptoms (allergens). Treatment can help to relieve symptoms. This may include:  Medicines that block allergy symptoms, such as antihistamines. These may be given as a shot, nasal spray, or pill.  Shots that are given until your body becomes less sensitive to the allergen (desensitization).  Stronger medicines, if all other treatments have not worked. Follow these instructions at home: Avoiding allergens   Find out what you are allergic to. Common allergens include smoke, dust, and pollen.  Avoid them if you can. These are some of the  things that you can do to avoid allergens: ? Replace carpet with wood, tile, or vinyl flooring. Carpet can trap dander and dust. ? Clean any mold found in the home. ? Do not smoke. Do not allow smoking in your home. ? Change your heating and air conditioning filter at least once a month. ? During allergy season:  Keep windows closed as much as you can. If possible, use air conditioning when there is a lot of pollen in the air.  Use a special filter for allergies with your furnace and air conditioner.  Plan outdoor activities when pollen counts are lowest. This is usually during the early morning or evening hours.  If you do go outdoors when pollen count is high, wear a special mask for people with allergies.  When you come indoors, take a shower and change your clothes before sitting on furniture or bedding. General instructions  Do not use fans in your home.  Do not hang clothes outside to dry.  Wear sunglasses to keep pollen out of your eyes.  Wash your hands right away after you touch household pets.  Take over-the-counter and prescription medicines only as told by your doctor.  Keep all follow-up visits as told by your doctor. This is important. Contact a doctor if:  You have a fever.  You have a cough that does not go away (is persistent).  You start to make whistling sounds when you breathe (wheeze).  Your symptoms do not get better with treatment.  You have thick fluid coming from  your nose.  You start to have nosebleeds. Get help right away if:  Your tongue or your lips are swollen.  You have trouble breathing.  You feel dizzy or you feel like you are going to pass out (faint).  You have cold sweats. Summary  Allergic rhinitis is a reaction to allergens in the air.  This condition may be caused by allergens. These include pollen, dust mites, pet dander, and mold.  Symptoms include a runny, itchy nose, sneezing, or tearing eyes. You may also have  trouble sleeping or feel sleepy during the day.  Treatment includes taking medicines and avoiding allergens. You may also get shots or take stronger medicines.  Get help if you have a fever or a cough that does not stop. Get help right away if you are short of breath. This information is not intended to replace advice given to you by your health care provider. Make sure you discuss any questions you have with your health care provider. Document Released: 09/23/2010 Document Revised: 12/13/2017 Document Reviewed: 12/13/2017 Elsevier Interactive Patient Education  2019 [employer].

## 2018-10-03 NOTE — Telephone Encounter (Signed)
Called and spoke with pt who stated she received a call in regards to the O2 concentrator that was ordered at pt's visit with Derl Barrow, NP. Pt stated she had been receiving her O2 from [facility] but now is going to be receiving O2 from the new [place]. Pt stated that the conversation that she had with the company that had called her stated she was needing to provide them with a credit card number or a bank account number before they would be able to send them her any O2 supplies. I asked pt what number had called her and she stated it was a 1-800 number that had contacted her. I provided pt with the number of [employer] that we have and stated to her to contact the number of [phone]. Pt stated she would call that number and see if they are able to help her out. I stated to pt if she had any more problems to contact our office again and pt verbalized understanding. Nothing further needed.

## 2018-10-16 ENCOUNTER — Telehealth (INDEPENDENT_AMBULATORY_CARE_PROVIDER_SITE_OTHER): Payer: Medicare Other | Admitting: Cardiovascular Disease

## 2018-10-16 ENCOUNTER — Encounter: Payer: Self-pay | Admitting: Cardiovascular Disease

## 2018-10-16 DIAGNOSIS — J9620 Acute and chronic respiratory failure, unspecified whether with hypoxia or hypercapnia: Secondary | ICD-10-CM

## 2018-10-16 DIAGNOSIS — I493 Ventricular premature depolarization: Secondary | ICD-10-CM

## 2018-10-16 DIAGNOSIS — R000 Tachycardia, unspecified: Secondary | ICD-10-CM

## 2018-10-16 DIAGNOSIS — I2584 Coronary atherosclerosis due to calcified coronary lesion: Secondary | ICD-10-CM

## 2018-10-16 DIAGNOSIS — I2510 Atherosclerotic heart disease of native coronary artery without angina pectoris: Secondary | ICD-10-CM

## 2018-10-16 NOTE — Progress Notes (Signed)
Virtual Visit via Telephone Note   This visit type was conducted due to national recommendations for restrictions regarding the COVID-19 Pandemic (e.g. social distancing) in an effort to limit this patient's exposure and mitigate transmission in our community.  Due to her co-morbid illnesses, this patient is at least at moderate risk for complications without adequate follow up.  This format is felt to be most appropriate for this patient at this time.  The patient did not have access to video technology/had technical difficulties with video requiring transitioning to audio format only (telephone).  All issues noted in this document were discussed and addressed.  No physical exam could be performed with this format.  Please refer to the patient's chart for her  consent to telehealth for [facility].   Date:  10/16/2018   ID:  Heather Barnes, DOB Dec 29, 1933, MRN [id number]  Patient Location: Home Provider Location: Office  PCP:  [name], MD  Cardiologist:  Skeet Latch, MD Electrophysiologist:  None   Evaluation Performed:  Follow-Up Visit  Chief Complaint:  Shortness of breath  History of Present Illness:    Heather Barnes is a 85 y.o. female with asymptomatic coronary calcifications, PVCs, hyperlipidemia, severe COPD, breast cancer, and lung cancer in remission who presents for follow up.  She was initially evaluated 01/2015 due to coronary calcifications in the LM, LAD and RCA identified on cardiac CT.   Heather Barnes reported exertional dyspnea that had been getting progressively worse for years.  This was attributed to both her COPD and lung cancer and she declined stress testing.  Heather Barnes can't tolerate aspirin due to bleeding ulcers.  She was started on atorvastatin but developed myalgias.  She was hospitalized with pneumonia 06/2015 and again 03/2016.  Since then she has been struggling to recover and remains short of breath.  Heather Barnes previously reported frequent  palpitations.  She was referred for a 7 day event monitor 01/2016 that showed occasional PVCs with an episode of ventricular quadrigeminy.  She notes that while wearing the monitor her oxygen levels were dropping with minimal exertion.  She also developed a compression fracture and continues to have back pain.  Heather Barnes was diagnosed with breast cancer on a routine mammogram 02/2017.  Biopsy 03/07/17 revealed ductal carcinoma in situ, high-grade, weakly ER positive, PR negative.  Heather Barnes was admitted for lumpectomy 04/12/17.  She spent the night due to her severe COPD.  She followed up with Dr. Anastasia Pall who typically would recommend radiation given her close margins.  However, given her underlying lung disease this was not pursued.  She has also been struggling with progressive macular degeneration.  At her last appointment she was really struggling with exertional dyspnea.  Since then she has switched back from an oxygen concentrator to a regular oxygen tank.  This allows her to go longer periods of time away from home.  It also delivers oxygen more effectively for her and she is felt much Barnes.  She uses 3 L at rest and 4 with exertion.  Overall her breathing has been pretty stable.  She struggles some during the season due to allergies.  She is not looking forward to the hot weather which also makes her breathing worse.  She has not experienced any chest pain, lower extremity edema, orthopnea, or PND.  She continues to do well with rosuvastatin twice per week and is trying to watch her diet.  She very rarely has fried foods.  The patient does not  have symptoms concerning for COVID-19 infection (fever, chills, cough, or new shortness of breath).    Past Medical History:  Diagnosis Date   Arthritis    "joints" (04/11/2017)   Asthma    Breast cancer, right breast ([place])    S/P RIGHT BREAST LUMPECTOMY WITH RADIOACTIVE SEED LOCALIZATION 04/11/2017   Chronic lower back pain    "if I stand too long"  (04/11/2017)   Colon adenomas    Complication of anesthesia    Must sit upright 45-60 degree angle per pulmonology   COPD (chronic obstructive pulmonary disease) ([place])    "severe" (04/11/2017)   Coronary artery calcification 09/01/2016   Dysrhythmia    bigeminy   GERD (gastroesophageal reflux disease)    Goiter    History of duodenal ulcer    History of radiation therapy 05/24/11-06/02/11   R middle lobe lung/ 60 gray   Hx of colonic polyps    Hyperlipidemia    Lung cancer, middle lobe ([place]) 04/26/2011   S/P radiation 05/24/11-06/02/11   On home oxygen therapy    "3L; all the time" (04/11/2017)   Osteoporosis    Osteoporosis    Palpitations 01/19/2016   a. event monitor in 01/2016 showed PVC's with one episode of ventricular quadrigeminy.   Pneumonia 2017 X 2   Pollen allergy    PVC (premature ventricular contraction) 02/24/2016   Shortness of breath    Wears glasses    Past Surgical History:  Procedure Laterality Date   APPENDECTOMY     BIOPSY THYROID  2016   BREAST BIOPSY Right 03/2017   BREAST LUMPECTOMY WITH RADIOACTIVE SEED LOCALIZATION Right 04/11/2017   BREAST LUMPECTOMY WITH RADIOACTIVE SEED LOCALIZATION Right 04/11/2017   Procedure: RIGHT BREAST LUMPECTOMY WITH RADIOACTIVE SEED LOCALIZATION;  Surgeon: Rolm Bookbinder, MD;  Location: [facility];  Service: General;  Laterality: Right;   CATARACT EXTRACTION W/ INTRAOCULAR LENS  IMPLANT, BILATERAL Bilateral    COLONOSCOPY W/ BIOPSIES     DILATION AND CURETTAGE OF UTERUS  "many"   TONSILLECTOMY     TOTAL ABDOMINAL HYSTERECTOMY       Current Meds  Medication Sig   acetaminophen (TYLENOL) 325 MG tablet Take 650 mg by mouth every 4 (four) hours as needed.   albuterol (PROVENTIL HFA;VENTOLIN HFA) 108 (90 Base) MCG/ACT inhaler Inhale 2 puffs into the lungs every 6 (six) hours as needed for wheezing or shortness of breath.   Calcium Carbonate-Vitamin D 600-200 MG-UNIT TABS Take 1 tablet by  mouth daily.   cetirizine (ZYRTEC) 10 MG tablet Take 10 mg by mouth daily.     fluticasone (FLONASE) 50 MCG/ACT nasal spray Place 2 sprays into the nose daily as needed for allergies or rhinitis.    metoprolol tartrate (LOPRESSOR) 25 MG tablet TAKE 1/2 (ONE-HALF) TABLET BY MOUTH TWICE DAILY   Multiple Vitamins-Minerals (PRESERVISION AREDS 2) CAPS Take by mouth.   Omega-3 Fatty Acids (FISH OIL) 1000 MG CAPS Take 1,000 mg by mouth at bedtime.    OXYGEN Inhale into the lungs. 3L at rest and 4pulse with exertion   rosuvastatin (CRESTOR) 10 MG tablet Take 10 mg by mouth every other day.    SINGULAIR 10 MG tablet Take 10 mg by mouth at bedtime.    TRELEGY ELLIPTA 100-62.5-25 MCG/INH AEPB Inhale 1 puff by mouth once daily     Allergies:   Tiotropium bromide monohydrate; Codeine; Fosamax [alendronate]; Asa [aspirin]; and Latex   Social History   Tobacco Use   Smoking status: Former Smoker  Packs/day: 0.50    Years: 40.00    Pack years: 20.00    Types: Cigarettes    Last attempt to quit: 06/07/1998    Years since quitting: 20.3   Smokeless tobacco: Never Used  Substance Use Topics   Alcohol use: No   Drug use: No     Family Hx: The patient's family history includes Brain cancer in her sister; Breast cancer in her daughter; Cancer in her sister, sister, and sister; Emphysema in her maternal uncle; Heart disease in her maternal grandfather and mother; Ovarian cancer in her sister; Rheum arthritis in her sister.  ROS:   Please see the history of present illness.     All other systems reviewed and are negative.   Prior CV studies:   The following studies were reviewed today:  7 Day Event Monitor 01/21/16:  Quality: Fair. Baseline artifact. Predominant rhythm: sinus  Average heart rate: 77 bpm Max heart rate: 110 bpm Min heart rate: 64 bpm  Occasional PVCs, with an episode of ventricular quadrigeminy.   Chest CT 12/20/14: Atherosclerosis of the thoracic aorta,  mediastinal great vessels, LM, LAD and RCA.  Labs/Other Tests and Data Reviewed:    EKG:  No ECG reviewed.  Recent Labs: No results found for requested labs within last 8760 hours.   Recent Lipid Panel Lab Results  Component Value Date/Time   CHOL 186 01/21/2016 11:26 AM   TRIG 97 01/21/2016 11:26 AM   HDL 86 01/21/2016 11:26 AM   CHOLHDL 2.2 01/21/2016 11:26 AM   LDLCALC 81 01/21/2016 11:26 AM   04/17/18: Total cholesterol 197, triglycerides 154, HDL 63, LDL 103 ALT 14, AST 16   Wt Readings from Last 3 Encounters:  10/16/18 114 lb (51.7 kg)  07/04/18 120 lb 9.6 oz (54.7 kg)  06/15/18 119 lb 3.2 oz (54.1 kg)     Objective:    Ht 4\' 10"  (1.473 m)    Wt 114 lb (51.7 kg)    BMI 23.83 kg/m  GENERAL: Sounds well.  No acute distress.  No respiratory distress NEURO:  Speech fluent.  PSYCH:  Cognitively intact, oriented to person place and time   ASSESSMENT & PLAN:    # Severe COPD: # Chronic hypoxic respiratory failure: Ms.  Barnes respiratory status has been a little Barnes since switching her oxygen tank.  She continues to have significant exertional symptoms but is fairly well compensated at this time.    # Asymptomatic coronary calcifications:  # Hyperlipidemia:  Heather Barnes remains asymptomatic.  Continue metoprolol.  She was able to increase her rosuvastatin to twice a week and is tolerating this okay.    LDL was 103, which is a little higher than we would like.  However she has not tolerated higher doses.  She will continue to work on her diet.  Continue metoprolol and rosuvastatin.  She is not on aspirin due to prior GI bleeding.  # PVCs: Symptoms improved on metoprolol.   # Hypertension: Blood pressure well-controlled.  Continue metoprolol.   COVID-19 Education: The signs and symptoms of COVID-19 were discussed with the patient and how to seek care for testing (follow up with PCP or arrange E-visit).  The importance of social distancing was discussed  today.  Time:   Today, I have spent 17 minutes with the patient with telehealth technology discussing the above problems.     Medication Adjustments/Labs and Tests Ordered: Current medicines are reviewed at length with the patient today.  Concerns regarding medicines are outlined  above.   Tests Ordered: No orders of the defined types were placed in this encounter.   Medication Changes: No orders of the defined types were placed in this encounter.   Disposition:  Follow up in 6 month(s)  Signed, Skeet Latch, MD  10/16/2018 10:41 AM    [place]

## 2018-10-16 NOTE — Patient Instructions (Addendum)
Medication Instructions:  Your physician recommends that you continue on your current medications as directed. Please refer to the Current Medication list given to you today.  If you need a refill on your cardiac medications before your next appointment, please call your pharmacy.   Lab work: NONE If you have labs (blood work) drawn today and your tests are completely normal, you will receive your results only by: [name] MyChart Message (if you have MyChart) OR . A paper copy in the mail If you have any lab test that is abnormal or we need to change your treatment, we will call you to review the results.  Testing/Procedures: NONE  Follow-Up: At [facility], you and your health needs are our priority.  As part of our continuing mission to provide you with exceptional heart care, we have created designated Provider Care Teams.  These Care Teams include your primary Cardiologist (physician) and Advanced Practice Providers (APPs -  Physician Assistants and Nurse Practitioners) who all work together to provide you with the care you need, when you need it. You will need a follow up appointment in 6 months.  Please call our office 2 months in advance to schedule this appointment.  You may see Skeet Latch, MD or one of the following Advanced Practice Providers on your designated Care Team:   Kerin Ransom, PA-C Roby Lofts, [place] . Sande Rives, PA-C

## 2018-11-06 ENCOUNTER — Telehealth: Payer: Self-pay | Admitting: Internal Medicine

## 2018-11-06 NOTE — Telephone Encounter (Signed)
Called the patient back and she stated that she would not be able to go see her sister because she is on oxygen and cannot travel at this time. Stated the DME loaned her a POC while the electrical was being worked on in her home. Patient has tanks at home, but not a tank large enough for her to travel to [place] to see her sister. She also does not drive and does not have anyone that can take her.  I suggested that she contact the facility to see if there is a way to contact her sister and talk with her by phone. She stated her sister's brain has completely shut down because she had a massive stroke and there was nothing else that could be done. I expressed by condolences and sympathies to her and her family and told her to do her best to keep herself safe.  Nothing further needed at this time.

## 2018-11-13 ENCOUNTER — Telehealth: Payer: Self-pay | Admitting: General Practice

## 2018-11-13 ENCOUNTER — Ambulatory Visit (INDEPENDENT_AMBULATORY_CARE_PROVIDER_SITE_OTHER): Payer: Medicare Other | Admitting: Primary Care

## 2018-11-13 ENCOUNTER — Telehealth: Payer: Self-pay

## 2018-11-13 ENCOUNTER — Telehealth: Payer: Self-pay | Admitting: Internal Medicine

## 2018-11-13 ENCOUNTER — Other Ambulatory Visit: Payer: Medicare Other

## 2018-11-13 ENCOUNTER — Encounter: Payer: Self-pay | Admitting: Primary Care

## 2018-11-13 ENCOUNTER — Other Ambulatory Visit: Payer: Self-pay

## 2018-11-13 DIAGNOSIS — J441 Chronic obstructive pulmonary disease with (acute) exacerbation: Secondary | ICD-10-CM | POA: Diagnosis not present

## 2018-11-13 DIAGNOSIS — J449 Chronic obstructive pulmonary disease, unspecified: Secondary | ICD-10-CM

## 2018-11-13 DIAGNOSIS — Z20822 Contact with and (suspected) exposure to covid-19: Secondary | ICD-10-CM

## 2018-11-13 DIAGNOSIS — R6889 Other general symptoms and signs: Secondary | ICD-10-CM | POA: Diagnosis not present

## 2018-11-13 MED ORDER — AZELASTINE HCL 0.1 % NA SOLN: 2.0000 | Freq: Two times a day (BID) | NASAL | 12 refills | Status: DC

## 2018-11-13 MED ORDER — PREDNISONE 10 MG PO TABS: ORAL_TABLET | ORAL | 0 refills | Status: DC

## 2018-11-13 NOTE — Telephone Encounter (Signed)
Pt has been scheduled covid-19 testing.   Pt was referred by Dr. Geraldo Pitter

## 2018-11-13 NOTE — Telephone Encounter (Signed)
Need telephone or video visit

## 2018-11-13 NOTE — Telephone Encounter (Signed)
Primary Pulmonologist: Dr.Ramaswamy Last office visit and with whom: 10/03/18 BW What do we see them for (pulmonary problems): COPD stage IV/lung CA/chronic resp failure/pulm nodule  Reason for call: Pt c/o low grade temp 98.2 and sinus headache/  chest and head congestion,labored breathing since last night. Pt took some Tylenol last night. She is on 4 liters of 02 continuous.  In the last month, have you been in contact with someone who was confirmed or suspected to have Conoravirus / COVID-19?  No  Do you have any of the following symptoms developed in the last 30 days? Fever:No Cough: Yes-clear  Shortness of breath: Yes pt on 4 liters 02  When did your symptoms start?  Last night  (examples of things to ask: : When did symptoms start? Fever? Cough? Productive? Color to sputum? More sputum than usual? Wheezing? Are you having any chest pain?  Have you needed increased oxygen? Are you taking your respiratory medications? What over the counter measures have you tried?)  History of Present Illness: 85 year old female, former smoker quit in 2000. Hx COPD Stage IV, chronic respiratory failure, lung CA s/p XRT, breast CA with resection, pulmonary nodule. Patient of Dr. Chase Caller, last seen on 07/04/18. Maintained on Trelegy and oxygen. PET scan in 2019 negative and stable on CT chest in Nov 2019. Needs follow-up in 3 months with CAT score (previous score 29-33).  10/03/2018 Patient called today for 3 month follow-up. Doing well, no changes. Fighting the pollen, she is on max allergy therapy. COPD CAT score today was 26. Continues Trelegy as prescribed. Used rescue inhaler half dozen times since last office visit in Jan. Uses 3L oxygen at rest and 4L on exertion. This has been her baseline for the last 6 months. Having issues with her portable concentrator, not lasting long enough and not keeping O2 levels >90%. She likely needs portable continuous oxygen.   Observations/Objective:  - Mild DOE.  No significant shortness or breath, wheezing or cough noted during phone conversation  Assessment and Plan:  COPD stage IV - CAT score 26 - Continue current treatment with Trelegy 1 puff daily and prn Albuterol hfa  - Encouraged patient to stay as active as possible   Chronic respiratory failure - Needs portable continuous oxygen   Seasonal allergic rhinitis - Continue Zyrtec, Singulair and Flonase  Follow Up Instructions:  - 3 months with Dr. Chase Caller

## 2018-11-13 NOTE — Telephone Encounter (Signed)
11:30 Televisit scheduled.

## 2018-11-13 NOTE — Addendum Note (Signed)
Addended by: Denman George on: 11/13/2018 01:06 PM   Modules accepted: Orders

## 2018-11-13 NOTE — Progress Notes (Signed)
Virtual Visit via Video Note  I connected with Heather Barnes on 11/13/18 at 11:30 AM EDT by a video enabled telemedicine application and verified that I am speaking with the correct person using two identifiers.  Location: Patient: Home Provider: Office   I discussed the limitations of evaluation and management by telemedicine and the availability of in person appointments. The patient expressed understanding and agreed to proceed.  History of Present Illness: 85 year old female, former smoker quit in 2000. Hx COPD Stage IV, chronic respiratory failure, lung CA s/p XRT, breast CA with resection, pulmonary nodule. Patient of Dr. Chase Caller, last seen on 07/04/18. Maintained on Trelegy and oxygen. PET scan in 2019 negative and stable on CT chest in Nov 2019. Needs follow-up in 3 months with CAT score (previous score 29-33).  Previous [facility] encounter: 10/03/2018 Patient called today for 3 month follow-up. Doing well, no changes. Fighting the pollen, she is on max allergy therapy. COPD CAT score today was 26. Continues Trelegy as prescribed. Used rescue inhaler half dozen times since last office visit in Jan. Uses 3L oxygen at rest and 4L on exertion. This has been her baseline for the last 6 months. Having issues with her portable concentrator, not lasting long enough and not keeping O2 levels >90%. She likely needs portable continuous oxygen.    11/13/2018  Patient called today for acute visit with complaints of increased shortness of breath, nasal congestion, HA and low grade temp. States that her nose continuously runs, reports clear drainage. Associated chills. Temperature was 99 yesterday, took tylenol last night and recheck this morning 98.2. Continues Trelegy 1 puff daily. Using 4L oxygen. States that her sister passed away last week. She was unable to attend wake because her portable oxygen tanks do not last long enough. States that she only has two tanks that last 1.5 hr. DME company is  [employer]. Denies known sick contacts or exposure.   Observations/Objective:  Patient reported:  O2 - 98% 4L oxygen  HR - 72 Temp- 98.2   Assessment and Plan:  COPD, severe:  - Likely COPD exacerbation; sob with clear nasal drainage. No purulent mucus.  - Abx not indicated at this time - RX prednisone taper (40mg  x 3 days; 30mg  x 3 days; 20mg  x 3 days; 10mg  x 3 days) - Continue Trelegy 1 puff daily, prn albuterol hfa q 6 hours  - If shortness of breath worsens, O2 levels <88-90% on oxygen or fever please go to ED for eval   Flu-like symptoms - Low grade fever with sob - Needs COVID testing  Allergic rhinitis - Continue zyrtec, Singulair and flonase  - Add Azelastine nasal spray twice daily   Chronic respiratory failure - Continue Oxygen 4L to keep O2 >88-90% - Consider for Inogen, will contact DME   Follow Up Instructions:  - FU in 1 week televisit with NP    I discussed the assessment and treatment plan with the patient. The patient was provided an opportunity to ask questions and all were answered. The patient agreed with the plan and demonstrated an understanding of the instructions.   The patient was advised to call back or seek an in-person evaluation if the symptoms worsen or if the condition fails to improve as anticipated.  I provided 25 minutes of non-face-to-face time during this encounter.   Martyn Ehrich, NP

## 2018-11-13 NOTE — Telephone Encounter (Signed)
Please do Covid-19 test for low grade fever and SOB. Ordering provider is Geraldo Pitter.  Thank you!

## 2018-11-13 NOTE — Patient Instructions (Addendum)
Orders: - Covid testing (sob and low grade temp)  Rx: - Azelastine nasal spray twice daily (new) - Prednisone taper (40mg  x 3 days; 30mg  x 3 days; 20mg  x 3 days; 10mg  x 3 days)  Follow-up: - 1 week NP televisit - If shortness of breath worsens, O2 levels <88-90% on oxygen or fever please go to ED for eval

## 2018-11-14 LAB — NOVEL CORONAVIRUS, NAA: SARS-CoV-2, NAA: NOT DETECTED

## 2018-11-14 NOTE — Addendum Note (Signed)
Addended by: Karmen Stabs on: 11/14/2018 01:28 PM   Modules accepted: Orders

## 2018-11-15 ENCOUNTER — Telehealth: Payer: Self-pay | Admitting: *Deleted

## 2018-11-15 NOTE — Telephone Encounter (Signed)
I called pt and let her know her COVID-19 test was negative which means she does not have the virus. She said she is feeling much better than on Monday.  "The medicine they gave me really helped".    She was not interested in doing Mychart.

## 2018-11-17 ENCOUNTER — Telehealth: Payer: Self-pay | Admitting: Primary Care

## 2018-11-17 NOTE — Telephone Encounter (Signed)
Returned call to pt who states order placed on yesterday for portable device that does continuous and pulsed 02. Pt was contacted by [employer] and made aware they do not have a device that goes higher than 3 liter. Pt informed them that at times she has to cut her oxygen up to 4 liters. She wants her rx updated 3 liters @ rest and 4 liters with exertion. She is going to look into Inogen She will f/u with office if she can afford. Nothing further needed at this time.

## 2018-11-23 ENCOUNTER — Telehealth: Payer: Self-pay | Admitting: Primary Care

## 2018-11-23 NOTE — Telephone Encounter (Signed)
Primary Pulmonologist: Ramaswamy Last office visit and with whom: 11/13/2018 with Beth What do we see them for (pulmonary problems): COPD  Reason for call: / States that she is feeling better since seeing Beth on 11/13/2018 but she is now having issues with severe fatigue and weakness, nausea. Pt has one more day of prednisone.  In the last month, have you been in contact with someone who was confirmed or suspected to have Conoravirus / COVID-19?  No  Do you have any of the following symptoms developed in the last 30 days? Fever: No Cough: No Shortness of breath: No  When did your symptoms start?  Within the last 2-3 days. Nausea happens after she takes her medication. Fatigue/Weakness is continuous.  If the patient has a fever, what is the last reading?  (use n/a if patient denies fever)  N/A  TRIAGE :: REMIND PATIENT TO SELF-ISOLATE WHILE THEY'RE MESSAGE IS BEING HANDLED  Beth - please advise as you were the last one to see the pt. Thanks!

## 2018-11-23 NOTE — Telephone Encounter (Signed)
Spoke with pt. She is aware of Beth's response. Nothing further was needed at this time.

## 2018-11-23 NOTE — Telephone Encounter (Signed)
I am glad she is feeling better. Make sure she is taking medication with food. She can stop prednisone if breathing is better. Continue to monitor temperature. Eat bland food and stay well hydrated. If symptoms persistent follow up with PCP, may not be related to medication.

## 2018-11-24 ENCOUNTER — Inpatient Hospital Stay (HOSPITAL_COMMUNITY): Payer: Medicare Other

## 2018-11-24 ENCOUNTER — Encounter (HOSPITAL_COMMUNITY): Payer: Self-pay

## 2018-11-24 ENCOUNTER — Other Ambulatory Visit: Payer: Self-pay

## 2018-11-24 ENCOUNTER — Inpatient Hospital Stay (HOSPITAL_COMMUNITY)
Admission: EM | Admit: 2018-11-24 | Discharge: 2018-12-05 | DRG: 208 | Disposition: A | Discharge status: Hospice | Payer: Medicare Other | Attending: Internal Medicine | Admitting: Internal Medicine

## 2018-11-24 ENCOUNTER — Emergency Department (HOSPITAL_COMMUNITY): Payer: Medicare Other

## 2018-11-24 DIAGNOSIS — Z7189 Other specified counseling: Secondary | ICD-10-CM | POA: Diagnosis not present

## 2018-11-24 DIAGNOSIS — Z888 Allergy status to other drugs, medicaments and biological substances status: Secondary | ICD-10-CM

## 2018-11-24 DIAGNOSIS — R933 Abnormal findings on diagnostic imaging of other parts of digestive tract: Secondary | ICD-10-CM | POA: Diagnosis not present

## 2018-11-24 DIAGNOSIS — R5381 Other malaise: Secondary | ICD-10-CM | POA: Diagnosis not present

## 2018-11-24 DIAGNOSIS — I2510 Atherosclerotic heart disease of native coronary artery without angina pectoris: Secondary | ICD-10-CM | POA: Diagnosis present

## 2018-11-24 DIAGNOSIS — Z85118 Personal history of other malignant neoplasm of bronchus and lung: Secondary | ICD-10-CM

## 2018-11-24 DIAGNOSIS — J449 Chronic obstructive pulmonary disease, unspecified: Secondary | ICD-10-CM | POA: Diagnosis present

## 2018-11-24 DIAGNOSIS — Z7951 Long term (current) use of inhaled steroids: Secondary | ICD-10-CM

## 2018-11-24 DIAGNOSIS — J9622 Acute and chronic respiratory failure with hypercapnia: Secondary | ICD-10-CM | POA: Diagnosis not present

## 2018-11-24 DIAGNOSIS — R54 Age-related physical debility: Secondary | ICD-10-CM | POA: Diagnosis present

## 2018-11-24 DIAGNOSIS — F419 Anxiety disorder, unspecified: Secondary | ICD-10-CM | POA: Diagnosis present

## 2018-11-24 DIAGNOSIS — J42 Unspecified chronic bronchitis: Secondary | ICD-10-CM | POA: Diagnosis not present

## 2018-11-24 DIAGNOSIS — Z91040 Latex allergy status: Secondary | ICD-10-CM

## 2018-11-24 DIAGNOSIS — Z8261 Family history of arthritis: Secondary | ICD-10-CM

## 2018-11-24 DIAGNOSIS — M1990 Unspecified osteoarthritis, unspecified site: Secondary | ICD-10-CM | POA: Diagnosis present

## 2018-11-24 DIAGNOSIS — R34 Anuria and oliguria: Secondary | ICD-10-CM | POA: Diagnosis not present

## 2018-11-24 DIAGNOSIS — Z20828 Contact with and (suspected) exposure to other viral communicable diseases: Secondary | ICD-10-CM | POA: Diagnosis present

## 2018-11-24 DIAGNOSIS — T380X5A Adverse effect of glucocorticoids and synthetic analogues, initial encounter: Secondary | ICD-10-CM | POA: Diagnosis not present

## 2018-11-24 DIAGNOSIS — Z4682 Encounter for fitting and adjustment of non-vascular catheter: Secondary | ICD-10-CM | POA: Diagnosis not present

## 2018-11-24 DIAGNOSIS — Z79899 Other long term (current) drug therapy: Secondary | ICD-10-CM

## 2018-11-24 DIAGNOSIS — J80 Acute respiratory distress syndrome: Secondary | ICD-10-CM | POA: Diagnosis not present

## 2018-11-24 DIAGNOSIS — G8929 Other chronic pain: Secondary | ICD-10-CM | POA: Diagnosis present

## 2018-11-24 DIAGNOSIS — K219 Gastro-esophageal reflux disease without esophagitis: Secondary | ICD-10-CM | POA: Diagnosis present

## 2018-11-24 DIAGNOSIS — Z8701 Personal history of pneumonia (recurrent): Secondary | ICD-10-CM

## 2018-11-24 DIAGNOSIS — Z803 Family history of malignant neoplasm of breast: Secondary | ICD-10-CM

## 2018-11-24 DIAGNOSIS — J9690 Respiratory failure, unspecified, unspecified whether with hypoxia or hypercapnia: Secondary | ICD-10-CM | POA: Diagnosis present

## 2018-11-24 DIAGNOSIS — Z90710 Acquired absence of both cervix and uterus: Secondary | ICD-10-CM

## 2018-11-24 DIAGNOSIS — Z7401 Bed confinement status: Secondary | ICD-10-CM | POA: Diagnosis not present

## 2018-11-24 DIAGNOSIS — Z0189 Encounter for other specified special examinations: Secondary | ICD-10-CM

## 2018-11-24 DIAGNOSIS — Z853 Personal history of malignant neoplasm of breast: Secondary | ICD-10-CM

## 2018-11-24 DIAGNOSIS — Z808 Family history of malignant neoplasm of other organs or systems: Secondary | ICD-10-CM

## 2018-11-24 DIAGNOSIS — Z8719 Personal history of other diseases of the digestive system: Secondary | ICD-10-CM

## 2018-11-24 DIAGNOSIS — Z886 Allergy status to analgesic agent status: Secondary | ICD-10-CM

## 2018-11-24 DIAGNOSIS — J9620 Acute and chronic respiratory failure, unspecified whether with hypoxia or hypercapnia: Secondary | ICD-10-CM | POA: Diagnosis not present

## 2018-11-24 DIAGNOSIS — Z66 Do not resuscitate: Secondary | ICD-10-CM | POA: Diagnosis not present

## 2018-11-24 DIAGNOSIS — R918 Other nonspecific abnormal finding of lung field: Secondary | ICD-10-CM | POA: Diagnosis not present

## 2018-11-24 DIAGNOSIS — Z8711 Personal history of peptic ulcer disease: Secondary | ICD-10-CM

## 2018-11-24 DIAGNOSIS — G9341 Metabolic encephalopathy: Secondary | ICD-10-CM | POA: Diagnosis present

## 2018-11-24 DIAGNOSIS — R042 Hemoptysis: Secondary | ICD-10-CM | POA: Diagnosis not present

## 2018-11-24 DIAGNOSIS — Z6824 Body mass index (BMI) 24.0-24.9, adult: Secondary | ICD-10-CM | POA: Diagnosis not present

## 2018-11-24 DIAGNOSIS — J441 Chronic obstructive pulmonary disease with (acute) exacerbation: Secondary | ICD-10-CM | POA: Diagnosis not present

## 2018-11-24 DIAGNOSIS — Z515 Encounter for palliative care: Secondary | ICD-10-CM

## 2018-11-24 DIAGNOSIS — Z741 Need for assistance with personal care: Secondary | ICD-10-CM | POA: Diagnosis not present

## 2018-11-24 DIAGNOSIS — H409 Unspecified glaucoma: Secondary | ICD-10-CM | POA: Diagnosis not present

## 2018-11-24 DIAGNOSIS — I959 Hypotension, unspecified: Secondary | ICD-10-CM | POA: Diagnosis present

## 2018-11-24 DIAGNOSIS — J9621 Acute and chronic respiratory failure with hypoxia: Secondary | ICD-10-CM | POA: Diagnosis not present

## 2018-11-24 DIAGNOSIS — H9190 Unspecified hearing loss, unspecified ear: Secondary | ICD-10-CM | POA: Diagnosis present

## 2018-11-24 DIAGNOSIS — I10 Essential (primary) hypertension: Secondary | ICD-10-CM | POA: Diagnosis present

## 2018-11-24 DIAGNOSIS — Z9981 Dependence on supplemental oxygen: Secondary | ICD-10-CM | POA: Diagnosis not present

## 2018-11-24 DIAGNOSIS — J439 Emphysema, unspecified: Secondary | ICD-10-CM | POA: Diagnosis present

## 2018-11-24 DIAGNOSIS — Z923 Personal history of irradiation: Secondary | ICD-10-CM

## 2018-11-24 DIAGNOSIS — Z87891 Personal history of nicotine dependence: Secondary | ICD-10-CM | POA: Diagnosis not present

## 2018-11-24 DIAGNOSIS — R739 Hyperglycemia, unspecified: Secondary | ICD-10-CM | POA: Diagnosis present

## 2018-11-24 DIAGNOSIS — M810 Age-related osteoporosis without current pathological fracture: Secondary | ICD-10-CM | POA: Diagnosis present

## 2018-11-24 DIAGNOSIS — Z9189 Other specified personal risk factors, not elsewhere classified: Secondary | ICD-10-CM

## 2018-11-24 DIAGNOSIS — Z885 Allergy status to narcotic agent status: Secondary | ICD-10-CM

## 2018-11-24 DIAGNOSIS — E785 Hyperlipidemia, unspecified: Secondary | ICD-10-CM | POA: Diagnosis present

## 2018-11-24 DIAGNOSIS — R0602 Shortness of breath: Secondary | ICD-10-CM

## 2018-11-24 DIAGNOSIS — J189 Pneumonia, unspecified organism: Secondary | ICD-10-CM | POA: Diagnosis present

## 2018-11-24 DIAGNOSIS — J90 Pleural effusion, not elsewhere classified: Secondary | ICD-10-CM | POA: Diagnosis not present

## 2018-11-24 DIAGNOSIS — M2550 Pain in unspecified joint: Secondary | ICD-10-CM | POA: Diagnosis not present

## 2018-11-24 DIAGNOSIS — J181 Lobar pneumonia, unspecified organism: Secondary | ICD-10-CM | POA: Diagnosis not present

## 2018-11-24 DIAGNOSIS — Z8249 Family history of ischemic heart disease and other diseases of the circulatory system: Secondary | ICD-10-CM

## 2018-11-24 DIAGNOSIS — R52 Pain, unspecified: Secondary | ICD-10-CM | POA: Diagnosis not present

## 2018-11-24 DIAGNOSIS — R069 Unspecified abnormalities of breathing: Secondary | ICD-10-CM | POA: Diagnosis not present

## 2018-11-24 DIAGNOSIS — Z8041 Family history of malignant neoplasm of ovary: Secondary | ICD-10-CM

## 2018-11-24 DIAGNOSIS — Z825 Family history of asthma and other chronic lower respiratory diseases: Secondary | ICD-10-CM

## 2018-11-24 LAB — PHOSPHORUS
Phosphorus: 3.2 mg/dL (ref 2.5–4.6)
Phosphorus: 2.3 mg/dL — ABNORMAL LOW (ref 2.5–4.6)

## 2018-11-24 LAB — GLUCOSE, CAPILLARY
Glucose-Capillary: 204 mg/dL — ABNORMAL HIGH (ref 70–99)
Glucose-Capillary: 232 mg/dL — ABNORMAL HIGH (ref 70–99)
Glucose-Capillary: 235 mg/dL — ABNORMAL HIGH (ref 70–99)

## 2018-11-24 LAB — COMPREHENSIVE METABOLIC PANEL
ALT: 124 U/L — ABNORMAL HIGH (ref 0–44)
AST: 28 U/L (ref 15–41)
Albumin: 2.9 g/dL — ABNORMAL LOW (ref 3.5–5.0)
Alkaline Phosphatase: 100 U/L (ref 38–126)
Anion gap: 13 (ref 5–15)
BUN: 10 mg/dL (ref 8–23)
CO2: 33 mmol/L — ABNORMAL HIGH (ref 22–32)
Calcium: 9.6 mg/dL (ref 8.9–10.3)
Chloride: 88 mmol/L — ABNORMAL LOW (ref 98–111)
Creatinine, Ser: 0.35 mg/dL — ABNORMAL LOW (ref 0.44–1.00)
GFR calc Af Amer: >60 mL/min (ref >60)
GFR calc non Af Amer: >60 mL/min (ref >60)
Glucose, Bld: 186 mg/dL — ABNORMAL HIGH (ref 70–99)
Potassium: 4.3 mmol/L (ref 3.5–5.1)
Sodium: 134 mmol/L — ABNORMAL LOW (ref 135–145)
Total Bilirubin: 1 mg/dL (ref 0.3–1.2)
Total Protein: 7 g/dL (ref 6.5–8.1)

## 2018-11-24 LAB — CBC WITH DIFFERENTIAL/PLATELET
Abs Immature Granulocytes: 0.27 10*3/uL — ABNORMAL HIGH (ref 0.00–0.07)
Basophils Absolute: 0.1 10*3/uL (ref 0.0–0.1)
Basophils Relative: 0 %
Eosinophils Absolute: 0 10*3/uL (ref 0.0–0.5)
Eosinophils Relative: 0 %
HCT: 40.4 % (ref 36.0–46.0)
Hemoglobin: 12.1 g/dL (ref 12.0–15.0)
Immature Granulocytes: 1 %
Lymphocytes Relative: 5 %
Lymphs Abs: 1.3 10*3/uL (ref 0.7–4.0)
MCH: 27 pg (ref 26.0–34.0)
MCHC: 30 g/dL (ref 30.0–36.0)
MCV: 90.2 fL (ref 80.0–100.0)
Monocytes Absolute: 1.8 10*3/uL — ABNORMAL HIGH (ref 0.1–1.0)
Monocytes Relative: 7 %
Neutro Abs: 22.9 10*3/uL — ABNORMAL HIGH (ref 1.7–7.7)
Neutrophils Relative %: 87 %
Platelets: 370 10*3/uL (ref 150–400)
RBC: 4.48 MIL/uL (ref 3.87–5.11)
RDW: 13.6 % (ref 11.5–15.5)
WBC: 26.4 10*3/uL — ABNORMAL HIGH (ref 4.0–10.5)
nRBC: 0 % (ref 0.0–0.2)

## 2018-11-24 LAB — BLOOD GAS, ARTERIAL
Acid-Base Excess: 6.9 mmol/L — ABNORMAL HIGH (ref 0.0–2.0)
Acid-Base Excess: 4.4 mmol/L — ABNORMAL HIGH (ref 0.0–2.0)
Bicarbonate: 35.8 mmol/L — ABNORMAL HIGH (ref 20.0–28.0)
Bicarbonate: 34.4 mmol/L — ABNORMAL HIGH (ref 20.0–28.0)
Drawn by: 422461
Drawn by: 422461
FIO2: 100
FIO2: 70
MECHVT: 320 mL
O2 Saturation: 99.1 %
O2 Saturation: 97.9 %
PEEP: 5 cmH2O
Patient temperature: 98.6
Patient temperature: 98.6
RATE: 22 resp/min
pCO2 arterial: 78.8 mmHg (ref 32.0–48.0)
pCO2 arterial: 91.3 mmHg (ref 32.0–48.0)
pH, Arterial: 7.28 — ABNORMAL LOW (ref 7.350–7.450)
pH, Arterial: 7.201 — ABNORMAL LOW (ref 7.350–7.450)
pO2, Arterial: 290 mmHg — ABNORMAL HIGH (ref 83.0–108.0)
pO2, Arterial: 142 mmHg — ABNORMAL HIGH (ref 83.0–108.0)

## 2018-11-24 LAB — MAGNESIUM
Magnesium: 2.5 mg/dL — ABNORMAL HIGH (ref 1.7–2.4)
Magnesium: 2.4 mg/dL (ref 1.7–2.4)

## 2018-11-24 LAB — BRAIN NATRIURETIC PEPTIDE: B Natriuretic Peptide: 119 pg/mL — ABNORMAL HIGH (ref 0.0–100.0)

## 2018-11-24 LAB — MRSA PCR SCREENING: MRSA by PCR: NEGATIVE

## 2018-11-24 LAB — SARS CORONAVIRUS 2 BY RT PCR (HOSPITAL ORDER, PERFORMED IN ~~LOC~~ HOSPITAL LAB): SARS Coronavirus 2: NEGATIVE

## 2018-11-24 LAB — TROPONIN I: Troponin I: 0.05 ng/mL (ref <0.03)

## 2018-11-24 MED ORDER — NOREPINEPHRINE 4 MG/250ML-% IV SOLN
0.0000 ug/min | INTRAVENOUS | Status: DC
  Administered 2018-11-24: 7 ug/min via INTRAVENOUS
  Administered 2018-11-24: 2 ug/min via INTRAVENOUS
  Filled 2018-11-24 (×2): qty 250

## 2018-11-24 MED ORDER — INSULIN ASPART 100 UNIT/ML ~~LOC~~ SOLN
0.0000 [IU] | SUBCUTANEOUS | Status: DC
  Administered 2018-11-24: 3 [IU] via SUBCUTANEOUS
  Administered 2018-11-25 (×5): 2 [IU] via SUBCUTANEOUS
  Administered 2018-11-25: 1 [IU] via SUBCUTANEOUS
  Administered 2018-11-26: 2 [IU] via SUBCUTANEOUS
  Administered 2018-11-26 – 2018-11-27 (×3): 1 [IU] via SUBCUTANEOUS
  Administered 2018-11-27: 2 [IU] via SUBCUTANEOUS
  Administered 2018-11-27 – 2018-11-28 (×2): 1 [IU] via SUBCUTANEOUS

## 2018-11-24 MED ORDER — MONTELUKAST SODIUM 10 MG PO TABS: 10.0000 mg | ORAL_TABLET | Freq: Every day | ORAL | Status: DC

## 2018-11-24 MED ORDER — SUCCINYLCHOLINE CHLORIDE 20 MG/ML IJ SOLN
80.0000 mg | Freq: Once | INTRAMUSCULAR | Status: AC
  Administered 2018-11-24: 80 mg via INTRAVENOUS
  Filled 2018-11-24: qty 4

## 2018-11-24 MED ORDER — VITAL HIGH PROTEIN PO LIQD: 1000.0000 mL | ORAL | Status: DC

## 2018-11-24 MED ORDER — PANTOPRAZOLE SODIUM 40 MG PO PACK
40.0000 mg | PACK | ORAL | Status: DC
  Administered 2018-11-24 – 2018-11-25 (×2): 40 mg
  Filled 2018-11-24 (×2): qty 20

## 2018-11-24 MED ORDER — BISACODYL 10 MG RE SUPP: 10.0000 mg | Freq: Every day | RECTAL | Status: DC | PRN

## 2018-11-24 MED ORDER — PIPERACILLIN-TAZOBACTAM 3.375 G IVPB 30 MIN
3.3750 g | Freq: Once | INTRAVENOUS | Status: AC
  Administered 2018-11-24: 3.375 g via INTRAVENOUS
  Filled 2018-11-24: qty 50

## 2018-11-24 MED ORDER — FENTANYL CITRATE (PF) 100 MCG/2ML IJ SOLN: 50.0000 ug | INTRAMUSCULAR | Status: DC | PRN

## 2018-11-24 MED ORDER — VANCOMYCIN HCL 10 G IV SOLR: 20.0000 mg/kg | Freq: Once | INTRAVENOUS | Status: DC

## 2018-11-24 MED ORDER — FLUTICASONE PROPIONATE 50 MCG/ACT NA SUSP
2.0000 | Freq: Every day | NASAL | Status: DC
  Administered 2018-11-26 – 2018-12-05 (×9): 2 via NASAL
  Filled 2018-11-24: qty 16

## 2018-11-24 MED ORDER — CHLORHEXIDINE GLUCONATE CLOTH 2 % EX PADS
6.0000 | MEDICATED_PAD | Freq: Every day | CUTANEOUS | Status: DC
  Administered 2018-11-24 – 2018-11-30 (×7): 6 via TOPICAL

## 2018-11-24 MED ORDER — ROSUVASTATIN CALCIUM 10 MG PO TABS
10.0000 mg | ORAL_TABLET | ORAL | Status: DC
  Administered 2018-11-24 – 2018-12-04 (×5): 10 mg
  Filled 2018-11-24 (×6): qty 1

## 2018-11-24 MED ORDER — METHYLPREDNISOLONE SODIUM SUCC 40 MG IJ SOLR
20.0000 mg | Freq: Four times a day (QID) | INTRAMUSCULAR | Status: DC
  Administered 2018-11-24 – 2018-11-28 (×17): 20 mg via INTRAVENOUS
  Filled 2018-11-24 (×16): qty 1

## 2018-11-24 MED ORDER — PROPOFOL 1000 MG/100ML IV EMUL
5.0000 ug/kg/min | INTRAVENOUS | Status: DC
  Administered 2018-11-24: 5 ug/kg/min via INTRAVENOUS
  Filled 2018-11-24: qty 100

## 2018-11-24 MED ORDER — ENOXAPARIN SODIUM 40 MG/0.4ML ~~LOC~~ SOLN
40.0000 mg | SUBCUTANEOUS | Status: DC
  Administered 2018-11-24 – 2018-11-30 (×7): 40 mg via SUBCUTANEOUS
  Filled 2018-11-24 (×7): qty 0.4

## 2018-11-24 MED ORDER — NOREPINEPHRINE BITARTRATE 1 MG/ML IV SOLN
0.0000 ug/min | INTRAVENOUS | Status: DC
  Filled 2018-11-24: qty 4

## 2018-11-24 MED ORDER — CHLORHEXIDINE GLUCONATE 0.12% ORAL RINSE (MEDLINE KIT): 15.0000 mL | Freq: Two times a day (BID) | OROMUCOSAL | Status: DC

## 2018-11-24 MED ORDER — DEXMEDETOMIDINE HCL IN NACL 200 MCG/50ML IV SOLN
0.0000 ug/kg/h | INTRAVENOUS | Status: AC
  Administered 2018-11-24: 0.4 ug/kg/h via INTRAVENOUS
  Administered 2018-11-24: 0.6 ug/kg/h via INTRAVENOUS
  Administered 2018-11-25: 1.1 ug/kg/h via INTRAVENOUS
  Administered 2018-11-25: 1 ug/kg/h via INTRAVENOUS
  Administered 2018-11-25: 0.4 ug/kg/h via INTRAVENOUS
  Administered 2018-11-25: 0.6 ug/kg/h via INTRAVENOUS
  Administered 2018-11-26: 1.1 ug/kg/h via INTRAVENOUS
  Administered 2018-11-26: 1 ug/kg/h via INTRAVENOUS
  Filled 2018-11-24 (×11): qty 50

## 2018-11-24 MED ORDER — IPRATROPIUM-ALBUTEROL 0.5-2.5 (3) MG/3ML IN SOLN
3.0000 mL | Freq: Four times a day (QID) | RESPIRATORY_TRACT | Status: DC
  Administered 2018-11-24 – 2018-12-01 (×28): 3 mL via RESPIRATORY_TRACT
  Filled 2018-11-24 (×30): qty 3

## 2018-11-24 MED ORDER — FENTANYL CITRATE (PF) 100 MCG/2ML IJ SOLN
50.0000 ug | INTRAMUSCULAR | Status: DC | PRN
  Administered 2018-11-24: 50 ug via INTRAVENOUS
  Filled 2018-11-24: qty 2

## 2018-11-24 MED ORDER — VANCOMYCIN HCL IN DEXTROSE 1-5 GM/200ML-% IV SOLN
1000.0000 mg | Freq: Once | INTRAVENOUS | Status: AC
  Administered 2018-11-24: 1000 mg via INTRAVENOUS
  Filled 2018-11-24: qty 200

## 2018-11-24 MED ORDER — PRO-STAT SUGAR FREE PO LIQD
30.0000 mL | Freq: Two times a day (BID) | ORAL | Status: DC
  Filled 2018-11-24: qty 30

## 2018-11-24 MED ORDER — MIDAZOLAM HCL 2 MG/2ML IJ SOLN
1.0000 mg | INTRAMUSCULAR | Status: DC | PRN
  Administered 2018-11-24: 1 mg via INTRAVENOUS
  Administered 2018-11-25: 2 mg via INTRAVENOUS
  Filled 2018-11-24 (×3): qty 2

## 2018-11-24 MED ORDER — PRO-STAT SUGAR FREE PO LIQD
30.0000 mL | Freq: Every day | ORAL | Status: DC
  Administered 2018-11-25 – 2018-11-26 (×2): 30 mL
  Filled 2018-11-24 (×3): qty 30

## 2018-11-24 MED ORDER — ALBUTEROL SULFATE (2.5 MG/3ML) 0.083% IN NEBU: 2.5000 mg | INHALATION_SOLUTION | RESPIRATORY_TRACT | Status: DC | PRN

## 2018-11-24 MED ORDER — PIPERACILLIN-TAZOBACTAM 3.375 G IVPB
3.3750 g | Freq: Three times a day (TID) | INTRAVENOUS | Status: AC
  Administered 2018-11-24 – 2018-11-29 (×17): 3.375 g via INTRAVENOUS
  Filled 2018-11-24 (×17): qty 50

## 2018-11-24 MED ORDER — VITAL AF 1.2 CAL PO LIQD
1000.0000 mL | ORAL | Status: DC
  Administered 2018-11-24 – 2018-11-25 (×2): 1000 mL

## 2018-11-24 MED ORDER — ORAL CARE MOUTH RINSE
15.0000 mL | OROMUCOSAL | Status: DC
  Administered 2018-11-24 – 2018-11-27 (×26): 15 mL via OROMUCOSAL

## 2018-11-24 MED ORDER — ROSUVASTATIN CALCIUM 10 MG PO TABS: 10.0000 mg | ORAL_TABLET | ORAL | Status: DC

## 2018-11-24 MED ORDER — ENOXAPARIN SODIUM 40 MG/0.4ML ~~LOC~~ SOLN
40.0000 mg | SUBCUTANEOUS | Status: DC
  Filled 2018-11-24: qty 0.4

## 2018-11-24 MED ORDER — LORATADINE 10 MG PO TABS: 10.0000 mg | ORAL_TABLET | Freq: Every day | ORAL | Status: DC

## 2018-11-24 MED ORDER — SUCCINYLCHOLINE CHLORIDE 20 MG/ML IJ SOLN
INTRAMUSCULAR | Status: AC | PRN
  Administered 2018-11-24: 80 mg via INTRAVENOUS

## 2018-11-24 MED ORDER — MONTELUKAST SODIUM 10 MG PO TABS
10.0000 mg | ORAL_TABLET | Freq: Every day | ORAL | Status: DC
  Administered 2018-11-24 – 2018-12-04 (×8): 10 mg
  Filled 2018-11-24 (×10): qty 1

## 2018-11-24 MED ORDER — FENTANYL CITRATE (PF) 100 MCG/2ML IJ SOLN
25.0000 ug | INTRAMUSCULAR | Status: DC | PRN
  Administered 2018-11-24: 100 ug via INTRAVENOUS
  Administered 2018-11-24: 75 ug via INTRAVENOUS
  Administered 2018-11-24 – 2018-11-26 (×8): 100 ug via INTRAVENOUS
  Filled 2018-11-24 (×10): qty 2

## 2018-11-24 MED ORDER — DOCUSATE SODIUM 50 MG/5ML PO LIQD
100.0000 mg | Freq: Two times a day (BID) | ORAL | Status: DC | PRN
  Filled 2018-11-24: qty 10

## 2018-11-24 MED ORDER — VANCOMYCIN HCL 500 MG IV SOLR
500.0000 mg | INTRAVENOUS | Status: DC
  Administered 2018-11-25: 500 mg via INTRAVENOUS
  Filled 2018-11-24: qty 500

## 2018-11-24 MED ORDER — KETAMINE HCL 50 MG/5ML IJ SOSY
100.0000 mg | PREFILLED_SYRINGE | Freq: Once | INTRAMUSCULAR | Status: AC
  Administered 2018-11-24: 100 mg via INTRAVENOUS
  Filled 2018-11-24: qty 10

## 2018-11-24 MED ORDER — FENTANYL CITRATE (PF) 100 MCG/2ML IJ SOLN
100.0000 ug | Freq: Once | INTRAMUSCULAR | Status: AC
  Administered 2018-11-24: 100 ug via INTRAVENOUS
  Filled 2018-11-24: qty 2

## 2018-11-24 MED ORDER — KETAMINE HCL 50 MG/5ML IJ SOSY
PREFILLED_SYRINGE | INTRAMUSCULAR | Status: AC
  Filled 2018-11-24: qty 5

## 2018-11-24 MED ORDER — ORAL CARE MOUTH RINSE: 15.0000 mL | OROMUCOSAL | Status: DC

## 2018-11-24 MED ORDER — KETAMINE HCL 10 MG/ML IJ SOLN
INTRAMUSCULAR | Status: AC | PRN
  Administered 2018-11-24 (×2): 50 mg via INTRAVENOUS

## 2018-11-24 MED ORDER — SODIUM CHLORIDE 0.9 % IV SOLN
INTRAVENOUS | Status: DC
  Administered 2018-11-24 – 2018-12-03 (×3): via INTRAVENOUS

## 2018-11-24 MED ORDER — LORATADINE 10 MG PO TABS
10.0000 mg | ORAL_TABLET | Freq: Every day | ORAL | Status: DC
  Administered 2018-11-24 – 2018-11-30 (×5): 10 mg
  Filled 2018-11-24 (×5): qty 1

## 2018-11-24 MED ORDER — CHLORHEXIDINE GLUCONATE 0.12% ORAL RINSE (MEDLINE KIT)
15.0000 mL | Freq: Two times a day (BID) | OROMUCOSAL | Status: DC
  Administered 2018-11-24 – 2018-11-27 (×7): 15 mL via OROMUCOSAL

## 2018-11-24 NOTE — ED Notes (Addendum)
Notify Son, Jeneen Rinks [address] [place], [phone]

## 2018-11-24 NOTE — Progress Notes (Signed)
Initial Nutrition Assessment  INTERVENTION:   -Initiate Vital AF 1.2 @ 40 ml/hr via OGT -30 ml Prostat daily -This regimen provides 1252 kcal, 87g protein and 778 ml H2O.  NUTRITION DIAGNOSIS:   Inadequate oral intake related to inability to eat as evidenced by NPO status.  GOAL:   Patient will meet greater than or equal to 90% of their needs  MONITOR:   Vent status, Labs, Weight trends, TF tolerance, I & O's  REASON FOR ASSESSMENT:   Consult, Ventilator Enteral/tube feeding initiation and management  ASSESSMENT:   85 yo female former smoker with COPD exacerbation, acute on chronic hypercapnic/hypoxic respiratory failure from pneumonia after failed outpt therapy with prednisone.  **RD working remotely**  Patient is currently intubated on ventilator support MV: 6.7 L/min Temp (24hrs), Avg:97.4 F (36.3 C), Min:97.4 F (36.3 C), Max:97.4 F (36.3 C)  Pt intubated this early this morning. Pt with OGT. TF protocol has been ordered already, will change TF formula to Vital AF 1.2 to better meet estimated needs.  Per weight records, pt has lost 7 lbs since 1/28 (5% wt loss x 5 months, insignificant for time frame).  Medications reviewed. Labs reviewed: Low Mg, Na Phos WNL   NUTRITION - FOCUSED PHYSICAL EXAM:  Unable to perform per department requirement to work remotely.  Diet Order:   Diet Order            Diet NPO time specified  Diet effective now              EDUCATION NEEDS:   Not appropriate for education at this time  Skin:  Skin Assessment: Reviewed RN Assessment  Last BM:  PTA  Height:   Ht Readings from Last 1 Encounters:  11/24/18 4\' 10"  (1.473 m)    Weight:   Wt Readings from Last 1 Encounters:  11/24/18 51.7 kg    Ideal Body Weight:  44 kg  BMI:  Body mass index is 23.82 kg/m.  Estimated Nutritional Needs:   Kcal:  1100-1300  Protein:  75-85g  Fluid:  1.3L/day  Clayton Bibles, MS, RD, LDN [place] Dietitian Pager: [phone] After Hours Pager: [phone]

## 2018-11-24 NOTE — ED Triage Notes (Signed)
Per EMS - Pt coming from home with SOB x 5 days progressively worse. Normally on 3 L @ home, but as been at 4.5L this week. PCP called in prednisone that has not been helping. Tonight became unbearable. No chest pain n/v. HTN    202/95 108 HR  96% on NRB @ 12L 22 R  20g rt hand  125 solumedrol  2g mag drip

## 2018-11-24 NOTE — H&P (Signed)
NAME:  Heather Barnes, MRN:  [id number], DOB:  12/28/1933, LOS: 0 ADMISSION DATE:  11/24/2018, CONSULTATION DATE: 11/24/2018 REFERRING MD:  Dr. Tyrone Nine, ER, CHIEF COMPLAINT:  Short of breath   Brief History   85 yo female former smoker with COPD exacerbation, acute on chronic hypercapnic/hypoxic respiratory failure from pneumonia after failed outpt therapy with prednisone.  History of present illness   85 yo female was seen in pulmonary office on 11/13/18 with shortness of breath, nasal congestion, headache and low grade temperature up to 29F.  She was treated with prednisone.  She developed fatigue, nausea and weakness.  Her breathing symptoms got worse and she presented to ER.  ABG showed hypoxia and hypercapnia.  Tx with steroids, magnesium, and nebs.  Failed to improve and intubated.  CXR showed pneumonia and started on ABX.  COVID test negative.  Past Medical History  Severe COPD with emphysema, chronic hypoxic/hypercapnic respiratory failure on 3 liters supplemental oxygen, Lung cancer s/p XRT, Breast cancer  Significant Hospital Events   6/19 Admit  Consults:    Procedures:  ETT 6/19 >>  Significant Diagnostic Tests:    Micro Data:  COVID 6/19 >> negative Blood 6/19 >> Sputum 6/19 >> Pneumococcal Ag 6/19 >> Legionella Ag 6/19 >>  Antimicrobials:  Vancomycin 6/19 >> Zosyn 6/19 >>  Interim history/subjective:    Objective   Blood pressure (!) 105/38, pulse 70, temperature (!) 97.4 F (36.3 C), temperature source Oral, resp. rate 18, height 4\' 10"  (1.473 m), weight 51.7 kg, SpO2 98 %.    Vent Mode: PRVC FiO2 (%):  [70 %] 70 % Set Rate:  [22 bmp] 22 bmp Vt Set:  [320 mL] 320 mL PEEP:  [5 cmH20] 5 cmH20 Plateau Pressure:  [16 cmH20-17 cmH20] 16 cmH20   Intake/Output Summary (Last 24 hours) at 11/24/2018 0744 Last data filed at 11/24/2018 2122 Gross per 24 hour  Intake 36.97 ml  Output 100 ml  Net -63.03 ml   Filed Weights   11/24/18 0500  Weight: 51.7 kg    Examination:  General - sedated Eyes - pupils pinpoint and reactive ENT - ETT in place Cardiac - regular rate/rhythm, no murmur Chest - faint b/l crackles and wheeze Abdomen - soft, non tender, + bowel sounds Extremities - no cyanosis, clubbing, or edema Skin - no rashes Neuro - RASS -4  CXR (reviewed by me) - b/l ASD   Resolved Hospital Problem list     Assessment & Plan:   Acute on chronic hypoxic/hypercapnic respiratory failure from PNA and COPD exacerbation with hx of severe COPD with emphysema. Plan - full vent support - allow for permissive hypercapnia to avoid PEEPi - f/u CXR - day 1 of ABx - solumedrol 20 mg q6h - scheduled BDs - continue claritin, singulair, flonase  Hypotension likely from hypovolemia and sedation. Plan - limit sedation - continue IV fluids  Acute metabolic encephalopathy. Plan - RASS goal 0 to -1 - use prn versed, fentanyl - can add precedex if needed  Hx of hypertension, HLD. Plan - hold outpt lopressor - continue crestor   Best practice:  Diet: tube feeds DVT prophylaxis: Lovenox GI prophylaxis: Protonix Mobility: bed rest Code Status: full code Disposition: admit to ICU  Labs   CBC: Recent Labs  Lab 11/24/18 0452  WBC 26.4*  NEUTROABS 22.9*  HGB 12.1  HCT 40.4  MCV 90.2  PLT 482    Basic Metabolic Panel: Recent Labs  Lab 11/24/18 0452  NA 134*  K  4.3  CL 88*  CO2 33*  GLUCOSE 186*  BUN 10  CREATININE 0.35*  CALCIUM 9.6   GFR: Estimated Creatinine Clearance: 38 mL/min (A) (by C-G formula based on SCr of 0.35 mg/dL (L)). Recent Labs  Lab 11/24/18 0452  WBC 26.4*    Liver Function Tests: Recent Labs  Lab 11/24/18 0452  AST 28  ALT 124*  ALKPHOS 100  BILITOT 1.0  PROT 7.0  ALBUMIN 2.9*   No results for input(s): LIPASE, AMYLASE in the last 168 hours. No results for input(s): AMMONIA in the last 168 hours.  ABG    Component Value Date/Time   PHART 7.201 (L) 11/24/2018 0633   PCO2ART  91.3 (HH) 11/24/2018 0633   PO2ART 142 (H) 11/24/2018 0633   HCO3 34.4 (H) 11/24/2018 0633   TCO2 31 03/31/2016 0758   ACIDBASEDEF 5.2 (H) 11/14/2017 1040   O2SAT 97.9 11/24/2018 0633     Coagulation Profile: No results for input(s): INR, PROTIME in the last 168 hours.  Cardiac Enzymes: Recent Labs  Lab 11/24/18 0452  TROPONINI 0.05*    HbA1C: Hgb A1c MFr Bld  Date/Time Value Ref Range Status  06/11/2015 08:20 AM 5.8 (H) 4.8 - 5.6 % Final    Comment:    (NOTE)         Pre-diabetes: 5.7 - 6.4         Diabetes: >6.4         Glycemic control for adults with diabetes: <7.0     CBG: No results for input(s): GLUCAP in the last 168 hours.  Review of Systems:   Unable to obtain  Past Medical History  She,  has a past medical history of Arthritis, Asthma, Breast cancer, right breast ([place]), Chronic lower back pain, Colon adenomas, Complication of anesthesia, COPD (chronic obstructive pulmonary disease) ([place]), Coronary artery calcification (09/01/2016), Dysrhythmia, GERD (gastroesophageal reflux disease), Goiter, History of duodenal ulcer, History of radiation therapy (05/24/11-06/02/11), colonic polyps, Hyperlipidemia, Lung cancer, middle lobe ([place]) (04/26/2011), On home oxygen therapy, Osteoporosis, Osteoporosis, Palpitations (01/19/2016), Pneumonia (2017 X 2), Pollen allergy, PVC (premature ventricular contraction) (02/24/2016), Shortness of breath, and Wears glasses.   Surgical History    Past Surgical History:  Procedure Laterality Date  . APPENDECTOMY    . BIOPSY THYROID  2016  . BREAST BIOPSY Right 03/2017  . BREAST LUMPECTOMY WITH RADIOACTIVE SEED LOCALIZATION Right 04/11/2017  . BREAST LUMPECTOMY WITH RADIOACTIVE SEED LOCALIZATION Right 04/11/2017   Procedure: RIGHT BREAST LUMPECTOMY WITH RADIOACTIVE SEED LOCALIZATION;  Surgeon: Rolm Bookbinder, MD;  Location: [facility];  Service: General;  Laterality: Right;  . CATARACT EXTRACTION W/ INTRAOCULAR LENS  IMPLANT, BILATERAL  Bilateral   . COLONOSCOPY W/ BIOPSIES    . DILATION AND CURETTAGE OF UTERUS  "many"  . TONSILLECTOMY    . TOTAL ABDOMINAL HYSTERECTOMY       Social History   reports that she quit smoking about 20 years ago. Her smoking use included cigarettes. She has a 20.00 pack-year smoking history. She has never used smokeless tobacco. She reports that she does not drink alcohol or use drugs.   Family History   Her family history includes Brain cancer in her sister; Breast cancer in her daughter; Cancer in her sister, sister, and sister; Emphysema in her maternal uncle; Heart disease in her maternal grandfather and mother; Ovarian cancer in her sister; Rheum arthritis in her sister.   Allergies Allergies  Allergen Reactions  . Tiotropium Bromide Monohydrate Hives, Shortness Of Breath and Rash  Spiriva   . Codeine Other (See Comments)    Hyperactivity,crying  . Fosamax [Alendronate] Other (See Comments)    Muscle aches  . Asa [Aspirin] Other (See Comments)    Bleeding -ulcers  . Latex Rash     Home Medications  Prior to Admission medications   Medication Sig Start Date End Date Taking? Authorizing Provider  acetaminophen (TYLENOL) 325 MG tablet Take 650 mg by mouth every 4 (four) hours as needed.    [provider]  albuterol (PROVENTIL HFA;VENTOLIN HFA) 108 (90 Base) MCG/ACT inhaler Inhale 2 puffs into the lungs every 6 (six) hours as needed for wheezing or shortness of breath.    [provider]  azelastine (ASTELIN) 0.1 % nasal spray Place 2 sprays into both nostrils 2 (two) times daily. Use in each nostril as directed 11/13/18   Martyn Ehrich, NP  Calcium Carbonate-Vitamin D 600-200 MG-UNIT TABS Take 1 tablet by mouth daily.    [provider]  cetirizine (ZYRTEC) 10 MG tablet Take 10 mg by mouth daily.      [provider]  fluticasone (FLONASE) 50 MCG/ACT nasal spray Place 2 sprays into the nose daily as needed for allergies or rhinitis.      [provider]  metoprolol tartrate (LOPRESSOR) 25 MG tablet TAKE 1/2 (ONE-HALF) TABLET BY MOUTH TWICE DAILY 06/27/18   Skeet Latch, MD  Multiple Vitamins-Minerals (PRESERVISION AREDS 2) CAPS Take by mouth.    [provider]  Omega-3 Fatty Acids (FISH OIL) 1000 MG CAPS Take 1,000 mg by mouth at bedtime.     [provider]  OXYGEN Inhale into the lungs. 3L at rest and 4pulse with exertion    [provider]  rosuvastatin (CRESTOR) 10 MG tablet Take 10 mg by mouth every other day.     [provider]  SINGULAIR 10 MG tablet Take 10 mg by mouth at bedtime.  12/29/10   [provider]  Donnal Debar 100-62.5-25 MCG/INH AEPB Inhale 1 puff by mouth once daily 09/25/18   Brand Males, MD     Critical care time: 43 minutes    Chesley Mires, MD [place] 11/24/2018, 7:55 AM

## 2018-11-24 NOTE — ED Notes (Signed)
Soft restraints applied to bilateral wrists.

## 2018-11-24 NOTE — ED Provider Notes (Signed)
[facility] DEPT Provider Note   CSN: [id number] Arrival date & time: 11/24/18  0420    History   Chief Complaint Chief Complaint  Patient presents with   Shortness of Breath   COPD    HPI Heather Barnes is a 85 y.o. female.     85 yo F with a chief complaint of shortness of breath.  This is been ongoing for the past couple weeks.  She had had a couple virtual visits with her pulmonologist and is on her second course of steroids.  She had worsening over the course of the evening and eventually called 911.  She is chronically on 3 L of oxygen at all times but has been on 4-1/2 over the past week or so.  On arrival the patient is on a nonrebreather.  She was given duo nebs magnesium and Solu-Medrol in route.  Patient does not feel that they made much of a difference.  Level 5 caveat acuity of condition.  The history is provided by the patient.  Shortness of Breath Severity:  Severe Onset quality:  Gradual Duration:  2 weeks Timing:  Constant Progression:  Worsening Chronicity:  New Relieved by:  Nothing Worsened by:  Nothing Ineffective treatments:  None tried Associated symptoms: cough   Associated symptoms: no chest pain, no fever, no headaches, no vomiting and no wheezing   COPD Associated symptoms include shortness of breath. Pertinent negatives include no chest pain and no headaches.    Past Medical History:  Diagnosis Date   Arthritis    "joints" (04/11/2017)   Asthma    Breast cancer, right breast ([place])    S/P RIGHT BREAST LUMPECTOMY WITH RADIOACTIVE SEED LOCALIZATION 04/11/2017   Chronic lower back pain    "if I stand too long" (04/11/2017)   Colon adenomas    Complication of anesthesia    Must sit upright 45-60 degree angle per pulmonology   COPD (chronic obstructive pulmonary disease) ([place])    "severe" (04/11/2017)   Coronary artery calcification 09/01/2016   Dysrhythmia    bigeminy   GERD (gastroesophageal  reflux disease)    Goiter    History of duodenal ulcer    History of radiation therapy 05/24/11-06/02/11   R middle lobe lung/ 60 gray   Hx of colonic polyps    Hyperlipidemia    Lung cancer, middle lobe ([place]) 04/26/2011   S/P radiation 05/24/11-06/02/11   On home oxygen therapy    "3L; all the time" (04/11/2017)   Osteoporosis    Osteoporosis    Palpitations 01/19/2016   a. event monitor in 01/2016 showed PVC's with one episode of ventricular quadrigeminy.   Pneumonia 2017 X 2   Pollen allergy    PVC (premature ventricular contraction) 02/24/2016   Shortness of breath    Wears glasses     Patient Active Problem List   Diagnosis Date Noted   Sinusitis 12/12/2017   Ductal carcinoma in situ (DCIS) of right breast 03/10/2017   Coronary artery calcification 09/01/2016   Bilateral low back pain with sciatica 05/10/2016   Bronchopneumonia 05/10/2016   CAP (community acquired pneumonia) 03/31/2016   Chest pain 03/31/2016   Tachycardia 03/31/2016   PVC (premature ventricular contraction) 02/24/2016   Palpitations 01/19/2016   Respiratory failure ([place]) 06/11/2015   Acute on chronic respiratory failure ([place]) 06/11/2015   Knee pain, right 07/02/2014   Chronic respiratory failure with hypoxia ([place]) 07/02/2014   COPD, very severe ([place]) 07/02/2014   COPD exacerbation ([place]) 06/13/2013  Right rib fracture 05/15/2012   Fatigue 07/22/2011   Oral thrush 07/22/2011   Lung cancer, middle lobe ([place]) 04/26/2011   COPD (chronic obstructive pulmonary disease) ([place]) 01/13/2011    Past Surgical History:  Procedure Laterality Date   APPENDECTOMY     BIOPSY THYROID  2016   BREAST BIOPSY Right 03/2017   BREAST LUMPECTOMY WITH RADIOACTIVE SEED LOCALIZATION Right 04/11/2017   BREAST LUMPECTOMY WITH RADIOACTIVE SEED LOCALIZATION Right 04/11/2017   Procedure: RIGHT BREAST LUMPECTOMY WITH RADIOACTIVE SEED LOCALIZATION;  Surgeon: Rolm Bookbinder, MD;   Location: [facility];  Service: General;  Laterality: Right;   CATARACT EXTRACTION W/ INTRAOCULAR LENS  IMPLANT, BILATERAL Bilateral    COLONOSCOPY W/ BIOPSIES     DILATION AND CURETTAGE OF UTERUS  "many"   TONSILLECTOMY     TOTAL ABDOMINAL HYSTERECTOMY       OB History   No obstetric history on file.      Home Medications    Prior to Admission medications   Medication Sig Start Date End Date Taking? Authorizing Provider  acetaminophen (TYLENOL) 325 MG tablet Take 650 mg by mouth every 4 (four) hours as needed.    [provider]  albuterol (PROVENTIL HFA;VENTOLIN HFA) 108 (90 Base) MCG/ACT inhaler Inhale 2 puffs into the lungs every 6 (six) hours as needed for wheezing or shortness of breath.    [provider]  azelastine (ASTELIN) 0.1 % nasal spray Place 2 sprays into both nostrils 2 (two) times daily. Use in each nostril as directed 11/13/18   Martyn Ehrich, NP  Calcium Carbonate-Vitamin D 600-200 MG-UNIT TABS Take 1 tablet by mouth daily.    [provider]  cetirizine (ZYRTEC) 10 MG tablet Take 10 mg by mouth daily.      [provider]  fluticasone (FLONASE) 50 MCG/ACT nasal spray Place 2 sprays into the nose daily as needed for allergies or rhinitis.     [provider]  metoprolol tartrate (LOPRESSOR) 25 MG tablet TAKE 1/2 (ONE-HALF) TABLET BY MOUTH TWICE DAILY 06/27/18   Skeet Latch, MD  Multiple Vitamins-Minerals (PRESERVISION AREDS 2) CAPS Take by mouth.    [provider]  Omega-3 Fatty Acids (FISH OIL) 1000 MG CAPS Take 1,000 mg by mouth at bedtime.     [provider]  OXYGEN Inhale into the lungs. 3L at rest and 4pulse with exertion    [provider]  predniSONE (DELTASONE) 10 MG tablet Take 4 tabs po daily x 3 days; then 3 tabs daily x3 days; then 2 tabs daily x3 days; then 1 tab daily x 3 days; then stop 11/13/18   Martyn Ehrich, NP  rosuvastatin (CRESTOR) 10 MG tablet Take 10 mg by  mouth every other day.     [provider]  SINGULAIR 10 MG tablet Take 10 mg by mouth at bedtime.  12/29/10   [provider]  Donnal Debar 100-62.5-25 MCG/INH AEPB Inhale 1 puff by mouth once daily 09/25/18   Brand Males, MD    Family History Family History  Problem Relation Age of Onset   Emphysema Maternal Uncle    Heart disease Mother    Heart disease Maternal Grandfather    Rheum arthritis Sister    Cancer Sister    Cancer Sister        throat cancer   Brain cancer Sister    Cancer Sister    Ovarian cancer Sister    Breast cancer Daughter     Social History Social  History   Tobacco Use   Smoking status: Former Smoker    Packs/day: 0.50    Years: 40.00    Pack years: 20.00    Types: Cigarettes    Quit date: 06/07/1998    Years since quitting: 20.4   Smokeless tobacco: Never Used  Substance Use Topics   Alcohol use: No   Drug use: No     Allergies   Tiotropium bromide monohydrate, Codeine, Fosamax [alendronate], Asa [aspirin], and Latex   Review of Systems Review of Systems  Constitutional: Negative for chills and fever.  HENT: Negative for congestion and rhinorrhea.   Eyes: Negative for redness and visual disturbance.  Respiratory: Positive for cough and shortness of breath. Negative for wheezing.   Cardiovascular: Negative for chest pain and palpitations.  Gastrointestinal: Negative for nausea and vomiting.  Genitourinary: Negative for dysuria and urgency.  Musculoskeletal: Negative for arthralgias and myalgias.  Skin: Negative for pallor and wound.  Neurological: Negative for dizziness and headaches.     Physical Exam Updated Vital Signs BP (!) 174/70 (BP Location: Left Arm)    Pulse (!) 102    Temp (!) 97.4 F (36.3 C) (Oral)    Resp (!) 22    Ht 4\' 10"  (1.473 m)    Wt 51.7 kg Comment: 10/16/2018   SpO2 98%    BMI 23.82 kg/m   Physical Exam Vitals signs and nursing note reviewed.  Constitutional:       General: She is not in acute distress.    Appearance: She is well-developed. She is ill-appearing. She is not diaphoretic.  HENT:     Head: Normocephalic and atraumatic.  Eyes:     Pupils: Pupils are equal, round, and reactive to light.  Neck:     Musculoskeletal: Normal range of motion and neck supple.  Cardiovascular:     Rate and Rhythm: Normal rate and regular rhythm.     Heart sounds: No murmur. No friction rub. No gallop.   Pulmonary:     Effort: Tachypnea and respiratory distress present.     Breath sounds: Examination of the left-upper field reveals decreased breath sounds. Examination of the left-middle field reveals decreased breath sounds. Examination of the left-lower field reveals decreased breath sounds. Decreased breath sounds present. No wheezing or rales.  Abdominal:     General: There is no distension.     Palpations: Abdomen is soft.     Tenderness: There is no abdominal tenderness.  Musculoskeletal:        General: No tenderness.  Skin:    General: Skin is warm and dry.  Neurological:     Mental Status: She is alert and oriented to person, place, and time.  Psychiatric:        Behavior: Behavior normal.      ED Treatments / Results  Labs (all labs ordered are listed, but only abnormal results are displayed) Labs Reviewed  CBC WITH DIFFERENTIAL/PLATELET - Abnormal; Notable for the following components:      Result Value   WBC 26.4 (*)    Neutro Abs 22.9 (*)    Monocytes Absolute 1.8 (*)    Abs Immature Granulocytes 0.27 (*)    All other components within normal limits  COMPREHENSIVE METABOLIC PANEL - Abnormal; Notable for the following components:   Sodium 134 (*)    Chloride 88 (*)    CO2 33 (*)    Glucose, Bld 186 (*)    Creatinine, Ser 0.35 (*)    Albumin 2.9 (*)  ALT 124 (*)    All other components within normal limits  TROPONIN I - Abnormal; Notable for the following components:   Troponin I 0.05 (*)    All other components within normal  limits  BRAIN NATRIURETIC PEPTIDE - Abnormal; Notable for the following components:   B Natriuretic Peptide 119.0 (*)    All other components within normal limits  BLOOD GAS, ARTERIAL - Abnormal; Notable for the following components:   pH, Arterial 7.280 (*)    pCO2 arterial 78.8 (*)    pO2, Arterial 290 (*)    Bicarbonate 35.8 (*)    Acid-Base Excess 6.9 (*)    All other components within normal limits  SARS CORONAVIRUS 2 (HOSPITAL ORDER, [facility])  CULTURE, BLOOD (ROUTINE X 2)  CULTURE, BLOOD (ROUTINE X 2)    EKG None  Radiology Dg Chest Port 1 View  Result Date: 11/24/2018 CLINICAL DATA:  Intubation. EXAM: PORTABLE CHEST 1 VIEW COMPARISON:  11/24/2018. FINDINGS: Endotracheal tube tip noted 1.6 cm above the carina. NG tube tip noted below left hemidiaphragm. Heart size stable. Persistent bilateral interstitial prominence and multifocal alveolar infiltrates. Multifocal infiltrates could be secondary to multifocal edema and or pneumonia. Small bilateral pleural effusions again noted. Similar findings noted on prior chest x-ray. No pneumothorax. Stable thoracic spine compression fractures. IMPRESSION: 1. Endotracheal tube noted 1.6 cm above the carina. NG tube noted with tip below left hemidiaphragm. 2. Heart size stable. Diffuse mild bilateral interstitial prominence again noted. Multifocal alveolar infiltrates again noted. These changes could be secondary to congestive heart failure with multifocal edema and/or multifocal pneumonia. Small bilateral pleural effusions again noted. Chest appears similar to prior exam. Electronically Signed   By: Marcello Moores  Register   On: 11/24/2018 06:22   Dg Chest Port 1 View  Result Date: 11/24/2018 CLINICAL DATA:  Shortness of breath. EXAM: PORTABLE CHEST 1 VIEW COMPARISON:  CT 04/20/2018.  Chest x-ray 10/31/2016. FINDINGS: Mediastinum hilar structures are unremarkable. Borderline cardiomegaly. Diffuse bilateral interstitial  prominence of small pleural effusions. CHF could present this fashion. Pneumonitis cannot be excluded. Right upper lobe, left perihilar, bibasilar alveolar infiltrates are present. This may be secondary to pulmonary edema. Multifocal pneumonia cannot be excluded. Degenerative change thoracic spine. Stable mid/lower thoracic vertebral body compression fractures. IMPRESSION: 1. Borderline cardiomegaly with bilateral from interstitial prominence and bilateral pleural effusions suggesting CHF. 2. Multifocal bilateral alveolar infiltrates. This could be secondary to pulmonary alveolar edema and or multifocal pneumonia. Electronically Signed   By: Marcello Moores  Register   On: 11/24/2018 05:36   Dg Abd Portable 1 View  Result Date: 11/24/2018 CLINICAL DATA:  OG tube placement. EXAM: PORTABLE ABDOMEN - 1 VIEW COMPARISON:  Chest x-ray 11/24/2018. FINDINGS: OG tube noted with tip coiled in stomach. No gastric or bowel distention. No free air. Diffuse degenerative change thoracolumbar spine with scoliosis. Stable thoracic spine compression fractures. IMPRESSION: OG tube noted with tip coiled stomach. No gastric or bowel distention. Electronically Signed   By: Marcello Moores  Register   On: 11/24/2018 06:23    Procedures Procedure Name: Intubation Date/Time: 11/24/2018 5:34 AM Performed by: Deno Etienne, DO Pre-anesthesia Checklist: Patient identified, Patient being monitored, Emergency Drugs available, Timeout performed and Suction available Oxygen Delivery Method: Non-rebreather mask Preoxygenation: Pre-oxygenation with 100% oxygen Induction Type: Rapid sequence Ventilation: Mask ventilation without difficulty Laryngoscope Size: Glidescope Grade View: Grade I Tube size: 7.5 mm Number of attempts: 1 Airway Equipment and Method: Video-laryngoscopy Placement Confirmation: ETT inserted through vocal cords under direct vision,  CO2 detector and Breath sounds checked- equal and bilateral Secured at: 23 cm Tube secured with:  ETT holder Dental Injury: Teeth and Oropharynx as per pre-operative assessment  Difficulty Due To: Difficulty was anticipated Future Recommendations: Recommend- induction with short-acting agent, and alternative techniques readily available      (including critical care time)  Medications Ordered in ED Medications  piperacillin-tazobactam (ZOSYN) IVPB 3.375 g (3.375 g Intravenous New Bag/Given 11/24/18 0623)  ketamine HCl 50 MG/5ML SOSY (has no administration in time range)  vancomycin (VANCOCIN) IVPB 1000 mg/200 mL premix (has no administration in time range)  propofol (DIPRIVAN) 1000 MG/100ML infusion (5 mcg/kg/min  51.7 kg Intravenous New Bag/Given 11/24/18 0545)  norepinephrine (LEVOPHED) 4 mg in dextrose 5 % 250 mL (0.016 mg/mL) infusion (has no administration in time range)  ketamine 50 mg in normal saline 5 mL (10 mg/mL) syringe (100 mg Intravenous Given 11/24/18 0520)  succinylcholine (ANECTINE) injection 80 mg (80 mg Intravenous Given 11/24/18 0520)  fentaNYL (SUBLIMAZE) injection 100 mcg (100 mcg Intravenous Given 11/24/18 0554)     Initial Impression / Assessment and Plan / ED Course  I have reviewed the triage vital signs and the nursing notes.  Pertinent labs & imaging results that were available during my care of the patient were reviewed by me and considered in my medical decision making (see chart for details).        85 yo F with a chief complaint of shortness of breath.  The patient is very ill-appearing on arrival and in respiratory distress.  I discussed with her at length about likely needing endotracheal intubation which the patient agrees.  ABG with hypercarbia which could be chronic for her, seems to be oxygenating well.  Chest x-ray viewed by me with diffuse scattered infiltrates.  Started on broad-spectrum antibiotics.  Troponins mildly elevated at 0.05.  Patient continued to have significant increased work of breathing and appeared to be exhausted.   Patient was intubated.  Will discuss with critical care.  Patient's blood pressure dipped into the 70s with very minimal amounts of propofol given.  Not sure of 5 mics of propofol could have caused this, will start on levofed.  CRITICAL CARE Performed by: Cecilio Asper   Total critical care time: 80 minutes  Critical care time was exclusive of separately billable procedures and treating other patients.  Critical care was necessary to treat or prevent imminent or life-threatening deterioration.  Critical care was time spent personally by me on the following activities: development of treatment plan with patient and/or surrogate as well as nursing, discussions with consultants, evaluation of patient's response to treatment, examination of patient, obtaining history from patient or surrogate, ordering and performing treatments and interventions, ordering and review of laboratory studies, ordering and review of radiographic studies, pulse oximetry and re-evaluation of patient's condition.  The patients results and plan were reviewed and discussed.   Any x-rays performed were independently reviewed by myself.   Differential diagnosis were considered with the presenting HPI.  Medications  piperacillin-tazobactam (ZOSYN) IVPB 3.375 g (3.375 g Intravenous New Bag/Given 11/24/18 0623)  ketamine HCl 50 MG/5ML SOSY (has no administration in time range)  vancomycin (VANCOCIN) IVPB 1000 mg/200 mL premix (has no administration in time range)  propofol (DIPRIVAN) 1000 MG/100ML infusion (5 mcg/kg/min  51.7 kg Intravenous New Bag/Given 11/24/18 0545)  norepinephrine (LEVOPHED) 4mg  in 219mL premix infusion (has no administration in time range)  ketamine 50 mg in normal saline 5 mL (10 mg/mL) syringe (100 mg Intravenous  Given 11/24/18 0520)  succinylcholine (ANECTINE) injection 80 mg (80 mg Intravenous Given 11/24/18 0520)  fentaNYL (SUBLIMAZE) injection 100 mcg (100 mcg Intravenous Given 11/24/18 0554)     Vitals:   11/24/18 0424 11/24/18 0431 11/24/18 0500  BP:  (!) 174/70   Pulse:  (!) 102   Resp:  (!) 26 (!) 22  Temp:  (!) 97.4 F (36.3 C)   TempSrc:  Oral   SpO2: 96% 98%   Weight:   51.7 kg  Height:   4\' 10"  (1.473 m)    Final diagnoses:  Multifocal pneumonia  Acute on chronic respiratory failure with hypoxia and hypercapnia (HCC)    Admission/ observation were discussed with the admitting physician, patient and/or family and they are comfortable with the plan.    Final Clinical Impressions(s) / ED Diagnoses   Final diagnoses:  Multifocal pneumonia  Acute on chronic respiratory failure with hypoxia and hypercapnia [facility])    ED Discharge Orders    None       Deno Etienne, DO 11/24/18 [phone]

## 2018-11-24 NOTE — ED Notes (Signed)
One set of blood cultures obtained from [facility], unable to obtain second set and did not want to delay ABX treatment.

## 2018-11-24 NOTE — ED Notes (Signed)
Bed: TC76 Expected date:  Expected time:  Means of arrival:  Comments: 9 F SOB

## 2018-11-24 NOTE — Progress Notes (Addendum)
Pharmacy Antibiotic Note  Heather Barnes is a 85 y.o. female admitted on 11/24/2018 with pneumonia. PMH significant for severe COPD.  Currently on steroids as outpatient for respiratory illness.  Her breathing worsened & she presented to ED.  Intubated.  CXR + PNA.  Pharmacy has been consulted for Vancomycin & Zosyn dosing.  Initial doses given in ED.   She is afebrile with elevated WBC/ANC.  Renal function at patient's baseline.  CBC WNL.  Plan:  Zosyn 3.375gm IV Q8h to be infused over 4hrs  Vancomycin 1gm IV load (already given) then Vancomycin 500mg  IV q24h (goal AUC 400-550)  Monitor renal function and cx data   Consider check MRSA PCR.  Lovenox 40mg  sq daily for VTE px.    Height: 4\' 10"  (147.3 cm) Weight: 113 lb 15.7 oz (51.7 kg)(10/16/2018) IBW/kg (Calculated) : 40.9  Temp (24hrs), Avg:97.4 F (36.3 C), Min:97.4 F (36.3 C), Max:97.4 F (36.3 C)  Recent Labs  Lab 11/24/18 0452  WBC 26.4*  CREATININE 0.35*    Estimated Creatinine Clearance: 38 mL/min (A) (by C-G formula based on SCr of 0.35 mg/dL (L)).    Allergies  Allergen Reactions  . Tiotropium Bromide Monohydrate Hives, Shortness Of Breath and Rash    Spiriva   . Codeine Other (See Comments)    Hyperactivity,crying  . Fosamax [Alendronate] Other (See Comments)    Muscle aches  . Asa [Aspirin] Other (See Comments)    Bleeding -ulcers  . Latex Rash    Antimicrobials this admission: 6/19 Vanc >>  6/19 Zosyn >>   Dose adjustments this admission:  Microbiology results: 6/19 BCx:  6/19 SARS CoV2: negative Resp Cx:  MRSA PCR:   Thank you for allowing pharmacy to be a part of this patient's care.  Biagio Borg 11/24/2018 8:33 AM

## 2018-11-24 NOTE — Progress Notes (Signed)
[facility] Progress Note Patient Name: Heather Barnes DOB: January 31, 1934 MRN: [id number]   Date of Service  11/24/2018  HPI/Events of Note  Hyperglycemia - Blood glucose = 232.   eICU Interventions  Will order: 1. Q 4 hour sensitive Novolog SSI.     Intervention Category Major Interventions: Hyperglycemia - active titration of insulin therapy  Lysle Dingwall 11/24/2018, 10:13 PM

## 2018-11-25 ENCOUNTER — Inpatient Hospital Stay (HOSPITAL_COMMUNITY): Payer: Medicare Other

## 2018-11-25 ENCOUNTER — Other Ambulatory Visit: Payer: Self-pay

## 2018-11-25 DIAGNOSIS — J449 Chronic obstructive pulmonary disease, unspecified: Secondary | ICD-10-CM

## 2018-11-25 LAB — BLOOD GAS, ARTERIAL
Acid-Base Excess: 8.7 mmol/L — ABNORMAL HIGH (ref 0.0–2.0)
Bicarbonate: 35.7 mmol/L — ABNORMAL HIGH (ref 20.0–28.0)
Drawn by: 336831
FIO2: 40
MECHVT: 320 mL
O2 Saturation: 92.7 %
PEEP: 5 cmH2O
Patient temperature: 37
RATE: 18 resp/min
pCO2 arterial: 66 mmHg (ref 32.0–48.0)
pH, Arterial: 7.352 (ref 7.350–7.450)
pO2, Arterial: 68.9 mmHg — ABNORMAL LOW (ref 83.0–108.0)

## 2018-11-25 LAB — CBC
HCT: 32.4 % — ABNORMAL LOW (ref 36.0–46.0)
Hemoglobin: 10 g/dL — ABNORMAL LOW (ref 12.0–15.0)
MCH: 27.5 pg (ref 26.0–34.0)
MCHC: 30.9 g/dL (ref 30.0–36.0)
MCV: 89 fL (ref 80.0–100.0)
Platelets: 290 10*3/uL (ref 150–400)
RBC: 3.64 MIL/uL — ABNORMAL LOW (ref 3.87–5.11)
RDW: 13.8 % (ref 11.5–15.5)
WBC: 28.5 10*3/uL — ABNORMAL HIGH (ref 4.0–10.5)
nRBC: 0 % (ref 0.0–0.2)

## 2018-11-25 LAB — BASIC METABOLIC PANEL
Anion gap: 10 (ref 5–15)
BUN: 16 mg/dL (ref 8–23)
CO2: 32 mmol/L (ref 22–32)
Calcium: 8.1 mg/dL — ABNORMAL LOW (ref 8.9–10.3)
Chloride: 96 mmol/L — ABNORMAL LOW (ref 98–111)
Creatinine, Ser: 0.34 mg/dL — ABNORMAL LOW (ref 0.44–1.00)
GFR calc Af Amer: >60 mL/min (ref >60)
GFR calc non Af Amer: >60 mL/min (ref >60)
Glucose, Bld: 183 mg/dL — ABNORMAL HIGH (ref 70–99)
Potassium: 3.7 mmol/L (ref 3.5–5.1)
Sodium: 138 mmol/L (ref 135–145)

## 2018-11-25 LAB — GLUCOSE, CAPILLARY
Glucose-Capillary: 145 mg/dL — ABNORMAL HIGH (ref 70–99)
Glucose-Capillary: 151 mg/dL — ABNORMAL HIGH (ref 70–99)
Glucose-Capillary: 160 mg/dL — ABNORMAL HIGH (ref 70–99)
Glucose-Capillary: 179 mg/dL — ABNORMAL HIGH (ref 70–99)
Glucose-Capillary: 181 mg/dL — ABNORMAL HIGH (ref 70–99)
Glucose-Capillary: 182 mg/dL — ABNORMAL HIGH (ref 70–99)
Glucose-Capillary: 203 mg/dL — ABNORMAL HIGH (ref 70–99)

## 2018-11-25 LAB — MAGNESIUM
Magnesium: 2.4 mg/dL (ref 1.7–2.4)
Magnesium: 2.5 mg/dL — ABNORMAL HIGH (ref 1.7–2.4)

## 2018-11-25 LAB — PHOSPHORUS
Phosphorus: 2.4 mg/dL — ABNORMAL LOW (ref 2.5–4.6)
Phosphorus: 2.2 mg/dL — ABNORMAL LOW (ref 2.5–4.6)

## 2018-11-25 MED ORDER — ONDANSETRON HCL 4 MG/2ML IJ SOLN
4.0000 mg | Freq: Four times a day (QID) | INTRAMUSCULAR | Status: DC | PRN
  Administered 2018-11-25 – 2018-11-29 (×2): 4 mg via INTRAVENOUS
  Filled 2018-11-25 (×2): qty 2

## 2018-11-25 MED ORDER — POTASSIUM PHOSPHATES 15 MMOLE/5ML IV SOLN
10.0000 mmol | Freq: Once | INTRAVENOUS | Status: AC
  Administered 2018-11-25: 10 mmol via INTRAVENOUS
  Filled 2018-11-25: qty 3.33

## 2018-11-25 NOTE — Progress Notes (Signed)
[facility] Progress Note Patient Name: Heather Barnes DOB: 06/25/1933 MRN: [id number]   Date of Service  11/25/2018  HPI/Events of Note  Oliguria - Bladder scan with > 700 mL residual.   eICU Interventions  Will order: 1. Foley Catheter.      Intervention Category Intermediate Interventions: Oliguria - evaluation and management  [name] 11/25/2018, 5:12 AM

## 2018-11-25 NOTE — Progress Notes (Signed)
NAME:  Heather Barnes, MRN:  [id number], DOB:  08-18-1933, LOS: 1 ADMISSION DATE:  11/24/2018, CONSULTATION DATE: 11/24/2018 REFERRING MD:  Dr. Tyrone Nine, ER, CHIEF COMPLAINT:  Short of breath   Brief History   85 yo female former smoker with COPD exacerbation, acute on chronic hypercapnic/hypoxic respiratory failure from pneumonia after failed outpt therapy with prednisone.  On ventilator, requiring medications for agitation  History of present illness   85 yo female was seen in pulmonary office on 11/13/18 with shortness of breath, nasal congestion, headache and low grade temperature up to 7F.  She was treated with prednisone.  She developed fatigue, nausea and weakness.  Her breathing symptoms got worse and she presented to ER.  ABG showed hypoxia and hypercapnia.  Tx with steroids, magnesium, and nebs.  Failed to improve and intubated.  CXR showed pneumonia and started on ABX.  COVID test negative.  Past Medical History  Severe COPD with emphysema, chronic hypoxic/hypercapnic respiratory failure on 3 liters supplemental oxygen, Lung cancer s/p XRT, Breast cancer  Significant Hospital Events   6/19 Admit   Consults:    Procedures:  ETT 6/19 >>  Significant Diagnostic Tests:  Chest x-ray IMPRESSION: 1. Endotracheal tube tip is 1.7 cm above the carina. Well-positioned enteric tube. 2. Stable trace left pleural effusion. 3. Stable patchy upper right parahilar and left retrocardiac consolidation, which could represent multilobar pneumonia, with underlying neoplasm not excluded. Close chest radiograph follow-up advised.  Micro Data:  COVID 6/19 >> negative Blood 6/19 >> Sputum 6/19 >> Pneumococcal Ag 6/19 >> Legionella Ag 6/19 >>  Antimicrobials:  Vancomycin 6/19 -6/20 Zosyn 6/19 >>  Interim history/subjective:  No overnight events Required medications to keep her comfortable Easily agitated  Objective   Blood pressure (!) 141/45, pulse (!) 105, temperature 98.8 F  (37.1 C), temperature source Axillary, resp. rate (!) 24, height 4\' 10"  (1.473 m), weight 54.2 kg, SpO2 97 %.    Vent Mode: PRVC FiO2 (%):  [40 %] 40 % Set Rate:  [18 bmp] 18 bmp Vt Set:  [320 mL] 320 mL PEEP:  [5 cmH20] 5 cmH20 Plateau Pressure:  [14 cmH20-17 cmH20] 17 cmH20   Intake/Output Summary (Last 24 hours) at 11/25/2018 1119 Last data filed at 11/25/2018 0800 Gross per 24 hour  Intake 679.23 ml  Output 750 ml  Net -70.77 ml   Filed Weights   11/24/18 0500 11/25/18 0500  Weight: 51.7 kg 54.2 kg   Examination: Elderly lady Agitated Endotracheal tube in place S1-S2 appreciated Bilateral crackles, wheezing Abdomen is soft, bowel sounds appreciated Extremities shows no clubbing, no edema Skin is warm and dry  Chest x-ray reviewed by myself  Assessment & Plan:   Acute on chronic hypoxic/hypercarbic respiratory failure from pneumonia COPD exacerbation Severe COPD -Continue vent support Follow ABG Day 2 of antibiotics Continue steroids Continue bronchodilators -ABG noted with improvement in CO2 levels  Hypotension -Continue IV fluids  Metabolic encephalopathy RA SS goal of 0 to -1 Fentanyl and Versed for sedation Titrate Precedex  Hypertension, hyperlipidemia Hold Lopressor Continue Crestor   She is not ready for extubation at present Ventilator adjustment for dyssynchrony  Best practice:  Diet: tube feeds DVT prophylaxis: Lovenox GI prophylaxis: Protonix Mobility: bed rest Code Status: full code Disposition: admit to ICU  Labs   CBC: Recent Labs  Lab 11/24/18 0452 11/25/18 0630  WBC 26.4* 28.5*  NEUTROABS 22.9*  --   HGB 12.1 10.0*  HCT 40.4 32.4*  MCV 90.2 89.0  PLT 370  952    Basic Metabolic Panel: Recent Labs  Lab 11/24/18 0452 11/24/18 0951 11/24/18 1716 11/25/18 0630  NA 134*  --   --  138  K 4.3  --   --  3.7  CL 88*  --   --  96*  CO2 33*  --   --  32  GLUCOSE 186*  --   --  183*  BUN 10  --   --  16  CREATININE  0.35*  --   --  0.34*  CALCIUM 9.6  --   --  8.1*  MG  --  2.5* 2.4 2.4  PHOS  --  3.2 2.3* 2.4*   GFR: Estimated Creatinine Clearance: 38.9 mL/min (A) (by C-G formula based on SCr of 0.34 mg/dL (L)). Recent Labs  Lab 11/24/18 0452 11/25/18 0630  WBC 26.4* 28.5*    Liver Function Tests: Recent Labs  Lab 11/24/18 0452  AST 28  ALT 124*  ALKPHOS 100  BILITOT 1.0  PROT 7.0  ALBUMIN 2.9*   No results for input(s): LIPASE, AMYLASE in the last 168 hours. No results for input(s): AMMONIA in the last 168 hours.  ABG    Component Value Date/Time   PHART 7.352 11/25/2018 0846   PCO2ART 66.0 (HH) 11/25/2018 0846   PO2ART 68.9 (L) 11/25/2018 0846   HCO3 35.7 (H) 11/25/2018 0846   TCO2 31 03/31/2016 0758   ACIDBASEDEF 5.2 (H) 11/14/2017 1040   O2SAT 92.7 11/25/2018 0846     Coagulation Profile: No results for input(s): INR, PROTIME in the last 168 hours.  Cardiac Enzymes: Recent Labs  Lab 11/24/18 0452  TROPONINI 0.05*    HbA1C: Hgb A1c MFr Bld  Date/Time Value Ref Range Status  06/11/2015 08:20 AM 5.8 (H) 4.8 - 5.6 % Final    Comment:    (NOTE)         Pre-diabetes: 5.7 - 6.4         Diabetes: >6.4         Glycemic control for adults with diabetes: <7.0     CBG: Recent Labs  Lab 11/24/18 1947 11/24/18 2341 11/25/18 0315 11/25/18 0443 11/25/18 0809  GLUCAP 232* 235* 203* 182* 181*    Review of Systems:   Unable to obtain  Past Medical History  She,  has a past medical history of Arthritis, Asthma, Breast cancer, right breast ([place]), Chronic lower back pain, Colon adenomas, Complication of anesthesia, COPD (chronic obstructive pulmonary disease) ([place]), Coronary artery calcification (09/01/2016), Dysrhythmia, GERD (gastroesophageal reflux disease), Goiter, History of duodenal ulcer, History of radiation therapy (05/24/11-06/02/11), colonic polyps, Hyperlipidemia, Lung cancer, middle lobe ([place]) (04/26/2011), On home oxygen therapy, Osteoporosis,  Osteoporosis, Palpitations (01/19/2016), Pneumonia (2017 X 2), Pollen allergy, PVC (premature ventricular contraction) (02/24/2016), Shortness of breath, and Wears glasses.   Surgical History    Past Surgical History:  Procedure Laterality Date  . APPENDECTOMY    . BIOPSY THYROID  2016  . BREAST BIOPSY Right 03/2017  . BREAST LUMPECTOMY WITH RADIOACTIVE SEED LOCALIZATION Right 04/11/2017  . BREAST LUMPECTOMY WITH RADIOACTIVE SEED LOCALIZATION Right 04/11/2017   Procedure: RIGHT BREAST LUMPECTOMY WITH RADIOACTIVE SEED LOCALIZATION;  Surgeon: Rolm Bookbinder, MD;  Location: [facility];  Service: General;  Laterality: Right;  . CATARACT EXTRACTION W/ INTRAOCULAR LENS  IMPLANT, BILATERAL Bilateral   . COLONOSCOPY W/ BIOPSIES    . DILATION AND CURETTAGE OF UTERUS  "many"  . TONSILLECTOMY    . TOTAL ABDOMINAL HYSTERECTOMY       Social  History   reports that she quit smoking about 20 years ago. Her smoking use included cigarettes. She has a 20.00 pack-year smoking history. She has never used smokeless tobacco. She reports that she does not drink alcohol or use drugs.   Family History   Her family history includes Brain cancer in her sister; Breast cancer in her daughter; Cancer in her sister, sister, and sister; Emphysema in her maternal uncle; Heart disease in her maternal grandfather and mother; Ovarian cancer in her sister; Rheum arthritis in her sister.   Allergies Allergies  Allergen Reactions  . Tiotropium Bromide Monohydrate Hives, Shortness Of Breath and Rash    Spiriva   . Codeine Other (See Comments)    Hyperactivity,crying  . Fosamax [Alendronate] Other (See Comments)    Muscle aches  . Asa [Aspirin] Other (See Comments)    Bleeding -ulcers  . Latex Rash      The patient is critically ill with multiple organ system failure and requires high complexity decision making for assessment and support, frequent evaluation and titration of therapies, advanced monitoring, review of  radiographic studies and interpretation of complex data.    Critical Care Time devoted to patient care services, exclusive of separately billable procedures, described in this note is 35 minutes.  Sherrilyn Rist, MD [place], PCCM Cell: [phone]

## 2018-11-26 DIAGNOSIS — J42 Unspecified chronic bronchitis: Secondary | ICD-10-CM

## 2018-11-26 LAB — GLUCOSE, CAPILLARY
Glucose-Capillary: 107 mg/dL — ABNORMAL HIGH (ref 70–99)
Glucose-Capillary: 111 mg/dL — ABNORMAL HIGH (ref 70–99)
Glucose-Capillary: 122 mg/dL — ABNORMAL HIGH (ref 70–99)
Glucose-Capillary: 123 mg/dL — ABNORMAL HIGH (ref 70–99)
Glucose-Capillary: 149 mg/dL — ABNORMAL HIGH (ref 70–99)
Glucose-Capillary: 155 mg/dL — ABNORMAL HIGH (ref 70–99)

## 2018-11-26 LAB — CREATININE, SERUM
Creatinine, Ser: 0.36 mg/dL — ABNORMAL LOW (ref 0.44–1.00)
GFR calc Af Amer: >60 mL/min (ref >60)
GFR calc non Af Amer: >60 mL/min (ref >60)

## 2018-11-26 MED ORDER — LORAZEPAM 2 MG/ML IJ SOLN
INTRAMUSCULAR | Status: AC
  Filled 2018-11-26: qty 1

## 2018-11-26 MED ORDER — LORAZEPAM 2 MG/ML IJ SOLN
0.5000 mg | Freq: Once | INTRAMUSCULAR | Status: AC
  Administered 2018-11-26: 0.5 mg via INTRAVENOUS

## 2018-11-26 NOTE — Procedures (Signed)
Extubation Procedure Note  Patient Details:   Name: Heather Barnes DOB: 09-06-1933 MRN: [id number]   Airway Documentation:  Airway 7.5 mm (Active)  Secured at (cm) 22 cm 11/26/18 0800  Measured From Lips 11/26/18 0800  Secured Location Left 11/26/18 0800  Secured By [employer] 11/26/18 0800  Tube Holder Repositioned Yes 11/26/18 0800  Cuff Pressure (cm H2O) 26 cm H2O 11/25/18 2034  Site Condition Dry 11/26/18 0800   Vent end date: 11/26/18 Vent end time: 0912   Evaluation  O2 sats: stable throughout Complications: No apparent complications Patient did tolerate procedure well. Bilateral Breath Sounds: Diminished   Yes   Patient extubated to [place]; will wean O2 as tolerated by patient. No stridor noted at this time. RT will continue to monitor patient.   Lamonte Sakai 11/26/2018, 9:14 AM

## 2018-11-26 NOTE — Progress Notes (Signed)
While bathing patient, patient desat 80% HR 145s. Was put on High-Flow 15 L and Non-rebreather. Patient recovering. Will continue to monitor patient

## 2018-11-26 NOTE — Progress Notes (Signed)
NAME:  Heather Barnes, MRN:  [id number], DOB:  11/06/1933, LOS: 2 ADMISSION DATE:  11/24/2018, CONSULTATION DATE: 11/24/2018 REFERRING MD:  Dr. Tyrone Nine, ER, CHIEF COMPLAINT:  Short of breath   Brief History   85 yo female former smoker with COPD exacerbation, acute on chronic hypercapnic/hypoxic respiratory failure from pneumonia after failed outpt therapy with prednisone.  Stable overnight tolerating weaning this morning  History of present illness   85 yo female was seen in pulmonary office on 11/13/18 with shortness of breath, nasal congestion, headache and low grade temperature up to 98F.  She was treated with prednisone.  She developed fatigue, nausea and weakness.  Her breathing symptoms got worse and she presented to ER.  ABG showed hypoxia and hypercapnia.  Tx with steroids, magnesium, and nebs.  Failed to improve and intubated.  CXR showed pneumonia and started on ABX.  COVID test negative.  Past Medical History  Severe COPD with emphysema, chronic hypoxic/hypercapnic respiratory failure on 3 liters supplemental oxygen, Lung cancer s/p XRT, Breast cancer  Significant Hospital Events   6/19 Admit   Consults:    Procedures:  ETT 6/19 >>  Significant Diagnostic Tests:  Chest x-ray IMPRESSION: 1. Endotracheal tube tip is 1.7 cm above the carina. Well-positioned enteric tube. 2. Stable trace left pleural effusion. 3. Stable patchy upper right parahilar and left retrocardiac consolidation, which could represent multilobar pneumonia, with underlying neoplasm not excluded. Close chest radiograph follow-up advised.  Micro Data:  COVID 6/19 >> negative Blood 6/19 >> Sputum 6/19 >> Pneumococcal Ag 6/19 >> Legionella Ag 6/19 >>  Antimicrobials:  Vancomycin 6/19 -6/20 Zosyn 6/19 >>  Interim history/subjective:  No overnight events Tolerating weaning, pressure support of 5, good tidal volumes and not agitated  Objective   Blood pressure (!) 117/34, pulse 90, temperature  98.8 F (37.1 C), temperature source Axillary, resp. rate 20, height 4\' 10"  (1.473 m), weight 57 kg, SpO2 96 %.    Vent Mode: PRVC FiO2 (%):  [30 %-40 %] 40 % Set Rate:  [18 bmp] 18 bmp Vt Set:  [320 mL] 320 mL PEEP:  [5 cmH20] 5 cmH20 Plateau Pressure:  [16 cmH20-26 cmH20] 18 cmH20   Intake/Output Summary (Last 24 hours) at 11/26/2018 0930 Last data filed at 11/26/2018 0800 Gross per 24 hour  Intake 1579.44 ml  Output 500 ml  Net 1079.44 ml   Filed Weights   11/24/18 0500 11/25/18 0500 11/26/18 0531  Weight: 51.7 kg 54.2 kg 57 kg   Examination:  Elderly lady Agitated Endotracheal tube in place S1-S2 appreciated Bilateral crackles, wheezing Abdomen is soft, bowel sounds appreciated Extremities shows no clubbing, no edema Skin is warm and dry  Chest x-ray reviewed by myself  Assessment & Plan:   Acute on chronic hypoxic/hypercarbic respiratory failure from pneumonia COPD exacerbation Severe COPD Tolerating weaning, will plan to extubate Follow arterial blood gases Day 3 of antibiotics Continue steroids Continue bronchodilators  Metabolic encephalopathy Agitation/anxiety Appears controlled at present  Hypotension Controlled  Hypertension, hyperlipidemia Hold Lopressor Continue Crestor  Hypotension -Continue IV fluids  Appears to be doing well I will plan for extubation today Continue medications for agitation She has been tolerating CPAP pressure support for about an hour-extubate today  Best practice:  Diet: We will advance diet once extubated DVT prophylaxis: Lovenox GI prophylaxis: Protonix Mobility: bed rest Code Status: full code Disposition: ICU Labs   CBC: Recent Labs  Lab 11/24/18 0452 11/25/18 0630  WBC 26.4* 28.5*  NEUTROABS 22.9*  --  HGB 12.1 10.0*  HCT 40.4 32.4*  MCV 90.2 89.0  PLT 370 973    Basic Metabolic Panel: Recent Labs  Lab 11/24/18 0452 11/24/18 0951 11/24/18 1716 11/25/18 0630 11/25/18 1703 11/26/18 0156   NA 134*  --   --  138  --   --   K 4.3  --   --  3.7  --   --   CL 88*  --   --  96*  --   --   CO2 33*  --   --  32  --   --   GLUCOSE 186*  --   --  183*  --   --   BUN 10  --   --  16  --   --   CREATININE 0.35*  --   --  0.34*  --  0.36*  CALCIUM 9.6  --   --  8.1*  --   --   MG  --  2.5* 2.4 2.4 2.5*  --   PHOS  --  3.2 2.3* 2.4* 2.2*  --    GFR: Estimated Creatinine Clearance: 39.8 mL/min (A) (by C-G formula based on SCr of 0.36 mg/dL (L)). Recent Labs  Lab 11/24/18 0452 11/25/18 0630  WBC 26.4* 28.5*    Liver Function Tests: Recent Labs  Lab 11/24/18 0452  AST 28  ALT 124*  ALKPHOS 100  BILITOT 1.0  PROT 7.0  ALBUMIN 2.9*   No results for input(s): LIPASE, AMYLASE in the last 168 hours. No results for input(s): AMMONIA in the last 168 hours.  ABG    Component Value Date/Time   PHART 7.352 11/25/2018 0846   PCO2ART 66.0 (HH) 11/25/2018 0846   PO2ART 68.9 (L) 11/25/2018 0846   HCO3 35.7 (H) 11/25/2018 0846   TCO2 31 03/31/2016 0758   ACIDBASEDEF 5.2 (H) 11/14/2017 1040   O2SAT 92.7 11/25/2018 0846     Coagulation Profile: No results for input(s): INR, PROTIME in the last 168 hours.  Cardiac Enzymes: Recent Labs  Lab 11/24/18 0452  TROPONINI 0.05*    HbA1C: Hgb A1c MFr Bld  Date/Time Value Ref Range Status  06/11/2015 08:20 AM 5.8 (H) 4.8 - 5.6 % Final    Comment:    (NOTE)         Pre-diabetes: 5.7 - 6.4         Diabetes: >6.4         Glycemic control for adults with diabetes: <7.0     CBG: Recent Labs  Lab 11/25/18 1558 11/25/18 2001 11/25/18 2331 11/26/18 0339 11/26/18 0728  GLUCAP 179* 160* 151* 149* 155*    Review of Systems:   Unable to obtain Responds to verbal commands  Past Medical History  She,  has a past medical history of Arthritis, Asthma, Breast cancer, right breast ([place]), Chronic lower back pain, Colon adenomas, Complication of anesthesia, COPD (chronic obstructive pulmonary disease) ([place]), Coronary artery  calcification (09/01/2016), Dysrhythmia, GERD (gastroesophageal reflux disease), Goiter, History of duodenal ulcer, History of radiation therapy (05/24/11-06/02/11), colonic polyps, Hyperlipidemia, Lung cancer, middle lobe ([place]) (04/26/2011), On home oxygen therapy, Osteoporosis, Osteoporosis, Palpitations (01/19/2016), Pneumonia (2017 X 2), Pollen allergy, PVC (premature ventricular contraction) (02/24/2016), Shortness of breath, and Wears glasses.   Surgical History    Past Surgical History:  Procedure Laterality Date  . APPENDECTOMY    . BIOPSY THYROID  2016  . BREAST BIOPSY Right 03/2017  . BREAST LUMPECTOMY WITH RADIOACTIVE SEED LOCALIZATION Right 04/11/2017  . BREAST LUMPECTOMY  WITH RADIOACTIVE SEED LOCALIZATION Right 04/11/2017   Procedure: RIGHT BREAST LUMPECTOMY WITH RADIOACTIVE SEED LOCALIZATION;  Surgeon: Rolm Bookbinder, MD;  Location: [facility];  Service: General;  Laterality: Right;  . CATARACT EXTRACTION W/ INTRAOCULAR LENS  IMPLANT, BILATERAL Bilateral   . COLONOSCOPY W/ BIOPSIES    . DILATION AND CURETTAGE OF UTERUS  "many"  . TONSILLECTOMY    . TOTAL ABDOMINAL HYSTERECTOMY       Social History   reports that she quit smoking about 20 years ago. Her smoking use included cigarettes. She has a 20.00 pack-year smoking history. She has never used smokeless tobacco. She reports that she does not drink alcohol or use drugs.   Family History   Her family history includes Brain cancer in her sister; Breast cancer in her daughter; Cancer in her sister, sister, and sister; Emphysema in her maternal uncle; Heart disease in her maternal grandfather and mother; Ovarian cancer in her sister; Rheum arthritis in her sister.   Allergies Allergies  Allergen Reactions  . Tiotropium Bromide Monohydrate Hives, Shortness Of Breath and Rash    Spiriva   . Codeine Other (See Comments)    Hyperactivity,crying  . Fosamax [Alendronate] Other (See Comments)    Muscle aches  . Asa [Aspirin] Other  (See Comments)    Bleeding -ulcers  . Latex Rash    The patient is critically ill with multiple organ system failure and requires high complexity decision making for assessment and support, frequent evaluation and titration of therapies, advanced monitoring, review of radiographic studies and interpretation of complex data.    Critical Care Time devoted to patient care services, exclusive of separately billable procedures, described in this note is 30 minutes.  Sherrilyn Rist, MD [place], PCCM Cell: [phone]

## 2018-11-26 NOTE — Progress Notes (Signed)
Assisted tele visit to patient with family member.  Amyr Sluder M, RN  

## 2018-11-27 LAB — CREATININE, SERUM
Creatinine, Ser: 0.38 mg/dL — ABNORMAL LOW (ref 0.44–1.00)
GFR calc Af Amer: >60 mL/min (ref >60)
GFR calc non Af Amer: >60 mL/min (ref >60)

## 2018-11-27 LAB — GLUCOSE, CAPILLARY
Glucose-Capillary: 99 mg/dL (ref 70–99)
Glucose-Capillary: 97 mg/dL (ref 70–99)
Glucose-Capillary: 121 mg/dL — ABNORMAL HIGH (ref 70–99)
Glucose-Capillary: 104 mg/dL — ABNORMAL HIGH (ref 70–99)
Glucose-Capillary: 119 mg/dL — ABNORMAL HIGH (ref 70–99)
Glucose-Capillary: 171 mg/dL — ABNORMAL HIGH (ref 70–99)

## 2018-11-27 LAB — HEMOGLOBIN A1C
Hgb A1c MFr Bld: 5.6 % (ref 4.8–5.6)
Mean Plasma Glucose: 114 mg/dL

## 2018-11-27 MED ORDER — ORAL CARE MOUTH RINSE
15.0000 mL | Freq: Two times a day (BID) | OROMUCOSAL | Status: DC
  Administered 2018-11-27: 15 mL via OROMUCOSAL

## 2018-11-27 MED ORDER — METOPROLOL TARTRATE 5 MG/5ML IV SOLN
2.5000 mg | Freq: Two times a day (BID) | INTRAVENOUS | Status: DC
  Administered 2018-11-27: 2.5 mg via INTRAVENOUS
  Filled 2018-11-27: qty 5

## 2018-11-27 MED ORDER — METOPROLOL TARTRATE 5 MG/5ML IV SOLN
INTRAVENOUS | Status: AC
  Filled 2018-11-27: qty 5

## 2018-11-27 MED ORDER — METOPROLOL TARTRATE 25 MG/10 ML ORAL SUSPENSION
25.0000 mg | Freq: Two times a day (BID) | ORAL | Status: DC
  Filled 2018-11-27: qty 10

## 2018-11-27 MED ORDER — CHLORHEXIDINE GLUCONATE 0.12 % MT SOLN
15.0000 mL | Freq: Two times a day (BID) | OROMUCOSAL | Status: DC
  Administered 2018-11-27 – 2018-11-28 (×4): 15 mL via OROMUCOSAL
  Filled 2018-11-27 (×2): qty 15

## 2018-11-27 MED ORDER — METOPROLOL TARTRATE 5 MG/5ML IV SOLN
5.0000 mg | Freq: Two times a day (BID) | INTRAVENOUS | Status: DC
  Administered 2018-11-27 – 2018-11-30 (×6): 5 mg via INTRAVENOUS
  Filled 2018-11-27 (×6): qty 5

## 2018-11-27 NOTE — Progress Notes (Signed)
NAME:  Heather Barnes, MRN:  [id number], DOB:  09-11-1933, LOS: 3 ADMISSION DATE:  11/24/2018, CONSULTATION DATE: 11/24/2018 REFERRING MD:  Dr. Tyrone Nine, ER, CHIEF COMPLAINT:  Short of breath   Brief History   85 yo female former smoker with COPD exacerbation, acute on chronic hypercapnic/hypoxic respiratory failure from pneumonia after failed outpt therapy with prednisone.  Struggling with her breathing this morning requiring increased FiO2  History of present illness   85 yo female was seen in pulmonary office on 11/13/18 with shortness of breath, nasal congestion, headache and low grade temperature up to 90F.  She was treated with prednisone.  She developed fatigue, nausea and weakness.  Her breathing symptoms got worse and she presented to ER.  ABG showed hypoxia and hypercapnia.  Tx with steroids, magnesium, and nebs.  Failed to improve and intubated.  CXR showed pneumonia and started on ABX.  COVID test negative.  Past Medical History  Severe COPD with emphysema, chronic hypoxic/hypercapnic respiratory failure on 3 liters supplemental oxygen, Lung cancer s/p XRT, Breast cancer  Significant Hospital Events   6/19 Admit   Consults:    Procedures:  ETT 6/19 , extubated 11/26/2018  Significant Diagnostic Tests:   Chest x-ray IMPRESSION: 1. Endotracheal tube tip is 1.7 cm above the carina. Well-positioned enteric tube. 2. Stable trace left pleural effusion. 3. Stable patchy upper right parahilar and left retrocardiac consolidation, which could represent multilobar pneumonia, with underlying neoplasm not excluded. Close chest radiograph follow-up advised.  Micro Data:  COVID 6/19 >> negative Blood 6/19 >> Sputum 6/19 >> Pneumococcal Ag 6/19 >> Legionella Ag 6/19 >>  Antimicrobials:  Vancomycin 6/19 -6/20 Zosyn 6/19 - day 5  Interim history/subjective:  No overnight events On oxygen supplementation Tachypneic  Objective   Blood pressure (!) 149/46, pulse (!) 112,  temperature 98 F (36.7 C), temperature source Oral, resp. rate (!) 21, height 4\' 10"  (1.473 m), weight 57 kg, SpO2 100 %.        Intake/Output Summary (Last 24 hours) at 11/27/2018 0957 Last data filed at 11/27/2018 0600 Gross per 24 hour  Intake 109.92 ml  Output 975 ml  Net -865.08 ml   Filed Weights   11/24/18 0500 11/25/18 0500 11/26/18 0531  Weight: 51.7 kg 54.2 kg 57 kg   Examination: Elderly lady, anxious Dry oral mucosa S1-S2 appreciated Bilateral crackles, wheezing Abdomen is soft, bowel sounds appreciated Extremities shows no clubbing, no edema Skin is warm and dry  Chest x-ray reviewed by myself  Assessment & Plan:   Acute on chronic hypoxic/hypercarbic respiratory failure from pneumonia COPD exacerbation Severe COPD Day 4 of antibiotics today Continue steroids Continue bronchodilators  Metabolic encephalopathy Agitation/anxiety Optimize treatment with anxiolytics  Hypertension, hyperlipidemia Renew Lopressor  Hypertension, hyperlipidemia Resume Lopressor  Continue Crestor   Still short of breath, stable CODE STATUS discussed with the patient-wants to be reintubated if needed Continue medications for agitation  BiPAP if needed Continue antibiotics for total of 7 days-will transition to Augmentin when more stable  Best practice:  Diet: We will advance diet once extubated DVT prophylaxis: Lovenox GI prophylaxis: Protonix Mobility: bed rest Code Status: full code Disposition: ICU Labs   CBC: Recent Labs  Lab 11/24/18 0452 11/25/18 0630  WBC 26.4* 28.5*  NEUTROABS 22.9*  --   HGB 12.1 10.0*  HCT 40.4 32.4*  MCV 90.2 89.0  PLT 370 858    Basic Metabolic Panel: Recent Labs  Lab 11/24/18 0452 11/24/18 0951 11/24/18 1716 11/25/18 0630 11/25/18 1703 11/26/18  4270 11/27/18 0158  NA 134*  --   --  138  --   --   --   K 4.3  --   --  3.7  --   --   --   CL 88*  --   --  96*  --   --   --   CO2 33*  --   --  32  --   --   --    GLUCOSE 186*  --   --  183*  --   --   --   BUN 10  --   --  16  --   --   --   CREATININE 0.35*  --   --  0.34*  --  0.36* 0.38*  CALCIUM 9.6  --   --  8.1*  --   --   --   MG  --  2.5* 2.4 2.4 2.5*  --   --   PHOS  --  3.2 2.3* 2.4* 2.2*  --   --    GFR: Estimated Creatinine Clearance: 39.8 mL/min (A) (by C-G formula based on SCr of 0.38 mg/dL (L)). Recent Labs  Lab 11/24/18 0452 11/25/18 0630  WBC 26.4* 28.5*    Liver Function Tests: Recent Labs  Lab 11/24/18 0452  AST 28  ALT 124*  ALKPHOS 100  BILITOT 1.0  PROT 7.0  ALBUMIN 2.9*   No results for input(s): LIPASE, AMYLASE in the last 168 hours. No results for input(s): AMMONIA in the last 168 hours.  ABG    Component Value Date/Time   PHART 7.352 11/25/2018 0846   PCO2ART 66.0 (HH) 11/25/2018 0846   PO2ART 68.9 (L) 11/25/2018 0846   HCO3 35.7 (H) 11/25/2018 0846   TCO2 31 03/31/2016 0758   ACIDBASEDEF 5.2 (H) 11/14/2017 1040   O2SAT 92.7 11/25/2018 0846     Coagulation Profile: No results for input(s): INR, PROTIME in the last 168 hours.  Cardiac Enzymes: Recent Labs  Lab 11/24/18 0452  TROPONINI 0.05*    HbA1C: Hgb A1c MFr Bld  Date/Time Value Ref Range Status  06/11/2015 08:20 AM 5.8 (H) 4.8 - 5.6 % Final    Comment:    (NOTE)         Pre-diabetes: 5.7 - 6.4         Diabetes: >6.4         Glycemic control for adults with diabetes: <7.0     CBG: Recent Labs  Lab 11/26/18 1609 11/26/18 1956 11/26/18 2336 11/27/18 0340 11/27/18 0818  GLUCAP 122* 111* 123* 99 97    Review of Systems:   Review of Systems  Constitutional: Positive for malaise/fatigue.  HENT: Positive for hearing loss.   Respiratory: Positive for cough and shortness of breath.      Past Medical History  She,  has a past medical history of Arthritis, Asthma, Breast cancer, right breast ([place]), Chronic lower back pain, Colon adenomas, Complication of anesthesia, COPD (chronic obstructive pulmonary disease) ([place]),  Coronary artery calcification (09/01/2016), Dysrhythmia, GERD (gastroesophageal reflux disease), Goiter, History of duodenal ulcer, History of radiation therapy (05/24/11-06/02/11), colonic polyps, Hyperlipidemia, Lung cancer, middle lobe ([place]) (04/26/2011), On home oxygen therapy, Osteoporosis, Osteoporosis, Palpitations (01/19/2016), Pneumonia (2017 X 2), Pollen allergy, PVC (premature ventricular contraction) (02/24/2016), Shortness of breath, and Wears glasses.   Surgical History    Past Surgical History:  Procedure Laterality Date  . APPENDECTOMY    . BIOPSY THYROID  2016  . BREAST BIOPSY Right  03/2017  . BREAST LUMPECTOMY WITH RADIOACTIVE SEED LOCALIZATION Right 04/11/2017  . BREAST LUMPECTOMY WITH RADIOACTIVE SEED LOCALIZATION Right 04/11/2017   Procedure: RIGHT BREAST LUMPECTOMY WITH RADIOACTIVE SEED LOCALIZATION;  Surgeon: Rolm Bookbinder, MD;  Location: [facility];  Service: General;  Laterality: Right;  . CATARACT EXTRACTION W/ INTRAOCULAR LENS  IMPLANT, BILATERAL Bilateral   . COLONOSCOPY W/ BIOPSIES    . DILATION AND CURETTAGE OF UTERUS  "many"  . TONSILLECTOMY    . TOTAL ABDOMINAL HYSTERECTOMY       Social History   reports that she quit smoking about 20 years ago. Her smoking use included cigarettes. She has a 20.00 pack-year smoking history. She has never used smokeless tobacco. She reports that she does not drink alcohol or use drugs.   Family History   Her family history includes Brain cancer in her sister; Breast cancer in her daughter; Cancer in her sister, sister, and sister; Emphysema in her maternal uncle; Heart disease in her maternal grandfather and mother; Ovarian cancer in her sister; Rheum arthritis in her sister.   Allergies Allergies  Allergen Reactions  . Tiotropium Bromide Monohydrate Hives, Shortness Of Breath and Rash    Spiriva   . Codeine Other (See Comments)    Hyperactivity,crying  . Fosamax [Alendronate] Other (See Comments)    Muscle aches  . Asa  [Aspirin] Other (See Comments)    Bleeding -ulcers  . Latex Rash    The patient is critically ill with multiple organ system failure and requires high complexity decision making for assessment and support, frequent evaluation and titration of therapies, advanced monitoring, review of radiographic studies and interpretation of complex data.    Critical Care Time devoted to patient care services, exclusive of separately billable procedures, described in this note is 32 minutes.  Sherrilyn Rist, MD [place], PCCM Cell: [phone]

## 2018-11-27 NOTE — Evaluation (Signed)
Clinical/Bedside Swallow Evaluation Patient Details  Name: Heather Barnes MRN: [id number] Date of Birth: [birth year]  Today's Date: 11/27/2018 Time: SLP Start Time (ACUTE ONLY): 0930 SLP Stop Time (ACUTE ONLY): 0950 SLP Time Calculation (min) (ACUTE ONLY): 20 min  Past Medical History:  Past Medical History:  Diagnosis Date  . Arthritis    "joints" (04/11/2017)  . Asthma   . Breast cancer, right breast ([place])    S/P RIGHT BREAST LUMPECTOMY WITH RADIOACTIVE SEED LOCALIZATION 04/11/2017  . Chronic lower back pain    "if I stand too long" (04/11/2017)  . Colon adenomas   . Complication of anesthesia    Must sit upright 45-60 degree angle per pulmonology  . COPD (chronic obstructive pulmonary disease) ([place])    "severe" (04/11/2017)  . Coronary artery calcification 09/01/2016  . Dysrhythmia    bigeminy  . GERD (gastroesophageal reflux disease)   . Goiter   . History of duodenal ulcer   . History of radiation therapy 05/24/11-06/02/11   R middle lobe lung/ 62 [place]  . Hx of colonic polyps   . Hyperlipidemia   . Lung cancer, middle lobe ([place]) 04/26/2011   S/P radiation 05/24/11-06/02/11  . On home oxygen therapy    "3L; all the time" (04/11/2017)  . Osteoporosis   . Osteoporosis   . Palpitations 01/19/2016   a. event monitor in 01/2016 showed PVC's with one episode of ventricular quadrigeminy.  . Pneumonia 2017 X 2  . Pollen allergy   . PVC (premature ventricular contraction) 02/24/2016  . Shortness of breath   . Wears glasses    Past Surgical History:  Past Surgical History:  Procedure Laterality Date  . APPENDECTOMY    . BIOPSY THYROID  2016  . BREAST BIOPSY Right 03/2017  . BREAST LUMPECTOMY WITH RADIOACTIVE SEED LOCALIZATION Right 04/11/2017  . BREAST LUMPECTOMY WITH RADIOACTIVE SEED LOCALIZATION Right 04/11/2017   Procedure: RIGHT BREAST LUMPECTOMY WITH RADIOACTIVE SEED LOCALIZATION;  Surgeon: Rolm Bookbinder, MD;  Location: [facility];  Service: General;  Laterality:  Right;  . CATARACT EXTRACTION W/ INTRAOCULAR LENS  IMPLANT, BILATERAL Bilateral   . COLONOSCOPY W/ BIOPSIES    . DILATION AND CURETTAGE OF UTERUS  "many"  . TONSILLECTOMY    . TOTAL ABDOMINAL HYSTERECTOMY     HPI:  85 yo female was seen in pulmonary office on 11/13/18 with shortness of breath, nasal congestion, headache and low grade temperature up to 64F.  She was treated with prednisone.  She developed fatigue, nausea and weakness.  Her breathing symptoms got worse and she presented to ER.  ABG showed hypoxia and hypercapnia.  Tx with steroids, magnesium, and nebs.  Failed to improve and intubated.  CXR showed pneumonia and started on ABX.  COVID test negative.   Assessment / Plan / Recommendation Clinical Impression  Pt significantly weak upon arrival of SLP. Tolerating ice chips per RN. Oral care was completed with suction, which pt tolerated without difficulty. Pt accepted trials of ice chips, thin liquid, and puree with adequate oral prep and control noted. Ice chips were tolerated without overt s/s aspiration, however, cough response noted after liquids and puree. additionally, pt was noted to exhibit increasing RR and HR, as well as increased WOB. This raises concern for fatigue and consequent increase in aspiration risk. At this time, recommend ONLY ice chips after oral care. SLP will continue to follow pt for readiness for po intake. RN and MD informed. SLP Visit Diagnosis: Dysphagia, unspecified (R13.10)    Aspiration Risk  Severe aspiration risk;Moderate aspiration risk    Diet Recommendation Ice chips PRN after oral care   Liquid Administration via: Spoon Medication Administration: Via alternative means Supervision: Patient able to self feed Compensations: Minimize environmental distractions;Slow rate;Small sips/bites Postural Changes: Seated upright at 90 degrees    Other  Recommendations Oral Care Recommendations: Oral care prior to ice chip/H20 Other Recommendations: Have  oral suction available   Follow up Recommendations Other (comment)(TBD)      Frequency and Duration min 1 x/week  1 week;2 weeks       Prognosis Prognosis for Safe Diet Advancement: Fair      Swallow Study   General Date of Onset: 11/24/18 HPI: 85 yo female was seen in pulmonary office on 11/13/18 with shortness of breath, nasal congestion, headache and low grade temperature up to 17F.  She was treated with prednisone.  She developed fatigue, nausea and weakness.  Her breathing symptoms got worse and she presented to ER.  ABG showed hypoxia and hypercapnia.  Tx with steroids, magnesium, and nebs.  Failed to improve and intubated.  CXR showed pneumonia and started on ABX.  COVID test negative. Type of Study: Bedside Swallow Evaluation Previous Swallow Assessment: none Diet Prior to this Study: NPO Temperature Spikes Noted: No Respiratory Status: Nasal cannula History of Recent Intubation: Yes Length of Intubations (days): 2 days Date extubated: 11/26/18 Behavior/Cognition: Alert;Cooperative;Pleasant mood Oral Cavity Assessment: Within Functional Limits Oral Care Completed by SLP: Yes Oral Cavity - Dentition: Adequate natural dentition Vision: Functional for self-feeding Self-Feeding Abilities: Able to feed self Patient Positioning: Upright in bed Baseline Vocal Quality: Breathy;Low vocal intensity Volitional Swallow: Able to elicit    Oral/Motor/Sensory Function Overall Oral Motor/Sensory Function: Within functional limits   Ice Chips Ice chips: Within functional limits   Thin Liquid Thin Liquid: Impaired Pharyngeal  Phase Impairments: Suspected delayed Swallow;Decreased hyoid-laryngeal movement;Cough - Immediate    Nectar Thick Nectar Thick Liquid: Not tested   Honey Thick     Puree Puree: Impaired Pharyngeal Phase Impairments: Suspected delayed Swallow;Decreased hyoid-laryngeal movement;Cough - Immediate   Solid     Solid: Not tested     Heather Barnes, [facility],  [place] Speech Language Pathologist [phone]  Shonna Chock 11/27/2018,10:37 AM

## 2018-11-27 NOTE — Progress Notes (Signed)
Chaplain asked nursing staff about patients who might like to have a chaplain visit.  Visited with patient and talking was difficult for patient and hard of hearing. Chaplain wrote note to show her would she like a prayer.  She said yes.  Chaplain prayed for the staff caring for her-wisdom and use of their skills to help patient and for peace of mind and heart for patient in her healing process.Conard Novak, Chaplain   11/27/18 1400  Clinical Encounter Type  Visited With Patient  Visit Type Initial;Spiritual support  Referral From Nurse  Consult/Referral To Chaplain  Spiritual Encounters  Spiritual Needs Prayer  Stress Factors  Patient Stress Factors Health changes  Family Stress Factors None identified

## 2018-11-27 NOTE — Progress Notes (Signed)
Assisted tele visit to patient with family member.  Burnice Vassel R, RN  

## 2018-11-27 NOTE — Progress Notes (Signed)
PHARMACY NOTE -  [facility] has been assisting with dosing of Zosyn for HCAP.  Dosage remains stable at 3.375 g IV q8 hr and need for further dosage adjustment appears unlikely at present given CrCl > 20  Pharmacy will sign off, following peripherally for culture results or dose adjustments. Please reconsult if a change in clinical status warrants re-evaluation of dosage.  Reuel Boom, PharmD, BCPS [phone] 11/27/2018, 2:38 PM

## 2018-11-28 ENCOUNTER — Inpatient Hospital Stay (HOSPITAL_COMMUNITY): Payer: Medicare Other

## 2018-11-28 DIAGNOSIS — J181 Lobar pneumonia, unspecified organism: Secondary | ICD-10-CM

## 2018-11-28 LAB — CBC WITH DIFFERENTIAL/PLATELET
Abs Immature Granulocytes: 1.17 10*3/uL — ABNORMAL HIGH (ref 0.00–0.07)
Basophils Absolute: 0.1 10*3/uL (ref 0.0–0.1)
Basophils Relative: 0 %
Eosinophils Absolute: 0 10*3/uL (ref 0.0–0.5)
Eosinophils Relative: 0 %
HCT: 35.7 % — ABNORMAL LOW (ref 36.0–46.0)
Hemoglobin: 10.7 g/dL — ABNORMAL LOW (ref 12.0–15.0)
Immature Granulocytes: 3 %
Lymphocytes Relative: 2 %
Lymphs Abs: 0.7 10*3/uL (ref 0.7–4.0)
MCH: 27 pg (ref 26.0–34.0)
MCHC: 30 g/dL (ref 30.0–36.0)
MCV: 90.2 fL (ref 80.0–100.0)
Monocytes Absolute: 2.1 10*3/uL — ABNORMAL HIGH (ref 0.1–1.0)
Monocytes Relative: 6 %
Neutro Abs: 34.5 10*3/uL — ABNORMAL HIGH (ref 1.7–7.7)
Neutrophils Relative %: 89 %
Platelets: 340 10*3/uL (ref 150–400)
RBC: 3.96 MIL/uL (ref 3.87–5.11)
RDW: 14.6 % (ref 11.5–15.5)
WBC: 38.7 10*3/uL — ABNORMAL HIGH (ref 4.0–10.5)
nRBC: 0.1 % (ref 0.0–0.2)

## 2018-11-28 LAB — GLUCOSE, CAPILLARY
Glucose-Capillary: 102 mg/dL — ABNORMAL HIGH (ref 70–99)
Glucose-Capillary: 103 mg/dL — ABNORMAL HIGH (ref 70–99)
Glucose-Capillary: 103 mg/dL — ABNORMAL HIGH (ref 70–99)
Glucose-Capillary: 112 mg/dL — ABNORMAL HIGH (ref 70–99)
Glucose-Capillary: 118 mg/dL — ABNORMAL HIGH (ref 70–99)
Glucose-Capillary: 130 mg/dL — ABNORMAL HIGH (ref 70–99)

## 2018-11-28 LAB — BASIC METABOLIC PANEL
Anion gap: 14 (ref 5–15)
BUN: 20 mg/dL (ref 8–23)
CO2: 28 mmol/L (ref 22–32)
Calcium: 8.2 mg/dL — ABNORMAL LOW (ref 8.9–10.3)
Chloride: 100 mmol/L (ref 98–111)
Creatinine, Ser: 0.36 mg/dL — ABNORMAL LOW (ref 0.44–1.00)
GFR calc Af Amer: >60 mL/min (ref >60)
GFR calc non Af Amer: >60 mL/min (ref >60)
Glucose, Bld: 127 mg/dL — ABNORMAL HIGH (ref 70–99)
Potassium: 4.9 mmol/L (ref 3.5–5.1)
Sodium: 142 mmol/L (ref 135–145)

## 2018-11-28 MED ORDER — DIPHENHYDRAMINE HCL 25 MG PO CAPS
25.0000 mg | ORAL_CAPSULE | Freq: Once | ORAL | Status: AC
  Administered 2018-11-28: 25 mg via ORAL
  Filled 2018-11-28: qty 1

## 2018-11-28 NOTE — Progress Notes (Signed)
NAME:  Heather Barnes, MRN:  [id number], DOB:  1933/11/16, LOS: 4 ADMISSION DATE:  11/24/2018, CONSULTATION DATE: 11/24/2018 REFERRING MD:  Dr. Tyrone Nine, ER, CHIEF COMPLAINT:  Short of breath   Brief History   85 yo female former smoker with COPD exacerbation, acute on chronic hypercapnic/hypoxic respiratory failure from pneumonia after failed outpt therapy with prednisone.  Struggling with her breathing this morning requiring increased FiO2  History of present illness   85 yo female was seen in pulmonary office on 11/13/18 with shortness of breath, nasal congestion, headache and low grade temperature up to 15F.  She was treated with prednisone.  She developed fatigue, nausea and weakness.  Her breathing symptoms got worse and she presented to ER.  ABG showed hypoxia and hypercapnia.  Tx with steroids, magnesium, and nebs.  Failed to improve and intubated.  CXR showed pneumonia and started on ABX.  COVID test negative.  Past Medical History  Severe COPD with emphysema, chronic hypoxic/hypercapnic respiratory failure on 3 liters supplemental oxygen, Lung cancer s/p XRT, Breast cancer  Significant Hospital Events   6/19 Admit   Consults:    Procedures:  ETT 6/19 , extubated 11/26/2018  Significant Diagnostic Tests:   Chest x-ray IMPRESSION: 1. Endotracheal tube tip is 1.7 cm above the carina. Well-positioned enteric tube. 2. Stable trace left pleural effusion. 3. Stable patchy upper right parahilar and left retrocardiac consolidation, which could represent multilobar pneumonia, with underlying neoplasm not excluded. Close chest radiograph follow-up advised.  Micro Data:  COVID 6/19 >> negative Blood 6/19 >> Sputum 6/19 >> Pneumococcal Ag 6/19 >> Legionella Ag 6/19 >>  Antimicrobials:  Vancomycin 6/19 -6/20 Zosyn 6/19 - day 7  Interim history/subjective:  No overnight events She states that she feels better Did poorly on swallowing evaluation on 6/22  Objective    Blood pressure (!) 175/57, pulse (!) 112, temperature 98.3 F (36.8 C), temperature source Axillary, resp. rate (!) 27, height 4\' 10"  (1.473 m), weight 54.6 kg, SpO2 95 %.        Intake/Output Summary (Last 24 hours) at 11/28/2018 0950 Last data filed at 11/28/2018 0840 Gross per 24 hour  Intake 270.24 ml  Output 900 ml  Net -629.76 ml   Filed Weights   11/25/18 0500 11/26/18 0531 11/28/18 0500  Weight: 54.2 kg 57 kg 54.6 kg   Examination: Elderly lady, comfortable Dry oral mucosa S1-S2 appreciated Bilateral crackles, wheezing Abdomen is soft, bowel sounds appreciated Extremities shows no clubbing, no edema Skin is warm and dry  Chest x-ray reviewed by myself-multifocal infiltrate   Assessment & Plan:   Acute on chronic hypoxic/hypercapnic respiratory failure from pneumonia COPD exacerbation Severe COPD Continue bronchodilators Has been on steroids-discontinue today  Leukocytosis -Likely related to steroids -Clinically better -Trend CBC  Metabolic encephalopathy Agitation/anxiety Optimize anxiolytics  Hypertension, hyperlipidemia Renew Lopressor Continue Crestor  She is generally better She is full code  BiPAP as needed Plan will be to discontinue Zosyn on 11/29/2018   Best practice:  Diet: We will advance diet once extubated DVT prophylaxis: Lovenox GI prophylaxis: Protonix Mobility: bed rest Code Status: full code Disposition: ICU Labs   CBC: Recent Labs  Lab 11/24/18 0452 11/25/18 0630 11/28/18 0226  WBC 26.4* 28.5* 38.7*  NEUTROABS 22.9*  --  34.5*  HGB 12.1 10.0* 10.7*  HCT 40.4 32.4* 35.7*  MCV 90.2 89.0 90.2  PLT 370 290 403    Basic Metabolic Panel: Recent Labs  Lab 11/24/18 0452 11/24/18 0951 11/24/18 1716 11/25/18 0630 11/25/18  1703 11/26/18 0156 11/27/18 0158 11/28/18 0226  NA 134*  --   --  138  --   --   --  142  K 4.3  --   --  3.7  --   --   --  4.9  CL 88*  --   --  96*  --   --   --  100  CO2 33*  --   --  32   --   --   --  28  GLUCOSE 186*  --   --  183*  --   --   --  127*  BUN 10  --   --  16  --   --   --  20  CREATININE 0.35*  --   --  0.34*  --  0.36* 0.38* 0.36*  CALCIUM 9.6  --   --  8.1*  --   --   --  8.2*  MG  --  2.5* 2.4 2.4 2.5*  --   --   --   PHOS  --  3.2 2.3* 2.4* 2.2*  --   --   --    GFR: Estimated Creatinine Clearance: 39 mL/min (A) (by C-G formula based on SCr of 0.36 mg/dL (L)). Recent Labs  Lab 11/24/18 0452 11/25/18 0630 11/28/18 0226  WBC 26.4* 28.5* 38.7*    Liver Function Tests: Recent Labs  Lab 11/24/18 0452  AST 28  ALT 124*  ALKPHOS 100  BILITOT 1.0  PROT 7.0  ALBUMIN 2.9*   No results for input(s): LIPASE, AMYLASE in the last 168 hours. No results for input(s): AMMONIA in the last 168 hours.  ABG    Component Value Date/Time   PHART 7.352 11/25/2018 0846   PCO2ART 66.0 (HH) 11/25/2018 0846   PO2ART 68.9 (L) 11/25/2018 0846   HCO3 35.7 (H) 11/25/2018 0846   TCO2 31 03/31/2016 0758   ACIDBASEDEF 5.2 (H) 11/14/2017 1040   O2SAT 92.7 11/25/2018 0846     Coagulation Profile: No results for input(s): INR, PROTIME in the last 168 hours.  Cardiac Enzymes: Recent Labs  Lab 11/24/18 0452  TROPONINI 0.05*    HbA1C: Hgb A1c MFr Bld  Date/Time Value Ref Range Status  11/25/2018 06:30 AM 5.6 4.8 - 5.6 % Final    Comment:    (NOTE)         Prediabetes: 5.7 - 6.4         Diabetes: >6.4         Glycemic control for adults with diabetes: <7.0   06/11/2015 08:20 AM 5.8 (H) 4.8 - 5.6 % Final    Comment:    (NOTE)         Pre-diabetes: 5.7 - 6.4         Diabetes: >6.4         Glycemic control for adults with diabetes: <7.0     CBG: Recent Labs  Lab 11/27/18 1550 11/27/18 2035 11/27/18 2321 11/28/18 0425 11/28/18 0827  GLUCAP 104* 119* 171* 118* 130*    Review of Systems:   Review of Systems  Constitutional: Positive for malaise/fatigue.  HENT: Positive for hearing loss.   Respiratory: Positive for cough and shortness of  breath.      Past Medical History  She,  has a past medical history of Arthritis, Asthma, Breast cancer, right breast ([place]), Chronic lower back pain, Colon adenomas, Complication of anesthesia, COPD (chronic obstructive pulmonary disease) ([place]), Coronary artery calcification (09/01/2016), Dysrhythmia, GERD (  gastroesophageal reflux disease), Goiter, History of duodenal ulcer, History of radiation therapy (05/24/11-06/02/11), colonic polyps, Hyperlipidemia, Lung cancer, middle lobe ([place]) (04/26/2011), On home oxygen therapy, Osteoporosis, Osteoporosis, Palpitations (01/19/2016), Pneumonia (2017 X 2), Pollen allergy, PVC (premature ventricular contraction) (02/24/2016), Shortness of breath, and Wears glasses.   Surgical History    Past Surgical History:  Procedure Laterality Date  . APPENDECTOMY    . BIOPSY THYROID  2016  . BREAST BIOPSY Right 03/2017  . BREAST LUMPECTOMY WITH RADIOACTIVE SEED LOCALIZATION Right 04/11/2017  . BREAST LUMPECTOMY WITH RADIOACTIVE SEED LOCALIZATION Right 04/11/2017   Procedure: RIGHT BREAST LUMPECTOMY WITH RADIOACTIVE SEED LOCALIZATION;  Surgeon: Rolm Bookbinder, MD;  Location: [facility];  Service: General;  Laterality: Right;  . CATARACT EXTRACTION W/ INTRAOCULAR LENS  IMPLANT, BILATERAL Bilateral   . COLONOSCOPY W/ BIOPSIES    . DILATION AND CURETTAGE OF UTERUS  "many"  . TONSILLECTOMY    . TOTAL ABDOMINAL HYSTERECTOMY       Social History   reports that she quit smoking about 20 years ago. Her smoking use included cigarettes. She has a 20.00 pack-year smoking history. She has never used smokeless tobacco. She reports that she does not drink alcohol or use drugs.   Family History   Her family history includes Brain cancer in her sister; Breast cancer in her daughter; Cancer in her sister, sister, and sister; Emphysema in her maternal uncle; Heart disease in her maternal grandfather and mother; Ovarian cancer in her sister; Rheum arthritis in her sister.    Allergies Allergies  Allergen Reactions  . Tiotropium Bromide Monohydrate Hives, Shortness Of Breath and Rash    Spiriva   . Codeine Other (See Comments)    Hyperactivity,crying  . Fosamax [Alendronate] Other (See Comments)    Muscle aches  . Asa [Aspirin] Other (See Comments)    Bleeding -ulcers  . Latex Rash     Sherrilyn Rist, MD [place], PCCM Cell: [phone]

## 2018-11-28 NOTE — Progress Notes (Addendum)
  Speech Language Pathology Treatment:    Patient Details Name: Heather Barnes MRN: [id number] DOB: [birth year] Today's Date: 11/28/2018 Time: 1120-1200 SLP Time Calculation (min) (ACUTE ONLY): 40 min  Assessment / Plan / Recommendation Clinical Impression  Pt seen for follow up after BSE completed 11/27/2018. Pt HR and RR were more stable today throughout po trials, however, pt continues to be profoundly weak and exhibits increased WOB and poor breath support during conversation. Delayed cough response noted intermittently after ice chips and puree trials, raising concern for airway compromise. Additionally, pt is at high risk for aspiration and inadequate nutrition/hydration given respiratory state and rapid fatigue with po trials. Recommend consideration of non-oral feeding method to maximize nutrition/hydration and improve strength and endurance for return to full po diet when safe to do so. It is felt that pt is too weak to tolerate MBS process at this time, however, she is showing slight improvement from yesterday. SLP will continue to follow to assess po/instrumental assessment readiness. RN and MD informed.    HPI HPI: 85 yo female was seen in pulmonary office on 11/13/18 with shortness of breath, nasal congestion, headache and low grade temperature up to 57F.  She was treated with prednisone.  She developed fatigue, nausea and weakness.  Her breathing symptoms got worse and she presented to ER.  ABG showed hypoxia and hypercapnia.  Tx with steroids, magnesium, and nebs.  Failed to improve and intubated.  CXR showed pneumonia and started on ABX.  COVID test negative.      SLP Plan  Continue with current plan of care       Recommendations  Diet recommendations: NPO;Other(comment)(ice chips after oral care) Medication Administration: Via alternative means Supervision: Patient able to self feed;Full supervision/cueing for compensatory strategies Compensations: Minimize environmental  distractions;Slow rate;Small sips/bites Postural Changes and/or Swallow Maneuvers: Seated upright 90 degrees;Upright 30-60 min after meal                Oral Care Recommendations: Oral care prior to ice chip/H20 Follow up Recommendations: Other (comment)(TBD) SLP Visit Diagnosis: Dysphagia, unspecified (R13.10) Plan: Continue with current plan of care       [place]. Quentin Ore, [facility] CCC-SLP Speech Language Pathologist  Shonna Chock 11/28/2018, 12:04 PM

## 2018-11-29 ENCOUNTER — Other Ambulatory Visit: Payer: Self-pay | Admitting: Pulmonary Disease

## 2018-11-29 DIAGNOSIS — J9611 Chronic respiratory failure with hypoxia: Secondary | ICD-10-CM

## 2018-11-29 LAB — CBC WITH DIFFERENTIAL/PLATELET
Abs Immature Granulocytes: 0.74 10*3/uL — ABNORMAL HIGH (ref 0.00–0.07)
Basophils Absolute: 0.1 10*3/uL (ref 0.0–0.1)
Basophils Relative: 0 %
Eosinophils Absolute: 0.1 10*3/uL (ref 0.0–0.5)
Eosinophils Relative: 0 %
HCT: 34 % — ABNORMAL LOW (ref 36.0–46.0)
Hemoglobin: 10.2 g/dL — ABNORMAL LOW (ref 12.0–15.0)
Immature Granulocytes: 3 %
Lymphocytes Relative: 3 %
Lymphs Abs: 0.7 10*3/uL (ref 0.7–4.0)
MCH: 27.5 pg (ref 26.0–34.0)
MCHC: 30 g/dL (ref 30.0–36.0)
MCV: 91.6 fL (ref 80.0–100.0)
Monocytes Absolute: 1.6 10*3/uL — ABNORMAL HIGH (ref 0.1–1.0)
Monocytes Relative: 6 %
Neutro Abs: 23.2 10*3/uL — ABNORMAL HIGH (ref 1.7–7.7)
Neutrophils Relative %: 88 %
Platelets: 317 10*3/uL (ref 150–400)
RBC: 3.71 MIL/uL — ABNORMAL LOW (ref 3.87–5.11)
RDW: 14.6 % (ref 11.5–15.5)
WBC: 26.4 10*3/uL — ABNORMAL HIGH (ref 4.0–10.5)
nRBC: 0.1 % (ref 0.0–0.2)

## 2018-11-29 LAB — CULTURE, BLOOD (ROUTINE X 2)
Culture: NO GROWTH
Culture: NO GROWTH
Special Requests: ADEQUATE

## 2018-11-29 LAB — GLUCOSE, CAPILLARY
Glucose-Capillary: 90 mg/dL (ref 70–99)
Glucose-Capillary: 95 mg/dL (ref 70–99)
Glucose-Capillary: 119 mg/dL — ABNORMAL HIGH (ref 70–99)
Glucose-Capillary: 118 mg/dL — ABNORMAL HIGH (ref 70–99)
Glucose-Capillary: 117 mg/dL — ABNORMAL HIGH (ref 70–99)

## 2018-11-29 MED ORDER — ORAL CARE MOUTH RINSE
15.0000 mL | Freq: Two times a day (BID) | OROMUCOSAL | Status: DC
  Administered 2018-11-29 – 2018-11-30 (×3): 15 mL via OROMUCOSAL

## 2018-11-29 MED ORDER — PANTOPRAZOLE SODIUM 40 MG PO PACK
40.0000 mg | PACK | ORAL | Status: DC
  Administered 2018-11-30: 40 mg via ORAL
  Filled 2018-11-29 (×2): qty 20

## 2018-11-29 MED ORDER — SODIUM CHLORIDE 0.9 % IV SOLN
INTRAVENOUS | Status: DC | PRN
  Administered 2018-11-29: 250 mL via INTRAVENOUS

## 2018-11-29 NOTE — Progress Notes (Signed)
Spoke with patient's daughter and son [name] and [name]  Patient will be made DNR  Diet as tolerated Aspiration precautions  They will talk with further we will respect to goals of care

## 2018-11-29 NOTE — Progress Notes (Signed)
  Speech Language Pathology Treatment: Dysphagia  Patient Details Name: Heather Barnes MRN: [id number] DOB: [birth year] Today's Date: 11/29/2018 Time: 2836-6294 SLP Time Calculation (min) (ACUTE ONLY): 21 min  Assessment / Plan / Recommendation  Information session completed with pt obtaining premorbid information; pt verbalizes h/o progressive decline in the last 3 years causing her to "run out of air" when she speaks; In addition, pt admits to poor intake prior to admission and progressive coughing with po x six months.  Cough occurs more with liquids than foods per pt.  She admits her cough is weak and nonproductive-which is her baseline.  Pt now states she does NOT want to be put back on ventilator nor have CPR done stating she needs to "rest" whether it's here or "up there" - pointing up (indicating heaven in this SlPs opinion).  With pt permission, left pt and informed CCS MD of pt's report.  MD states pt changed her mind when he spoke to her about this issue therefore he advised he would need to speak to family.  MD advised given pt does not want invasive procedure, approval for diet with least aspiration risk obtained.   HPI HPI: 85 yo female was seen in pulmonary office on 11/13/18 with shortness of breath, nasal congestion, headache and low grade temperature up to 14F.  She was treated with prednisone.  She developed fatigue, nausea and weakness.  Her breathing symptoms got worse and she presented to ER.  ABG showed hypoxia and hypercapnia.  Tx with steroids, magnesium, and nebs.  Failed to improve and intubated.  CXR showed pneumonia and started on ABX.  COVID test negative.      SLP Plan  Continue with current plan of care       Recommendations  Diet recommendations: NPO(ice) Postural Changes and/or Swallow Maneuvers: Seated upright 90 degrees;Upright 30-60 min after meal                Oral Care Recommendations: Oral care prior to ice chip/H20 Follow up Recommendations:  Other (comment)(TBD) SLP Visit Diagnosis: Dysphagia, unspecified (R13.10) Plan: Continue with current plan of care       GO                Macario Golds 11/29/2018, 9:55 AM  Luanna Salk, MS [facility] SLP Acute Rehab Services Pager [phone] Office [phone]

## 2018-11-29 NOTE — Progress Notes (Signed)
Speech Language Pathology Treatment: Dysphagia  Patient Details Name: Heather Barnes MRN: [id number] DOB: 1934-03-13 Today's Date: 11/29/2018 Time: 0920-0943 SLP Time Calculation (min) (ACUTE ONLY): 23 min  Assessment / Plan / Recommendation Clinical Impression  Second session completed to clinically determine optimal diet to mitigate aspiration risk.    RR initially was low 20s increasing to high 20s with po intake.  Inhalation noted post swallow with each bolus.  Pt desires po intake regardless of risk.  She presents with likely delayed oral transiting which she states is due to "savoring" the food/drink.  Pt provided with ice-cream bolus x2, applesauce bolus x1, nectar thick applejuice x3 boluses, single ice chips and thin water x3 boluses. Delayed subtle cough after multiple swallows noted with nectar and purees - which pt reports is normal.   However with thin water via tsp, she demonstrated immediate increased strength of cough post-swallow - highly suspicious of aspiration.  Pt reports its "flatter" and gets away from her faster.  Chin tuck posture trialed with thin via straw without s/s of aspiration but pt expending a large amount of energy with trying oral suction on straw which will increase her fatigue.  TSPS of thin with chin tuck posture tolerated without indication of airway compromise.    Pt agreeable to full liquid diet today saying "you are singing my tune" with use of chin tuck postures.  Given pt became significantly fatigued after just 8 boluses, do not anticipate she will functionally be able to obtain adequate nutrition.  Pt closed her eyes and was falling asleep before SLP left room.    Pt again reports premorbid coughing with liquids more than foods x 6 months.  She admits at times she would require expectoration of food or pills but not frequently.  Highly recommend consideration for palliative consult to establish goals for this pt with progressive functional decline.     Pt is excessively weak and only able to produce a few syllables per breath group.  Her voice when able to obtain is close to baseline and pt states she would "run out of air" talking to people prior to admit telling them to "talk" and she "will listen".    Pt agreeable to plan for po today with strict precautions.     HPI HPI: 85 yo female was seen in pulmonary office on 11/13/18 with shortness of breath, nasal congestion, headache and low grade temperature up to 67F.  She was treated with prednisone.  She developed fatigue, nausea and weakness.  Her breathing symptoms got worse and she presented to ER.  ABG showed hypoxia and hypercapnia.  Tx with steroids, magnesium, and nebs.  Failed to improve and intubated.  CXR showed pneumonia and started on ABX.  COVID test negative.      SLP Plan  Continue with current plan of care       Recommendations  Diet recommendations: Thin liquid;Nectar-thick liquid;Other(comment)(full liquid) Liquids provided via: Teaspoon;No straw Medication Administration: Crushed with puree Supervision: Patient able to self feed;Full supervision/cueing for compensatory strategies Compensations: Minimize environmental distractions;Slow rate;Small sips/bites;Chin tuck Postural Changes and/or Swallow Maneuvers: Seated upright 90 degrees;Upright 30-60 min after meal(allow time for multiple swallows - signficantly delayed)                Oral Care Recommendations: Oral care prior to ice chip/H20 Follow up Recommendations: Other (comment)(TBD) SLP Visit Diagnosis: Dysphagia, unspecified (R13.10) Plan: Continue with current plan of care       GO  Macario Golds 11/29/2018, 10:02 AM   Heather Salk, MS [facility] SLP Acute Rehab Services Pager [phone] Office [phone]

## 2018-11-29 NOTE — Progress Notes (Addendum)
NAME:  Heather Barnes, MRN:  [id number], DOB:  11/24/1933, LOS: 5 ADMISSION DATE:  11/24/2018, CONSULTATION DATE: 11/24/2018 REFERRING MD:  Dr. Tyrone Nine, ER, CHIEF COMPLAINT:  Short of breath   Brief History   85 yo female former smoker with COPD exacerbation, acute on chronic hypercapnic/hypoxic respiratory failure from pneumonia after failed outpt therapy with prednisone.  Struggling with her breathing this morning requiring increased FiO2  History of present illness   85 yo female was seen in pulmonary office on 11/13/18 with shortness of breath, nasal congestion, headache and low grade temperature up to 73F.  She was treated with prednisone.  She developed fatigue, nausea and weakness.  Her breathing symptoms got worse and she presented to ER.  ABG showed hypoxia and hypercapnia.  Tx with steroids, magnesium, and nebs.  Failed to improve and intubated.  CXR showed pneumonia and started on ABX.  COVID test negative.  Past Medical History  Severe COPD with emphysema, chronic hypoxic/hypercapnic respiratory failure on 3 liters supplemental oxygen, Lung cancer s/p XRT, Breast cancer  Significant Hospital Events   6/19 Admit   Consults:    Procedures:  ETT 6/19 , extubated 11/26/2018  Significant Diagnostic Tests:   Chest x-ray IMPRESSION: 1. Endotracheal tube tip is 1.7 cm above the carina. Well-positioned enteric tube. 2. Stable trace left pleural effusion. 3. Stable patchy upper right parahilar and left retrocardiac consolidation, which could represent multilobar pneumonia, with underlying neoplasm not excluded. Close chest radiograph follow-up advised.  Micro Data:  COVID 6/19 >> negative Blood 6/19 >> Sputum 6/19 >> Pneumococcal Ag 6/19 >> Legionella Ag 6/19 >>  Antimicrobials:  Vancomycin 6/19 -6/20 Zosyn 6/19 - day 8--will discontinue after today's dose  Interim history/subjective:  No overnight events Feeling better Still very weak, poor swallowing  Objective    Blood pressure (!) 112/37, pulse 81, temperature (!) 97.5 F (36.4 C), temperature source Axillary, resp. rate 18, height 4\' 10"  (1.473 m), weight 55.4 kg, SpO2 92 %.        Intake/Output Summary (Last 24 hours) at 11/29/2018 0840 Last data filed at 11/29/2018 0500 Gross per 24 hour  Intake 603.89 ml  Output 1150 ml  Net -546.11 ml   Filed Weights   11/26/18 0531 11/28/18 0500 11/29/18 0346  Weight: 57 kg 54.6 kg 55.4 kg   Examination: Elderly lady, comfortable Dry oral mucosa S1-S2 appreciated Bilateral crackles, wheezing Abdomen is soft, bowel sounds appreciated Extremities showing no clubbing, no edema Skin is warm and dry   Assessment & Plan:   Acute on chronic hypoxic/hypercapnic respiratory failure from pneumonia COPD exacerbation Severe COPD Continue bronchodilators Continue pulmonary toileting Oxygen supplementation to keep saturation greater than 89%  Leukocytosis Likely related to steroids Clinically better Continue to trend CBC  Metabolic encephalopathy Agitation/anxiety Optimize anxiolytics  Hypertension, hyperlipidemia Continue Lopressor Continue Crestor   She is generally better She is full code  BiPAP as needed Plan will be to discontinue Zosyn on 11/29/2018-we will discontinue today Recheck CBC today Has been afebrile Labs for AM  Best practice:  Diet: We will advance diet once extubated DVT prophylaxis: Lovenox GI prophylaxis: Protonix Mobility: bed rest Code Status: full code Disposition: ICU Labs   CBC: Recent Labs  Lab 11/24/18 0452 11/25/18 0630 11/28/18 0226  WBC 26.4* 28.5* 38.7*  NEUTROABS 22.9*  --  34.5*  HGB 12.1 10.0* 10.7*  HCT 40.4 32.4* 35.7*  MCV 90.2 89.0 90.2  PLT 370 290 956    Basic Metabolic Panel: Recent Labs  Lab 11/24/18 0452 11/24/18 0951 11/24/18 1716 11/25/18 0630 11/25/18 1703 11/26/18 0156 11/27/18 0158 11/28/18 0226  NA 134*  --   --  138  --   --   --  142  K 4.3  --   --  3.7   --   --   --  4.9  CL 88*  --   --  96*  --   --   --  100  CO2 33*  --   --  32  --   --   --  28  GLUCOSE 186*  --   --  183*  --   --   --  127*  BUN 10  --   --  16  --   --   --  20  CREATININE 0.35*  --   --  0.34*  --  0.36* 0.38* 0.36*  CALCIUM 9.6  --   --  8.1*  --   --   --  8.2*  MG  --  2.5* 2.4 2.4 2.5*  --   --   --   PHOS  --  3.2 2.3* 2.4* 2.2*  --   --   --    GFR: Estimated Creatinine Clearance: 39.3 mL/min (A) (by C-G formula based on SCr of 0.36 mg/dL (L)). Recent Labs  Lab 11/24/18 0452 11/25/18 0630 11/28/18 0226  WBC 26.4* 28.5* 38.7*    Liver Function Tests: Recent Labs  Lab 11/24/18 0452  AST 28  ALT 124*  ALKPHOS 100  BILITOT 1.0  PROT 7.0  ALBUMIN 2.9*   No results for input(s): LIPASE, AMYLASE in the last 168 hours. No results for input(s): AMMONIA in the last 168 hours.  ABG    Component Value Date/Time   PHART 7.352 11/25/2018 0846   PCO2ART 66.0 (HH) 11/25/2018 0846   PO2ART 68.9 (L) 11/25/2018 0846   HCO3 35.7 (H) 11/25/2018 0846   TCO2 31 03/31/2016 0758   ACIDBASEDEF 5.2 (H) 11/14/2017 1040   O2SAT 92.7 11/25/2018 0846     Coagulation Profile: No results for input(s): INR, PROTIME in the last 168 hours.  Cardiac Enzymes: Recent Labs  Lab 11/24/18 0452  TROPONINI 0.05*    HbA1C: Hgb A1c MFr Bld  Date/Time Value Ref Range Status  11/25/2018 06:30 AM 5.6 4.8 - 5.6 % Final    Comment:    (NOTE)         Prediabetes: 5.7 - 6.4         Diabetes: >6.4         Glycemic control for adults with diabetes: <7.0   06/11/2015 08:20 AM 5.8 (H) 4.8 - 5.6 % Final    Comment:    (NOTE)         Pre-diabetes: 5.7 - 6.4         Diabetes: >6.4         Glycemic control for adults with diabetes: <7.0     CBG: Recent Labs  Lab 11/28/18 1532 11/28/18 1946 11/28/18 2337 11/29/18 0318 11/29/18 0757  GLUCAP 112* 103* 102* 90 95    Review of Systems:   Review of Systems  Constitutional: Positive for malaise/fatigue.  HENT:  Positive for hearing loss.   Respiratory: Positive for cough and shortness of breath.      Past Medical History  She,  has a past medical history of Arthritis, Asthma, Breast cancer, right breast ([place]), Chronic lower back pain, Colon adenomas, Complication of anesthesia, COPD (chronic  obstructive pulmonary disease) ([place]), Coronary artery calcification (09/01/2016), Dysrhythmia, GERD (gastroesophageal reflux disease), Goiter, History of duodenal ulcer, History of radiation therapy (05/24/11-06/02/11), colonic polyps, Hyperlipidemia, Lung cancer, middle lobe ([place]) (04/26/2011), On home oxygen therapy, Osteoporosis, Osteoporosis, Palpitations (01/19/2016), Pneumonia (2017 X 2), Pollen allergy, PVC (premature ventricular contraction) (02/24/2016), Shortness of breath, and Wears glasses.   Surgical History    Past Surgical History:  Procedure Laterality Date  . APPENDECTOMY    . BIOPSY THYROID  2016  . BREAST BIOPSY Right 03/2017  . BREAST LUMPECTOMY WITH RADIOACTIVE SEED LOCALIZATION Right 04/11/2017  . BREAST LUMPECTOMY WITH RADIOACTIVE SEED LOCALIZATION Right 04/11/2017   Procedure: RIGHT BREAST LUMPECTOMY WITH RADIOACTIVE SEED LOCALIZATION;  Surgeon: Rolm Bookbinder, MD;  Location: [facility];  Service: General;  Laterality: Right;  . CATARACT EXTRACTION W/ INTRAOCULAR LENS  IMPLANT, BILATERAL Bilateral   . COLONOSCOPY W/ BIOPSIES    . DILATION AND CURETTAGE OF UTERUS  "many"  . TONSILLECTOMY    . TOTAL ABDOMINAL HYSTERECTOMY       Social History   reports that she quit smoking about 20 years ago. Her smoking use included cigarettes. She has a 20.00 pack-year smoking history. She has never used smokeless tobacco. She reports that she does not drink alcohol or use drugs.   Family History   Her family history includes Brain cancer in her sister; Breast cancer in her daughter; Cancer in her sister, sister, and sister; Emphysema in her maternal uncle; Heart disease in her maternal grandfather and  mother; Ovarian cancer in her sister; Rheum arthritis in her sister.   Allergies Allergies  Allergen Reactions  . Tiotropium Bromide Monohydrate Hives, Shortness Of Breath and Rash    Spiriva   . Codeine Other (See Comments)    Hyperactivity,crying  . Fosamax [Alendronate] Other (See Comments)    Muscle aches  . Asa [Aspirin] Other (See Comments)    Bleeding -ulcers  . Latex Rash     Sherrilyn Rist, MD [place], PCCM Cell: [phone]  Addendum: Discussed with speech She remains very weak She does not want any invasive means of feeding We will gradually advance to whatever is safest for patient to tolerate

## 2018-11-29 NOTE — Progress Notes (Signed)
Family would like to set up video conference at [place]. Will pass on to night shift to set up with [employer].  Azzie Glatter: [phone]

## 2018-11-29 NOTE — Progress Notes (Signed)
Assisted tele visit to patient with son.  Vear Clock, RN

## 2018-11-30 ENCOUNTER — Inpatient Hospital Stay (HOSPITAL_COMMUNITY): Payer: Medicare Other

## 2018-11-30 ENCOUNTER — Telehealth: Payer: Self-pay | Admitting: Primary Care

## 2018-11-30 DIAGNOSIS — J189 Pneumonia, unspecified organism: Secondary | ICD-10-CM

## 2018-11-30 DIAGNOSIS — J441 Chronic obstructive pulmonary disease with (acute) exacerbation: Secondary | ICD-10-CM

## 2018-11-30 DIAGNOSIS — J9622 Acute and chronic respiratory failure with hypercapnia: Secondary | ICD-10-CM

## 2018-11-30 DIAGNOSIS — J9621 Acute and chronic respiratory failure with hypoxia: Secondary | ICD-10-CM

## 2018-11-30 LAB — CBC WITH DIFFERENTIAL/PLATELET
Abs Immature Granulocytes: 0.53 10*3/uL — ABNORMAL HIGH (ref 0.00–0.07)
Basophils Absolute: 0.1 10*3/uL (ref 0.0–0.1)
Basophils Relative: 0 %
Eosinophils Absolute: 0.2 10*3/uL (ref 0.0–0.5)
Eosinophils Relative: 1 %
HCT: 32.4 % — ABNORMAL LOW (ref 36.0–46.0)
Hemoglobin: 9.7 g/dL — ABNORMAL LOW (ref 12.0–15.0)
Immature Granulocytes: 2 %
Lymphocytes Relative: 3 %
Lymphs Abs: 0.8 10*3/uL (ref 0.7–4.0)
MCH: 27.6 pg (ref 26.0–34.0)
MCHC: 29.9 g/dL — ABNORMAL LOW (ref 30.0–36.0)
MCV: 92.3 fL (ref 80.0–100.0)
Monocytes Absolute: 1.5 10*3/uL — ABNORMAL HIGH (ref 0.1–1.0)
Monocytes Relative: 6 %
Neutro Abs: 20.6 10*3/uL — ABNORMAL HIGH (ref 1.7–7.7)
Neutrophils Relative %: 88 %
Platelets: 229 10*3/uL (ref 150–400)
RBC: 3.51 MIL/uL — ABNORMAL LOW (ref 3.87–5.11)
RDW: 14.6 % (ref 11.5–15.5)
WBC: 23.6 10*3/uL — ABNORMAL HIGH (ref 4.0–10.5)
nRBC: 0.1 % (ref 0.0–0.2)

## 2018-11-30 LAB — BASIC METABOLIC PANEL
Anion gap: 6 (ref 5–15)
BUN: 13 mg/dL (ref 8–23)
CO2: 38 mmol/L — ABNORMAL HIGH (ref 22–32)
Calcium: 8.4 mg/dL — ABNORMAL LOW (ref 8.9–10.3)
Chloride: 98 mmol/L (ref 98–111)
Creatinine, Ser: 0.36 mg/dL — ABNORMAL LOW (ref 0.44–1.00)
GFR calc Af Amer: >60 mL/min (ref >60)
GFR calc non Af Amer: >60 mL/min (ref >60)
Glucose, Bld: 118 mg/dL — ABNORMAL HIGH (ref 70–99)
Potassium: 4 mmol/L (ref 3.5–5.1)
Sodium: 142 mmol/L (ref 135–145)

## 2018-11-30 LAB — GLUCOSE, CAPILLARY
Glucose-Capillary: 120 mg/dL — ABNORMAL HIGH (ref 70–99)
Glucose-Capillary: 126 mg/dL — ABNORMAL HIGH (ref 70–99)
Glucose-Capillary: 147 mg/dL — ABNORMAL HIGH (ref 70–99)
Glucose-Capillary: 93 mg/dL (ref 70–99)

## 2018-11-30 MED ORDER — ENSURE ENLIVE PO LIQD
237.0000 mL | Freq: Two times a day (BID) | ORAL | Status: DC
  Administered 2018-11-30 – 2018-12-05 (×6): 237 mL via ORAL

## 2018-11-30 MED ORDER — ADULT MULTIVITAMIN W/MINERALS CH
1.0000 | ORAL_TABLET | Freq: Every day | ORAL | Status: DC
  Administered 2018-11-30 – 2018-12-05 (×5): 1 via ORAL
  Filled 2018-11-30 (×6): qty 1

## 2018-11-30 MED ORDER — PHENOL 1.4 % MT LIQD
1.0000 | OROMUCOSAL | Status: DC | PRN
  Filled 2018-11-30: qty 177

## 2018-11-30 NOTE — Progress Notes (Signed)
NAME:  Heather Barnes, MRN:  [id number], DOB:  1933-06-19, LOS: 6 ADMISSION DATE:  11/24/2018, CONSULTATION DATE: 11/24/2018 REFERRING MD:  Dr. Tyrone Nine, ER, CHIEF COMPLAINT:  Short of breath   Brief History   25 yowf  MM quit smoking 2000  with COPD GOLD III spirometry  with very low dlco as of 02/2011 pft admit awith  exacerbation, acute on chronic hypercapnic/hypoxic respiratory failure from pneumonia after failed outpt therapy with prednisone.     History of present illness   85 yo female was seen in pulmonary office on 11/13/18 with shortness of breath, nasal congestion, headache and low grade temperature up to 4F.  She was treated with prednisone.  She developed fatigue, nausea and weakness.  Her breathing symptoms got worse and she presented to ER.  ABG showed hypoxia and hypercapnia.  Tx with steroids, magnesium, and nebs.  Failed to improve and intubated.  CXR showed pneumonia and started on ABX.  COVID test negative.  Past Medical History  Severe COPD with emphysema, chronic hypoxic/hypercapnic respiratory failure on 3 liters supplemental oxygen, Lung cancer s/p XRT, Breast cancer  Significant Hospital Events   6/19 Admit   Consults:    Procedures:  ETT 6/19 , extubated 11/26/2018  Significant Diagnostic Tests:      Micro Data:  COVID 6/19 >> neg  MRSA  6/19 neg  Blood 6/19 >> neg x 2       Antimicrobials:  Vancomycin 6/19 -6/20 Zosyn 6/19 -  D/c'd 6/24   Interim history/subjective:  No overnight events Feeling better Still very weak, poor swallowing  Objective   Blood pressure (!) 150/35, pulse 95, temperature 98 F (36.7 C), temperature source Oral, resp. rate 18, height 4\' 10"  (1.473 m), weight 55.4 kg, SpO2 96 %.    On 4lpm NP     Intake/Output Summary (Last 24 hours) at 11/30/2018 1002 Last data filed at 11/30/2018 3875 Gross per 24 hour  Intake 1325.42 ml  Output 750 ml  Net 575.42 ml   Filed Weights   11/26/18 0531 11/28/18 0500 11/29/18 0346   Weight: 57 kg 54.6 kg 55.4 kg   Examination:  elderly wf looks more weak than sob/ rattling cough on FVC Pt alert, approp @ 45 degrees HOB No jvd Oropharynx clear,  mucosa nl Neck supple Lungs with distant bs bilaterally RRR no s3 or or sign murmur Abd  Soft with nl excursion  Extr warm with no edema or clubbing noted Neuro  Sensorium intace,  no apparent motor deficits      I personally reviewed images and agree with radiology impression as follows:  CXR:   11/30/18 Stable bilateral lung opacities are noted, right greater than left, concerning for pneumonia or possibly atelectasis.   Assessment & Plan:   Acute on chronic hypoxic/hypercapnic respiratory failure from pneumonia/ no better on serial cxr's sp zosyn x 5 days  COPD exacerbation Severe COPD Rec:  Flutter valve/ mobilize if possible with pt / ot ? Home with hospice? Titrate 02 to low 64P        Metabolic encephalopathy Agitation/anxiety Alert and approp today    Hypertension, hyperlipidemia Changed to bisoprolol 6/25 / continue crestor        Best practice:  Diet: We will advance diet once extubated DVT prophylaxis: Lovenox GI prophylaxis: Protonix Mobility: bed rest Code Status: full code Disposition: ICU Labs   CBC: Recent Labs  Lab 11/24/18 0452 11/25/18 0630 11/28/18 0226 11/29/18 1059 11/30/18 0235  WBC 26.4* 28.5*  38.7* 26.4* 23.6*  NEUTROABS 22.9*  --  34.5* 23.2* 20.6*  HGB 12.1 10.0* 10.7* 10.2* 9.7*  HCT 40.4 32.4* 35.7* 34.0* 32.4*  MCV 90.2 89.0 90.2 91.6 92.3  PLT 370 290 340 317 850    Basic Metabolic Panel: Recent Labs  Lab 11/24/18 0452 11/24/18 0951 11/24/18 1716 11/25/18 0630 11/25/18 1703 11/26/18 0156 11/27/18 0158 11/28/18 0226 11/30/18 0235  NA 134*  --   --  138  --   --   --  142 142  K 4.3  --   --  3.7  --   --   --  4.9 4.0  CL 88*  --   --  96*  --   --   --  100 98  CO2 33*  --   --  32  --   --   --  28 38*  GLUCOSE 186*  --   --  183*  --    --   --  127* 118*  BUN 10  --   --  16  --   --   --  20 13  CREATININE 0.35*  --   --  0.34*  --  0.36* 0.38* 0.36* 0.36*  CALCIUM 9.6  --   --  8.1*  --   --   --  8.2* 8.4*  MG  --  2.5* 2.4 2.4 2.5*  --   --   --   --   PHOS  --  3.2 2.3* 2.4* 2.2*  --   --   --   --    GFR: Estimated Creatinine Clearance: 39.3 mL/min (A) (by C-G formula based on SCr of 0.36 mg/dL (L)). Recent Labs  Lab 11/25/18 0630 11/28/18 0226 11/29/18 1059 11/30/18 0235  WBC 28.5* 38.7* 26.4* 23.6*    Liver Function Tests: Recent Labs  Lab 11/24/18 0452  AST 28  ALT 124*  ALKPHOS 100  BILITOT 1.0  PROT 7.0  ALBUMIN 2.9*   No results for input(s): LIPASE, AMYLASE in the last 168 hours. No results for input(s): AMMONIA in the last 168 hours.  ABG    Component Value Date/Time   PHART 7.352 11/25/2018 0846   PCO2ART 66.0 (HH) 11/25/2018 0846   PO2ART 68.9 (L) 11/25/2018 0846   HCO3 35.7 (H) 11/25/2018 0846   TCO2 31 03/31/2016 0758   ACIDBASEDEF 5.2 (H) 11/14/2017 1040   O2SAT 92.7 11/25/2018 0846     Coagulation Profile: No results for input(s): INR, PROTIME in the last 168 hours.  Cardiac Enzymes: Recent Labs  Lab 11/24/18 0452  TROPONINI 0.05*    HbA1C: Hgb A1c MFr Bld  Date/Time Value Ref Range Status  11/25/2018 06:30 AM 5.6 4.8 - 5.6 % Final    Comment:    (NOTE)         Prediabetes: 5.7 - 6.4         Diabetes: >6.4         Glycemic control for adults with diabetes: <7.0   06/11/2015 08:20 AM 5.8 (H) 4.8 - 5.6 % Final    Comment:    (NOTE)         Pre-diabetes: 5.7 - 6.4         Diabetes: >6.4         Glycemic control for adults with diabetes: <7.0     CBG: Recent Labs  Lab 11/29/18 1618 11/29/18 2045 11/30/18 0011 11/30/18 0304 11/30/18 0808  GLUCAP 118* 117* 126* 120* 93  Discussed with nursing, no need for ICU/ no longer req BIPAP,  NCB status appropriate here.   Christinia Gully, MD Pulmonary and [place]  [phone] After 5:30 PM or weekends, use [facility] [phone]

## 2018-11-30 NOTE — Telephone Encounter (Signed)
Call returned to patient daughter, made aware her name is not on her DPR but Dr. Ander Slade is not here in the [facility] today and she would need to call the hospital for a update regarding her mother since she is in the hospital. Voiced understanding. Nothing further is needed at this time.

## 2018-11-30 NOTE — Progress Notes (Signed)
Pt had good talk with family through [facility] connection

## 2018-11-30 NOTE — Progress Notes (Signed)
  Speech Language Pathology Treatment: Dysphagia  Patient Details Name: Saylor Murry MRN: [id number] DOB: 1934/05/19 Today's Date: 11/30/2018 Time: 8032-1224 SLP Time Calculation (min) (ACUTE ONLY): 23 min  Assessment / Plan / Recommendation Clinical Impression  Maximum cues to tuck her chin with swallows of strawberry icecream (her desired food). Pt only took 2 swallows and then stated "I need to rest" after resting she desired an ice chip and took one bolus only. No indication of aspiration with po using chin tuck and allowing pt "time".    Pt with mildly increased RR with any exertion and clinically sounds more congested at her upper airway without ability to clear with cough.  Anticipate pt's intake will be poor due to her work of breathing and lethargy.    Highly recommend a palliative consult to establish goals of care as she will not obtain adequate intake for nutrition.  SLP called pt's daughter per pt request and spoke to pt's daughter and requested she bring in tissues and special pillow that is her grandson's to use for her oxygen.   HPI HPI: 85 yo female was seen in pulmonary office on 11/13/18 with shortness of breath, nasal congestion, headache and low grade temperature up to 37F.  She was treated with prednisone.  She developed fatigue, nausea and weakness.  Her breathing symptoms got worse and she presented to ER.  ABG showed hypoxia and hypercapnia.  Tx with steroids, magnesium, and nebs.  Failed to improve and intubated.  CXR showed pneumonia and started on ABX.  COVID test negative.      SLP Plan  Continue with current plan of care       Recommendations  Diet recommendations: Thin liquid;Nectar-thick liquid(full liquids) Liquids provided via: Cup;No straw Medication Administration: Crushed with puree(crush with icecream) Supervision: Full supervision/cueing for compensatory strategies;Staff to assist with self feeding Compensations: Minimize environmental  distractions;Slow rate;Small sips/bites;Chin tuck Postural Changes and/or Swallow Maneuvers: Seated upright 90 degrees;Upright 30-60 min after meal                Oral Care Recommendations: Oral care QID Follow up Recommendations: Other (comment)(tbd) SLP Visit Diagnosis: Dysphagia, unspecified (R13.10) Plan: Continue with current plan of care       [place], Grabiel Schmutz Ann 11/30/2018, 9:18 AM  Luanna Salk, MS [facility] SLP Acute Rehab Services Pager [phone] Office [phone]

## 2018-11-30 NOTE — Progress Notes (Signed)
Nutrition Follow-up  DOCUMENTATION CODES:   Not applicable  INTERVENTION:  - will d/c TF regimen. - will order Ensure Enlive BID, each supplement provides 350 kcal and 20 grams of protein. - will order Magic Cup BID with meals, each supplement provides 290 kcal and 9 grams of protein. - will order daily multivitamin with minerals. - continue to encourage PO intakes.  - will monitor for POC/GOC.   NUTRITION DIAGNOSIS:   Inadequate oral intake related to acute illness, lethargy/confusion as evidenced by meal completion < 50%. -revised, ongoing  GOAL:   Patient will meet greater than or equal to 90% of their needs -unmet  MONITOR:   PO intake, Supplement acceptance, Labs, Weight trends  ASSESSMENT:   85 yo female former smoker with COPD exacerbation, acute on chronic hypercapnic/hypoxic respiratory failure from pneumonia after failed outpt therapy with prednisone.  Weight +3.7 kg/8 lb since admission (6/19). Patient was extubated and OGT removed on 9/21 at ~9:15 AM. Estimated nutrition needs updated at this time and based on admission weight of 51.7 kg. Diet advanced from NPO to [place] yesterday (6/24) at 10:17 AM. No intakes documented since that time.  Read through SLP note from yesterday and also able to talk with SLP in person concerning patient. Patient requested strawberry ice cream during the time of RD visit. Patient very winded and tired during conversation. She reports very poor sleep quality last night and the past few nights. She denies abdominal pain or nausea, denies any oral or throat pain or sensitivity s/p extubation.   Per notes: patient was made DNR yesterday (6/24), severe COPD, leukocytosis thought to be d/t steroids--improved, metabolic encephalopathy.   Medications reviewed; sliding scale novolog.  Labs reviewed; CBGs: 126, 120, and 93 mg/dl today, creatinine: 0.36 mg/dl, Ca: 8.4 mg/dl.  IVF; NS @ 50 ml/hr.      NUTRITION - FOCUSED PHYSICAL  EXAM:  completed; no muscle or fat wasting noted.   Diet Order:   Diet Order            Diet full liquid Room service appropriate? Yes; Fluid consistency: Thin  Diet effective now              EDUCATION NEEDS:   No education needs have been identified at this time  Skin:  Skin Assessment: Reviewed RN Assessment  Last BM:  PTA  Height:   Ht Readings from Last 1 Encounters:  11/25/18 4\' 10"  (1.473 m)    Weight:   Wt Readings from Last 1 Encounters:  11/29/18 55.4 kg    Ideal Body Weight:  44 kg  BMI:  Body mass index is 25.53 kg/m.  Estimated Nutritional Needs:   Kcal:  1550-1810 kcal  Protein:  65-80 grams  Fluid:  >/= 1.6 L/day     Jarome Matin, MS, RD, LDN, [facility] Inpatient Clinical Dietitian Pager # [phone] After hours/weekend pager # [phone]

## 2018-11-30 NOTE — Progress Notes (Signed)
Patient requesting to rest at this time. CPT held. Patient encouraged to use flutter. RT will attempt CPT via vest at next scheduled time.

## 2018-11-30 NOTE — Progress Notes (Signed)
Patient transferred to new room 1517 along with her belongings. Her daughter was notified of the new room assignment. Receiving nurse aware of patients arrival.

## 2018-12-01 ENCOUNTER — Telehealth: Payer: Self-pay | Admitting: Internal Medicine

## 2018-12-01 ENCOUNTER — Inpatient Hospital Stay (HOSPITAL_COMMUNITY): Payer: Medicare Other

## 2018-12-01 DIAGNOSIS — Z7189 Other specified counseling: Secondary | ICD-10-CM

## 2018-12-01 DIAGNOSIS — Z515 Encounter for palliative care: Secondary | ICD-10-CM

## 2018-12-01 DIAGNOSIS — R0602 Shortness of breath: Secondary | ICD-10-CM

## 2018-12-01 LAB — GLUCOSE, CAPILLARY
Glucose-Capillary: 106 mg/dL — ABNORMAL HIGH (ref 70–99)
Glucose-Capillary: 113 mg/dL — ABNORMAL HIGH (ref 70–99)
Glucose-Capillary: 113 mg/dL — ABNORMAL HIGH (ref 70–99)
Glucose-Capillary: 118 mg/dL — ABNORMAL HIGH (ref 70–99)
Glucose-Capillary: 155 mg/dL — ABNORMAL HIGH (ref 70–99)
Glucose-Capillary: 94 mg/dL (ref 70–99)

## 2018-12-01 MED ORDER — BISOPROLOL FUMARATE 5 MG PO TABS
5.0000 mg | ORAL_TABLET | Freq: Every day | ORAL | Status: DC
  Administered 2018-12-01 – 2018-12-05 (×5): 5 mg via ORAL
  Filled 2018-12-01 (×5): qty 1

## 2018-12-01 MED ORDER — IPRATROPIUM-ALBUTEROL 0.5-2.5 (3) MG/3ML IN SOLN
3.0000 mL | Freq: Four times a day (QID) | RESPIRATORY_TRACT | Status: DC
  Administered 2018-12-01 – 2018-12-05 (×17): 3 mL via RESPIRATORY_TRACT
  Filled 2018-12-01 (×17): qty 3

## 2018-12-01 MED ORDER — PANTOPRAZOLE SODIUM 40 MG PO TBEC
40.0000 mg | DELAYED_RELEASE_TABLET | Freq: Every day | ORAL | Status: DC
  Administered 2018-12-01 – 2018-12-05 (×5): 40 mg via ORAL
  Filled 2018-12-01 (×5): qty 1

## 2018-12-01 MED ORDER — LIP MEDEX EX OINT
TOPICAL_OINTMENT | CUTANEOUS | Status: AC
  Administered 2018-12-01: 12:00:00
  Filled 2018-12-01: qty 7

## 2018-12-01 MED ORDER — PREDNISONE 20 MG PO TABS
20.0000 mg | ORAL_TABLET | Freq: Every day | ORAL | Status: DC
  Administered 2018-12-01 – 2018-12-05 (×5): 20 mg via ORAL
  Filled 2018-12-01 (×5): qty 1

## 2018-12-01 NOTE — Consult Note (Signed)
Consultation Note Date: 12/01/2018   Patient Name: Heather Barnes  DOB: 08/30/1933  MRN: [id number]  Age / Sex: 85 y.o., female  PCP: [name], MD Referring Physician: Laurin Coder, MD  Reason for Consultation: Establishing goals of care  HPI/Patient Profile: 85 y.o. female  admitted on 11/24/2018    Clinical Assessment and Goals of Care:  85 yo lady who lives at home with her daughter, has severe COPD with emphysema, lung cancer s/p XRT, history of breast cancer. She is oxygen dependent, is on 3L [place] pretty much on a 247 basis. Patient sees Dr Chase Caller in the outpatient setting for her pulmonary follow up.   Patient has been admitted with COPD exacerbation, recent recurrent pneumonia, worsening acute on chronic hypercapnic/hypoxic resp failure.   She even required intubation in this hospitalization, she is now extubated. SLP has been following, patient on liquid diet.   A palliative consult has been requested for ongoing goals of care discussions.   Patient is sitting up in bed. She is trying to eat. I introduced myself and palliative care as follows; Palliative medicine is specialized medical care for people living with serious illness. It focuses on providing relief from the symptoms and stress of a serious illness. The goal is to improve quality of life for both the patient and the family.  Goals of care: Broad aims of medical therapy in relation to the patient's values and preferences. Our aim is to provide medical care aimed at enabling patients to achieve the goals that matter most to them, given the circumstances of their particular medical situation and their constraints.   Patient gets breathless after a few sentences, she appears dyspneic. She acknowledges that she has had a decline in her health recently. She is more short of breath with the slightest exertion, at home. I gave her  information about how hospice services can help at home. She is receptive to this information.   Call placed and separately discussed with both son and daughter. They recognize that the patient is having an ongoing decline. We discussed about end stage COPD, goals wishes and values important to the patient and the family as a unit attempted to be explored. See below.   NEXT OF KIN  son [name] and daughter [name].   SUMMARY OF RECOMMENDATIONS    Agree with DNR DNI HCPOA agents are both son [name] and daughter [name], patient lives with daughter.  I have initiated hospice discussions with both patient and family, home with hospice services explained to the best of my ability. Patient's children would like to discuss further amongst themselves, they are concerned that the patient is only on liquid diet currently. I have discussed with them about pulmonary cachexia/systemic effects of pulmonary disease. PMT to continue to follow and assist with further goals of care clarifications and assist with appropriate disposition.  Thank you for the consult.   Code Status/Advance Care Planning:  DNR    Symptom Management:    continue current mode of care, monitor for symptom  management needs.   Palliative Prophylaxis:   Delirium Protocol  Psycho-social/Spiritual:   Desire for further Chaplaincy support:yes  Additional Recommendations: Education on Hospice  Prognosis:   < 6 months  Discharge Planning: Home with Hospice has been recommended, patient and family to discuss further and decide.        Primary Diagnoses: Present on Admission: . Respiratory failure ([place]) . COPD exacerbation ([place]) . COPD, very severe ([place]) . CAP (community acquired pneumonia)   I have reviewed the medical record, interviewed the patient and family, and examined the patient. The following aspects are pertinent.  Past Medical History:  Diagnosis Date  . Arthritis    "joints" (04/11/2017)  . Asthma    . Breast cancer, right breast ([place])    S/P RIGHT BREAST LUMPECTOMY WITH RADIOACTIVE SEED LOCALIZATION 04/11/2017  . Chronic lower back pain    "if I stand too long" (04/11/2017)  . Colon adenomas   . Complication of anesthesia    Must sit upright 45-60 degree angle per pulmonology  . COPD (chronic obstructive pulmonary disease) ([place])    "severe" (04/11/2017)  . Coronary artery calcification 09/01/2016  . Dysrhythmia    bigeminy  . GERD (gastroesophageal reflux disease)   . Goiter   . History of duodenal ulcer   . History of radiation therapy 05/24/11-06/02/11   R middle lobe lung/ 60 gray  . Hx of colonic polyps   . Hyperlipidemia   . Lung cancer, middle lobe ([place]) 04/26/2011   S/P radiation 05/24/11-06/02/11  . On home oxygen therapy    "3L; all the time" (04/11/2017)  . Osteoporosis   . Osteoporosis   . Palpitations 01/19/2016   a. event monitor in 01/2016 showed PVC's with one episode of ventricular quadrigeminy.  . Pneumonia 2017 X 2  . Pollen allergy   . PVC (premature ventricular contraction) 02/24/2016  . Shortness of breath   . Wears glasses    Social History   Socioeconomic History  . Marital status: Widowed    Spouse name: Not on file  . Number of children: Not on file  . Years of education: Not on file  . Highest education level: Not on file  Occupational History  . Occupation: retired  Scientific laboratory technician  . Financial resource strain: Not on file  . Food insecurity    Worry: Not on file    Inability: Not on file  . Transportation needs    Medical: Not on file    Non-medical: Not on file  Tobacco Use  . Smoking status: Former Smoker    Packs/day: 0.50    Years: 40.00    Pack years: 20.00    Types: Cigarettes    Quit date: 06/07/1998    Years since quitting: 20.4  . Smokeless tobacco: Never Used  Substance and Sexual Activity  . Alcohol use: No  . Drug use: No  . Sexual activity: Not on file  Lifestyle  . Physical activity    Days per week: Not on file     Minutes per session: Not on file  . Stress: Not on file  Relationships  . Social Herbalist on phone: Not on file    Gets together: Not on file    Attends religious service: Not on file    Active member of club or organization: Not on file    Attends meetings of clubs or organizations: Not on file    Relationship status: Not on file  Other Topics Concern  .  Not on file  Social History Narrative   On 03/17/11: expressed concerns about harmful effects of XRT based on dtr;s experience with the same with breast cancer   Family History  Problem Relation Age of Onset  . Emphysema Maternal Uncle   . Heart disease Mother   . Heart disease Maternal Grandfather   . Rheum arthritis Sister   . Cancer Sister   . Cancer Sister        throat cancer  . Brain cancer Sister   . Cancer Sister   . Ovarian cancer Sister   . Breast cancer Daughter    Scheduled Meds: . bisoprolol  5 mg Oral Daily  . feeding supplement (ENSURE ENLIVE)  237 mL Oral BID BM  . fluticasone  2 spray Each Nare Daily  . ipratropium-albuterol  3 mL Nebulization QID  . montelukast  10 mg Per Tube QHS  . multivitamin with minerals  1 tablet Oral Daily  . pantoprazole  40 mg Oral QAC breakfast  . predniSONE  20 mg Oral Q breakfast  . rosuvastatin  10 mg Per Tube QODAY   Continuous Infusions: . sodium chloride 50 mL/hr at 11/29/18 0300  . sodium chloride Stopped (11/30/18 1240)   PRN Meds:.sodium chloride, albuterol, bisacodyl, docusate, ondansetron (ZOFRAN) IV, phenol Medications Prior to Admission:  Prior to Admission medications   Medication Sig Start Date End Date Taking? Authorizing Provider  acetaminophen (TYLENOL) 325 MG tablet Take 650 mg by mouth every 4 (four) hours as needed for mild pain or headache.    Yes [provider]  albuterol (PROVENTIL HFA;VENTOLIN HFA) 108 (90 Base) MCG/ACT inhaler Inhale 2 puffs into the lungs every 6 (six) hours as needed for wheezing or shortness of breath.    Yes [provider]  Calcium Carbonate-Vitamin D 600-200 MG-UNIT TABS Take 1 tablet by mouth daily.   Yes [provider]  cetirizine (ZYRTEC) 10 MG tablet Take 10 mg by mouth daily.     Yes [provider]  Cholecalciferol (VITAMIN D) 50 MCG (2000 UT) CAPS Take 2,000 Units by mouth daily.   Yes [provider]  fluticasone (FLONASE) 50 MCG/ACT nasal spray Place 2 sprays into the nose daily as needed for allergies or rhinitis.    Yes [provider]  metoprolol tartrate (LOPRESSOR) 25 MG tablet TAKE 1/2 (ONE-HALF) TABLET BY MOUTH TWICE DAILY Patient taking differently: 2 (two) times daily.  06/27/18  Yes Skeet Latch, MD  Multiple Vitamins-Minerals (PRESERVISION AREDS 2) CAPS Take 1 tablet by mouth 2 (two) times a day.    Yes [provider]  Omega-3 Fatty Acids (FISH OIL) 1000 MG CAPS Take 1,000 mg by mouth at bedtime.    Yes [provider]  rosuvastatin (CRESTOR) 10 MG tablet Take 10 mg by mouth See admin instructions. Take 1 tablet on Mondays and Thursdays only.   Yes [provider]  SINGULAIR 10 MG tablet Take 10 mg by mouth at bedtime.  12/29/10  Yes [provider]  TRELEGY ELLIPTA 100-62.5-25 MCG/INH AEPB Inhale 1 puff by mouth once daily 09/25/18  Yes Ramaswamy, Belva Crome, MD  azelastine (ASTELIN) 0.1 % nasal spray Place 2 sprays into both nostrils 2 (two) times daily. Use in each nostril as directed Patient not taking: Reported on 11/24/2018 11/13/18   Martyn Ehrich, NP  OXYGEN Inhale into the lungs. 3L at rest and 4pulse with exertion    [provider]   Allergies  Allergen Reactions  . Tiotropium Bromide Monohydrate  Hives, Shortness Of Breath and Rash    Spiriva   . Codeine Other (See Comments)    Hyperactivity,crying  . Fosamax [Alendronate] Other (See Comments)    Muscle aches  . Asa [Aspirin] Other (See Comments)    Bleeding -ulcers  . Latex Rash   Review of Systems +shortness of  breath Physical Exam Diminished at bases Patient is awake alert Regular Patient is thin Abdomen not distended not tender No edema Increased work of breathing after patient speaks for more than a minute with me  Vital Signs: BP (!) 140/48 (BP Location: Left Arm)   Pulse 93   Temp 98.7 F (37.1 C) (Oral)   Resp 20   Ht 4\' 10"  (1.473 m)   Wt 54.8 kg   SpO2 94%   BMI 25.25 kg/m  Pain Scale: 0-10 POSS *See Group Information*: S-Acceptable,Sleep, easy to arouse Pain Score: 0-No pain   SpO2: SpO2: 94 % O2 Device:SpO2: 94 % O2 Flow Rate: .O2 Flow Rate (L/min): 5 L/min  IO: Intake/output summary:   Intake/Output Summary (Last 24 hours) at 12/01/2018 1235 Last data filed at 12/01/2018 0900 Gross per 24 hour  Intake 1086.41 ml  Output 1200 ml  Net -113.59 ml    LBM: Last BM Date: 11/30/18 Baseline Weight: Weight: 51.7 kg(10/16/2018) Most recent weight: Weight: 54.8 kg     Palliative Assessment/Data:   Flowsheet Rows     Most Recent Value  Intake Tab  Referral Department  Pulmonary  Unit at Time of Referral  Med/Surg Unit  Palliative Care Primary Diagnosis  Pulmonary  Palliative Care Type  New Palliative care  Reason for referral  Clarify Goals of Care  Date first seen by Palliative Care  12/01/18  Clinical Assessment  Palliative Performance Scale Score  30%  Pain Max last 24 hours  4  Pain Min Last 24 hours  3  Dyspnea Max Last 24 Hours  6  Dyspnea Min Last 24 hours  5  Nausea Max Last 24 Hours  4  Nausea Min Last 24 Hours  3  Psychosocial & Spiritual Assessment  Palliative Care Outcomes  Patient/Family meeting held?  Yes  Who was at the meeting?  patient in person, also discussed seperately with both son and daughter on the phone.      Time In:  1300 Time Out:  1410 Time Total:  70 min  Greater than 50%  of this time was spent counseling and coordinating care related to the above assessment and plan.  Signed by: Loistine Chance, MD [phone]   Please  contact Palliative Medicine Team phone at [phone] for questions and concerns.  For individual provider: See Shea Evans

## 2018-12-01 NOTE — Evaluation (Signed)
Occupational Therapy Evaluation Patient Details Name: Heather Barnes MRN: [id number] DOB: [birth year] Today's Date: 12/01/2018    History of Present Illness pt was admitted for respiratory failure/pna.  PMH:  COPD   Clinical Impression   This 85 year old female was admitted for the above.  At baseline, she lives with her daughter, walks to bathroom and sponge bathes. Her daughter assists with dressing. Pt was able to tolerate sitting EOB for sponge bath today. She fatiques easily and WOB is increased despite sats in the 90s on 5 liters HFNC. Will follow in acute setting with min guard level goals.     Follow Up Recommendations  SNF vs home with 24/7   Equipment Recommendations  3 in 1 bedside commode    Recommendations for Other Services       Precautions / Restrictions Precautions Precautions: Fall Restrictions Weight Bearing Restrictions: No      Mobility Bed Mobility Overal bed mobility: Needs Assistance Bed Mobility: Supine to Sit;Sit to Supine     Supine to sit: Mod assist;+2 for safety/equipment Sit to supine: Mod assist;+2 for safety/equipment   General bed mobility comments: pt moved legs off bed and partially rolled, using rail to sit up with assist for trunk.  Assist for legs back to bed  Transfers Overall transfer level: Needs assistance Equipment used: Rolling walker (2 wheeled) Transfers: Sit to/from Stand Sit to Stand: Min assist;+2 safety/equipment         General transfer comment: stood twice for adls and to sit step up [place]                                           ADL either performed or assessed with clinical judgement   ADL Overall ADL's : Needs assistance/impaired Eating/Feeding: Independent   Grooming: Set up   Upper Body Bathing: Set up   Lower Body Bathing: Maximal assistance   Upper Body Dressing : Minimal assistance   Lower Body Dressing: Total assistance                 General ADL  Comments: performed UB bathing and peri care from EOB.  Pt participated well but does fatique easily.  Sats 93-97% on 5 liters HFNC.  Dyspnea 2/4     Vision         Perception     Praxis      Pertinent Vitals/Pain Pain Assessment: No/denies pain     Hand Dominance Right   Extremity/Trunk Assessment Upper Extremity Assessment Upper Extremity Assessment: Generalized weakness           Communication Communication Communication: HOH   Cognition Arousal/Alertness: Awake/alert Behavior During Therapy: WFL for tasks assessed/performed Overall Cognitive Status: Within Functional Limits for tasks assessed                                     General Comments       Exercises     Shoulder Instructions      Home Living Family/patient expects to be discharged to:: Private residence Living Arrangements: Children Available Help at Discharge: Family Type of Home: House Home Access: Stairs to enter Technical brewer of Steps: 4 Entrance Stairs-Rails: Right Home Layout: One level  Home Equipment: [place] - 4 wheels;Wheelchair - manual   Additional Comments: on O2 at home      Prior Functioning/Environment Level of Independence: Needs assistance  Gait / Transfers Assistance Needed: furniture walks at home ADL's / Homemaking Assistance Needed: daughter would help her dress, she sponge bathed            OT Problem List: Decreased strength;Decreased activity tolerance;Impaired balance (sitting and/or standing);Cardiopulmonary status limiting activity;Decreased knowledge of use of DME or AE      OT Treatment/Interventions: Self-care/ADL training;Energy conservation;DME and/or AE instruction;Therapeutic activities;Patient/family education;Balance training    OT Goals(Current goals can be found in the care plan section) Acute Rehab OT Goals Patient Stated Goal: regain strength OT Goal Formulation: With patient Time For Goal  Achievement: 12/15/18 Potential to Achieve Goals: Good ADL Goals Pt Will Transfer to Toilet: with min guard assist;bedside commode;stand pivot transfer Pt Will Perform Toileting - Clothing Manipulation and hygiene: with min guard assist;sitting/lateral leans;sit to/from stand Additional ADL Goal #1: pt will go from sit to stand with min guard for adls Additional ADL Goal #2: pt will initiate at least one rest break without cues for energy conservation  OT Frequency: Min 2X/week   Barriers to D/C:            Co-evaluation              AM-PAC OT "6 Clicks" Daily Activity     Outcome Measure Help from another person eating meals?: A Little Help from another person taking care of personal grooming?: A Little Help from another person toileting, which includes using toliet, bedpan, or urinal?: A Lot Help from another person bathing (including washing, rinsing, drying)?: A Lot Help from another person to put on and taking off regular upper body clothing?: A Little Help from another person to put on and taking off regular lower body clothing?: Total 6 Click Score: 14   End of Session    Activity Tolerance: Patient limited by fatigue Patient left: in bed;with call bell/phone within reach;with bed alarm set  OT Visit Diagnosis: Muscle weakness (generalized) (M62.81);Unsteadiness on feet (R26.81)                Time: 4239-5320 OT Time Calculation (min): 22 min Charges:  OT General Charges $OT Visit: 1 Visit OT Evaluation $OT Eval Moderate Complexity: [place], OTR/L Acute Rehabilitation Services [phone] [facility] pager [phone] office 12/01/2018  [place] 12/01/2018, 11:19 AM

## 2018-12-01 NOTE — Evaluation (Signed)
Physical Therapy Evaluation Patient Details Name: Heather Barnes MRN: [id number] DOB: 12-03-1933 Today's Date: 12/01/2018   History of Present Illness  pt was admitted for respiratory failure/pna, COPD exacerbation, intubated 6/19-6/21.  QQP:YPPJKD COPD with emphysema, chronic hypoxic/hypercapnic respiratory failure on 3 liters supplemental oxygen, Lung cancer s/p XRT, Breast cancer  Clinical Impression  Patient presents with decreased mobility due to decreased strength, decreased activity tolerance, decreased balance and decreased cardiopulmonary endurance.  She may benefit from skilled PT in the acute setting to maximize mobility for d/c home with family and (likely) Hospice assist versus to SNF if family unable to assist.    Follow Up Recommendations Supervision/Assistance - 24 hour;No PT follow up;SNF(I think planning on going home with Hospice per MD note?)    Equipment Recommendations  3in1 (PT)    Recommendations for Other Services       Precautions / Restrictions Precautions Precautions: Fall Precaution Comments: oxygen dependent Restrictions Weight Bearing Restrictions: No      Mobility  Bed Mobility Overal bed mobility: Needs Assistance Bed Mobility: Supine to Sit;Sit to Supine     Supine to sit: Mod assist;+2 for safety/equipment Sit to supine: Mod assist;+2 for safety/equipment   General bed mobility comments: pt moved legs off bed and partially rolled, using rail to sit up with assist for trunk.  Assist for legs back to bed  Transfers Overall transfer level: Needs assistance Equipment used: Rolling walker (2 wheeled) Transfers: Sit to/from Stand Sit to Stand: Min assist;+2 safety/equipment         General transfer comment: stood twice for adls and to scoot up [facility]  Ambulation/Gait                Stairs            Wheelchair Mobility    Modified Rankin (Stroke Patients Only)       Balance Overall balance assessment: Needs  assistance Sitting-balance support: Feet supported Sitting balance-Leahy Scale: Fair Sitting balance - Comments: seated for combing her hair at EOB   Standing balance support: Bilateral upper extremity supported Standing balance-Leahy Scale: Poor Standing balance comment: UE support needed for balance                             Pertinent Vitals/Pain Pain Assessment: No/denies pain    Home Living Family/patient expects to be discharged to:: Private residence Living Arrangements: Children Available Help at Discharge: Family Type of Home: House Home Access: Stairs to enter Entrance Stairs-Rails: Right Entrance Stairs-Number of Steps: 4 Home Layout: One level Home Equipment: Walker - 4 wheels;Wheelchair - manual Additional Comments: on O2 at home    Prior Function Level of Independence: Needs assistance   Gait / Transfers Assistance Needed: furniture walks at home  ADL's / Homemaking Assistance Needed: daughter would help her dress, she sponge bathed        Hand Dominance   Dominant Hand: Right    Extremity/Trunk Assessment   Upper Extremity Assessment Upper Extremity Assessment: Generalized weakness    Lower Extremity Assessment Lower Extremity Assessment: Generalized weakness    Cervical / Trunk Assessment Cervical / Trunk Assessment: Kyphotic  Communication   Communication: HOH  Cognition Arousal/Alertness: Awake/alert Behavior During Therapy: WFL for tasks assessed/performed Overall Cognitive Status: Within Functional Limits for tasks assessed  General Comments      Exercises     Assessment/Plan    PT Assessment Patient needs continued PT services  PT Problem List Decreased strength;Decreased activity tolerance;Decreased mobility;Cardiopulmonary status limiting activity;Decreased balance       PT Treatment Interventions DME instruction;Gait training;Functional mobility  training;Therapeutic exercise;Stair training;Therapeutic activities;Balance training;Patient/family education    PT Goals (Current goals can be found in the Care Plan section)  Acute Rehab PT Goals Patient Stated Goal: regain strength PT Goal Formulation: With patient Time For Goal Achievement: 12/15/18 Potential to Achieve Goals: Fair    Frequency Min 3X/week   Barriers to discharge Decreased caregiver support daughter on disability as well    Co-evaluation PT/OT/SLP Co-Evaluation/Treatment: Yes Reason for Co-Treatment: Complexity of the patient's impairments (multi-system involvement) PT goals addressed during session: Mobility/safety with mobility;Balance;Strengthening/ROM         AM-PAC PT "6 Clicks" Mobility  Outcome Measure Help needed turning from your back to your side while in a flat bed without using bedrails?: A Little Help needed moving from lying on your back to sitting on the side of a flat bed without using bedrails?: A Lot Help needed moving to and from a bed to a chair (including a wheelchair)?: A Lot Help needed standing up from a chair using your arms (e.g., wheelchair or bedside chair)?: A Lot Help needed to walk in hospital room?: Total Help needed climbing 3-5 steps with a railing? : Total 6 Click Score: 11    End of Session Equipment Utilized During Treatment: Oxygen Activity Tolerance: Patient limited by fatigue Patient left: in bed;with bed alarm set;with call bell/phone within reach Nurse Communication: Mobility status PT Visit Diagnosis: Other abnormalities of gait and mobility (R26.89);Muscle weakness (generalized) (M62.81)    Time: 1010-1033 PT Time Calculation (min) (ACUTE ONLY): 23 min   Charges:   PT Evaluation $PT Eval Moderate Complexity: [place], [place] Acute Rehabilitation Services [phone] 12/01/2018   Reginia Naas 12/01/2018, 12:32 PM

## 2018-12-01 NOTE — Care Management Important Message (Signed)
Important Message  Patient Details IM Letter given to Servando Snare SW to present to the Patient Name: Heather Barnes MRN: [id number] Date of Birth: [birth year]   Medicare Important Message Given:  Yes     Kerin Salen 12/01/2018, 12:05 PM

## 2018-12-01 NOTE — Progress Notes (Signed)
NAME:  Heather Barnes, MRN:  [id number], DOB:  1933/07/05, LOS: 7 ADMISSION DATE:  11/24/2018, CONSULTATION DATE: 11/24/2018 REFERRING MD:  Dr. Tyrone Nine, ER, CHIEF COMPLAINT:  Short of breath   Brief History   38 yowf  MM quit smoking 2000  with COPD GOLD III spirometry  with very low dlco as of 02/2011 pft admit  with  exacerbation, acute on chronic hypercapnic/hypoxic respiratory failure from pneumonia after failed outpt therapy with prednisone.     History of present illness   85 yo female was seen in pulmonary office on 11/13/18 with shortness of breath, nasal congestion, headache and low grade temperature up to 58F.  She was treated with prednisone.  She developed fatigue, nausea and weakness.  Her breathing symptoms got worse and she presented to ER.  ABG showed hypoxia and hypercapnia.  Tx with steroids, magnesium, and nebs.  Failed to improve and intubated.  CXR showed pneumonia and started on ABX.  COVID test negative.  Past Medical History  Severe COPD with emphysema, chronic hypoxic/hypercapnic respiratory failure on 3 liters supplemental oxygen, Lung cancer s/p XRT, Breast cancer  Significant Hospital Events   6/19 Admit   Consults:    Procedures:  ETT 6/19 , extubated 11/26/2018  Significant Diagnostic Tests:      Micro Data:  COVID 6/19 >> neg  MRSA  6/19 neg  Blood 6/19 >> neg x 2       Antimicrobials:  Vancomycin 6/19 -6/20 Zosyn 6/19 -  D/c'd 6/24   Interim history/subjective:  Now on 4 E / some bloody sputum overnight/ flutter valve helping  Objective   Blood pressure (!) 140/48, pulse 93, temperature 98.7 F (37.1 C), temperature source Oral, resp. rate 20, height 4\' 10"  (1.473 m), weight 54.8 kg, SpO2 97 %.    On 5lpm NP     Intake/Output Summary (Last 24 hours) at 12/01/2018 0752 Last data filed at 12/01/2018 0511 Gross per 24 hour  Intake 726.41 ml  Output 1200 ml  Net -473.59 ml   Filed Weights   11/28/18 0500 11/29/18 0346 12/01/18 0458   Weight: 54.6 kg 55.4 kg 54.8 kg   Examination:   Chronically (not acutely) ill wf lying almost motionless in bed/ rattling cough but no increased wob Pt alert, approp  But hard of hearing No jvd Oropharynx clear,  mucosa nl Neck supple Lungs with a few scattered exp > insp rhonchi bilaterally r > l no true wheeze  RRR no s3 or or sign murmur Abd soft Extr warm with no edema or clubbing noted Neuro nl sensorium  no apparent motor deficits       Assessment & Plan:   Acute on chronic hypoxic/hypercapnic respiratory failure from pneumonia/ no better on serial cxr's sp zosyn x 5 days d/c'd  COPD exacerbation Severe COPD Rec:  PA and Lat cxr today Up with pt/ ot if possible Try prednisone 20 mg daily for airways/ appetite/ energy Hospice referral discussed with pt as means for her to get home and stay home safely  s immediate return  I told her it was very likely medical science had done all it could here       Metabolic encephalopathy Agitation/anxiety Continues Alert and approp     Hypertension, hyperlipidemia added bisoprolol 6/26 -  continue crestor   Hemoptysis on lovenox - now that out of ICU will d/c lovenox   Plan for 6/26 :  Mobilize with pt/ot input, hospice consult  Labs   CBC: Recent Labs  Lab 11/25/18 0630 11/28/18 0226 11/29/18 1059 11/30/18 0235  WBC 28.5* 38.7* 26.4* 23.6*  NEUTROABS  --  34.5* 23.2* 20.6*  HGB 10.0* 10.7* 10.2* 9.7*  HCT 32.4* 35.7* 34.0* 32.4*  MCV 89.0 90.2 91.6 92.3  PLT 290 340 317 626    Basic Metabolic Panel: Recent Labs  Lab 11/24/18 0951 11/24/18 1716 11/25/18 0630 11/25/18 1703 11/26/18 0156 11/27/18 0158 11/28/18 0226 11/30/18 0235  NA  --   --  138  --   --   --  142 142  K  --   --  3.7  --   --   --  4.9 4.0  CL  --   --  96*  --   --   --  100 98  CO2  --   --  32  --   --   --  28 38*  GLUCOSE  --   --  183*  --   --   --  127* 118*  BUN  --   --  16  --   --   --  20 13   CREATININE  --   --  0.34*  --  0.36* 0.38* 0.36* 0.36*  CALCIUM  --   --  8.1*  --   --   --  8.2* 8.4*  MG 2.5* 2.4 2.4 2.5*  --   --   --   --   PHOS 3.2 2.3* 2.4* 2.2*  --   --   --   --    GFR: Estimated Creatinine Clearance: 39.1 mL/min (A) (by C-G formula based on SCr of 0.36 mg/dL (L)). Recent Labs  Lab 11/25/18 0630 11/28/18 0226 11/29/18 1059 11/30/18 0235  WBC 28.5* 38.7* 26.4* 23.6*    Liver Function Tests: No results for input(s): AST, ALT, ALKPHOS, BILITOT, PROT, ALBUMIN in the last 168 hours. No results for input(s): LIPASE, AMYLASE in the last 168 hours. No results for input(s): AMMONIA in the last 168 hours.  ABG    Component Value Date/Time   PHART 7.352 11/25/2018 0846   PCO2ART 66.0 (HH) 11/25/2018 0846   PO2ART 68.9 (L) 11/25/2018 0846   HCO3 35.7 (H) 11/25/2018 0846   TCO2 31 03/31/2016 0758   ACIDBASEDEF 5.2 (H) 11/14/2017 1040   O2SAT 92.7 11/25/2018 0846     Coagulation Profile: No results for input(s): INR, PROTIME in the last 168 hours.  Cardiac Enzymes: No results for input(s): CKTOTAL, CKMB, CKMBINDEX, TROPONINI in the last 168 hours.  HbA1C: Hgb A1c MFr Bld  Date/Time Value Ref Range Status  11/25/2018 06:30 AM 5.6 4.8 - 5.6 % Final    Comment:    (NOTE)         Prediabetes: 5.7 - 6.4         Diabetes: >6.4         Glycemic control for adults with diabetes: <7.0   06/11/2015 08:20 AM 5.8 (H) 4.8 - 5.6 % Final    Comment:    (NOTE)         Pre-diabetes: 5.7 - 6.4         Diabetes: >6.4         Glycemic control for adults with diabetes: <7.0     CBG: Recent Labs  Lab 11/30/18 0808 11/30/18 2058 12/01/18 0038 12/01/18 0454 12/01/18 0745  GLUCAP 93 147* 118* 106* 113[name]  Melvyn Novas, MD Pulmonary and [place] [phone] After 5:30 PM or weekends, use Beeper [phone]

## 2018-12-01 NOTE — Telephone Encounter (Signed)
Spoke with pt's Heather Barnes. Advised him that he would need to contact [facility] for an update on his mother. He verbalized understanding. Nothing further was needed.

## 2018-12-01 NOTE — Progress Notes (Signed)
Pt. made RT aware that she has been coughing up some blood, RN made aware.

## 2018-12-01 NOTE — Progress Notes (Signed)
Patient currently requesting to eat at this time. No distress noted at this time. Will attempt scheduled txs at another time.

## 2018-12-01 NOTE — Progress Notes (Signed)
  Speech Language Pathology Treatment: Dysphagia  Patient Details Name: Heather Barnes MRN: [id number] DOB: 11/29/1933 Today's Date: 12/01/2018 Time: 4818-5631 SLP Time Calculation (min) (ACUTE ONLY): 35 min  Assessment / Plan / Recommendation Clinical Impression  Pt admits that just eating her soup "took it out of (her)" states she wants to continue her full liquid diet at this time; SLP reiterated aspiration precautions to mitigate her risks and provided written compensation strategies; Note plan for possible home with hospice and thus advised would recommend to advance diet as pt desires to maximize her QOL.  Pt's dyspnea will prevent her from receiving adequate nutrition.  She remains high aspiration risk regardless of diet consistency.  After definitive plans in place, would recommend advance as pt desires.  In the interim, continue diet for energy conservation and to maximize nutrition.  No SLP follow up indicated as all education completed.      HPI HPI: 85 yo female was seen in pulmonary office on 11/13/18 with shortness of breath, nasal congestion, headache and low grade temperature up to 39F.  She was treated with prednisone.  She developed fatigue, nausea and weakness.  Her breathing symptoms got worse and she presented to ER.  ABG showed hypoxia and hypercapnia.  Tx with steroids, magnesium, and nebs.  Failed to improve and intubated.  CXR showed pneumonia and started on ABX.  COVID test negative.      SLP Plan  Continue with current plan of care       Recommendations  Diet recommendations: (full liquids and advance as pt wishes/tolerates) Liquids provided via: Cup;No straw;Teaspoon Medication Administration: Crushed with puree(crush with icecream) Supervision: Full supervision/cueing for compensatory strategies;Staff to assist with self feeding Compensations: Minimize environmental distractions;Slow rate;Small sips/bites;Chin tuck Postural Changes and/or Swallow Maneuvers:  Seated upright 90 degrees;Upright 30-60 min after meal                Oral Care Recommendations: Oral care QID Follow up Recommendations: Other (comment)(tbd) SLP Visit Diagnosis: Dysphagia, unspecified (R13.10) Plan: Continue with current plan of care       [place], Bethannie Iglehart Ann 12/01/2018, 6:45 PM  Luanna Salk, [facility] SLP Acute Rehab Services Pager [phone] Office [phone]

## 2018-12-01 NOTE — Progress Notes (Signed)
CPT was only able to be done with Flutter after scheduled aerosol nebulizer, Chest Vest placed in room to be attempted to better help pt. with mucus clearance, pt. becomes increasingly short of breath and tachypnic from use of flutter at this time, will continue to monitor for better opportunity for chest vest trials.

## 2018-12-02 LAB — GLUCOSE, CAPILLARY
Glucose-Capillary: 104 mg/dL — ABNORMAL HIGH (ref 70–99)
Glucose-Capillary: 109 mg/dL — ABNORMAL HIGH (ref 70–99)
Glucose-Capillary: 121 mg/dL — ABNORMAL HIGH (ref 70–99)
Glucose-Capillary: 135 mg/dL — ABNORMAL HIGH (ref 70–99)

## 2018-12-02 NOTE — Progress Notes (Signed)
Pt unable to tolerated Flutter valve at this time.  Pt gets SOB with exertion very easy, RT to monitor and assess as needed.

## 2018-12-02 NOTE — Progress Notes (Signed)
Daily Progress Note   Patient Name: Heather Barnes       Date: 12/02/2018 DOB: 08/06/1933  Age: 85 y.o. MRN#: [id number] Attending Physician: Laurin Coder, MD Primary Care Physician: [name], MD Admit Date: 11/24/2018  Reason for Consultation/Follow-up: Establishing goals of care 85 yo lady who lives at home with her daughter, has severe COPD with emphysema, lung cancer s/p XRT, history of breast cancer. She is oxygen dependent, is on 3L  pretty much on a 247 basis. Patient sees Dr Chase Caller in the outpatient setting for her pulmonary follow up.   Patient has been admitted with COPD exacerbation, recent recurrent pneumonia, worsening acute on chronic hypercapnic/hypoxic resp failure.   She even required intubation in this hospitalization, she is now extubated. SLP has been following, patient on liquid diet.   A palliative consult has been requested for ongoing goals of care discussions.   Subjective: Not as alert, arouses some, doesn't engage as much today. Discussed with bedside RN who states that the patient has been tiring out easily, with the slightest exertion. PO intake remains poor.   Length of Stay: 8  Current Medications: Scheduled Meds:  . bisoprolol  5 mg Oral Daily  . feeding supplement (ENSURE ENLIVE)  237 mL Oral BID BM  . fluticasone  2 spray Each Nare Daily  . ipratropium-albuterol  3 mL Nebulization QID  . montelukast  10 mg Per Tube QHS  . multivitamin with minerals  1 tablet Oral Daily  . pantoprazole  40 mg Oral QAC breakfast  . predniSONE  20 mg Oral Q breakfast  . rosuvastatin  10 mg Per Tube QODAY    Continuous Infusions: . sodium chloride 50 mL/hr at 12/02/18 0707  . sodium chloride Stopped (11/30/18 1240)    PRN Meds: sodium  chloride, albuterol, bisacodyl, docusate, ondansetron (ZOFRAN) IV, phenol  Physical Exam         Frail lady Resting in bed Shallow regular work of breathing Regular No edema Some muscle wasting Abdomen not distended  Vital Signs: BP (!) 143/52 (BP Location: Left Arm)   Pulse 70   Temp 98 F (36.7 C) (Oral)   Resp 14   Ht 4\' 10"  (1.473 m)   Wt 53.5 kg   SpO2 96%   BMI 24.65 kg/m  SpO2: SpO2: 96 %  O2 Device: O2 Device: Nasal Cannula O2 Flow Rate: O2 Flow Rate (L/min): 5 L/min  Intake/output summary:   Intake/Output Summary (Last 24 hours) at 12/02/2018 1344 Last data filed at 12/02/2018 0800 Gross per 24 hour  Intake 240 ml  Output 400 ml  Net -160 ml   LBM: Last BM Date: 11/30/18 Baseline Weight: Weight: 51.7 kg(10/16/2018) Most recent weight: Weight: 53.5 kg       Palliative Assessment/Data:    Flowsheet Rows     Most Recent Value  Intake Tab  Referral Department  Pulmonary  Unit at Time of Referral  Med/Surg Unit  Palliative Care Primary Diagnosis  Pulmonary  Palliative Care Type  New Palliative care  Reason for referral  Clarify Goals of Care  Date first seen by Palliative Care  12/01/18  Clinical Assessment  Palliative Performance Scale Score  30%  Pain Max last 24 hours  4  Pain Min Last 24 hours  3  Dyspnea Max Last 24 Hours  6  Dyspnea Min Last 24 hours  5  Nausea Max Last 24 Hours  4  Nausea Min Last 24 Hours  3  Psychosocial & Spiritual Assessment  Palliative Care Outcomes  Patient/Family meeting held?  Yes  Who was at the meeting?  patient in person, also discussed seperately with both son and daughter on the phone.      Patient Active Problem List   Diagnosis Date Noted  . Encounter for palliative care   . Goals of care, counseling/discussion   . Shortness of breath   . Acute on chronic respiratory failure with hypoxia and hypercapnia ([place]) 11/30/2018  . Sinusitis 12/12/2017  . Ductal carcinoma in situ (DCIS) of right breast 03/10/2017   . Coronary artery calcification 09/01/2016  . Bilateral low back pain with sciatica 05/10/2016  . Bronchopneumonia 05/10/2016  . CAP (community acquired pneumonia) 03/31/2016  . Chest pain 03/31/2016  . Tachycardia 03/31/2016  . PVC (premature ventricular contraction) 02/24/2016  . Palpitations 01/19/2016  . Respiratory failure ([place]) 06/11/2015  . Acute on chronic respiratory failure ([place]) 06/11/2015  . Knee pain, right 07/02/2014  . Chronic respiratory failure with hypoxia ([place]) 07/02/2014  . COPD, very severe ([place]) 07/02/2014  . COPD exacerbation ([place]) 06/13/2013  . Right rib fracture 05/15/2012  . Fatigue 07/22/2011  . Oral thrush 07/22/2011  . Lung cancer, middle lobe ([place]) 04/26/2011  . COPD (chronic obstructive pulmonary disease) ([place]) 01/13/2011    Palliative Care Assessment & Plan   Patient Profile:    Assessment:    Recommendations/Plan:   continue current mode of care  Will discuss with patient and family (son and daughter) in am about appropriate disposition options: home with hospice versus residential hospice, based on patient's hospital course over the weekend.     Code Status: Code Status History    Date Active Date Inactive Code Status Order ID Comments User Context   11/24/2018 0738 11/29/2018 1425 Full Code [id number]  Chesley Mires, MD ED   04/11/2017 1328 04/12/2017 1309 Full Code [id number]  Rolm Bookbinder, MD Inpatient   03/31/2016 0925 04/06/2016 1814 Full Code [id number]  Waldemar Dickens, MD ED   03/31/2016 0925 03/31/2016 0925 Full Code [id number]  Waldemar Dickens, MD ED   06/11/2015 1001 06/18/2015 2022 Full Code [id number]  Erick Colace, NP ED   06/11/2015 1001 06/11/2015 1001 Full Code [id number]  Erick Colace, NP ED   Advance Care Planning Activity    Advance Directive Documentation  Most Recent Value  Type of Advance Directive  Healthcare Power of Attorney, Living will  Pre-existing out of facility DNR order (yellow form or pink MOST  form)  -  "MOST" Form in Place?  -       Prognosis:   guarded ?few weeks.   Discharge Planning:  To Be Determined  Care plan was discussed with  RN  Thank you for allowing the Palliative Medicine Team to assist in the care of this patient.   Time In: 1300 Time Out: 1325 Total Time 25 Prolonged Time Billed  no       Greater than 50%  of this time was spent counseling and coordinating care related to the above assessment and plan.  Loistine Chance, MD [phone] Please contact Palliative Medicine Team phone at [phone] for questions and concerns.

## 2018-12-02 NOTE — Progress Notes (Signed)
NAME:  Heather Barnes, MRN:  [id number], DOB:  07/03/1933, LOS: 8 ADMISSION DATE:  11/24/2018, CONSULTATION DATE: 11/24/2018 REFERRING MD:  Dr. Tyrone Nine, ER, CHIEF COMPLAINT:  Short of breath   Brief History   47 yowf  MM quit smoking 2000  with COPD GOLD III spirometry  with very low dlco as of 02/2011  admit  with  exacerbation, acute on chronic hypercapnic/hypoxic respiratory failure from pneumonia after failed outpt therapy with prednisone.     History of present illness   85 yo female was seen in pulmonary office on 11/13/18 with shortness of breath, nasal congestion, headache and low grade temperature up to 39F.  She was treated with prednisone.  She developed fatigue, nausea and weakness.  Her breathing symptoms got worse and she presented to ER.  ABG showed hypoxia and hypercapnia.  Tx with steroids, magnesium, and nebs.  Failed to improve and intubated.  CXR showed pneumonia and started on ABX.  COVID test negative.  Past Medical History  Severe COPD with emphysema, chronic hypoxic/hypercapnic respiratory failure on 3 liters supplemental oxygen, Lung cancer s/p XRT, Breast cancer  Significant Hospital Events   6/19 Admit   Consults:    Procedures:  ETT 6/19 , extubated 11/26/2018  Significant Diagnostic Tests:      Micro Data:  COVID 6/19 >> neg  MRSA  6/19 neg  Blood 6/19 >> neg x 2       Antimicrobials:  Vancomycin 6/19 -6/20 Zosyn 6/19 -  D/c'd 6/24   Interim history/subjective:  Looks much less vibrant today, very weak cough with poor mechanics  Objective   Blood pressure (!) 143/52, pulse 70, temperature 98 F (36.7 C), temperature source Oral, resp. rate 14, height 4\' 10"  (1.473 m), weight 53.5 kg, SpO2 98 %.    On 5lpm NP     Intake/Output Summary (Last 24 hours) at 12/02/2018 1158 Last data filed at 12/02/2018 0800 Gross per 24 hour  Intake 240 ml  Output 400 ml  Net -160 ml   Filed Weights   11/29/18 0346 12/01/18 0458 12/02/18 0437  Weight: 55.4  kg 54.8 kg 53.5 kg   Examination: Chronically ill / pale lying in fetal position Pt alert, approp   No jvd Oropharynx clear,  mucosa nl Neck supple Lungs with very distant bs s bronchial changes/ wheezes RRR no s3 or or sign murmur Abd soft/ limted excursion  Extr warm with no edema or clubbing noted Neuro  Sensorium intact/ no obvious motor deficits     Assessment & Plan:   Acute on chronic hypoxic/hypercapnic respiratory failure from pneumonia/ no better on serial cxr's sp zosyn x 5 days d/c'd  COPD exacerbation Severe COPD cxr is no better with RUL post atelectasis and extensive residual changes in both lungs  Rec:  No further cxr's or interventions being considered at this point as the main issue is poor cough mechanics/ retained secretions and not "treatable infection" Continue  prednisone 20 mg daily for airways/ appetite/ energy Hospice referral done - pt/ot feel the only way she can go home is with hosice/fm care 60/7     Metabolic encephalopathy Agitation/anxiety Continues Alert and approp     Hypertension, hyperlipidemia added bisoprolol 6/26  > Adequate control on present rx   continue crestor    Hemoptysis on lovenox d/c'd 6/26  -  Resolved off lovenex as of 6/27   Plan for 6/27 :  Continue efforst to Mobilize / home with hospice ? 6/29  Christinia Gully, MD Pulmonary and [place] Cell [phone] After 5:30 PM or weekends, use Beeper [phone]            Labs   CBC: Recent Labs  Lab 11/28/18 0226 11/29/18 1059 11/30/18 0235  WBC 38.7* 26.4* 23.6*  NEUTROABS 34.5* 23.2* 20.6*  HGB 10.7* 10.2* 9.7*  HCT 35.7* 34.0* 32.4*  MCV 90.2 91.6 92.3  PLT 340 317 854    Basic Metabolic Panel: Recent Labs  Lab 11/25/18 1703 11/26/18 0156 11/27/18 0158 11/28/18 0226 11/30/18 0235  NA  --   --   --  142 142  K  --   --   --  4.9 4.0  CL  --   --   --  100 98  CO2  --   --   --  28 38*  GLUCOSE  --    --   --  127* 118*  BUN  --   --   --  20 13  CREATININE  --  0.36* 0.38* 0.36* 0.36*  CALCIUM  --   --   --  8.2* 8.4*  MG 2.5*  --   --   --   --   PHOS 2.2*  --   --   --   --    GFR: Estimated Creatinine Clearance: 38.6 mL/min (A) (by C-G formula based on SCr of 0.36 mg/dL (L)). Recent Labs  Lab 11/28/18 0226 11/29/18 1059 11/30/18 0235  WBC 38.7* 26.4* 23.6*    Liver Function Tests: No results for input(s): AST, ALT, ALKPHOS, BILITOT, PROT, ALBUMIN in the last 168 hours. No results for input(s): LIPASE, AMYLASE in the last 168 hours. No results for input(s): AMMONIA in the last 168 hours.  ABG    Component Value Date/Time   PHART 7.352 11/25/2018 0846   PCO2ART 66.0 (HH) 11/25/2018 0846   PO2ART 68.9 (L) 11/25/2018 0846   HCO3 35.7 (H) 11/25/2018 0846   TCO2 31 03/31/2016 0758   ACIDBASEDEF 5.2 (H) 11/14/2017 1040   O2SAT 92.7 11/25/2018 0846     Coagulation Profile: No results for input(s): INR, PROTIME in the last 168 hours.  Cardiac Enzymes: No results for input(s): CKTOTAL, CKMB, CKMBINDEX, TROPONINI in the last 168 hours.  HbA1C: Hgb A1c MFr Bld  Date/Time Value Ref Range Status  11/25/2018 06:30 AM 5.6 4.8 - 5.6 % Final    Comment:    (NOTE)         Prediabetes: 5.7 - 6.4         Diabetes: >6.4         Glycemic control for adults with diabetes: <7.0   06/11/2015 08:20 AM 5.8 (H) 4.8 - 5.6 % Final    Comment:    (NOTE)         Pre-diabetes: 5.7 - 6.4         Diabetes: >6.4         Glycemic control for adults with diabetes: <7.0     CBG: Recent Labs  Lab 12/01/18 1609 12/01/18 2022 12/02/18 0002 12/02/18 0432 12/02/18 0806  GLUCAP 155* 94 121* 109* 104*           Christinia Gully, MD Pulmonary and [place] [phone] After 5:30 PM or weekends, use Beeper [phone]

## 2018-12-02 NOTE — Progress Notes (Signed)
PT unable to withstand CPT at this time.

## 2018-12-03 DIAGNOSIS — R0602 Shortness of breath: Secondary | ICD-10-CM

## 2018-12-03 LAB — EXPECTORATED SPUTUM ASSESSMENT W GRAM STAIN, RFLX TO RESP C

## 2018-12-03 MED ORDER — LEVOFLOXACIN 500 MG PO TABS
500.0000 mg | ORAL_TABLET | Freq: Once | ORAL | Status: AC
  Administered 2018-12-03: 500 mg via ORAL
  Filled 2018-12-03: qty 1

## 2018-12-03 MED ORDER — LEVOFLOXACIN 500 MG PO TABS: 500.0000 mg | ORAL_TABLET | Freq: Every day | ORAL | Status: DC

## 2018-12-03 MED ORDER — LEVOFLOXACIN 250 MG PO TABS
250.0000 mg | ORAL_TABLET | Freq: Every day | ORAL | Status: DC
  Administered 2018-12-04 – 2018-12-05 (×2): 250 mg via ORAL
  Filled 2018-12-03 (×2): qty 1

## 2018-12-03 MED ORDER — MORPHINE SULFATE (CONCENTRATE) 10 MG/0.5ML PO SOLN: 2.5000 mg | ORAL | Status: DC | PRN

## 2018-12-03 MED ORDER — GUAIFENESIN ER 600 MG PO TB12
600.0000 mg | ORAL_TABLET | Freq: Two times a day (BID) | ORAL | Status: DC
  Administered 2018-12-03 – 2018-12-05 (×5): 600 mg via ORAL
  Filled 2018-12-03 (×5): qty 1

## 2018-12-03 NOTE — Progress Notes (Signed)
Daily Progress Note   Patient Name: Heather Barnes       Date: 12/03/2018 DOB: 09/21/1933  Age: 85 y.o. MRN#: [id number] Attending Physician: Laurin Coder, MD Primary Care Physician: [name], MD Admit Date: 11/24/2018  Reason for Consultation/Follow-up: Establishing goals of care 85 yo lady who lives at home with her daughter, has severe COPD with emphysema, lung cancer s/p XRT, history of breast cancer. She is oxygen dependent, is on 3L  pretty much on a 247 basis. Patient sees Dr Chase Caller in the outpatient setting for her pulmonary follow up.   Patient has been admitted with COPD exacerbation, recent recurrent pneumonia, worsening acute on chronic hypercapnic/hypoxic resp failure.   She even required intubation in this hospitalization, she is now extubated. SLP has been following, patient on liquid diet.   A palliative consult has been requested for ongoing goals of care discussions.   Subjective: Not as alert, arouses some, doesn't engage as much today. Discussed with bedside RN who states that the patient has been tiring out easily, with the slightest exertion. PO intake remains poor.  Patient also becomes dyspneic after talking on the phone with her family very quickly.  Length of Stay: 9  Current Medications: Scheduled Meds:   bisoprolol  5 mg Oral Daily   feeding supplement (ENSURE ENLIVE)  237 mL Oral BID BM   fluticasone  2 spray Each Nare Daily   guaiFENesin  600 mg Oral BID   ipratropium-albuterol  3 mL Nebulization QID   [START ON 12/04/2018] levofloxacin  250 mg Oral Daily   montelukast  10 mg Per Tube QHS   multivitamin with minerals  1 tablet Oral Daily   pantoprazole  40 mg Oral QAC breakfast   predniSONE  20 mg Oral Q breakfast    rosuvastatin  10 mg Per Tube QODAY    Continuous Infusions:  sodium chloride 10 mL/hr at 12/03/18 1102   sodium chloride Stopped (11/30/18 1240)    PRN Meds: sodium chloride, albuterol, bisacodyl, docusate, ondansetron (ZOFRAN) IV, phenol  Physical Exam         Frail lady Resting in bed Shallow regular work of breathing Regular No edema Some muscle wasting Abdomen not distended  Vital Signs: BP (!) 126/49 (BP Location: Left Arm)    Pulse 73    Temp  98.1 F (36.7 C) (Oral)    Resp (!) 21    Ht 4\' 10"  (1.473 m)    Wt 54.4 kg    SpO2 100%    BMI 25.07 kg/m  SpO2: SpO2: 100 % O2 Device: O2 Device: Nasal Cannula O2 Flow Rate: O2 Flow Rate (L/min): 5 L/min  Intake/output summary:   Intake/Output Summary (Last 24 hours) at 12/03/2018 1504 Last data filed at 12/03/2018 0815 Gross per 24 hour  Intake 60 ml  Output 1700 ml  Net -1640 ml   LBM: Last BM Date: 12/02/18 Baseline Weight: Weight: 51.7 kg(10/16/2018) Most recent weight: Weight: 54.4 kg       Palliative Assessment/Data:    Flowsheet Rows     Most Recent Value  Intake Tab  Referral Department  Pulmonary  Unit at Time of Referral  Med/Surg Unit  Palliative Care Primary Diagnosis  Pulmonary  Palliative Care Type  New Palliative care  Reason for referral  Clarify Goals of Care  Date first seen by Palliative Care  12/01/18  Clinical Assessment  Palliative Performance Scale Score  30%  Pain Max last 24 hours  4  Pain Min Last 24 hours  3  Dyspnea Max Last 24 Hours  6  Dyspnea Min Last 24 hours  5  Nausea Max Last 24 Hours  4  Nausea Min Last 24 Hours  3  Psychosocial & Spiritual Assessment  Palliative Care Outcomes  Patient/Family meeting held?  Yes  Who was at the meeting?  patient in person, also discussed seperately with both son and daughter on the phone.      Patient Active Problem List   Diagnosis Date Noted   Encounter for palliative care    Goals of care, counseling/discussion    Shortness  of breath    Acute on chronic respiratory failure with hypoxia and hypercapnia ([place]) 11/30/2018   Sinusitis 12/12/2017   Ductal carcinoma in situ (DCIS) of right breast 03/10/2017   Coronary artery calcification 09/01/2016   Bilateral low back pain with sciatica 05/10/2016   Bronchopneumonia 05/10/2016   CAP (community acquired pneumonia) 03/31/2016   Chest pain 03/31/2016   Tachycardia 03/31/2016   PVC (premature ventricular contraction) 02/24/2016   Palpitations 01/19/2016   Respiratory failure ([place]) 06/11/2015   Acute on chronic respiratory failure ([place]) 06/11/2015   Knee pain, right 07/02/2014   Chronic respiratory failure with hypoxia ([place]) 07/02/2014   COPD, very severe ([place]) 07/02/2014   COPD exacerbation ([place]) 06/13/2013   Right rib fracture 05/15/2012   Fatigue 07/22/2011   Oral thrush 07/22/2011   Lung cancer, middle lobe ([place]) 04/26/2011   COPD (chronic obstructive pulmonary disease) ([place]) 01/13/2011    Palliative Care Assessment & Plan   Patient Profile:    Assessment: Severe COPD and emphysema Functional decline Shortness of breath Poor oral intake Generalized deconditioning Recent recurrent pneumonia  Recommendations/Plan:   continue current mode of care  Call placed and discussed with son Fanny Dance.  Discussed about the patient's current hospital course and her overall disease trajectory of illness.  Patient only able to tolerate few sips/bites of liquid diet.  Patient tires out easily.  Patient requiring 5 L of oxygen 24/7.    Appropriate disposition options discussed with the son in detail.  Discussed about residential hospice versus home with hospice.  Recommend residential hospice given patient's frailty, ongoing decline.  Patient's daughter who is her primary caregiver at home reportedly also has health concerns of her own.  Patient's son will discuss  further with his sister, the patient's daughter.  Palliative to follow-up  again in a.m.    Will add low-dose morphine sublingual solution to be used on an as-needed basis for shortness of breath  Agree with portable bedside fan.     Code Status: Code Status History    Date Active Date Inactive Code Status Order ID Comments User Context   11/24/2018 0738 11/29/2018 1425 Full Code [id number]  Chesley Mires, MD ED   04/11/2017 1328 04/12/2017 1309 Full Code [id number]  Rolm Bookbinder, MD Inpatient   03/31/2016 0925 04/06/2016 1814 Full Code [id number]  Waldemar Dickens, MD ED   03/31/2016 0925 03/31/2016 0925 Full Code [id number]  Waldemar Dickens, MD ED   06/11/2015 1001 06/18/2015 2022 Full Code [id number]  Erick Colace, NP ED   06/11/2015 1001 06/11/2015 1001 Full Code [id number]  Erick Colace, NP ED   Advance Care Planning Activity    Advance Directive Documentation     Most Recent Value  Type of Advance Directive  Healthcare Power of Attorney, Living will  Pre-existing out of facility DNR order (yellow form or pink MOST form)  --  "MOST" Form in Place?  --       Prognosis:   guarded ?few weeks.   Discharge Planning:  To Be Determined Residential hospice.  Care plan was discussed with  RN and son on the phone.   Thank you for allowing the Palliative Medicine Team to assist in the care of this patient.   Time In: 1300 Time Out: 1335 Total Time 35 Prolonged Time Billed  no       Greater than 50%  of this time was spent counseling and coordinating care related to the above assessment and plan.  Loistine Chance, MD [phone] Please contact Palliative Medicine Team phone at [phone] for questions and concerns.

## 2018-12-03 NOTE — Progress Notes (Signed)
NAME:  Heather Barnes, MRN:  [id number], DOB:  04-14-1934, LOS: 9 ADMISSION DATE:  11/24/2018, CONSULTATION DATE: 11/24/2018 REFERRING MD:  Dr. Tyrone Nine, ER, CHIEF COMPLAINT:  Short of breath   Brief History   33 yowf  MM quit smoking 2000  with COPD GOLD III spirometry  with very low dlco as of 02/2011  admit  with  exacerbation, acute on chronic hypercapnic/hypoxic respiratory failure from pneumonia after failed outpt therapy with prednisone.     History of present illness   85 yo female was seen in pulmonary office on 11/13/18 with shortness of breath, nasal congestion, headache and low grade temperature up to 73F.  She was treated with prednisone.  She developed fatigue, nausea and weakness.  Her breathing symptoms got worse and she presented to ER.  ABG showed hypoxia and hypercapnia.  Tx with steroids, magnesium, and nebs.  Failed to improve and intubated.  CXR showed pneumonia and started on ABX.  COVID test negative.  Past Medical History  Severe COPD with emphysema, chronic hypoxic/hypercapnic respiratory failure on 3 liters supplemental oxygen, Lung cancer s/p XRT, Breast cancer  Significant Hospital Events   6/19 Admit  6/25 transfer to floor, keep on pulm service  Consults:    Procedures:  ETT 6/19 , extubated 11/26/2018   Significant Diagnostic Tests:      Micro Data:  COVID 6/19 >> neg  MRSA  6/19 neg  Blood 6/19 >> neg x 2  Sputum 6/28 >>>      Antimicrobials:  Vancomycin 6/19 -6/20 Zosyn 6/19 -  D/c'd 6/24  Levaquin 6/28 >>>  Interim history/subjective:  Weak cough/congested with purulent sputum trace blood   Objective   Blood pressure (!) 121/55, pulse 67, temperature 98 F (36.7 C), temperature source Oral, resp. rate 20, height 4\' 10"  (1.473 m), weight 54.4 kg, SpO2 95 %.   On 5lpm NP          Intake/Output Summary (Last 24 hours) at 12/03/2018 0848 Last data filed at 12/03/2018 0526 Gross per 24 hour  Intake 120 ml  Output 1700 ml  Net -1580 ml    Filed Weights   12/01/18 0458 12/02/18 0437 12/03/18 0525  Weight: 54.8 kg 53.5 kg 54.4 kg   Examination: Chronically ill appearing more weak than sob appearing Pt alert, approp nad @ 30 degrees No jvd Oropharynx clear,  mucosa nl Neck supple Lungs with  Very distant bs/ exp > insp rhonchi bilaterally s localized wheeze RRR no s3 or or sign murmur Abd soft with  Limited excursion  Extr warm with no edema or clubbing noted Neuro  Sensorium intact ,  no apparent motor deficits      I personally reviewed images and agree with radiology impression as follows:  CXR:   12/01/2018 1. Worsening consolidation in the right upper lobe with persistent bilateral lower lobe consolidations suspect for multifocal pneumonia. 2. Emphysematous disease. Small bilateral pleural effusions. Right greater than left diffuse interstitial opacity suspicious for underlying interstitial edema.    Assessment & Plan:   Acute on chronic hypoxic/hypercapnic respiratory failure from pneumonia/ no better on serial cxr's sp zosyn x 5 days as above  COPD exacerbation Severe COPD cxr 6/26  no better with RUL post atelectasis and extensive residual changes in both lungs  6/26 Worse purulent sputum / trace hemoptysis so >>>  restart abx = Levaquin 500 mg x 10 days planned Continue  prednisone 20 mg daily for airways/ appetite/ energy Hospice referral done - pt/ot  feel the only way she can go home is with hosice/fm care 25/0     Metabolic encephalopathy Agitation/anxiety Resolved    Hypertension, hyperlipidemia added bisoprolol 6/26   - Adequate control on present rx  > no change in rx needed    Hemoptysis on lovenox d/c'd 6/26  -  Improved off lovenex but still some bloody mucus am 6/28 > rx bronchitis / pna with levaquin, hold anticoagulants    Plan for 6/28 :  Continue efforst to Mobilize / home with hospice ? 6/29    Christinia Gully, MD Pulmonary and [place] [phone] After 5:30 PM or weekends, use Beeper [phone]            Labs   CBC: Recent Labs  Lab 11/28/18 0226 11/29/18 1059 11/30/18 0235  WBC 38.7* 26.4* 23.6*  NEUTROABS 34.5* 23.2* 20.6*  HGB 10.7* 10.2* 9.7*  HCT 35.7* 34.0* 32.4*  MCV 90.2 91.6 92.3  PLT 340 317 532    Basic Metabolic Panel: Recent Labs  Lab 11/27/18 0158 11/28/18 0226 11/30/18 0235  NA  --  142 142  K  --  4.9 4.0  CL  --  100 98  CO2  --  28 38*  GLUCOSE  --  127* 118*  BUN  --  20 13  CREATININE 0.38* 0.36* 0.36*  CALCIUM  --  8.2* 8.4*   GFR: Estimated Creatinine Clearance: 38.9 mL/min (A) (by C-G formula based on SCr of 0.36 mg/dL (L)). Recent Labs  Lab 11/28/18 0226 11/29/18 1059 11/30/18 0235  WBC 38.7* 26.4* 23.6*    Liver Function Tests: No results for input(s): AST, ALT, ALKPHOS, BILITOT, PROT, ALBUMIN in the last 168 hours. No results for input(s): LIPASE, AMYLASE in the last 168 hours. No results for input(s): AMMONIA in the last 168 hours.  ABG    Component Value Date/Time   PHART 7.352 11/25/2018 0846   PCO2ART 66.0 (HH) 11/25/2018 0846   PO2ART 68.9 (L) 11/25/2018 0846   HCO3 35.7 (H) 11/25/2018 0846   TCO2 31 03/31/2016 0758   ACIDBASEDEF 5.2 (H) 11/14/2017 1040   O2SAT 92.7 11/25/2018 0846     Coagulation Profile: No results for input(s): INR, PROTIME in the last 168 hours.  Cardiac Enzymes: No results for input(s): CKTOTAL, CKMB, CKMBINDEX, TROPONINI in the last 168 hours.  HbA1C: Hgb A1c MFr Bld  Date/Time Value Ref Range Status  11/25/2018 06:30 AM 5.6 4.8 - 5.6 % Final    Comment:    (NOTE)         Prediabetes: 5.7 - 6.4         Diabetes: >6.4         Glycemic control for adults with diabetes: <7.0   06/11/2015 08:20 AM 5.8 (H) 4.8 - 5.6 % Final    Comment:    (NOTE)         Pre-diabetes: 5.7 - 6.4         Diabetes: >6.4         Glycemic control for adults with diabetes: <7.0     CBG: Recent Labs  Lab 12/01/18 2022  12/02/18 0002 12/02/18 0432 12/02/18 0806 12/02/18 1154  GLUCAP 94 121* 109* 104* 135*

## 2018-12-03 NOTE — Progress Notes (Signed)
PHARMACY NOTE:  ANTIMICROBIAL RENAL DOSAGE ADJUSTMENT  Current antimicrobial regimen includes a mismatch between antimicrobial dosage and estimated renal function.  As per policy approved by the Pharmacy & Therapeutics and Medical Executive Committees, the antimicrobial dosage will be adjusted accordingly.  Current antimicrobial dosage:  levaquin  Indication: COPD exacerbation; respiratory infection  Renal Function:  Estimated Creatinine Clearance: 38.9 mL/min (A) (by C-G formula based on SCr of 0.36 mg/dL (L)). []      On intermittent HD, scheduled: []      On CRRT    Antimicrobial dosage has been changed to:  levaquin 500 mg PO x1, then 250 mg daily   Thank you for allowing pharmacy to be a part of this patient's care.  Lynelle Doctor, [facility] 12/03/2018 8:50 AM

## 2018-12-04 LAB — EXPECTORATED SPUTUM ASSESSMENT W GRAM STAIN, RFLX TO RESP C

## 2018-12-04 NOTE — Progress Notes (Signed)
NAME:  Heather Barnes, MRN:  [id number], DOB:  06/06/1934, LOS: 86 ADMISSION DATE:  11/24/2018, CONSULTATION DATE: 11/24/2018 REFERRING MD:  Dr. Tyrone Nine, ER, CHIEF COMPLAINT:  Short of breath   Brief History   32 yowf  MM quit smoking 2000  with COPD GOLD III spirometry  with very low dlco as of 02/2011  admit  with  exacerbation, acute on chronic hypercapnic/hypoxic respiratory failure from pneumonia after failed outpt therapy with prednisone.   85 yo female was seen in pulmonary office on 11/13/18 with shortness of breath, nasal congestion, headache and low grade temperature up to 57F.  She was treated with prednisone.  She developed fatigue, nausea and weakness.  Her breathing symptoms got worse and she presented to ER.  ABG showed hypoxia and hypercapnia.  Tx with steroids, magnesium, and nebs.  Failed to improve and intubated.  CXR showed pneumonia and started on ABX.  COVID test negative.  Past Medical History  Severe COPD with emphysema, chronic hypoxic/hypercapnic respiratory failure on 3 liters supplemental oxygen, Lung cancer s/p XRT, Breast cancer  Significant Hospital Events   6/19 Admit  6/25 transfer to floor, keep on pulm service 6/28 - Weak cough/congested with purulent sputum trace blood   Consults:    Procedures:  ETT 6/19 , extubated 11/26/2018   Significant Diagnostic Tests:      Micro Data:  COVID 6/19 >> neg  MRSA  6/19 neg  Blood 6/19 >> neg x 2  Sputum 6/28 >>>      Antimicrobials:  Vancomycin 6/19 -6/20 Zosyn 6/19 -  D/c'd 6/24  Levaquin 6/28 >>>  Interim history/subjective:    6/29 - sat edge of bed for 7 minutes.buckled after standing for 45 seconds. Fatigues easily Pulse oxo 95% on 5L [place]. Per PT needs SNF or residential hispice.  -> To me: reports happy to see me after a while. Says this admission has been miserable. Says she is done fighting. Wants concomitant palliation. Wants to go home with hospice. Says stressed afer losing younger sister  earlier in the month. Does not want BiPAP  Objective   Blood pressure (!) 101/42, pulse 64, temperature 98 F (36.7 C), temperature source Oral, resp. rate 18, height 4\' 10"  (1.473 m), weight 55.1 kg, SpO2 100 %.   On 5lpm NP          Intake/Output Summary (Last 24 hours) at 12/04/2018 1525 Last data filed at 12/04/2018 0500 Gross per 24 hour  Intake 120 ml  Output 1900 ml  Net -1780 ml   Filed Weights   12/02/18 0437 12/03/18 0525 12/04/18 0456  Weight: 53.5 kg 54.4 kg 55.1 kg  General Appearance:  Sitting in chair. On 3L [place] - 97%, On 5L [place] = 99%. Looks very very deconditioned. The most frail I have ever seen her in years Head:  Normocephalic, without obvious abnormality, atraumatic Eyes:  PERRL - yes, conjunctiva/corneas - middu     Ears:  Normal external ear canals, both ears Nose:  G tube - no but has Johnson o2 Throat:  ETT TUBE - no , OG tube - no Neck:  Supple,  No enlargement/tenderness/nodules Lungs: Clear to auscultation bilaterally, Barrell chest. No wheez Heart:  S1 and S2 normal, no murmur, CVP - no.  Pressors - no Abdomen:  Soft, no masses, no organomegaly Genitalia / Rectal:  Not done Extremities:  Extremities- intac Skin:  ntact in exposed areas . Sacral area - not examined Neurologic:  Sedation - none -> RASS - +  1 . Moves all 4s - yes. CAM-ICU - neg . Orientation - x3+      LABS    PULMONARY No results for input(s): PHART, PCO2ART, PO2ART, HCO3, TCO2, O2SAT in the last 168 hours.  Invalid input(s): PCO2, PO2  CBC Recent Labs  Lab 11/28/18 0226 11/29/18 1059 11/30/18 0235  HGB 10.7* 10.2* 9.7*  HCT 35.7* 34.0* 32.4*  WBC 38.7* 26.4* 23.6*  PLT 340 317 229    COAGULATION No results for input(s): INR in the last 168 hours.  CARDIAC  No results for input(s): TROPONINI in the last 168 hours. No results for input(s): PROBNP in the last 168 hours.   CHEMISTRY Recent Labs  Lab 11/28/18 0226 11/30/18 0235  NA 142 142  K 4.9 4.0  CL 100 98  CO2  28 38*  GLUCOSE 127* 118*  BUN 20 13  CREATININE 0.36* 0.36*  CALCIUM 8.2* 8.4*   Estimated Creatinine Clearance: 39.2 mL/min (A) (by C-G formula based on SCr of 0.36 mg/dL (L)).   LIVER No results for input(s): AST, ALT, ALKPHOS, BILITOT, PROT, ALBUMIN, INR in the last 168 hours.   INFECTIOUS No results for input(s): LATICACIDVEN, PROCALCITON in the last 168 hours.   ENDOCRINE CBG (last 3)  Recent Labs    12/02/18 0432 12/02/18 0806 12/02/18 1154  GLUCAP 109* 104* 135*         IMAGING x48h  - image(s) personally visualized  -   highlighted in bold No results found.   Assessment & Plan:   Acute on chronic hypoxic/hypercapnic respiratory failure from pneumonia/ no better on serial cxr's sp zosyn x 5 days as above  COPD exacerbation Severe COPD cxr 6/26  no better with RUL post atelectasis and extensive residual changes in both lungs  6/26 Worse purulent sputum / trace hemoptysis so >>>  restart abx = Levaquin 500 mg x 10 days planned Continue  prednisone 20 mg daily for airways/ appetite/ energy  6/29 - improved but very deconditioned. Spiritually and emotinally exhausted. ES COPD +  PLAN Hospice referral done - pt/ot feel the only way she can go home is with hosice/fm care 24/7   Check mag, phos, bmet 75/30/05     Metabolic encephalopathy Agitation/anxiety Resolved    Hypertension, hyperlipidemia added bisoprolol 6/26   - Adequate control on present rx  > no change in rx needed    Hemoptysis on lovenox d/c'd 6/26  -  Improved off lovenex but still some bloody mucus am 6/28 > rx bronchitis / pna with levaquin, hold anticoagulants    Plan for 6/28 :  Continue efforst to Mobilize / home with hospice 6/30 or 7/1     SIGNATURE    Dr. Brand Males, M.D., F.C.C.P,  Pulmonary and Critical Care Medicine Staff Physician, [place] Director - Interstitial Lung Disease  Program  Pulmonary [place]  at [place], [place], [address]  Pager: [phone], If no answer or between  15:00h - 7:00h: call [phone] Telephone: [phone]  3:25 PM 12/04/2018

## 2018-12-04 NOTE — Progress Notes (Signed)
Daily Progress Note   Patient Name: Heather Barnes       Date: 12/04/2018 DOB: [birth year]  Age: 85 y.o. MRN#: [id number] Attending Physician: Brand Males, MD Primary Care Physician: [name], MD Admit Date: 11/24/2018  Reason for Consultation/Follow-up: Establishing goals of care 85 yo lady who lives at home with her daughter, has severe COPD with emphysema, lung cancer s/p XRT, history of breast cancer. She is oxygen dependent, is on 3L [place] pretty much on a 247 basis. Patient sees Dr Chase Caller in the outpatient setting for her pulmonary follow up.   Patient has been admitted with COPD exacerbation, recent recurrent pneumonia, worsening acute on chronic hypercapnic/hypoxic resp failure.   She even required intubation in this hospitalization, she is now extubated. SLP has been following, patient on liquid diet.   A palliative consult has been requested for ongoing goals of care discussions.   Subjective: Continues to appear weak, fatigued. PO intake remains poor.   Length of Stay: 10  Current Medications: Scheduled Meds:  . bisoprolol  5 mg Oral Daily  . feeding supplement (ENSURE ENLIVE)  237 mL Oral BID BM  . fluticasone  2 spray Each Nare Daily  . guaiFENesin  600 mg Oral BID  . ipratropium-albuterol  3 mL Nebulization QID  . levofloxacin  250 mg Oral Daily  . montelukast  10 mg Per Tube QHS  . multivitamin with minerals  1 tablet Oral Daily  . pantoprazole  40 mg Oral QAC breakfast  . predniSONE  20 mg Oral Q breakfast  . rosuvastatin  10 mg Per Tube QODAY    Continuous Infusions: . sodium chloride 10 mL/hr at 12/03/18 1102  . sodium chloride Stopped (11/30/18 1240)    PRN Meds: sodium chloride, albuterol, bisacodyl, docusate, morphine CONCENTRATE,  ondansetron (ZOFRAN) IV, phenol  Physical Exam         Frail lady Resting in bed Shallow regular work of breathing Regular No edema Some muscle wasting Abdomen not distended  Vital Signs: BP (!) 124/55 (BP Location: Left Arm)   Pulse 75   Temp 98 F (36.7 C) (Oral)   Resp (!) 21   Ht 4\' 10"  (1.473 m)   Wt 55.1 kg   SpO2 96%   BMI 25.39 kg/m  SpO2: SpO2: 96 % O2 Device: O2 Device: Nasal  Cannula O2 Flow Rate: O2 Flow Rate (L/min): 5 L/min  Intake/output summary:   Intake/Output Summary (Last 24 hours) at 12/04/2018 1021 Last data filed at 12/04/2018 0500 Gross per 24 hour  Intake 120 ml  Output 2400 ml  Net -2280 ml   LBM: Last BM Date: 12/02/18 Baseline Weight: Weight: 51.7 kg(10/16/2018) Most recent weight: Weight: 55.1 kg       Palliative Assessment/Data:    Flowsheet Rows     Most Recent Value  Intake Tab  Referral Department  Pulmonary  Unit at Time of Referral  Med/Surg Unit  Palliative Care Primary Diagnosis  Pulmonary  Palliative Care Type  New Palliative care  Reason for referral  Clarify Goals of Care  Date first seen by Palliative Care  12/01/18  Clinical Assessment  Palliative Performance Scale Score  30%  Pain Max last 24 hours  4  Pain Min Last 24 hours  3  Dyspnea Max Last 24 Hours  6  Dyspnea Min Last 24 hours  5  Nausea Max Last 24 Hours  4  Nausea Min Last 24 Hours  3  Psychosocial & Spiritual Assessment  Palliative Care Outcomes  Patient/Family meeting held?  Yes  Who was at the meeting?  patient in person, also discussed seperately with both son and daughter on the phone.      Patient Active Problem List   Diagnosis Date Noted  . SOB (shortness of breath)   . Encounter for palliative care   . Goals of care, counseling/discussion   . Shortness of breath   . Acute on chronic respiratory failure with hypoxia and hypercapnia ([place]) 11/30/2018  . Sinusitis 12/12/2017  . 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  Coronary artery calcification 09/01/2016  . Bilateral low back pain with sciatica 05/10/2016  . Bronchopneumonia 05/10/2016  . CAP (community acquired pneumonia) 03/31/2016  . Chest pain 03/31/2016  . Tachycardia 03/31/2016  . PVC (premature ventricular contraction) 02/24/2016  . Palpitations 01/19/2016  . Respiratory failure ([place]) 06/11/2015  . Acute on chronic respiratory failure ([place]) 06/11/2015  . Knee pain, right 07/02/2014  . Chronic respiratory failure with hypoxia ([place]) 07/02/2014  . COPD, very severe ([place]) 07/02/2014  . COPD exacerbation ([place]) 06/13/2013  . Right rib fracture 05/15/2012  . Fatigue 07/22/2011  . Oral thrush 07/22/2011  . Lung cancer, middle lobe ([place]) 04/26/2011  . COPD (chronic obstructive pulmonary disease) ([place]) 01/13/2011    Palliative Care Assessment & Plan   Patient Profile:    Assessment: Severe COPD and emphysema Functional decline Shortness of breath Poor oral intake Generalized deconditioning Recent recurrent pneumonia  Recommendations/Plan:   continue current mode of care  6/28: Call placed and discussed with son Fanny Dance.  Discussed about the patient's current hospital course and her overall disease trajectory of illness.  Patient only able to tolerate few sips/bites of liquid diet.  Patient tires out easily.  Patient requiring 5 L of oxygen 24/7.    Appropriate disposition options discussed with the son in detail.  Discussed about residential hospice versus home with hospice.  Recommend residential hospice given patient's frailty, ongoing decline.  Patient's daughter who is her primary caregiver at home reportedly also has health concerns of her own.  Patient's son will discuss further with his sister, the patient's daughter.  Palliative to follow-up again in a.m.    Will add low-dose morphine sublingual solution to be used on an as-needed basis for shortness of breath Agree with portable bedside  fan.   6-29: Will request CSW  consult to facilitate residential hospice arrangements.    Code Status: Code Status History    Date Active Date Inactive Code Status Order ID Comments User Context   11/24/2018 0738 11/29/2018 1425 Full Code [id number]  Chesley Mires, MD ED   04/11/2017 1328 04/12/2017 1309 Full Code [id number]  Rolm Bookbinder, MD Inpatient   03/31/2016 0925 04/06/2016 1814 Full Code [id number]  Waldemar Dickens, MD ED   03/31/2016 0925 03/31/2016 0925 Full Code [id number]  Waldemar Dickens, MD ED   06/11/2015 1001 06/18/2015 2022 Full Code [id number]  Erick Colace, NP ED   06/11/2015 1001 06/11/2015 1001 Full Code [id number]  Erick Colace, NP ED   Advance Care Planning Activity    Advance Directive Documentation     Most Recent Value  Type of Advance Directive  Healthcare Power of Attorney, Living will  Pre-existing out of facility DNR order (yellow form or pink MOST form)  -  "MOST" Form in Place?  -       Prognosis:   guarded   < 2 weeks.   Discharge Planning:   Residential hospice.  Care plan was discussed with  IDT  Thank you for allowing the Palliative Medicine Team to assist in the care of this patient.   Time In: 10 Time Out: 10.25 Total Time 25 Prolonged Time Billed  no       Greater than 50%  of this time was spent counseling and coordinating care related to the above assessment and plan.  Loistine Chance, MD [phone] Please contact Palliative Medicine Team phone at [phone] for questions and concerns.

## 2018-12-04 NOTE — Progress Notes (Signed)
Physical Therapy Treatment Patient Details Name: Heather Barnes MRN: [id number] DOB: 1934/01/08 Today's Date: 12/04/2018    History of Present Illness pt was admitted for respiratory failure/pna, COPD exacerbation, intubated 6/19-6/21.  PTW:SFKCLE COPD with emphysema, chronic hypoxic/hypercapnic respiratory failure on 3 liters supplemental oxygen, Lung cancer s/p XRT, Breast cancer    PT Comments    Pt sat edge of bed for ~7 minutes, SaO2 95% on 5L O2 [place]. Sit to stand x 3 trials for pericare. BLEs buckled after standing for ~45 seconds with RW. +2 mod assist for stand pivot transfer to recliner. Pt reported feeling she's able to breathe better sitting up in recliner. Pt puts forth good effort but does fatigue quickly.    Follow Up Recommendations  Supervision/Assistance - 24 hour;SNF (possibly residential hospice per palliative note)     Equipment Recommendations  3in1 (PT)    Recommendations for Other Services       Precautions / Restrictions Precautions Precautions: Other (comment) Precaution Comments: monitor O2 Restrictions Weight Bearing Restrictions: No    Mobility  Bed Mobility Overal bed mobility: Needs Assistance Bed Mobility: Supine to Sit     Supine to sit: Min assist     General bed mobility comments: pt moved legs off bed and partially rolled, using rail to sit up with assist for trunk.  Transfers Overall transfer level: Needs assistance Equipment used: Rolling walker (2 wheeled) Transfers: Sit to/from [facility] Sit to Stand: +2 safety/equipment;Mod assist;+2 physical assistance Stand pivot transfers: +2 physical assistance;Mod assist       General transfer comment: pt stood x 2 with RW for pericare, then SPT to recliner, +2 mod assist 2* buckling of BLEs  Ambulation/Gait                 Stairs             Wheelchair Mobility    Modified Rankin (Stroke Patients Only)       Balance Overall balance  assessment: Needs assistance Sitting-balance support: Feet supported Sitting balance-Leahy Scale: Fair Sitting balance - Comments: pt sat EOB for ~ 7 minutes   Standing balance support: Bilateral upper extremity supported Standing balance-Leahy Scale: Poor Standing balance comment: UE support needed for balance, LEs buckled after ~45 seconds of standing with RW                            Cognition Arousal/Alertness: Awake/alert Behavior During Therapy: WFL for tasks assessed/performed Overall Cognitive Status: Within Functional Limits for tasks assessed                                        Exercises      General Comments        Pertinent Vitals/Pain Faces Pain Scale: Hurts a little bit Pain Location: abdomen (appears distended) Pain Intervention(s): Monitored during session;Limited activity within patient's tolerance;Repositioned    Home Living                      Prior Function            PT Goals (current goals can now be found in the care plan section) Acute Rehab PT Goals Patient Stated Goal: regain strength PT Goal Formulation: With patient Time For Goal Achievement: 12/15/18 Potential to Achieve Goals: Fair Progress towards PT goals: Progressing toward goals  Frequency    Min 2X/week      PT Plan Current plan remains appropriate    Co-evaluation PT/OT/SLP Co-Evaluation/Treatment: Yes Reason for Co-Treatment: For patient/therapist safety;To address functional/ADL transfers PT goals addressed during session: Mobility/safety with mobility;Balance        AM-PAC PT "6 Clicks" Mobility   Outcome Measure  Help needed turning from your back to your side while in a flat bed without using bedrails?: A Little Help needed moving from lying on your back to sitting on the side of a flat bed without using bedrails?: A Lot Help needed moving to and from a bed to a chair (including a wheelchair)?: A Lot Help needed  standing up from a chair using your arms (e.g., wheelchair or bedside chair)?: A Lot Help needed to walk in hospital room?: Total Help needed climbing 3-5 steps with a railing? : Total 6 Click Score: 11    End of Session Equipment Utilized During Treatment: Oxygen Activity Tolerance: Patient limited by fatigue Patient left: with call bell/phone within reach;in chair;with chair alarm set Nurse Communication: Mobility status PT Visit Diagnosis: Other abnormalities of gait and mobility (R26.89);Muscle weakness (generalized) (M62.81)     Time: 1410-1430 PT Time Calculation (min) (ACUTE ONLY): 20 min  Charges:  $Therapeutic Activity: 8-22 mins                    Blondell Reveal Kistler PT 12/04/2018  Acute Rehabilitation Services Pager [phone] Office [phone]

## 2018-12-04 NOTE — Progress Notes (Signed)
Occupational Therapy Treatment Patient Details Name: Heather Barnes MRN: [id number] DOB: [birth year] Today's Date: 12/04/2018    History of present illness pt was admitted for respiratory failure/pna, COPD exacerbation, intubated 6/19-6/21.  VOH:YWVPXT COPD with emphysema, chronic hypoxic/hypercapnic respiratory failure on 3 liters supplemental oxygen, Lung cancer s/p XRT, Breast cancer   OT comments   pt fatigued quickly but did feel she could breath better sitting up!                       Follow Up Recommendations  SNF    Equipment Recommendations  3 in 1 bedside commode    Recommendations for Other Services      Precautions / Restrictions Precautions Precautions: Other (comment) Precaution Comments: monitor O2 Restrictions Weight Bearing Restrictions: No       Mobility Bed Mobility Overal bed mobility: Needs Assistance Bed Mobility: Supine to Sit     Supine to sit: Min assist     General bed mobility comments: pt moved legs off bed and partially rolled, using rail to sit up with assist for trunk.  Transfers Overall transfer level: Needs assistance Equipment used: Rolling walker (2 wheeled) Transfers: Sit to/from [facility] Sit to Stand: +2 safety/equipment;Mod assist;+2 physical assistance Stand pivot transfers: +2 physical assistance;Mod assist       General transfer comment: pt stood x 2 with RW for pericare, then SPT to recliner, +2 mod assist 2* buckling of BLEs    Balance Overall balance assessment: Needs assistance Sitting-balance support: Feet supported Sitting balance-Leahy Scale: Fair Sitting balance - Comments: pt sat EOB for ~ 7 minutes   Standing balance support: Bilateral upper extremity supported Standing balance-Leahy Scale: Poor Standing balance comment: UE support needed for balance, LEs buckled after ~45 seconds of standing with RW                           ADL either performed or assessed with clinical  judgement   ADL Overall ADL's : Needs assistance/impaired     Grooming: Brushing hair;Minimal assistance;Sitting Grooming Details (indicate cue type and reason): fatigued quickly                                               Cognition Arousal/Alertness: Awake/alert Behavior During Therapy: WFL for tasks assessed/performed Overall Cognitive Status: Within Functional Limits for tasks assessed                                                     Pertinent Vitals/ Pain       Faces Pain Scale: Hurts a little bit Pain Location: abdomen (appears distended) Pain Descriptors / Indicators: Dull Pain Intervention(s): Limited activity within patient's tolerance;Monitored during session         Frequency  Min 2X/week        Progress Toward Goals  OT Goals(current goals can now be found in the care plan section)  Progress towards OT goals: Progressing toward goals  Acute Rehab OT Goals Patient Stated Goal: regain strength OT Goal Formulation: With patient  Plan Discharge plan remains appropriate    Co-evaluation      Reason for Co-Treatment: For patient/therapist  safety;To address functional/ADL transfers PT goals addressed during session: Mobility/safety with mobility;Balance        AM-PAC OT "6 Clicks" Daily Activity     Outcome Measure   Help from another person eating meals?: A Little Help from another person taking care of personal grooming?: A Little Help from another person toileting, which includes using toliet, bedpan, or urinal?: A Lot Help from another person bathing (including washing, rinsing, drying)?: A Lot Help from another person to put on and taking off regular upper body clothing?: A Little Help from another person to put on and taking off regular lower body clothing?: Total 6 Click Score: 14    End of Session Equipment Utilized During Treatment: Rolling walker  OT Visit Diagnosis: Muscle weakness  (generalized) (M62.81);Unsteadiness on feet (R26.81)   Activity Tolerance Patient limited by fatigue   Patient Left with call bell/phone within reach;in chair;with chair alarm set   Nurse Communication Mobility status        Time: 1400-1430 OT Time Calculation (min): 30 min  Charges: OT General Charges $OT Visit: 1 Visit OT Treatments $Self Care/Home Management : 8-22 mins  Kari Baars, [place] [phone] Office- [place], [name] D 12/04/2018, 3:53 PM

## 2018-12-04 NOTE — Care Management Important Message (Signed)
Important Message  Patient Details IM Letter given to Servando Snare SW to present to the Patient Name: Heather Barnes MRN: [id number] Date of Birth: 1934/05/25   Medicare Important Message Given:  Yes     Kerin Salen 12/04/2018, 11:52 AM

## 2018-12-04 NOTE — TOC Initial Note (Signed)
Transition of Care [facility]) - Initial/Assessment Note    Patient Details  Name: Heather Barnes MRN: [id number] Date of Birth: 1934-04-29  Transition of Care [facility]) CM/SW Contact:    Servando Snare, LCSW Phone Number: 12/04/2018, 4:13 PM  Clinical Narrative:   LCSW spoke with patients son, [name]. Patient and family are agreeable to [facility] place for hospice wanted clarification about his moms condition. According to son patient called him and discussed how well she worked with therapy today. Patients son feels that hospice remains the best option for his mom and would like to proceed with the referral. Patient is from home with her daughter who has health issues and is not able to care for pt. [name] states that his sister has some questions for the palliative doctor. LCSW notified palliative MD via secure chat.                   Expected Discharge Plan: [place] Barriers to Discharge: No Barriers Identified   Patient Goals and CMS Choice     Choice offered to / list presented to : Adult Children  Expected Discharge Plan and Services Expected Discharge Plan: [place] In-house Referral: Hospice / Palliative Care Discharge Planning Services: NA Post Acute Care Choice: Hospice Living arrangements for the past 2 months: Single Family Home Expected Discharge Date: (unknown)               DME Arranged: N/A DME Agency: NA       HH Arranged: NA HH Agency: NA        Prior Living Arrangements/Services Living arrangements for the past 2 months: Single Family Home Lives with:: Adult Children Patient language and need for interpreter reviewed:: Yes Do you feel safe going back to the place where you live?: Yes      Need for Family Participation in Patient Care: Yes (Comment) Care giver support system in place?: Yes (comment)   Criminal Activity/Legal Involvement Pertinent to Current Situation/Hospitalization: No - Comment as needed  Activities of  Daily Living Home Assistive Devices/Equipment: Eyeglasses, Environmental consultant (specify type), Wheelchair, Oxygen ADL Screening (condition at time of admission) Patient's cognitive ability adequate to safely complete daily activities?: No(atient currently intubated) Is the patient deaf or have difficulty hearing?: No Does the patient have difficulty seeing, even when wearing glasses/contacts?: No Does the patient have difficulty concentrating, remembering, or making decisions?: Yes Patient able to express need for assistance with ADLs?: No Does the patient have difficulty dressing or bathing?: Yes Independently performs ADLs?: No Communication: Dependent Is this a change from baseline?: Change from baseline, expected to last >3 days Dressing (OT): Dependent Is this a change from baseline?: Change from baseline, expected to last >3 days Grooming: Dependent Is this a change from baseline?: Change from baseline, expected to last >3 days Feeding: Dependent Is this a change from baseline?: Change from baseline, expected to last >3 days Bathing: Dependent Is this a change from baseline?: Change from baseline, expected to last >3 days Toileting: Dependent Is this a change from baseline?: Change from baseline, expected to last >3days In/Out Bed: Dependent Is this a change from baseline?: Change from baseline, expected to last >3 days Walks in Home: Dependent Is this a change from baseline?: Change from baseline, expected to last >3 days Does the patient have difficulty walking or climbing stairs?: Yes Weakness of Legs: Both Weakness of Arms/Hands: Both  Permission Sought/Granted Permission sought to share information with : Family Supports Permission granted to share information  with : Yes, Verbal Permission Granted  Share Information with NAME: [name] and [name]  Permission granted to share info w AGENCY: Hospice  Permission granted to share info w Relationship: Children     Emotional  Assessment Appearance:: Appears stated age Attitude/Demeanor/Rapport: Engaged Affect (typically observed): Calm Orientation: : Oriented to Self Alcohol / Substance Use: Not Applicable Psych Involvement: No (comment)  Admission diagnosis:  Respiratory failure ([place]) [J96.90] Acute on chronic respiratory failure with hypoxia and hypercapnia (HCC) [J96.21, J96.22] Multifocal pneumonia [J18.9] Patient Active Problem List   Diagnosis Date Noted  . SOB (shortness of breath)   . Encounter for palliative care   . Goals of care, counseling/discussion   . Shortness of breath   . Acute on chronic respiratory failure with hypoxia and hypercapnia ([place]) 11/30/2018  . Sinusitis 12/12/2017  . 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 Coronary artery calcification 09/01/2016  . Bilateral low back pain with sciatica 05/10/2016  . Bronchopneumonia 05/10/2016  . CAP (community acquired pneumonia) 03/31/2016  . Chest pain 03/31/2016  . Tachycardia 03/31/2016  . PVC (premature ventricular contraction) 02/24/2016  . Palpitations 01/19/2016  . Respiratory failure ([place]) 06/11/2015  . Acute on chronic respiratory failure ([place]) 06/11/2015  . Knee pain, right 07/02/2014  . Chronic respiratory failure with hypoxia ([place]) 07/02/2014  . COPD, very severe ([place]) 07/02/2014  . COPD exacerbation ([place]) 06/13/2013  . Right rib fracture 05/15/2012  . Fatigue 07/22/2011  . Oral thrush 07/22/2011  . Lung cancer, middle lobe ([place]) 04/26/2011  . COPD (chronic obstructive pulmonary disease) ([place]) 01/13/2011   PCP:  [name], MD Pharmacy:   [facility], [address] [place] [address] Phone: [phone] Fax: [phone]     Social Determinants of Health (SDOH) Interventions    Readmission Risk Interventions Readmission Risk Prevention Plan 12/04/2018 11/27/2018  Transportation Screening Complete Complete  PCP or  Specialist Appt within 3-5 Days - Not Complete  Not Complete comments - not yet ready for d/c  Home Care Screening Complete -  [place] or [place] - Complete  Social Work Consult for [place] Planning/Counseling - Complete  Palliative Care Screening - Not Applicable  Medication Review Press photographer) - Complete  Some recent data might be hidden

## 2018-12-05 DIAGNOSIS — Z7189 Other specified counseling: Secondary | ICD-10-CM

## 2018-12-05 DIAGNOSIS — Z515 Encounter for palliative care: Secondary | ICD-10-CM

## 2018-12-05 LAB — BASIC METABOLIC PANEL
Anion gap: 10 (ref 5–15)
BUN: 11 mg/dL (ref 8–23)
CO2: 32 mmol/L (ref 22–32)
Calcium: 8.7 mg/dL — ABNORMAL LOW (ref 8.9–10.3)
Chloride: 98 mmol/L (ref 98–111)
Creatinine, Ser: 0.31 mg/dL — ABNORMAL LOW (ref 0.44–1.00)
GFR calc Af Amer: >60 mL/min (ref >60)
GFR calc non Af Amer: >60 mL/min (ref >60)
Glucose, Bld: 107 mg/dL — ABNORMAL HIGH (ref 70–99)
Potassium: 3.9 mmol/L (ref 3.5–5.1)
Sodium: 140 mmol/L (ref 135–145)

## 2018-12-05 LAB — CBC WITH DIFFERENTIAL/PLATELET
Abs Immature Granulocytes: 0.29 10*3/uL — ABNORMAL HIGH (ref 0.00–0.07)
Basophils Absolute: 0 10*3/uL (ref 0.0–0.1)
Basophils Relative: 0 %
Eosinophils Absolute: 0.2 10*3/uL (ref 0.0–0.5)
Eosinophils Relative: 1 %
HCT: 34.6 % — ABNORMAL LOW (ref 36.0–46.0)
Hemoglobin: 10 g/dL — ABNORMAL LOW (ref 12.0–15.0)
Immature Granulocytes: 2 %
Lymphocytes Relative: 6 %
Lymphs Abs: 1 10*3/uL (ref 0.7–4.0)
MCH: 26.4 pg (ref 26.0–34.0)
MCHC: 28.9 g/dL — ABNORMAL LOW (ref 30.0–36.0)
MCV: 91.3 fL (ref 80.0–100.0)
Monocytes Absolute: 1.2 10*3/uL — ABNORMAL HIGH (ref 0.1–1.0)
Monocytes Relative: 7 %
Neutro Abs: 13.4 10*3/uL — ABNORMAL HIGH (ref 1.7–7.7)
Neutrophils Relative %: 84 %
Platelets: 250 10*3/uL (ref 150–400)
RBC: 3.79 MIL/uL — ABNORMAL LOW (ref 3.87–5.11)
RDW: 14.6 % (ref 11.5–15.5)
WBC: 16.1 10*3/uL — ABNORMAL HIGH (ref 4.0–10.5)
nRBC: 0 % (ref 0.0–0.2)

## 2018-12-05 LAB — PHOSPHORUS: Phosphorus: 3.4 mg/dL (ref 2.5–4.6)

## 2018-12-05 LAB — MAGNESIUM: Magnesium: 2.2 mg/dL (ref 1.7–2.4)

## 2018-12-05 MED ORDER — ENSURE ENLIVE PO LIQD: 237.0000 mL | Freq: Two times a day (BID) | ORAL | 12 refills | Status: AC

## 2018-12-05 MED ORDER — IPRATROPIUM-ALBUTEROL 0.5-2.5 (3) MG/3ML IN SOLN: 3.0000 mL | Freq: Four times a day (QID) | RESPIRATORY_TRACT | 0 refills | Status: DC

## 2018-12-05 MED ORDER — MORPHINE SULFATE (CONCENTRATE) 10 MG/0.5ML PO SOLN: 2.5000 mg | ORAL | 0 refills | Status: DC | PRN

## 2018-12-05 MED ORDER — GUAIFENESIN ER 600 MG PO TB12: 600.0000 mg | ORAL_TABLET | Freq: Two times a day (BID) | ORAL | Status: DC

## 2018-12-05 MED ORDER — IPRATROPIUM-ALBUTEROL 0.5-2.5 (3) MG/3ML IN SOLN: 3.0000 mL | Freq: Two times a day (BID) | RESPIRATORY_TRACT | Status: DC

## 2018-12-05 MED ORDER — BISACODYL 10 MG RE SUPP: 10.0000 mg | Freq: Every day | RECTAL | 0 refills | Status: DC | PRN

## 2018-12-05 MED ORDER — PHENOL 1.4 % MT LIQD: 1.0000 | OROMUCOSAL | 0 refills | Status: DC | PRN

## 2018-12-05 MED ORDER — LEVOFLOXACIN 250 MG PO TABS: 250.0000 mg | ORAL_TABLET | Freq: Every day | ORAL | 0 refills | Status: DC

## 2018-12-05 MED ORDER — PREDNISONE 20 MG PO TABS: 20.0000 mg | ORAL_TABLET | Freq: Every day | ORAL | 0 refills | Status: DC

## 2018-12-05 NOTE — Progress Notes (Signed)
Daily Progress Note   Patient Name: Heather Barnes       Date: 12/05/2018 DOB: 1934/01/26  Age: 85 y.o. MRN#: [id number] Attending Physician: Heather Males, MD Primary Care Physician: Heather Caraway, MD Admit Date: 11/24/2018  Reason for Consultation/Follow-up: Establishing goals of care 85 yo female with severe COPD, lung CA s/p XRT, breast cancer.  Discussed with bedside RN as well as Dr. Chase Barnes.  Patient has been admitted with COPD exacerbation, recent recurrent pneumonia, worsening acute on chronic hypercapnic/hypoxic resp failure.  Intubated earlier this admission.   Subjective: I saw and examined Heather Barnes.  She is lying in bed with mild increased WOB.    She reports, "Oh so tired, Just tired" but denies other specific complaints.  States that she spoke with her son this AM and plan is to transition to residential hospice today.  Length of Stay: 11  Current Medications: Scheduled Meds:  . bisoprolol  5 mg Oral Daily  . feeding supplement (ENSURE ENLIVE)  237 mL Oral BID BM  . fluticasone  2 spray Each Nare Daily  . guaiFENesin  600 mg Oral BID  . ipratropium-albuterol  3 mL Nebulization QID  . levofloxacin  250 mg Oral Daily  . montelukast  10 mg Per Tube QHS  . multivitamin with minerals  1 tablet Oral Daily  . pantoprazole  40 mg Oral QAC breakfast  . predniSONE  20 mg Oral Q breakfast  . rosuvastatin  10 mg Per Tube QODAY    Continuous Infusions: . sodium chloride 10 mL/hr at 12/03/18 1102  . sodium chloride Stopped (11/30/18 1240)    PRN Meds: sodium chloride, albuterol, bisacodyl, docusate, morphine CONCENTRATE, ondansetron (ZOFRAN) IV, phenol  Physical Exam         Frail lady Resting in bed Shallow regular work of breathing Regular No edema Some  muscle wasting Abdomen not distended  Vital Signs: BP (!) 107/33 (BP Location: Left Arm)   Pulse 75   Temp 97.7 F (36.5 C) (Oral)   Resp 18   Ht 4\' 10"  (1.473 m)   Wt 53.7 kg Comment: 118.4  SpO2 96%   BMI 24.75 kg/m  SpO2: SpO2: 96 % O2 Device: O2 Device: Nasal Cannula O2 Flow Rate: O2 Flow Rate (L/min): 5 L/min  Intake/output summary:   Intake/Output Summary (Last 24  hours) at 12/05/2018 4034 Last data filed at 12/05/2018 7425 Gross per 24 hour  Intake 1498.32 ml  Output 2300 ml  Net -801.68 ml   LBM: Last BM Date: 12/02/18 Baseline Weight: Weight: 51.7 kg(10/16/2018) Most recent weight: Weight: 53.7 kg(118.4)       Palliative Assessment/Data:    Flowsheet Rows     Most Recent Value  Intake Tab  Referral Department  Pulmonary  Unit at Time of Referral  Med/Surg Unit  Palliative Care Primary Diagnosis  Pulmonary  Palliative Care Type  New Palliative care  Reason for referral  Clarify Goals of Care  Date first seen by Palliative Care  12/01/18  Clinical Assessment  Palliative Performance Scale Score  30%  Pain Max last 24 hours  4  Pain Min Last 24 hours  3  Dyspnea Max Last 24 Hours  6  Dyspnea Min Last 24 hours  5  Nausea Max Last 24 Hours  4  Nausea Min Last 24 Hours  3  Psychosocial & Spiritual Assessment  Palliative Care Outcomes  Patient/Family meeting held?  Yes  Who was at the meeting?  patient in person, also discussed seperately with both son and daughter on the phone.      Patient Active Problem List   Diagnosis Date Noted  . SOB (shortness of breath)   . Encounter for palliative care   . Goals of care, counseling/discussion   . Shortness of breath   . Acute on chronic respiratory failure with hypoxia and hypercapnia ([place]) 11/30/2018  . Sinusitis 12/12/2017  . 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 Coronary artery calcification 09/01/2016  . Bilateral low back pain with sciatica 05/10/2016  . Bronchopneumonia  05/10/2016  . CAP (community acquired pneumonia) 03/31/2016  . Chest pain 03/31/2016  . Tachycardia 03/31/2016  . PVC (premature ventricular contraction) 02/24/2016  . Palpitations 01/19/2016  . Respiratory failure ([place]) 06/11/2015  . Acute on chronic respiratory failure ([place]) 06/11/2015  . Knee pain, right 07/02/2014  . Chronic respiratory failure with hypoxia ([place]) 07/02/2014  . COPD, very severe ([place]) 07/02/2014  . COPD exacerbation ([place]) 06/13/2013  . Right rib fracture 05/15/2012  . Fatigue 07/22/2011  . Oral thrush 07/22/2011  . Lung cancer, middle lobe ([place]) 04/26/2011  . COPD (chronic obstructive pulmonary disease) ([place]) 01/13/2011    Palliative Care Assessment & Plan   Patient Profile:    Assessment: Severe COPD and emphysema Functional decline Shortness of breath Poor oral intake Generalized deconditioning Recent recurrent pneumonia  Recommendations/Plan:  Plan for transition to residential hospice today.  Discussed with patient and Dr. Chase Barnes.   Code Status:  DNR   Prognosis: Weeks   Discharge Planning: Residential hospice.   Thank you for allowing the Palliative Medicine Team to assist in the care of this patient.   Time In: 0950 Time Out: 1005 Total Time 15 Prolonged Time Billed  no       Greater than 50%  of this time was spent counseling and coordinating care related to the above assessment and plan.  Micheline Rough, MD [place] Team [phone]  Please contact Palliative Medicine Team phone at [phone] for questions and concerns.

## 2018-12-05 NOTE — Discharge Summary (Signed)
Physician Discharge Summary  Patient ID: Heather Barnes MRN: [id number] DOB/AGE: 83-Jun-1935 83 y.o.  Admit date: 11/24/2018 Discharge date: 12/05/2018    Discharge Diagnoses:  Acute on Chronic Hypoxic / Hypercarbic Respiratory Failure  Pneumonia  COPD with Acute Exacerbation  Severe COPD  Anxiety  Acute Metabolic Encephalopathy  HTN  HLD  Hemoptysis                                                                      DISCHARGE PLAN BY DIAGNOSIS      Acute on Chronic Hypoxic / Hypercarbic Respiratory Failure  Pneumonia  COPD with Acute Exacerbation  Severe COPD  Anxiety  Acute Metabolic Encephalopathy  HTN  HLD  Hemoptysis - in setting of PNA, AECOPD on lovenox, resolved with cessation of lovenox   Discharge Plan: Discharge to [facility] for Palliative / [place]  Continue morphine for dyspnea control  Prednisone 20 mg QD  Complete levaquin as prescribed Pulmonary hygiene  Wean O2 for saturations of 88-95%, currently requiring 5L                    DISCHARGE SUMMARY   85 y/o F, former, smoker, with GOLD III COPD with very low DLCO (02/2011) admitted 6/19 with acute on chronic hypoxic / hypercapnic respiratory failure in the setting of pneumonia.  The patient had been treated as an outpatient for pneumonia but worsened and was admitted on 6/19.  Initially, she presented with fatigue, nausea, weakness and SOB.  ABG was consistent with hypercapnia & hypoxia.  She was treated with IV steroids, magnesium and nebulized bronchodilators in the ER without improvement and was intubated. CXR was concerning for pneumonia.  COVID testing negative.  The patient was supported in ICU on mechanical ventilation until 6/21 at which time she was successfully extubated.  Antibiotics were narrowed.  Cultures negative.  The patient was transferred to the medical floor.  She was seen by Palliative Care and ultimately elected to transition to palliative care.   Past Medical History   Severe COPD with emphysema, chronic hypoxic/hypercapnic respiratory failure on 3 liters supplemental oxygen, Lung cancer s/p XRT, Breast cancer  Significant Hospital Events   6/19 Admit  6/25 transfer to floor, keep on pulm service 6/28 Weak cough/congested with purulent sputum trace blood   Consults:  Palliative Care  Procedures:  ETT 6/19 >> 6/21   Significant Diagnostic Tests:  CXR 6/19 >> multi-focal bilateral alveolar infiltrates  Micro Data:  COVID 6/19 >> neg  MRSA  6/19 >>neg  Blood 6/19 >> neg x 2  Sputum 6/28 >> negative   Antimicrobials:  Vancomycin 6/19 >> 6/20 Zosyn 6/19 >> 6/24  Levaquin 6/28 >> 7/5   Discharge Exam: General: elderly female lying in bed in NAD Neuro: AAOx4, speech clear, MAE  CV: s1s2 regular PULM: prolonged expiratory phase, moist cough, occasional rhonchi  GI: soft/non-tender, bsx4 active  Extremities: warm/dry, no edema   Vitals:   12/05/18 0530 12/05/18 0732 12/05/18 0900 12/05/18 1338  BP: (!) 107/33   (!) 126/55  Pulse: 75   79  Resp: 18   16  Temp: 97.7 F (36.5 C)   97.8 F (36.6 C)  TempSrc: Oral   Oral  SpO2: 98% 96%  96%  Weight:   53.7 kg   Height:         Discharge Labs  BMET Recent Labs  Lab 11/30/18 0235 12/05/18 0550  NA 142 140  K 4.0 3.9  CL 98 98  CO2 38* 32  GLUCOSE 118* 107*  BUN 13 11  CREATININE 0.36* 0.31*  CALCIUM 8.4* 8.7*  MG  --  2.2  PHOS  --  3.4    CBC Recent Labs  Lab 11/29/18 1059 11/30/18 0235 12/05/18 0550  HGB 10.2* 9.7* 10.0*  HCT 34.0* 32.4* 34.6*  WBC 26.4* 23.6* 16.1*  PLT 317 229 250    Anti-Coagulation No results for input(s): INR in the last 168 hours.  Discharge Instructions    Call MD for:  difficulty breathing, headache or visual disturbances   Complete by: As directed    Call MD for:  extreme fatigue   Complete by: As directed    Call MD for:  hives   Complete by: As directed    Call MD for:  persistant dizziness or light-headedness    Complete by: As directed    Call MD for:  persistant nausea and vomiting   Complete by: As directed    Call MD for:  redness, tenderness, or signs of infection (pain, swelling, redness, odor or green/yellow discharge around incision site)   Complete by: As directed    Call MD for:  severe uncontrolled pain   Complete by: As directed    Call MD for:  temperature >100.4   Complete by: As directed    Discharge instructions   Complete by: As directed    1.  Review discharge medications carefully as they may have changed.  2.  [place] will adjust medications for shortness of breath as needed  3.  Follow up with [place] at discharge  4.  Call MD if new or worsening concerns.   Increase activity slowly   Complete by: As directed        Allergies as of 12/05/2018      Reactions   Tiotropium Bromide Monohydrate Hives, Shortness Of Breath, Rash   Spiriva    Codeine Other (See Comments)   Hyperactivity,crying   Fosamax [alendronate] Other (See Comments)   Muscle aches   Asa [aspirin] Other (See Comments)   Bleeding -ulcers   Latex Rash      Medication List    STOP taking these medications   rosuvastatin 10 MG tablet Commonly known as: CRESTOR   Singulair 10 MG tablet Generic drug: montelukast   Trelegy Ellipta 100-62.5-25 MCG/INH Aepb Generic drug: Fluticasone-Umeclidin-Vilant     TAKE these medications   acetaminophen 325 MG tablet Commonly known as: TYLENOL Take 650 mg by mouth every 4 (four) hours as needed for mild pain or headache.   albuterol 108 (90 Base) MCG/ACT inhaler Commonly known as: VENTOLIN HFA Inhale 2 puffs into the lungs every 6 (six) hours as needed for wheezing or shortness of breath.   azelastine 0.1 % nasal spray Commonly known as: ASTELIN Place 2 sprays into both nostrils 2 (two) times daily. Use in each nostril as directed   bisacodyl 10 MG suppository Commonly known as: DULCOLAX Place 1 suppository (10 mg total) rectally daily as  needed for moderate constipation.   Calcium Carbonate-Vitamin D 600-200 MG-UNIT Tabs Take 1 tablet by mouth daily.   cetirizine 10 MG tablet Commonly known as: ZYRTEC Take 10 mg by mouth daily.   feeding supplement (ENSURE ENLIVE) Liqd Take  237 mLs by mouth 2 (two) times daily between meals.   Fish Oil 1000 MG Caps Take 1,000 mg by mouth at bedtime.   fluticasone 50 MCG/ACT nasal spray Commonly known as: FLONASE Place 2 sprays into the nose daily as needed for allergies or rhinitis.   guaiFENesin 600 MG 12 hr tablet Commonly known as: MUCINEX Take 1 tablet (600 mg total) by mouth 2 (two) times daily.   ipratropium-albuterol 0.5-2.5 (3) MG/3ML Soln Commonly known as: DUONEB Take 3 mLs by nebulization 4 (four) times daily.   levofloxacin 250 MG tablet Commonly known as: LEVAQUIN Take 1 tablet (250 mg total) by mouth daily. Start taking on: December 06, 2018   metoprolol tartrate 25 MG tablet Commonly known as: LOPRESSOR TAKE 1/2 (ONE-HALF) TABLET BY MOUTH TWICE DAILY What changed: See the new instructions.   morphine CONCENTRATE 10 MG/0.5ML Soln concentrated solution Take 0.13 mLs (2.6 mg total) by mouth every 4 (four) hours as needed for severe pain or shortness of breath.   OXYGEN Inhale into the lungs. 3L at rest and 4pulse with exertion   phenol 1.4 % Liqd Commonly known as: CHLORASEPTIC Use as directed 1 spray in the mouth or throat as needed for throat irritation / pain.   predniSONE 20 MG tablet Commonly known as: DELTASONE Take 1 tablet (20 mg total) by mouth daily with breakfast. Start taking on: December 06, 2018   PreserVision AREDS 2 Caps Take 1 tablet by mouth 2 (two) times a day.   Vitamin D 50 MCG (2000 UT) Caps Take 2,000 Units by mouth daily.        Disposition:  [facility]  Discharged Condition: Heather Barnes has met maximum benefit of inpatient care and is medically stable and cleared for discharge.  Patient is pending follow up as above.       Time spent on disposition:  35 Minutes.   Signed: Noe Gens, NP-C [facility] Pgr: [phone] Office: [phone]

## 2018-12-05 NOTE — TOC Progression Note (Addendum)
Transition of Care [facility]) - Progression Note    Patient Details  Name: Heather Barnes MRN: [id number] Date of Birth: 1934/04/19  Transition of Care [facility]) CM/SW Contact  Servando Snare, LCSW Phone Number: 12/05/2018, 9:02 AM  Clinical Narrative:  Patient accepted to [facility] place. Son wants to discuss with patient again before making a decision to transfer. LCSW will continue to follow.    9:38 AM Patient and family agreeable to [facility]. Patients son is meeting at noon to complete paperwork. LCSW notified attending by secure chat.    Expected Discharge Plan: [place] Barriers to Discharge: No Barriers Identified  Expected Discharge Plan and Services Expected Discharge Plan: [place] In-house Referral: Hospice / Palliative Care Discharge Planning Services: NA Post Acute Care Choice: Hospice Living arrangements for the past 2 months: Single Family Home Expected Discharge Date: (unknown)               DME Arranged: N/A DME Agency: NA       HH Arranged: NA HH Agency: NA         Social Determinants of Health (SDOH) Interventions    Readmission Risk Interventions Readmission Risk Prevention Plan 12/04/2018 11/27/2018  Transportation Screening Complete Complete  PCP or Specialist Appt within 3-5 Days - Not Complete  Not Complete comments - not yet ready for d/c  Home Care Screening Complete -  [place] or [place] - Complete  Social Work Consult for [place] Planning/Counseling - Complete  Palliative Care Screening - Not Applicable  Medication Review Press photographer) - Complete  Some recent data might be hidden

## 2018-12-05 NOTE — Progress Notes (Signed)
[employer] available today. Son [name] has completed paper work.   Please send discharge summary to [phone].  RN please call report to [phone] prior to patient leaving the unit.   Thank you,  Erling Conte, LCSW [phone]

## 2018-12-05 NOTE — TOC Transition Note (Signed)
Transition of Care [facility]) - CM/SW Discharge Note   Patient Details  Name: Heather Barnes MRN: [id number] Date of Birth: May 29, 1934  Transition of Care [facility]) CM/SW Contact:  Servando Snare, LCSW Phone Number: 12/05/2018, 10:39 AM   Clinical Narrative:   Patient to dc to [facility]. LCSW faxed dc documents to facility. Son notified of transfer.   RN please call report to [phone] prior to patient leaving the unit.  Final next level of care: [place] Barriers to Discharge: No Barriers Identified   Patient Goals and CMS Choice Patient states their goals for this hospitalization and ongoing recovery are:: going to hospice   Choice offered to / list presented to : NA  Discharge Placement              Patient chooses bed at: [facility]) Patient to be transferred to facility by: EMS Name of family member notified: [name] Patient and family notified of of transfer: 12/05/18  Discharge Plan and Services In-house Referral: Hospice / Palliative Care Discharge Planning Services: NA Post Acute Care Choice: Hospice          DME Arranged: N/A DME Agency: NA       HH Arranged: NA HH Agency: NA        Social Determinants of Health (SDOH) Interventions     Readmission Risk Interventions Readmission Risk Prevention Plan 12/04/2018 11/27/2018  Transportation Screening Complete Complete  PCP or Specialist Appt within 3-5 Days - Not Complete  Not Complete comments - not yet ready for d/c  Home Care Screening Complete -  [place] or [place] - Complete  Social Work Consult for [place] Planning/Counseling - Complete  Palliative Care Screening - Not Applicable  Medication Review Press photographer) - Complete  Some recent data might be hidden

## 2019-01-29 ENCOUNTER — Telehealth: Payer: Self-pay | Admitting: Internal Medicine

## 2019-01-29 DIAGNOSIS — R6889 Other general symptoms and signs: Secondary | ICD-10-CM | POA: Diagnosis not present

## 2019-01-29 NOTE — Telephone Encounter (Signed)
Im fine ordering COVID testing if hospice RN can draw? Is that all that needs to be done/clarified

## 2019-01-29 NOTE — Telephone Encounter (Signed)
Message routed to app of the day, Heather Quaker, Heather Barnes for recommendations.  Heather Barnes, please see below. Can we give order to [facility] if they have not been able to obtain order for COVID test to be done at home for patient? Thanks.  Primary Pulmonologist: Heather Barnes Last office visit and with whom: 11/13/18 Heather Barnes What do we see them for (pulmonary problems): COPD  Reason for call: Possible COVID exposure  In the last month, have you been in contact with someone who was confirmed or suspected to have [place]avirus / COVID-19?  Yes 01/16/19  Do you have any of the following symptoms developed in the last 30 days? Fever: none per son Cough: no, but has been having  Shortness of breath: none  When did your symptoms start?  No symptoms at this time. Patient is in hospice care and recovering from pneumonia.  If the patient has a fever, what is the last reading?  (use n/a if patient denies fever)  N/A  Patient son Heather Barnes stated the patient is currently at home receiving hospice care from [facility]. He stated they have been in contact with Heather Barnes [phone]) and they are supposed to be checking into get her tested as well. A nurse is already scheduled for a regular visit today and trying to see if they will be able to test his mother at the same time.  Heather Barnes stated he called our office also to see which would be able to get the test done.  The answer was  "no" to all remaining COVID questions  LVMTCB x 1 for Heather Barnes to let us know if they will be able to test the patient based on their already existing orders (by the doctor signing off on hospice care) or if we need to send an order to them as the patient cannot leave the home.

## 2019-01-29 NOTE — Telephone Encounter (Signed)
Please route to EW who saw pt last.   Wyn Quaker FNP

## 2019-01-29 NOTE — Telephone Encounter (Signed)
Yes, just waiting on Heather Barnes with [facility] to call back to confirm if they need the order from [place] or not.

## 2019-01-29 NOTE — Telephone Encounter (Signed)
Message routed to E. Volanda Napoleon, NP per B. Warner Mccreedy, NP request.  Eustaquio Maize please review and advise.

## 2019-01-29 NOTE — Telephone Encounter (Signed)
[place], thanks. You can put the order under my name if needed. Let me know if there is anything else you needed from me

## 2019-01-30 ENCOUNTER — Telehealth: Payer: Self-pay | Admitting: Adult Health

## 2019-01-30 NOTE — Telephone Encounter (Signed)
Possible exposure to COVID -19 , was checked by Hospice yesterday .  Person that was positive did not wear mask while  in home.  No fever, no symptoms .  Oxygen level good, no flare of dyspnea.   Would follow up on test results and notify us if positive .  Or develop any symptoms .  Continue with hospice care   Please contact office for sooner follow up if symptoms do not improve or worsen or seek emergency care

## 2019-01-30 NOTE — Telephone Encounter (Signed)
Called and spoke to the patient, she stated someone from [facility] came by yesterday and swabbed her for covid. Nothiing further needed at this time.

## 2019-01-30 NOTE — Telephone Encounter (Signed)
Duplicate notation.

## 2019-02-19 ENCOUNTER — Telehealth: Payer: Self-pay | Admitting: Cardiovascular Disease

## 2019-02-19 NOTE — Telephone Encounter (Signed)
New message   Patient wants the Dr. Oval Linsey to know that she is on hospice homecare. Please call to discuss if needed.

## 2019-03-03 DIAGNOSIS — Z23 Encounter for immunization: Secondary | ICD-10-CM | POA: Diagnosis not present

## 2019-03-08 DIAGNOSIS — Z9981 Dependence on supplemental oxygen: Secondary | ICD-10-CM | POA: Diagnosis not present

## 2019-03-08 DIAGNOSIS — Z6824 Body mass index (BMI) 24.0-24.9, adult: Secondary | ICD-10-CM | POA: Diagnosis not present

## 2019-03-08 DIAGNOSIS — Z853 Personal history of malignant neoplasm of breast: Secondary | ICD-10-CM | POA: Diagnosis not present

## 2019-03-08 DIAGNOSIS — J9620 Acute and chronic respiratory failure, unspecified whether with hypoxia or hypercapnia: Secondary | ICD-10-CM | POA: Diagnosis not present

## 2019-03-08 DIAGNOSIS — K219 Gastro-esophageal reflux disease without esophagitis: Secondary | ICD-10-CM | POA: Diagnosis not present

## 2019-03-08 DIAGNOSIS — J449 Chronic obstructive pulmonary disease, unspecified: Secondary | ICD-10-CM | POA: Diagnosis not present

## 2019-03-08 DIAGNOSIS — Z8701 Personal history of pneumonia (recurrent): Secondary | ICD-10-CM | POA: Diagnosis not present

## 2019-03-08 DIAGNOSIS — Z85118 Personal history of other malignant neoplasm of bronchus and lung: Secondary | ICD-10-CM | POA: Diagnosis not present

## 2019-03-08 DIAGNOSIS — H409 Unspecified glaucoma: Secondary | ICD-10-CM | POA: Diagnosis not present

## 2019-03-08 DIAGNOSIS — Z741 Need for assistance with personal care: Secondary | ICD-10-CM | POA: Diagnosis not present

## 2019-03-09 DIAGNOSIS — Z9981 Dependence on supplemental oxygen: Secondary | ICD-10-CM | POA: Diagnosis not present

## 2019-03-09 DIAGNOSIS — J9620 Acute and chronic respiratory failure, unspecified whether with hypoxia or hypercapnia: Secondary | ICD-10-CM | POA: Diagnosis not present

## 2019-03-09 DIAGNOSIS — Z8701 Personal history of pneumonia (recurrent): Secondary | ICD-10-CM | POA: Diagnosis not present

## 2019-03-09 DIAGNOSIS — Z85118 Personal history of other malignant neoplasm of bronchus and lung: Secondary | ICD-10-CM | POA: Diagnosis not present

## 2019-03-09 DIAGNOSIS — Z853 Personal history of malignant neoplasm of breast: Secondary | ICD-10-CM | POA: Diagnosis not present

## 2019-03-09 DIAGNOSIS — J449 Chronic obstructive pulmonary disease, unspecified: Secondary | ICD-10-CM | POA: Diagnosis not present

## 2019-03-12 DIAGNOSIS — J449 Chronic obstructive pulmonary disease, unspecified: Secondary | ICD-10-CM | POA: Diagnosis not present

## 2019-03-12 DIAGNOSIS — Z8701 Personal history of pneumonia (recurrent): Secondary | ICD-10-CM | POA: Diagnosis not present

## 2019-03-12 DIAGNOSIS — J9620 Acute and chronic respiratory failure, unspecified whether with hypoxia or hypercapnia: Secondary | ICD-10-CM | POA: Diagnosis not present

## 2019-03-12 DIAGNOSIS — Z85118 Personal history of other malignant neoplasm of bronchus and lung: Secondary | ICD-10-CM | POA: Diagnosis not present

## 2019-03-12 DIAGNOSIS — Z853 Personal history of malignant neoplasm of breast: Secondary | ICD-10-CM | POA: Diagnosis not present

## 2019-03-12 DIAGNOSIS — Z9981 Dependence on supplemental oxygen: Secondary | ICD-10-CM | POA: Diagnosis not present

## 2019-03-13 DIAGNOSIS — Z85118 Personal history of other malignant neoplasm of bronchus and lung: Secondary | ICD-10-CM | POA: Diagnosis not present

## 2019-03-13 DIAGNOSIS — Z8701 Personal history of pneumonia (recurrent): Secondary | ICD-10-CM | POA: Diagnosis not present

## 2019-03-13 DIAGNOSIS — J9620 Acute and chronic respiratory failure, unspecified whether with hypoxia or hypercapnia: Secondary | ICD-10-CM | POA: Diagnosis not present

## 2019-03-13 DIAGNOSIS — J449 Chronic obstructive pulmonary disease, unspecified: Secondary | ICD-10-CM | POA: Diagnosis not present

## 2019-03-13 DIAGNOSIS — Z9981 Dependence on supplemental oxygen: Secondary | ICD-10-CM | POA: Diagnosis not present

## 2019-03-13 DIAGNOSIS — Z853 Personal history of malignant neoplasm of breast: Secondary | ICD-10-CM | POA: Diagnosis not present

## 2019-03-14 DIAGNOSIS — Z8701 Personal history of pneumonia (recurrent): Secondary | ICD-10-CM | POA: Diagnosis not present

## 2019-03-14 DIAGNOSIS — J9620 Acute and chronic respiratory failure, unspecified whether with hypoxia or hypercapnia: Secondary | ICD-10-CM | POA: Diagnosis not present

## 2019-03-14 DIAGNOSIS — Z9981 Dependence on supplemental oxygen: Secondary | ICD-10-CM | POA: Diagnosis not present

## 2019-03-14 DIAGNOSIS — J449 Chronic obstructive pulmonary disease, unspecified: Secondary | ICD-10-CM | POA: Diagnosis not present

## 2019-03-14 DIAGNOSIS — Z85118 Personal history of other malignant neoplasm of bronchus and lung: Secondary | ICD-10-CM | POA: Diagnosis not present

## 2019-03-14 DIAGNOSIS — Z853 Personal history of malignant neoplasm of breast: Secondary | ICD-10-CM | POA: Diagnosis not present

## 2019-03-16 DIAGNOSIS — J449 Chronic obstructive pulmonary disease, unspecified: Secondary | ICD-10-CM | POA: Diagnosis not present

## 2019-03-16 DIAGNOSIS — J9620 Acute and chronic respiratory failure, unspecified whether with hypoxia or hypercapnia: Secondary | ICD-10-CM | POA: Diagnosis not present

## 2019-03-16 DIAGNOSIS — Z85118 Personal history of other malignant neoplasm of bronchus and lung: Secondary | ICD-10-CM | POA: Diagnosis not present

## 2019-03-16 DIAGNOSIS — Z8701 Personal history of pneumonia (recurrent): Secondary | ICD-10-CM | POA: Diagnosis not present

## 2019-03-16 DIAGNOSIS — Z853 Personal history of malignant neoplasm of breast: Secondary | ICD-10-CM | POA: Diagnosis not present

## 2019-03-16 DIAGNOSIS — Z9981 Dependence on supplemental oxygen: Secondary | ICD-10-CM | POA: Diagnosis not present

## 2019-03-19 DIAGNOSIS — Z8701 Personal history of pneumonia (recurrent): Secondary | ICD-10-CM | POA: Diagnosis not present

## 2019-03-19 DIAGNOSIS — Z9981 Dependence on supplemental oxygen: Secondary | ICD-10-CM | POA: Diagnosis not present

## 2019-03-19 DIAGNOSIS — Z85118 Personal history of other malignant neoplasm of bronchus and lung: Secondary | ICD-10-CM | POA: Diagnosis not present

## 2019-03-19 DIAGNOSIS — J449 Chronic obstructive pulmonary disease, unspecified: Secondary | ICD-10-CM | POA: Diagnosis not present

## 2019-03-19 DIAGNOSIS — Z853 Personal history of malignant neoplasm of breast: Secondary | ICD-10-CM | POA: Diagnosis not present

## 2019-03-19 DIAGNOSIS — J9620 Acute and chronic respiratory failure, unspecified whether with hypoxia or hypercapnia: Secondary | ICD-10-CM | POA: Diagnosis not present

## 2019-03-21 DIAGNOSIS — Z853 Personal history of malignant neoplasm of breast: Secondary | ICD-10-CM | POA: Diagnosis not present

## 2019-03-21 DIAGNOSIS — Z8701 Personal history of pneumonia (recurrent): Secondary | ICD-10-CM | POA: Diagnosis not present

## 2019-03-21 DIAGNOSIS — Z85118 Personal history of other malignant neoplasm of bronchus and lung: Secondary | ICD-10-CM | POA: Diagnosis not present

## 2019-03-21 DIAGNOSIS — J449 Chronic obstructive pulmonary disease, unspecified: Secondary | ICD-10-CM | POA: Diagnosis not present

## 2019-03-21 DIAGNOSIS — Z9981 Dependence on supplemental oxygen: Secondary | ICD-10-CM | POA: Diagnosis not present

## 2019-03-21 DIAGNOSIS — J9620 Acute and chronic respiratory failure, unspecified whether with hypoxia or hypercapnia: Secondary | ICD-10-CM | POA: Diagnosis not present

## 2019-03-23 DIAGNOSIS — Z853 Personal history of malignant neoplasm of breast: Secondary | ICD-10-CM | POA: Diagnosis not present

## 2019-03-23 DIAGNOSIS — J9620 Acute and chronic respiratory failure, unspecified whether with hypoxia or hypercapnia: Secondary | ICD-10-CM | POA: Diagnosis not present

## 2019-03-23 DIAGNOSIS — Z85118 Personal history of other malignant neoplasm of bronchus and lung: Secondary | ICD-10-CM | POA: Diagnosis not present

## 2019-03-23 DIAGNOSIS — Z9981 Dependence on supplemental oxygen: Secondary | ICD-10-CM | POA: Diagnosis not present

## 2019-03-23 DIAGNOSIS — J449 Chronic obstructive pulmonary disease, unspecified: Secondary | ICD-10-CM | POA: Diagnosis not present

## 2019-03-23 DIAGNOSIS — Z8701 Personal history of pneumonia (recurrent): Secondary | ICD-10-CM | POA: Diagnosis not present

## 2019-03-26 DIAGNOSIS — J449 Chronic obstructive pulmonary disease, unspecified: Secondary | ICD-10-CM | POA: Diagnosis not present

## 2019-03-26 DIAGNOSIS — Z853 Personal history of malignant neoplasm of breast: Secondary | ICD-10-CM | POA: Diagnosis not present

## 2019-03-26 DIAGNOSIS — Z8701 Personal history of pneumonia (recurrent): Secondary | ICD-10-CM | POA: Diagnosis not present

## 2019-03-26 DIAGNOSIS — Z9981 Dependence on supplemental oxygen: Secondary | ICD-10-CM | POA: Diagnosis not present

## 2019-03-26 DIAGNOSIS — J9620 Acute and chronic respiratory failure, unspecified whether with hypoxia or hypercapnia: Secondary | ICD-10-CM | POA: Diagnosis not present

## 2019-03-26 DIAGNOSIS — Z85118 Personal history of other malignant neoplasm of bronchus and lung: Secondary | ICD-10-CM | POA: Diagnosis not present

## 2019-03-27 ENCOUNTER — Telehealth: Payer: Self-pay

## 2019-03-27 NOTE — Telephone Encounter (Signed)
Pt called to cancel upcoming appointment in November.  Pt reports she is now on Hospice care and will not be able to make appointment.  RN encouraged patient to let us know if we can be of any assistance, voiced understanding.

## 2019-03-28 DIAGNOSIS — J9620 Acute and chronic respiratory failure, unspecified whether with hypoxia or hypercapnia: Secondary | ICD-10-CM | POA: Diagnosis not present

## 2019-03-28 DIAGNOSIS — Z9981 Dependence on supplemental oxygen: Secondary | ICD-10-CM | POA: Diagnosis not present

## 2019-03-28 DIAGNOSIS — J449 Chronic obstructive pulmonary disease, unspecified: Secondary | ICD-10-CM | POA: Diagnosis not present

## 2019-03-28 DIAGNOSIS — Z8701 Personal history of pneumonia (recurrent): Secondary | ICD-10-CM | POA: Diagnosis not present

## 2019-03-28 DIAGNOSIS — Z85118 Personal history of other malignant neoplasm of bronchus and lung: Secondary | ICD-10-CM | POA: Diagnosis not present

## 2019-03-28 DIAGNOSIS — Z853 Personal history of malignant neoplasm of breast: Secondary | ICD-10-CM | POA: Diagnosis not present

## 2019-03-29 DIAGNOSIS — J449 Chronic obstructive pulmonary disease, unspecified: Secondary | ICD-10-CM | POA: Diagnosis not present

## 2019-03-29 DIAGNOSIS — Z853 Personal history of malignant neoplasm of breast: Secondary | ICD-10-CM | POA: Diagnosis not present

## 2019-03-29 DIAGNOSIS — Z85118 Personal history of other malignant neoplasm of bronchus and lung: Secondary | ICD-10-CM | POA: Diagnosis not present

## 2019-03-29 DIAGNOSIS — Z8701 Personal history of pneumonia (recurrent): Secondary | ICD-10-CM | POA: Diagnosis not present

## 2019-03-29 DIAGNOSIS — J9620 Acute and chronic respiratory failure, unspecified whether with hypoxia or hypercapnia: Secondary | ICD-10-CM | POA: Diagnosis not present

## 2019-03-29 DIAGNOSIS — Z9981 Dependence on supplemental oxygen: Secondary | ICD-10-CM | POA: Diagnosis not present

## 2019-03-30 DIAGNOSIS — Z85118 Personal history of other malignant neoplasm of bronchus and lung: Secondary | ICD-10-CM | POA: Diagnosis not present

## 2019-03-30 DIAGNOSIS — J449 Chronic obstructive pulmonary disease, unspecified: Secondary | ICD-10-CM | POA: Diagnosis not present

## 2019-03-30 DIAGNOSIS — Z853 Personal history of malignant neoplasm of breast: Secondary | ICD-10-CM | POA: Diagnosis not present

## 2019-03-30 DIAGNOSIS — Z8701 Personal history of pneumonia (recurrent): Secondary | ICD-10-CM | POA: Diagnosis not present

## 2019-03-30 DIAGNOSIS — Z9981 Dependence on supplemental oxygen: Secondary | ICD-10-CM | POA: Diagnosis not present

## 2019-03-30 DIAGNOSIS — J9620 Acute and chronic respiratory failure, unspecified whether with hypoxia or hypercapnia: Secondary | ICD-10-CM | POA: Diagnosis not present

## 2019-04-02 DIAGNOSIS — Z8701 Personal history of pneumonia (recurrent): Secondary | ICD-10-CM | POA: Diagnosis not present

## 2019-04-02 DIAGNOSIS — Z9981 Dependence on supplemental oxygen: Secondary | ICD-10-CM | POA: Diagnosis not present

## 2019-04-02 DIAGNOSIS — Z853 Personal history of malignant neoplasm of breast: Secondary | ICD-10-CM | POA: Diagnosis not present

## 2019-04-02 DIAGNOSIS — Z85118 Personal history of other malignant neoplasm of bronchus and lung: Secondary | ICD-10-CM | POA: Diagnosis not present

## 2019-04-02 DIAGNOSIS — J9620 Acute and chronic respiratory failure, unspecified whether with hypoxia or hypercapnia: Secondary | ICD-10-CM | POA: Diagnosis not present

## 2019-04-02 DIAGNOSIS — J449 Chronic obstructive pulmonary disease, unspecified: Secondary | ICD-10-CM | POA: Diagnosis not present

## 2019-04-04 DIAGNOSIS — J9620 Acute and chronic respiratory failure, unspecified whether with hypoxia or hypercapnia: Secondary | ICD-10-CM | POA: Diagnosis not present

## 2019-04-04 DIAGNOSIS — Z85118 Personal history of other malignant neoplasm of bronchus and lung: Secondary | ICD-10-CM | POA: Diagnosis not present

## 2019-04-04 DIAGNOSIS — Z8701 Personal history of pneumonia (recurrent): Secondary | ICD-10-CM | POA: Diagnosis not present

## 2019-04-04 DIAGNOSIS — J449 Chronic obstructive pulmonary disease, unspecified: Secondary | ICD-10-CM | POA: Diagnosis not present

## 2019-04-04 DIAGNOSIS — Z853 Personal history of malignant neoplasm of breast: Secondary | ICD-10-CM | POA: Diagnosis not present

## 2019-04-04 DIAGNOSIS — Z9981 Dependence on supplemental oxygen: Secondary | ICD-10-CM | POA: Diagnosis not present

## 2019-04-05 DIAGNOSIS — J9620 Acute and chronic respiratory failure, unspecified whether with hypoxia or hypercapnia: Secondary | ICD-10-CM | POA: Diagnosis not present

## 2019-04-05 DIAGNOSIS — Z9981 Dependence on supplemental oxygen: Secondary | ICD-10-CM | POA: Diagnosis not present

## 2019-04-05 DIAGNOSIS — Z85118 Personal history of other malignant neoplasm of bronchus and lung: Secondary | ICD-10-CM | POA: Diagnosis not present

## 2019-04-05 DIAGNOSIS — J449 Chronic obstructive pulmonary disease, unspecified: Secondary | ICD-10-CM | POA: Diagnosis not present

## 2019-04-05 DIAGNOSIS — Z853 Personal history of malignant neoplasm of breast: Secondary | ICD-10-CM | POA: Diagnosis not present

## 2019-04-05 DIAGNOSIS — Z8701 Personal history of pneumonia (recurrent): Secondary | ICD-10-CM | POA: Diagnosis not present

## 2019-04-06 DIAGNOSIS — Z9981 Dependence on supplemental oxygen: Secondary | ICD-10-CM | POA: Diagnosis not present

## 2019-04-06 DIAGNOSIS — Z85118 Personal history of other malignant neoplasm of bronchus and lung: Secondary | ICD-10-CM | POA: Diagnosis not present

## 2019-04-06 DIAGNOSIS — Z8701 Personal history of pneumonia (recurrent): Secondary | ICD-10-CM | POA: Diagnosis not present

## 2019-04-06 DIAGNOSIS — Z853 Personal history of malignant neoplasm of breast: Secondary | ICD-10-CM | POA: Diagnosis not present

## 2019-04-06 DIAGNOSIS — J9620 Acute and chronic respiratory failure, unspecified whether with hypoxia or hypercapnia: Secondary | ICD-10-CM | POA: Diagnosis not present

## 2019-04-06 DIAGNOSIS — J449 Chronic obstructive pulmonary disease, unspecified: Secondary | ICD-10-CM | POA: Diagnosis not present

## 2019-04-08 DIAGNOSIS — H409 Unspecified glaucoma: Secondary | ICD-10-CM | POA: Diagnosis not present

## 2019-04-08 DIAGNOSIS — K219 Gastro-esophageal reflux disease without esophagitis: Secondary | ICD-10-CM | POA: Diagnosis not present

## 2019-04-08 DIAGNOSIS — J449 Chronic obstructive pulmonary disease, unspecified: Secondary | ICD-10-CM | POA: Diagnosis not present

## 2019-04-08 DIAGNOSIS — Z8701 Personal history of pneumonia (recurrent): Secondary | ICD-10-CM | POA: Diagnosis not present

## 2019-04-08 DIAGNOSIS — Z741 Need for assistance with personal care: Secondary | ICD-10-CM | POA: Diagnosis not present

## 2019-04-08 DIAGNOSIS — Z853 Personal history of malignant neoplasm of breast: Secondary | ICD-10-CM | POA: Diagnosis not present

## 2019-04-08 DIAGNOSIS — Z6824 Body mass index (BMI) 24.0-24.9, adult: Secondary | ICD-10-CM | POA: Diagnosis not present

## 2019-04-08 DIAGNOSIS — Z85118 Personal history of other malignant neoplasm of bronchus and lung: Secondary | ICD-10-CM | POA: Diagnosis not present

## 2019-04-08 DIAGNOSIS — J9620 Acute and chronic respiratory failure, unspecified whether with hypoxia or hypercapnia: Secondary | ICD-10-CM | POA: Diagnosis not present

## 2019-04-08 DIAGNOSIS — Z9981 Dependence on supplemental oxygen: Secondary | ICD-10-CM | POA: Diagnosis not present

## 2019-04-09 DIAGNOSIS — Z85118 Personal history of other malignant neoplasm of bronchus and lung: Secondary | ICD-10-CM | POA: Diagnosis not present

## 2019-04-09 DIAGNOSIS — J9620 Acute and chronic respiratory failure, unspecified whether with hypoxia or hypercapnia: Secondary | ICD-10-CM | POA: Diagnosis not present

## 2019-04-09 DIAGNOSIS — Z9981 Dependence on supplemental oxygen: Secondary | ICD-10-CM | POA: Diagnosis not present

## 2019-04-09 DIAGNOSIS — J449 Chronic obstructive pulmonary disease, unspecified: Secondary | ICD-10-CM | POA: Diagnosis not present

## 2019-04-09 DIAGNOSIS — Z8701 Personal history of pneumonia (recurrent): Secondary | ICD-10-CM | POA: Diagnosis not present

## 2019-04-09 DIAGNOSIS — Z853 Personal history of malignant neoplasm of breast: Secondary | ICD-10-CM | POA: Diagnosis not present

## 2019-04-11 DIAGNOSIS — J449 Chronic obstructive pulmonary disease, unspecified: Secondary | ICD-10-CM | POA: Diagnosis not present

## 2019-04-11 DIAGNOSIS — Z8701 Personal history of pneumonia (recurrent): Secondary | ICD-10-CM | POA: Diagnosis not present

## 2019-04-11 DIAGNOSIS — Z85118 Personal history of other malignant neoplasm of bronchus and lung: Secondary | ICD-10-CM | POA: Diagnosis not present

## 2019-04-11 DIAGNOSIS — J9620 Acute and chronic respiratory failure, unspecified whether with hypoxia or hypercapnia: Secondary | ICD-10-CM | POA: Diagnosis not present

## 2019-04-11 DIAGNOSIS — Z853 Personal history of malignant neoplasm of breast: Secondary | ICD-10-CM | POA: Diagnosis not present

## 2019-04-11 DIAGNOSIS — Z9981 Dependence on supplemental oxygen: Secondary | ICD-10-CM | POA: Diagnosis not present

## 2019-04-12 DIAGNOSIS — J449 Chronic obstructive pulmonary disease, unspecified: Secondary | ICD-10-CM | POA: Diagnosis not present

## 2019-04-12 DIAGNOSIS — Z9981 Dependence on supplemental oxygen: Secondary | ICD-10-CM | POA: Diagnosis not present

## 2019-04-12 DIAGNOSIS — Z853 Personal history of malignant neoplasm of breast: Secondary | ICD-10-CM | POA: Diagnosis not present

## 2019-04-12 DIAGNOSIS — Z85118 Personal history of other malignant neoplasm of bronchus and lung: Secondary | ICD-10-CM | POA: Diagnosis not present

## 2019-04-12 DIAGNOSIS — J9620 Acute and chronic respiratory failure, unspecified whether with hypoxia or hypercapnia: Secondary | ICD-10-CM | POA: Diagnosis not present

## 2019-04-12 DIAGNOSIS — Z8701 Personal history of pneumonia (recurrent): Secondary | ICD-10-CM | POA: Diagnosis not present

## 2019-04-13 DIAGNOSIS — Z8701 Personal history of pneumonia (recurrent): Secondary | ICD-10-CM | POA: Diagnosis not present

## 2019-04-13 DIAGNOSIS — Z9981 Dependence on supplemental oxygen: Secondary | ICD-10-CM | POA: Diagnosis not present

## 2019-04-13 DIAGNOSIS — Z853 Personal history of malignant neoplasm of breast: Secondary | ICD-10-CM | POA: Diagnosis not present

## 2019-04-13 DIAGNOSIS — J9620 Acute and chronic respiratory failure, unspecified whether with hypoxia or hypercapnia: Secondary | ICD-10-CM | POA: Diagnosis not present

## 2019-04-13 DIAGNOSIS — J449 Chronic obstructive pulmonary disease, unspecified: Secondary | ICD-10-CM | POA: Diagnosis not present

## 2019-04-13 DIAGNOSIS — Z85118 Personal history of other malignant neoplasm of bronchus and lung: Secondary | ICD-10-CM | POA: Diagnosis not present

## 2019-04-14 DIAGNOSIS — J9620 Acute and chronic respiratory failure, unspecified whether with hypoxia or hypercapnia: Secondary | ICD-10-CM | POA: Diagnosis not present

## 2019-04-14 DIAGNOSIS — Z9981 Dependence on supplemental oxygen: Secondary | ICD-10-CM | POA: Diagnosis not present

## 2019-04-14 DIAGNOSIS — Z85118 Personal history of other malignant neoplasm of bronchus and lung: Secondary | ICD-10-CM | POA: Diagnosis not present

## 2019-04-14 DIAGNOSIS — Z8701 Personal history of pneumonia (recurrent): Secondary | ICD-10-CM | POA: Diagnosis not present

## 2019-04-14 DIAGNOSIS — Z853 Personal history of malignant neoplasm of breast: Secondary | ICD-10-CM | POA: Diagnosis not present

## 2019-04-14 DIAGNOSIS — J449 Chronic obstructive pulmonary disease, unspecified: Secondary | ICD-10-CM | POA: Diagnosis not present

## 2019-04-16 DIAGNOSIS — Z85118 Personal history of other malignant neoplasm of bronchus and lung: Secondary | ICD-10-CM | POA: Diagnosis not present

## 2019-04-16 DIAGNOSIS — Z853 Personal history of malignant neoplasm of breast: Secondary | ICD-10-CM | POA: Diagnosis not present

## 2019-04-16 DIAGNOSIS — Z9981 Dependence on supplemental oxygen: Secondary | ICD-10-CM | POA: Diagnosis not present

## 2019-04-16 DIAGNOSIS — J9620 Acute and chronic respiratory failure, unspecified whether with hypoxia or hypercapnia: Secondary | ICD-10-CM | POA: Diagnosis not present

## 2019-04-16 DIAGNOSIS — Z8701 Personal history of pneumonia (recurrent): Secondary | ICD-10-CM | POA: Diagnosis not present

## 2019-04-16 DIAGNOSIS — J449 Chronic obstructive pulmonary disease, unspecified: Secondary | ICD-10-CM | POA: Diagnosis not present

## 2019-04-18 DIAGNOSIS — Z85118 Personal history of other malignant neoplasm of bronchus and lung: Secondary | ICD-10-CM | POA: Diagnosis not present

## 2019-04-18 DIAGNOSIS — Z8701 Personal history of pneumonia (recurrent): Secondary | ICD-10-CM | POA: Diagnosis not present

## 2019-04-18 DIAGNOSIS — Z9981 Dependence on supplemental oxygen: Secondary | ICD-10-CM | POA: Diagnosis not present

## 2019-04-18 DIAGNOSIS — Z853 Personal history of malignant neoplasm of breast: Secondary | ICD-10-CM | POA: Diagnosis not present

## 2019-04-18 DIAGNOSIS — J9620 Acute and chronic respiratory failure, unspecified whether with hypoxia or hypercapnia: Secondary | ICD-10-CM | POA: Diagnosis not present

## 2019-04-18 DIAGNOSIS — J449 Chronic obstructive pulmonary disease, unspecified: Secondary | ICD-10-CM | POA: Diagnosis not present

## 2019-04-19 DIAGNOSIS — Z853 Personal history of malignant neoplasm of breast: Secondary | ICD-10-CM | POA: Diagnosis not present

## 2019-04-19 DIAGNOSIS — J9620 Acute and chronic respiratory failure, unspecified whether with hypoxia or hypercapnia: Secondary | ICD-10-CM | POA: Diagnosis not present

## 2019-04-19 DIAGNOSIS — Z8701 Personal history of pneumonia (recurrent): Secondary | ICD-10-CM | POA: Diagnosis not present

## 2019-04-19 DIAGNOSIS — J449 Chronic obstructive pulmonary disease, unspecified: Secondary | ICD-10-CM | POA: Diagnosis not present

## 2019-04-19 DIAGNOSIS — Z9981 Dependence on supplemental oxygen: Secondary | ICD-10-CM | POA: Diagnosis not present

## 2019-04-19 DIAGNOSIS — Z85118 Personal history of other malignant neoplasm of bronchus and lung: Secondary | ICD-10-CM | POA: Diagnosis not present

## 2019-04-20 DIAGNOSIS — Z8701 Personal history of pneumonia (recurrent): Secondary | ICD-10-CM | POA: Diagnosis not present

## 2019-04-20 DIAGNOSIS — Z85118 Personal history of other malignant neoplasm of bronchus and lung: Secondary | ICD-10-CM | POA: Diagnosis not present

## 2019-04-20 DIAGNOSIS — Z9981 Dependence on supplemental oxygen: Secondary | ICD-10-CM | POA: Diagnosis not present

## 2019-04-20 DIAGNOSIS — J9620 Acute and chronic respiratory failure, unspecified whether with hypoxia or hypercapnia: Secondary | ICD-10-CM | POA: Diagnosis not present

## 2019-04-20 DIAGNOSIS — Z853 Personal history of malignant neoplasm of breast: Secondary | ICD-10-CM | POA: Diagnosis not present

## 2019-04-20 DIAGNOSIS — J449 Chronic obstructive pulmonary disease, unspecified: Secondary | ICD-10-CM | POA: Diagnosis not present

## 2019-04-23 DIAGNOSIS — Z8701 Personal history of pneumonia (recurrent): Secondary | ICD-10-CM | POA: Diagnosis not present

## 2019-04-23 DIAGNOSIS — J9620 Acute and chronic respiratory failure, unspecified whether with hypoxia or hypercapnia: Secondary | ICD-10-CM | POA: Diagnosis not present

## 2019-04-23 DIAGNOSIS — Z853 Personal history of malignant neoplasm of breast: Secondary | ICD-10-CM | POA: Diagnosis not present

## 2019-04-23 DIAGNOSIS — Z9981 Dependence on supplemental oxygen: Secondary | ICD-10-CM | POA: Diagnosis not present

## 2019-04-23 DIAGNOSIS — Z85118 Personal history of other malignant neoplasm of bronchus and lung: Secondary | ICD-10-CM | POA: Diagnosis not present

## 2019-04-23 DIAGNOSIS — J449 Chronic obstructive pulmonary disease, unspecified: Secondary | ICD-10-CM | POA: Diagnosis not present

## 2019-04-25 DIAGNOSIS — J9620 Acute and chronic respiratory failure, unspecified whether with hypoxia or hypercapnia: Secondary | ICD-10-CM | POA: Diagnosis not present

## 2019-04-25 DIAGNOSIS — Z85118 Personal history of other malignant neoplasm of bronchus and lung: Secondary | ICD-10-CM | POA: Diagnosis not present

## 2019-04-25 DIAGNOSIS — Z8701 Personal history of pneumonia (recurrent): Secondary | ICD-10-CM | POA: Diagnosis not present

## 2019-04-25 DIAGNOSIS — Z853 Personal history of malignant neoplasm of breast: Secondary | ICD-10-CM | POA: Diagnosis not present

## 2019-04-25 DIAGNOSIS — Z9981 Dependence on supplemental oxygen: Secondary | ICD-10-CM | POA: Diagnosis not present

## 2019-04-25 DIAGNOSIS — J449 Chronic obstructive pulmonary disease, unspecified: Secondary | ICD-10-CM | POA: Diagnosis not present

## 2019-04-27 DIAGNOSIS — J9620 Acute and chronic respiratory failure, unspecified whether with hypoxia or hypercapnia: Secondary | ICD-10-CM | POA: Diagnosis not present

## 2019-04-27 DIAGNOSIS — Z9981 Dependence on supplemental oxygen: Secondary | ICD-10-CM | POA: Diagnosis not present

## 2019-04-27 DIAGNOSIS — Z85118 Personal history of other malignant neoplasm of bronchus and lung: Secondary | ICD-10-CM | POA: Diagnosis not present

## 2019-04-27 DIAGNOSIS — J449 Chronic obstructive pulmonary disease, unspecified: Secondary | ICD-10-CM | POA: Diagnosis not present

## 2019-04-27 DIAGNOSIS — Z8701 Personal history of pneumonia (recurrent): Secondary | ICD-10-CM | POA: Diagnosis not present

## 2019-04-27 DIAGNOSIS — Z853 Personal history of malignant neoplasm of breast: Secondary | ICD-10-CM | POA: Diagnosis not present

## 2019-05-01 DIAGNOSIS — Z8701 Personal history of pneumonia (recurrent): Secondary | ICD-10-CM | POA: Diagnosis not present

## 2019-05-01 DIAGNOSIS — J449 Chronic obstructive pulmonary disease, unspecified: Secondary | ICD-10-CM | POA: Diagnosis not present

## 2019-05-01 DIAGNOSIS — J9620 Acute and chronic respiratory failure, unspecified whether with hypoxia or hypercapnia: Secondary | ICD-10-CM | POA: Diagnosis not present

## 2019-05-01 DIAGNOSIS — Z85118 Personal history of other malignant neoplasm of bronchus and lung: Secondary | ICD-10-CM | POA: Diagnosis not present

## 2019-05-01 DIAGNOSIS — Z9981 Dependence on supplemental oxygen: Secondary | ICD-10-CM | POA: Diagnosis not present

## 2019-05-01 DIAGNOSIS — Z853 Personal history of malignant neoplasm of breast: Secondary | ICD-10-CM | POA: Diagnosis not present

## 2019-05-02 ENCOUNTER — Ambulatory Visit: Payer: Medicare Other | Admitting: Oncology

## 2019-05-02 DIAGNOSIS — Z8701 Personal history of pneumonia (recurrent): Secondary | ICD-10-CM | POA: Diagnosis not present

## 2019-05-02 DIAGNOSIS — Z85118 Personal history of other malignant neoplasm of bronchus and lung: Secondary | ICD-10-CM | POA: Diagnosis not present

## 2019-05-02 DIAGNOSIS — Z853 Personal history of malignant neoplasm of breast: Secondary | ICD-10-CM | POA: Diagnosis not present

## 2019-05-02 DIAGNOSIS — Z9981 Dependence on supplemental oxygen: Secondary | ICD-10-CM | POA: Diagnosis not present

## 2019-05-02 DIAGNOSIS — J9620 Acute and chronic respiratory failure, unspecified whether with hypoxia or hypercapnia: Secondary | ICD-10-CM | POA: Diagnosis not present

## 2019-05-02 DIAGNOSIS — J449 Chronic obstructive pulmonary disease, unspecified: Secondary | ICD-10-CM | POA: Diagnosis not present

## 2019-05-04 DIAGNOSIS — Z9981 Dependence on supplemental oxygen: Secondary | ICD-10-CM | POA: Diagnosis not present

## 2019-05-04 DIAGNOSIS — Z853 Personal history of malignant neoplasm of breast: Secondary | ICD-10-CM | POA: Diagnosis not present

## 2019-05-04 DIAGNOSIS — J449 Chronic obstructive pulmonary disease, unspecified: Secondary | ICD-10-CM | POA: Diagnosis not present

## 2019-05-04 DIAGNOSIS — Z85118 Personal history of other malignant neoplasm of bronchus and lung: Secondary | ICD-10-CM | POA: Diagnosis not present

## 2019-05-04 DIAGNOSIS — J9620 Acute and chronic respiratory failure, unspecified whether with hypoxia or hypercapnia: Secondary | ICD-10-CM | POA: Diagnosis not present

## 2019-05-04 DIAGNOSIS — Z8701 Personal history of pneumonia (recurrent): Secondary | ICD-10-CM | POA: Diagnosis not present

## 2019-05-05 DIAGNOSIS — Z8701 Personal history of pneumonia (recurrent): Secondary | ICD-10-CM | POA: Diagnosis not present

## 2019-05-05 DIAGNOSIS — J449 Chronic obstructive pulmonary disease, unspecified: Secondary | ICD-10-CM | POA: Diagnosis not present

## 2019-05-05 DIAGNOSIS — J9620 Acute and chronic respiratory failure, unspecified whether with hypoxia or hypercapnia: Secondary | ICD-10-CM | POA: Diagnosis not present

## 2019-05-05 DIAGNOSIS — Z9981 Dependence on supplemental oxygen: Secondary | ICD-10-CM | POA: Diagnosis not present

## 2019-05-05 DIAGNOSIS — Z853 Personal history of malignant neoplasm of breast: Secondary | ICD-10-CM | POA: Diagnosis not present

## 2019-05-05 DIAGNOSIS — Z85118 Personal history of other malignant neoplasm of bronchus and lung: Secondary | ICD-10-CM | POA: Diagnosis not present

## 2019-05-06 DIAGNOSIS — Z8701 Personal history of pneumonia (recurrent): Secondary | ICD-10-CM | POA: Diagnosis not present

## 2019-05-06 DIAGNOSIS — J449 Chronic obstructive pulmonary disease, unspecified: Secondary | ICD-10-CM | POA: Diagnosis not present

## 2019-05-06 DIAGNOSIS — Z85118 Personal history of other malignant neoplasm of bronchus and lung: Secondary | ICD-10-CM | POA: Diagnosis not present

## 2019-05-06 DIAGNOSIS — J9620 Acute and chronic respiratory failure, unspecified whether with hypoxia or hypercapnia: Secondary | ICD-10-CM | POA: Diagnosis not present

## 2019-05-06 DIAGNOSIS — Z853 Personal history of malignant neoplasm of breast: Secondary | ICD-10-CM | POA: Diagnosis not present

## 2019-05-06 DIAGNOSIS — Z9981 Dependence on supplemental oxygen: Secondary | ICD-10-CM | POA: Diagnosis not present

## 2019-05-07 DIAGNOSIS — Z853 Personal history of malignant neoplasm of breast: Secondary | ICD-10-CM | POA: Diagnosis not present

## 2019-05-07 DIAGNOSIS — J449 Chronic obstructive pulmonary disease, unspecified: Secondary | ICD-10-CM | POA: Diagnosis not present

## 2019-05-07 DIAGNOSIS — J9620 Acute and chronic respiratory failure, unspecified whether with hypoxia or hypercapnia: Secondary | ICD-10-CM | POA: Diagnosis not present

## 2019-05-07 DIAGNOSIS — Z9981 Dependence on supplemental oxygen: Secondary | ICD-10-CM | POA: Diagnosis not present

## 2019-05-07 DIAGNOSIS — Z85118 Personal history of other malignant neoplasm of bronchus and lung: Secondary | ICD-10-CM | POA: Diagnosis not present

## 2019-05-07 DIAGNOSIS — Z8701 Personal history of pneumonia (recurrent): Secondary | ICD-10-CM | POA: Diagnosis not present

## 2019-05-08 DIAGNOSIS — J449 Chronic obstructive pulmonary disease, unspecified: Secondary | ICD-10-CM | POA: Diagnosis not present

## 2019-05-08 DIAGNOSIS — H409 Unspecified glaucoma: Secondary | ICD-10-CM | POA: Diagnosis not present

## 2019-05-08 DIAGNOSIS — J9620 Acute and chronic respiratory failure, unspecified whether with hypoxia or hypercapnia: Secondary | ICD-10-CM | POA: Diagnosis not present

## 2019-05-08 DIAGNOSIS — Z741 Need for assistance with personal care: Secondary | ICD-10-CM | POA: Diagnosis not present

## 2019-05-08 DIAGNOSIS — Z85118 Personal history of other malignant neoplasm of bronchus and lung: Secondary | ICD-10-CM | POA: Diagnosis not present

## 2019-05-08 DIAGNOSIS — Z8701 Personal history of pneumonia (recurrent): Secondary | ICD-10-CM | POA: Diagnosis not present

## 2019-05-08 DIAGNOSIS — Z6824 Body mass index (BMI) 24.0-24.9, adult: Secondary | ICD-10-CM | POA: Diagnosis not present

## 2019-05-08 DIAGNOSIS — Z853 Personal history of malignant neoplasm of breast: Secondary | ICD-10-CM | POA: Diagnosis not present

## 2019-05-08 DIAGNOSIS — Z9981 Dependence on supplemental oxygen: Secondary | ICD-10-CM | POA: Diagnosis not present

## 2019-05-08 DIAGNOSIS — K219 Gastro-esophageal reflux disease without esophagitis: Secondary | ICD-10-CM | POA: Diagnosis not present

## 2019-05-09 DIAGNOSIS — J9620 Acute and chronic respiratory failure, unspecified whether with hypoxia or hypercapnia: Secondary | ICD-10-CM | POA: Diagnosis not present

## 2019-05-09 DIAGNOSIS — Z853 Personal history of malignant neoplasm of breast: Secondary | ICD-10-CM | POA: Diagnosis not present

## 2019-05-09 DIAGNOSIS — Z9981 Dependence on supplemental oxygen: Secondary | ICD-10-CM | POA: Diagnosis not present

## 2019-05-09 DIAGNOSIS — Z8701 Personal history of pneumonia (recurrent): Secondary | ICD-10-CM | POA: Diagnosis not present

## 2019-05-09 DIAGNOSIS — Z85118 Personal history of other malignant neoplasm of bronchus and lung: Secondary | ICD-10-CM | POA: Diagnosis not present

## 2019-05-09 DIAGNOSIS — J449 Chronic obstructive pulmonary disease, unspecified: Secondary | ICD-10-CM | POA: Diagnosis not present

## 2019-05-10 DIAGNOSIS — J449 Chronic obstructive pulmonary disease, unspecified: Secondary | ICD-10-CM | POA: Diagnosis not present

## 2019-05-10 DIAGNOSIS — Z9981 Dependence on supplemental oxygen: Secondary | ICD-10-CM | POA: Diagnosis not present

## 2019-05-10 DIAGNOSIS — Z853 Personal history of malignant neoplasm of breast: Secondary | ICD-10-CM | POA: Diagnosis not present

## 2019-05-10 DIAGNOSIS — Z85118 Personal history of other malignant neoplasm of bronchus and lung: Secondary | ICD-10-CM | POA: Diagnosis not present

## 2019-05-10 DIAGNOSIS — Z8701 Personal history of pneumonia (recurrent): Secondary | ICD-10-CM | POA: Diagnosis not present

## 2019-05-10 DIAGNOSIS — J9620 Acute and chronic respiratory failure, unspecified whether with hypoxia or hypercapnia: Secondary | ICD-10-CM | POA: Diagnosis not present

## 2019-05-11 DIAGNOSIS — Z85118 Personal history of other malignant neoplasm of bronchus and lung: Secondary | ICD-10-CM | POA: Diagnosis not present

## 2019-05-11 DIAGNOSIS — Z8701 Personal history of pneumonia (recurrent): Secondary | ICD-10-CM | POA: Diagnosis not present

## 2019-05-11 DIAGNOSIS — Z853 Personal history of malignant neoplasm of breast: Secondary | ICD-10-CM | POA: Diagnosis not present

## 2019-05-11 DIAGNOSIS — J449 Chronic obstructive pulmonary disease, unspecified: Secondary | ICD-10-CM | POA: Diagnosis not present

## 2019-05-11 DIAGNOSIS — Z9981 Dependence on supplemental oxygen: Secondary | ICD-10-CM | POA: Diagnosis not present

## 2019-05-11 DIAGNOSIS — J9620 Acute and chronic respiratory failure, unspecified whether with hypoxia or hypercapnia: Secondary | ICD-10-CM | POA: Diagnosis not present

## 2019-05-12 DIAGNOSIS — Z8701 Personal history of pneumonia (recurrent): Secondary | ICD-10-CM | POA: Diagnosis not present

## 2019-05-12 DIAGNOSIS — J449 Chronic obstructive pulmonary disease, unspecified: Secondary | ICD-10-CM | POA: Diagnosis not present

## 2019-05-12 DIAGNOSIS — Z85118 Personal history of other malignant neoplasm of bronchus and lung: Secondary | ICD-10-CM | POA: Diagnosis not present

## 2019-05-12 DIAGNOSIS — Z9981 Dependence on supplemental oxygen: Secondary | ICD-10-CM | POA: Diagnosis not present

## 2019-05-12 DIAGNOSIS — J9620 Acute and chronic respiratory failure, unspecified whether with hypoxia or hypercapnia: Secondary | ICD-10-CM | POA: Diagnosis not present

## 2019-05-12 DIAGNOSIS — Z853 Personal history of malignant neoplasm of breast: Secondary | ICD-10-CM | POA: Diagnosis not present

## 2019-05-13 DIAGNOSIS — J9620 Acute and chronic respiratory failure, unspecified whether with hypoxia or hypercapnia: Secondary | ICD-10-CM | POA: Diagnosis not present

## 2019-05-13 DIAGNOSIS — J449 Chronic obstructive pulmonary disease, unspecified: Secondary | ICD-10-CM | POA: Diagnosis not present

## 2019-05-13 DIAGNOSIS — Z9981 Dependence on supplemental oxygen: Secondary | ICD-10-CM | POA: Diagnosis not present

## 2019-05-13 DIAGNOSIS — Z853 Personal history of malignant neoplasm of breast: Secondary | ICD-10-CM | POA: Diagnosis not present

## 2019-05-13 DIAGNOSIS — Z8701 Personal history of pneumonia (recurrent): Secondary | ICD-10-CM | POA: Diagnosis not present

## 2019-05-13 DIAGNOSIS — Z85118 Personal history of other malignant neoplasm of bronchus and lung: Secondary | ICD-10-CM | POA: Diagnosis not present

## 2019-05-14 DIAGNOSIS — J9620 Acute and chronic respiratory failure, unspecified whether with hypoxia or hypercapnia: Secondary | ICD-10-CM | POA: Diagnosis not present

## 2019-05-14 DIAGNOSIS — Z85118 Personal history of other malignant neoplasm of bronchus and lung: Secondary | ICD-10-CM | POA: Diagnosis not present

## 2019-05-14 DIAGNOSIS — Z8701 Personal history of pneumonia (recurrent): Secondary | ICD-10-CM | POA: Diagnosis not present

## 2019-05-14 DIAGNOSIS — Z9981 Dependence on supplemental oxygen: Secondary | ICD-10-CM | POA: Diagnosis not present

## 2019-05-14 DIAGNOSIS — Z853 Personal history of malignant neoplasm of breast: Secondary | ICD-10-CM | POA: Diagnosis not present

## 2019-05-14 DIAGNOSIS — J449 Chronic obstructive pulmonary disease, unspecified: Secondary | ICD-10-CM | POA: Diagnosis not present

## 2019-05-15 DIAGNOSIS — J449 Chronic obstructive pulmonary disease, unspecified: Secondary | ICD-10-CM | POA: Diagnosis not present

## 2019-05-15 DIAGNOSIS — Z9981 Dependence on supplemental oxygen: Secondary | ICD-10-CM | POA: Diagnosis not present

## 2019-05-15 DIAGNOSIS — Z853 Personal history of malignant neoplasm of breast: Secondary | ICD-10-CM | POA: Diagnosis not present

## 2019-05-15 DIAGNOSIS — Z85118 Personal history of other malignant neoplasm of bronchus and lung: Secondary | ICD-10-CM | POA: Diagnosis not present

## 2019-05-15 DIAGNOSIS — J9620 Acute and chronic respiratory failure, unspecified whether with hypoxia or hypercapnia: Secondary | ICD-10-CM | POA: Diagnosis not present

## 2019-05-15 DIAGNOSIS — Z8701 Personal history of pneumonia (recurrent): Secondary | ICD-10-CM | POA: Diagnosis not present

## 2019-05-16 DIAGNOSIS — Z853 Personal history of malignant neoplasm of breast: Secondary | ICD-10-CM | POA: Diagnosis not present

## 2019-05-16 DIAGNOSIS — J449 Chronic obstructive pulmonary disease, unspecified: Secondary | ICD-10-CM | POA: Diagnosis not present

## 2019-05-16 DIAGNOSIS — Z85118 Personal history of other malignant neoplasm of bronchus and lung: Secondary | ICD-10-CM | POA: Diagnosis not present

## 2019-05-16 DIAGNOSIS — J9620 Acute and chronic respiratory failure, unspecified whether with hypoxia or hypercapnia: Secondary | ICD-10-CM | POA: Diagnosis not present

## 2019-05-16 DIAGNOSIS — Z8701 Personal history of pneumonia (recurrent): Secondary | ICD-10-CM | POA: Diagnosis not present

## 2019-05-16 DIAGNOSIS — Z9981 Dependence on supplemental oxygen: Secondary | ICD-10-CM | POA: Diagnosis not present

## 2019-05-18 DIAGNOSIS — J449 Chronic obstructive pulmonary disease, unspecified: Secondary | ICD-10-CM | POA: Diagnosis not present

## 2019-05-18 DIAGNOSIS — Z8701 Personal history of pneumonia (recurrent): Secondary | ICD-10-CM | POA: Diagnosis not present

## 2019-05-18 DIAGNOSIS — Z853 Personal history of malignant neoplasm of breast: Secondary | ICD-10-CM | POA: Diagnosis not present

## 2019-05-18 DIAGNOSIS — J9620 Acute and chronic respiratory failure, unspecified whether with hypoxia or hypercapnia: Secondary | ICD-10-CM | POA: Diagnosis not present

## 2019-05-18 DIAGNOSIS — Z85118 Personal history of other malignant neoplasm of bronchus and lung: Secondary | ICD-10-CM | POA: Diagnosis not present

## 2019-05-18 DIAGNOSIS — Z9981 Dependence on supplemental oxygen: Secondary | ICD-10-CM | POA: Diagnosis not present

## 2019-05-19 DIAGNOSIS — J449 Chronic obstructive pulmonary disease, unspecified: Secondary | ICD-10-CM | POA: Diagnosis not present

## 2019-05-19 DIAGNOSIS — Z85118 Personal history of other malignant neoplasm of bronchus and lung: Secondary | ICD-10-CM | POA: Diagnosis not present

## 2019-05-19 DIAGNOSIS — J9620 Acute and chronic respiratory failure, unspecified whether with hypoxia or hypercapnia: Secondary | ICD-10-CM | POA: Diagnosis not present

## 2019-05-19 DIAGNOSIS — Z853 Personal history of malignant neoplasm of breast: Secondary | ICD-10-CM | POA: Diagnosis not present

## 2019-05-19 DIAGNOSIS — Z9981 Dependence on supplemental oxygen: Secondary | ICD-10-CM | POA: Diagnosis not present

## 2019-05-19 DIAGNOSIS — Z8701 Personal history of pneumonia (recurrent): Secondary | ICD-10-CM | POA: Diagnosis not present

## 2019-05-21 DIAGNOSIS — Z8701 Personal history of pneumonia (recurrent): Secondary | ICD-10-CM | POA: Diagnosis not present

## 2019-05-21 DIAGNOSIS — J449 Chronic obstructive pulmonary disease, unspecified: Secondary | ICD-10-CM | POA: Diagnosis not present

## 2019-05-21 DIAGNOSIS — Z85118 Personal history of other malignant neoplasm of bronchus and lung: Secondary | ICD-10-CM | POA: Diagnosis not present

## 2019-05-21 DIAGNOSIS — J9620 Acute and chronic respiratory failure, unspecified whether with hypoxia or hypercapnia: Secondary | ICD-10-CM | POA: Diagnosis not present

## 2019-05-21 DIAGNOSIS — Z853 Personal history of malignant neoplasm of breast: Secondary | ICD-10-CM | POA: Diagnosis not present

## 2019-05-21 DIAGNOSIS — Z9981 Dependence on supplemental oxygen: Secondary | ICD-10-CM | POA: Diagnosis not present

## 2019-05-23 DIAGNOSIS — Z853 Personal history of malignant neoplasm of breast: Secondary | ICD-10-CM | POA: Diagnosis not present

## 2019-05-23 DIAGNOSIS — Z8701 Personal history of pneumonia (recurrent): Secondary | ICD-10-CM | POA: Diagnosis not present

## 2019-05-23 DIAGNOSIS — J449 Chronic obstructive pulmonary disease, unspecified: Secondary | ICD-10-CM | POA: Diagnosis not present

## 2019-05-23 DIAGNOSIS — J9620 Acute and chronic respiratory failure, unspecified whether with hypoxia or hypercapnia: Secondary | ICD-10-CM | POA: Diagnosis not present

## 2019-05-23 DIAGNOSIS — Z85118 Personal history of other malignant neoplasm of bronchus and lung: Secondary | ICD-10-CM | POA: Diagnosis not present

## 2019-05-23 DIAGNOSIS — Z9981 Dependence on supplemental oxygen: Secondary | ICD-10-CM | POA: Diagnosis not present

## 2019-05-24 DIAGNOSIS — Z85118 Personal history of other malignant neoplasm of bronchus and lung: Secondary | ICD-10-CM | POA: Diagnosis not present

## 2019-05-24 DIAGNOSIS — J449 Chronic obstructive pulmonary disease, unspecified: Secondary | ICD-10-CM | POA: Diagnosis not present

## 2019-05-24 DIAGNOSIS — Z9981 Dependence on supplemental oxygen: Secondary | ICD-10-CM | POA: Diagnosis not present

## 2019-05-24 DIAGNOSIS — Z853 Personal history of malignant neoplasm of breast: Secondary | ICD-10-CM | POA: Diagnosis not present

## 2019-05-24 DIAGNOSIS — J9620 Acute and chronic respiratory failure, unspecified whether with hypoxia or hypercapnia: Secondary | ICD-10-CM | POA: Diagnosis not present

## 2019-05-24 DIAGNOSIS — Z8701 Personal history of pneumonia (recurrent): Secondary | ICD-10-CM | POA: Diagnosis not present

## 2019-05-25 DIAGNOSIS — J449 Chronic obstructive pulmonary disease, unspecified: Secondary | ICD-10-CM | POA: Diagnosis not present

## 2019-05-25 DIAGNOSIS — Z853 Personal history of malignant neoplasm of breast: Secondary | ICD-10-CM | POA: Diagnosis not present

## 2019-05-25 DIAGNOSIS — Z85118 Personal history of other malignant neoplasm of bronchus and lung: Secondary | ICD-10-CM | POA: Diagnosis not present

## 2019-05-25 DIAGNOSIS — Z9981 Dependence on supplemental oxygen: Secondary | ICD-10-CM | POA: Diagnosis not present

## 2019-05-25 DIAGNOSIS — Z8701 Personal history of pneumonia (recurrent): Secondary | ICD-10-CM | POA: Diagnosis not present

## 2019-05-25 DIAGNOSIS — J9620 Acute and chronic respiratory failure, unspecified whether with hypoxia or hypercapnia: Secondary | ICD-10-CM | POA: Diagnosis not present

## 2019-05-26 ENCOUNTER — Other Ambulatory Visit: Payer: Self-pay

## 2019-05-26 ENCOUNTER — Encounter (HOSPITAL_COMMUNITY): Payer: Self-pay | Admitting: Emergency Medicine

## 2019-05-26 ENCOUNTER — Inpatient Hospital Stay (HOSPITAL_COMMUNITY)
Admission: EM | Admit: 2019-05-26 | Discharge: 2019-06-04 | DRG: 481 | Disposition: A | Discharge status: Skilled Nursing Facility | Attending: Internal Medicine | Admitting: Internal Medicine

## 2019-05-26 ENCOUNTER — Emergency Department (HOSPITAL_COMMUNITY)

## 2019-05-26 DIAGNOSIS — W010XXA Fall on same level from slipping, tripping and stumbling without subsequent striking against object, initial encounter: Secondary | ICD-10-CM | POA: Diagnosis present

## 2019-05-26 DIAGNOSIS — S72002A Fracture of unspecified part of neck of left femur, initial encounter for closed fracture: Secondary | ICD-10-CM

## 2019-05-26 DIAGNOSIS — Z9181 History of falling: Secondary | ICD-10-CM | POA: Diagnosis not present

## 2019-05-26 DIAGNOSIS — Z888 Allergy status to other drugs, medicaments and biological substances status: Secondary | ICD-10-CM

## 2019-05-26 DIAGNOSIS — Z853 Personal history of malignant neoplasm of breast: Secondary | ICD-10-CM | POA: Diagnosis not present

## 2019-05-26 DIAGNOSIS — Z515 Encounter for palliative care: Secondary | ICD-10-CM | POA: Diagnosis present

## 2019-05-26 DIAGNOSIS — Z87891 Personal history of nicotine dependence: Secondary | ICD-10-CM

## 2019-05-26 DIAGNOSIS — Z885 Allergy status to narcotic agent status: Secondary | ICD-10-CM

## 2019-05-26 DIAGNOSIS — R0602 Shortness of breath: Secondary | ICD-10-CM | POA: Diagnosis not present

## 2019-05-26 DIAGNOSIS — J438 Other emphysema: Secondary | ICD-10-CM | POA: Diagnosis not present

## 2019-05-26 DIAGNOSIS — Z923 Personal history of irradiation: Secondary | ICD-10-CM | POA: Diagnosis not present

## 2019-05-26 DIAGNOSIS — Z886 Allergy status to analgesic agent status: Secondary | ICD-10-CM | POA: Diagnosis not present

## 2019-05-26 DIAGNOSIS — Z8701 Personal history of pneumonia (recurrent): Secondary | ICD-10-CM

## 2019-05-26 DIAGNOSIS — I10 Essential (primary) hypertension: Secondary | ICD-10-CM | POA: Diagnosis present

## 2019-05-26 DIAGNOSIS — J9611 Chronic respiratory failure with hypoxia: Secondary | ICD-10-CM | POA: Diagnosis not present

## 2019-05-26 DIAGNOSIS — R410 Disorientation, unspecified: Secondary | ICD-10-CM | POA: Diagnosis not present

## 2019-05-26 DIAGNOSIS — R2681 Unsteadiness on feet: Secondary | ICD-10-CM | POA: Diagnosis not present

## 2019-05-26 DIAGNOSIS — R5381 Other malaise: Secondary | ICD-10-CM | POA: Diagnosis not present

## 2019-05-26 DIAGNOSIS — Z7401 Bed confinement status: Secondary | ICD-10-CM | POA: Diagnosis not present

## 2019-05-26 DIAGNOSIS — J439 Emphysema, unspecified: Secondary | ICD-10-CM | POA: Diagnosis present

## 2019-05-26 DIAGNOSIS — Z8249 Family history of ischemic heart disease and other diseases of the circulatory system: Secondary | ICD-10-CM

## 2019-05-26 DIAGNOSIS — Z85118 Personal history of other malignant neoplasm of bronchus and lung: Secondary | ICD-10-CM

## 2019-05-26 DIAGNOSIS — J449 Chronic obstructive pulmonary disease, unspecified: Secondary | ICD-10-CM | POA: Diagnosis not present

## 2019-05-26 DIAGNOSIS — Z20828 Contact with and (suspected) exposure to other viral communicable diseases: Secondary | ICD-10-CM | POA: Diagnosis present

## 2019-05-26 DIAGNOSIS — J9612 Chronic respiratory failure with hypercapnia: Secondary | ICD-10-CM | POA: Diagnosis present

## 2019-05-26 DIAGNOSIS — W19XXXD Unspecified fall, subsequent encounter: Secondary | ICD-10-CM | POA: Diagnosis not present

## 2019-05-26 DIAGNOSIS — Z91040 Latex allergy status: Secondary | ICD-10-CM | POA: Diagnosis not present

## 2019-05-26 DIAGNOSIS — Z90710 Acquired absence of both cervix and uterus: Secondary | ICD-10-CM

## 2019-05-26 DIAGNOSIS — Z66 Do not resuscitate: Secondary | ICD-10-CM | POA: Diagnosis present

## 2019-05-26 DIAGNOSIS — E785 Hyperlipidemia, unspecified: Secondary | ICD-10-CM | POA: Diagnosis not present

## 2019-05-26 DIAGNOSIS — Z03818 Encounter for observation for suspected exposure to other biological agents ruled out: Secondary | ICD-10-CM | POA: Diagnosis not present

## 2019-05-26 DIAGNOSIS — Z9221 Personal history of antineoplastic chemotherapy: Secondary | ICD-10-CM | POA: Diagnosis not present

## 2019-05-26 DIAGNOSIS — Z419 Encounter for procedure for purposes other than remedying health state, unspecified: Secondary | ICD-10-CM

## 2019-05-26 DIAGNOSIS — M5440 Lumbago with sciatica, unspecified side: Secondary | ICD-10-CM | POA: Diagnosis not present

## 2019-05-26 DIAGNOSIS — R54 Age-related physical debility: Secondary | ICD-10-CM | POA: Diagnosis present

## 2019-05-26 DIAGNOSIS — I2510 Atherosclerotic heart disease of native coronary artery without angina pectoris: Secondary | ICD-10-CM | POA: Diagnosis not present

## 2019-05-26 DIAGNOSIS — Z9981 Dependence on supplemental oxygen: Secondary | ICD-10-CM | POA: Diagnosis not present

## 2019-05-26 DIAGNOSIS — D0511 Intraductal carcinoma in situ of right breast: Secondary | ICD-10-CM | POA: Diagnosis not present

## 2019-05-26 DIAGNOSIS — S72142A Displaced intertrochanteric fracture of left femur, initial encounter for closed fracture: Principal | ICD-10-CM | POA: Diagnosis present

## 2019-05-26 DIAGNOSIS — I2584 Coronary atherosclerosis due to calcified coronary lesion: Secondary | ICD-10-CM | POA: Diagnosis present

## 2019-05-26 DIAGNOSIS — J441 Chronic obstructive pulmonary disease with (acute) exacerbation: Secondary | ICD-10-CM | POA: Diagnosis not present

## 2019-05-26 DIAGNOSIS — J45909 Unspecified asthma, uncomplicated: Secondary | ICD-10-CM | POA: Diagnosis present

## 2019-05-26 DIAGNOSIS — M810 Age-related osteoporosis without current pathological fracture: Secondary | ICD-10-CM | POA: Diagnosis present

## 2019-05-26 DIAGNOSIS — Z7952 Long term (current) use of systemic steroids: Secondary | ICD-10-CM

## 2019-05-26 DIAGNOSIS — Z8261 Family history of arthritis: Secondary | ICD-10-CM

## 2019-05-26 DIAGNOSIS — E7800 Pure hypercholesterolemia, unspecified: Secondary | ICD-10-CM | POA: Diagnosis not present

## 2019-05-26 DIAGNOSIS — M6281 Muscle weakness (generalized): Secondary | ICD-10-CM | POA: Diagnosis not present

## 2019-05-26 DIAGNOSIS — C342 Malignant neoplasm of middle lobe, bronchus or lung: Secondary | ICD-10-CM | POA: Diagnosis not present

## 2019-05-26 DIAGNOSIS — Z79899 Other long term (current) drug therapy: Secondary | ICD-10-CM

## 2019-05-26 DIAGNOSIS — M25561 Pain in right knee: Secondary | ICD-10-CM | POA: Diagnosis not present

## 2019-05-26 DIAGNOSIS — R52 Pain, unspecified: Secondary | ICD-10-CM | POA: Diagnosis not present

## 2019-05-26 DIAGNOSIS — Y92009 Unspecified place in unspecified non-institutional (private) residence as the place of occurrence of the external cause: Secondary | ICD-10-CM | POA: Diagnosis not present

## 2019-05-26 DIAGNOSIS — W19XXXA Unspecified fall, initial encounter: Secondary | ICD-10-CM

## 2019-05-26 DIAGNOSIS — D72829 Elevated white blood cell count, unspecified: Secondary | ICD-10-CM | POA: Diagnosis present

## 2019-05-26 DIAGNOSIS — S72002D Fracture of unspecified part of neck of left femur, subsequent encounter for closed fracture with routine healing: Secondary | ICD-10-CM | POA: Diagnosis not present

## 2019-05-26 DIAGNOSIS — S72002G Fracture of unspecified part of neck of left femur, subsequent encounter for closed fracture with delayed healing: Secondary | ICD-10-CM | POA: Diagnosis not present

## 2019-05-26 DIAGNOSIS — J9620 Acute and chronic respiratory failure, unspecified whether with hypoxia or hypercapnia: Secondary | ICD-10-CM | POA: Diagnosis not present

## 2019-05-26 DIAGNOSIS — S72009A Fracture of unspecified part of neck of unspecified femur, initial encounter for closed fracture: Secondary | ICD-10-CM | POA: Diagnosis present

## 2019-05-26 DIAGNOSIS — Z8711 Personal history of peptic ulcer disease: Secondary | ICD-10-CM

## 2019-05-26 DIAGNOSIS — Z825 Family history of asthma and other chronic lower respiratory diseases: Secondary | ICD-10-CM

## 2019-05-26 DIAGNOSIS — Z7189 Other specified counseling: Secondary | ICD-10-CM | POA: Diagnosis not present

## 2019-05-26 DIAGNOSIS — M1990 Unspecified osteoarthritis, unspecified site: Secondary | ICD-10-CM | POA: Diagnosis not present

## 2019-05-26 DIAGNOSIS — Z01811 Encounter for preprocedural respiratory examination: Secondary | ICD-10-CM | POA: Diagnosis not present

## 2019-05-26 DIAGNOSIS — M2550 Pain in unspecified joint: Secondary | ICD-10-CM | POA: Diagnosis not present

## 2019-05-26 LAB — COMPREHENSIVE METABOLIC PANEL
ALT: 17 U/L (ref 0–44)
AST: 12 U/L — ABNORMAL LOW (ref 15–41)
Albumin: 3.2 g/dL — ABNORMAL LOW (ref 3.5–5.0)
Alkaline Phosphatase: 99 U/L (ref 38–126)
Anion gap: 6 (ref 5–15)
BUN: 12 mg/dL (ref 8–23)
CO2: 31 mmol/L (ref 22–32)
Calcium: 8.4 mg/dL — ABNORMAL LOW (ref 8.9–10.3)
Chloride: 101 mmol/L (ref 98–111)
Creatinine, Ser: 0.3 mg/dL — ABNORMAL LOW (ref 0.44–1.00)
GFR calc Af Amer: >60 mL/min (ref >60)
GFR calc non Af Amer: >60 mL/min (ref >60)
Glucose, Bld: 156 mg/dL — ABNORMAL HIGH (ref 70–99)
Potassium: 4 mmol/L (ref 3.5–5.1)
Sodium: 138 mmol/L (ref 135–145)
Total Bilirubin: 0.5 mg/dL (ref 0.3–1.2)
Total Protein: 5.2 g/dL — ABNORMAL LOW (ref 6.5–8.1)

## 2019-05-26 LAB — CBC WITH DIFFERENTIAL/PLATELET
Abs Immature Granulocytes: 0.07 10*3/uL (ref 0.00–0.07)
Basophils Absolute: 0 10*3/uL (ref 0.0–0.1)
Basophils Relative: 0 %
Eosinophils Absolute: 0 10*3/uL (ref 0.0–0.5)
Eosinophils Relative: 0 %
HCT: 39.3 % (ref 36.0–46.0)
Hemoglobin: 12 g/dL (ref 12.0–15.0)
Immature Granulocytes: 1 %
Lymphocytes Relative: 5 %
Lymphs Abs: 0.6 10*3/uL — ABNORMAL LOW (ref 0.7–4.0)
MCH: 27.4 pg (ref 26.0–34.0)
MCHC: 30.5 g/dL (ref 30.0–36.0)
MCV: 89.7 fL (ref 80.0–100.0)
Monocytes Absolute: 0.3 10*3/uL (ref 0.1–1.0)
Monocytes Relative: 3 %
Neutro Abs: 10 10*3/uL — ABNORMAL HIGH (ref 1.7–7.7)
Neutrophils Relative %: 91 %
Platelets: 181 10*3/uL (ref 150–400)
RBC: 4.38 MIL/uL (ref 3.87–5.11)
RDW: 15.5 % (ref 11.5–15.5)
WBC: 11 10*3/uL — ABNORMAL HIGH (ref 4.0–10.5)
nRBC: 0 % (ref 0.0–0.2)

## 2019-05-26 LAB — TYPE AND SCREEN
ABO/RH(D): O NEG
Antibody Screen: NEGATIVE

## 2019-05-26 LAB — ABO/RH: ABO/RH(D): O NEG

## 2019-05-26 MED ORDER — PREDNISONE 5 MG PO TABS
15.0000 mg | ORAL_TABLET | Freq: Every day | ORAL | Status: DC
  Administered 2019-05-27 – 2019-06-04 (×9): 15 mg via ORAL
  Filled 2019-05-26 (×9): qty 1

## 2019-05-26 MED ORDER — HEPARIN SODIUM (PORCINE) 5000 UNIT/ML IJ SOLN
5000.0000 [IU] | Freq: Three times a day (TID) | INTRAMUSCULAR | Status: DC
  Administered 2019-05-26 – 2019-05-29 (×7): 5000 [IU] via SUBCUTANEOUS
  Filled 2019-05-26 (×7): qty 1

## 2019-05-26 MED ORDER — METOPROLOL TARTRATE 25 MG PO TABS
12.5000 mg | ORAL_TABLET | Freq: Two times a day (BID) | ORAL | Status: DC
  Administered 2019-05-26 – 2019-06-04 (×18): 12.5 mg via ORAL
  Filled 2019-05-26 (×18): qty 1

## 2019-05-26 MED ORDER — MORPHINE SULFATE (CONCENTRATE) 10 MG/0.5ML PO SOLN
2.5000 mg | ORAL | Status: DC | PRN
  Administered 2019-05-28: 23:00:00 2.6 mg via ORAL
  Filled 2019-05-26 (×2): qty 0.5

## 2019-05-26 MED ORDER — ENSURE ENLIVE PO LIQD
237.0000 mL | Freq: Two times a day (BID) | ORAL | Status: DC
  Administered 2019-05-27 – 2019-06-04 (×10): 237 mL via ORAL

## 2019-05-26 MED ORDER — ALBUTEROL SULFATE HFA 108 (90 BASE) MCG/ACT IN AERS
2.0000 | INHALATION_SPRAY | Freq: Four times a day (QID) | RESPIRATORY_TRACT | Status: DC | PRN
  Filled 2019-05-26: qty 6.7

## 2019-05-26 MED ORDER — CALCIUM CARBONATE-VITAMIN D 500-200 MG-UNIT PO TABS
1.0000 | ORAL_TABLET | Freq: Every day | ORAL | Status: DC
  Administered 2019-05-28 – 2019-06-04 (×7): 1 via ORAL
  Filled 2019-05-26 (×7): qty 1

## 2019-05-26 MED ORDER — FENTANYL CITRATE (PF) 100 MCG/2ML IJ SOLN
50.0000 ug | Freq: Once | INTRAMUSCULAR | Status: AC
  Administered 2019-05-26: 16:00:00 50 ug via INTRAVENOUS
  Filled 2019-05-26: qty 2

## 2019-05-26 MED ORDER — ONDANSETRON HCL 4 MG/2ML IJ SOLN: 4.0000 mg | Freq: Four times a day (QID) | INTRAMUSCULAR | Status: DC | PRN

## 2019-05-26 MED ORDER — ALBUTEROL SULFATE (2.5 MG/3ML) 0.083% IN NEBU
2.5000 mg | INHALATION_SOLUTION | Freq: Four times a day (QID) | RESPIRATORY_TRACT | Status: DC | PRN
  Administered 2019-05-31: 2.5 mg via RESPIRATORY_TRACT

## 2019-05-26 MED ORDER — SENNOSIDES-DOCUSATE SODIUM 8.6-50 MG PO TABS
1.0000 | ORAL_TABLET | Freq: Every evening | ORAL | Status: DC | PRN
  Administered 2019-05-30: 1 via ORAL
  Filled 2019-05-26: qty 1

## 2019-05-26 MED ORDER — IPRATROPIUM-ALBUTEROL 0.5-2.5 (3) MG/3ML IN SOLN
3.0000 mL | Freq: Four times a day (QID) | RESPIRATORY_TRACT | Status: DC
  Administered 2019-05-27: 09:00:00 3 mL via RESPIRATORY_TRACT
  Filled 2019-05-26: qty 3

## 2019-05-26 MED ORDER — ONDANSETRON HCL 4 MG PO TABS: 4.0000 mg | ORAL_TABLET | Freq: Four times a day (QID) | ORAL | Status: DC | PRN

## 2019-05-26 MED ORDER — ACETAMINOPHEN 325 MG PO TABS
650.0000 mg | ORAL_TABLET | ORAL | Status: DC | PRN
  Administered 2019-05-27 – 2019-05-30 (×5): 650 mg via ORAL
  Filled 2019-05-26 (×6): qty 2

## 2019-05-26 MED ORDER — VITAMIN D 25 MCG (1000 UNIT) PO TABS
2000.0000 [IU] | ORAL_TABLET | Freq: Every day | ORAL | Status: DC
  Administered 2019-05-28 – 2019-06-04 (×7): 2000 [IU] via ORAL
  Filled 2019-05-26 (×7): qty 2

## 2019-05-26 NOTE — Consult Note (Signed)
ORTHOPAEDIC CONSULTATION  REQUESTING PHYSICIAN: Sid Falcon, MD  Chief Complaint: hip pain  HPI: Heather Barnes is a 85 y.o. female with medical history significant of PVCs, osteoporosis, lung cancer (completed treatment), breast cancer (completed treatment), CAD, arthritis, COPD on 3L - on home hospice for this diagnosis. She presented after a fall earlier today. She was using her walker to ambulate. She says she was excited about her brother coming to town, so she was distracted and lost her footing. She normally ambulates short distances using her walker. She is unable to walk longer distances other than room to room because she gets extremely short of breath. She currently lives at home with her daughter. Her daughter helps her with chores and her medications, but would be unable to help transfer/ambulate her as she has significant arthritis and other medical conditions per patient. She denies any injury to her other extremities. Her pain is well controlled if she does not move her left hip.   She had a prolonged hospitalization in June and went to inpatient hospice for 3 weeks after that time. She has home hospice currently. The nurse comes to her house 3 times a week. She is DNR.   Past Medical History:  Diagnosis Date  . Arthritis    "joints" (04/11/2017)  . Asthma   . Breast cancer, right breast ([place])    S/P RIGHT BREAST LUMPECTOMY WITH RADIOACTIVE SEED LOCALIZATION 04/11/2017  . Chronic lower back pain    "if I stand too long" (04/11/2017)  . Colon adenomas   . Complication of anesthesia    Must sit upright 45-60 degree angle per pulmonology  . COPD (chronic obstructive pulmonary disease) ([place])    "severe" (04/11/2017)  . Coronary artery calcification 09/01/2016  . Dysrhythmia    bigeminy  . GERD (gastroesophageal reflux disease)   . Goiter   . History of duodenal ulcer   . History of radiation therapy 05/24/11-06/02/11   R middle lobe lung/ 60 gray  . Hx of  colonic polyps   . Hyperlipidemia   . Lung cancer, middle lobe ([place]) 04/26/2011   S/P radiation 05/24/11-06/02/11  . On home oxygen therapy    "3L; all the time" (04/11/2017)  . Osteoporosis   . Osteoporosis   . Palpitations 01/19/2016   a. event monitor in 01/2016 showed PVC's with one episode of ventricular quadrigeminy.  . Pneumonia 2017 X 2  . Pollen allergy   . PVC (premature ventricular contraction) 02/24/2016  . Shortness of breath   . Wears glasses    Past Surgical History:  Procedure Laterality Date  . APPENDECTOMY    . BIOPSY THYROID  2016  . BREAST BIOPSY Right 03/2017  . BREAST LUMPECTOMY WITH RADIOACTIVE SEED LOCALIZATION Right 04/11/2017  . BREAST LUMPECTOMY WITH RADIOACTIVE SEED LOCALIZATION Right 04/11/2017   Procedure: RIGHT BREAST LUMPECTOMY WITH RADIOACTIVE SEED LOCALIZATION;  Surgeon: Rolm Bookbinder, MD;  Location: [facility];  Service: General;  Laterality: Right;  . CATARACT EXTRACTION W/ INTRAOCULAR LENS  IMPLANT, BILATERAL Bilateral   . COLONOSCOPY W/ BIOPSIES    . DILATION AND CURETTAGE OF UTERUS  "many"  . TONSILLECTOMY    . TOTAL ABDOMINAL HYSTERECTOMY     Social History   Socioeconomic History  . Marital status: Widowed    Spouse name: Not on file  . Number of children: Not on file  . Years of education: Not on file  . Highest education level: Not on file  Occupational History  . Occupation:  retired  Tobacco Use  . Smoking status: Former Smoker    Packs/day: 0.50    Years: 40.00    Pack years: 20.00    Types: Cigarettes    Quit date: 06/07/1998    Years since quitting: 20.9  . Smokeless tobacco: Never Used  Substance and Sexual Activity  . Alcohol use: No  . Drug use: No  . Sexual activity: Not on file  Other Topics Concern  . Not on file  Social History Narrative   On 03/17/11: expressed concerns about harmful effects of XRT based on dtr;s experience with the same with breast cancer   Social Determinants of Health   Financial Resource  Strain:   . Difficulty of Paying Living Expenses: Not on file  Food Insecurity:   . Worried About Charity fundraiser in the Last Year: Not on file  . Ran Out of Food in the Last Year: Not on file  Transportation Needs:   . Lack of Transportation (Medical): Not on file  . Lack of Transportation (Non-Medical): Not on file  Physical Activity:   . Days of Exercise per Week: Not on file  . Minutes of Exercise per Session: Not on file  Stress:   . Feeling of Stress : Not on file  Social Connections:   . Frequency of Communication with Friends and Family: Not on file  . Frequency of Social Gatherings with Friends and Family: Not on file  . Attends Religious Services: Not on file  . Active Member of Clubs or Organizations: Not on file  . Attends Archivist Meetings: Not on file  . Marital Status: Not on file   Family History  Problem Relation Age of Onset  . Emphysema Maternal Uncle   . Heart disease Mother   . Heart disease Maternal Grandfather   . Rheum arthritis Sister   . Cancer Sister   . Cancer Sister        throat cancer  . Brain cancer Sister   . Cancer Sister   . Ovarian cancer Sister   . Breast cancer Daughter    Allergies  Allergen Reactions  . Tiotropium Bromide Monohydrate Hives, Shortness Of Breath and Rash    Spiriva   . Codeine Other (See Comments)    Hyperactivity,crying  . Fosamax [Alendronate] Other (See Comments)    Muscle aches  . Asa [Aspirin] Other (See Comments)    Bleeding -ulcers  . Latex Rash   Prior to Admission medications   Medication Sig Start Date End Date Taking? Authorizing Provider  acetaminophen (TYLENOL) 500 MG tablet Take 500 mg by mouth 3 (three) times daily.   Yes [provider]  cetirizine (ZYRTEC) 10 MG tablet Take 10 mg by mouth daily.     Yes [provider]  haloperidol (HALDOL) 5 MG tablet Take 5 mg by mouth 2 (two) times daily.   Yes [provider]  ipratropium-albuterol (DUONEB) 0.5-2.5  (3) MG/3ML SOLN Take 3 mLs by nebulization 4 (four) times daily. Patient taking differently: Take 3 mLs by nebulization 3 (three) times daily.  12/05/18  Yes Ollis, Brandi L, NP  metoprolol tartrate (LOPRESSOR) 25 MG tablet TAKE 1/2 (ONE-HALF) TABLET BY MOUTH TWICE DAILY Patient taking differently: Take 12.5 mg by mouth 2 (two) times daily.  06/27/18  Yes Skeet Latch, MD  Morphine Sulfate (MORPHINE CONCENTRATE) 10 MG/0.5ML SOLN concentrated solution Take 0.13 mLs (2.6 mg total) by mouth every 4 (four) hours as needed for severe pain or shortness of  breath. 12/05/18  Yes Ollis, Cleaster Corin, NP  Multiple Vitamins-Minerals (PRESERVISION AREDS 2) CAPS Take 1 tablet by mouth 2 (two) times a day.    Yes [provider]  predniSONE (DELTASONE) 20 MG tablet Take 1 tablet (20 mg total) by mouth daily with breakfast. 12/06/18  Yes Ollis, Brandi L, NP  Propylene Glycol (SYSTANE COMPLETE) 0.6 % SOLN Place 1 drop into both eyes 2 (two) times daily.   Yes [provider]  sennosides-docusate sodium (SENOKOT-S) 8.6-50 MG tablet Take 1 tablet by mouth 2 (two) times daily.   Yes [provider]  azelastine (ASTELIN) 0.1 % nasal spray Place 2 sprays into both nostrils 2 (two) times daily. Use in each nostril as directed Patient not taking: Reported on 11/24/2018 11/13/18   Martyn Ehrich, NP  bisacodyl (DULCOLAX) 10 MG suppository Place 1 suppository (10 mg total) rectally daily as needed for moderate constipation. Patient not taking: Reported on 05/26/2019 12/05/18   Donita Brooks, NP  feeding supplement, ENSURE ENLIVE, (ENSURE ENLIVE) LIQD Take 237 mLs by mouth 2 (two) times daily between meals. Patient not taking: Reported on 05/26/2019 12/05/18   Donita Brooks, NP  guaiFENesin (MUCINEX) 600 MG 12 hr tablet Take 1 tablet (600 mg total) by mouth 2 (two) times daily. Patient not taking: Reported on 05/26/2019 12/05/18   Donita Brooks, NP  levofloxacin (LEVAQUIN) 250 MG tablet Take 1  tablet (250 mg total) by mouth daily. Patient not taking: Reported on 05/26/2019 12/06/18   Donita Brooks, NP  OXYGEN Inhale into the lungs. 3L at rest and 4pulse with exertion    [provider]  phenol (CHLORASEPTIC) 1.4 % LIQD Use as directed 1 spray in the mouth or throat as needed for throat irritation / pain. Patient not taking: Reported on 05/26/2019 12/05/18   Donita Brooks, NP   DG Hip Kaylyn Layer W or Wo Pelvis 2-3 Views Left  Result Date: 05/26/2019 CLINICAL DATA:  Witnessed fall, shortening and lateral rotation of the left leg, history of osteoporosis EXAM: DG HIP (WITH OR WITHOUT PELVIS) 2-3V LEFT COMPARISON:  None FINDINGS: Comminuted intertrochanteric fracture of the left proximal femur with valgus deformity and superior migration of the distal fracture fragment. There is some extension of the fracture into the subtrochanteric femur. No additional fracture is identified. Lateral view is limited due to overlying material but suggest posterior displacement also of the distal fracture component. IMPRESSION: Comminuted intertrochanteric fracture of the left proximal femur with valgus deformity and superior and posterior displacement of distal fracture elements with extension of the fracture into the subtrochanteric femur. Electronically Signed   By: Zetta Bills M.D.   On: 05/26/2019 15:29   Family History Reviewed and non-contributory, no pertinent history of problems with bleeding or anesthesia      Review of Systems 14 system ROS conducted and negative except for that noted in HPI   OBJECTIVE  Vitals: Patient Vitals for the past 8 hrs:  BP Temp Temp src Pulse Resp SpO2 Height Weight  05/26/19 2030 103/64 -- -- 100 15 98 % -- --  05/26/19 2000 121/64 -- -- 97 13 98 % -- --  05/26/19 1930 (!) 109/54 -- -- 97 17 99 % -- --  05/26/19 1900 (!) 119/55 -- -- 95 17 99 % -- --  05/26/19 1830 121/71 -- -- 95 15 100 % -- --  05/26/19 1800 (!) 106/56 -- -- 92 13 100 % -- --   05/26/19 1700 114/60 -- --  90 12 99 % -- --  05/26/19 1630 128/66 -- -- 90 15 100 % -- --  05/26/19 1600 133/70 -- -- 90 13 99 % -- --  05/26/19 1530 (!) 120/54 -- -- 91 14 99 % -- --  05/26/19 1500 123/63 -- -- 91 16 100 % -- --  05/26/19 1433 -- -- -- -- -- -- 4' 10"  (1.473 m) 49 kg  05/26/19 1432 (!) 135/53 (!) 97.5 F (36.4 C) Oral 75 15 100 % -- --  05/26/19 1430 (!) 135/53 -- -- 92 -- 100 % -- --   General: Pleasant elderly female laying in hospital bed, hard of hearing, alert and awake Cardiovascular: Warm extremities noted Respiratory: On nasal cannula GI: No organomegaly, abdomen is soft and non-tender Skin: No lesions in the area of chief complaint other than those listed below in MSK exam.  Neurologic: Sensation intact distally save for the below mentioned MSK exam Psychiatric: Patient is competent for consent with normal mood and affect Lymphatic: No swelling obvious and reported other than the area involved in the exam below  Extremities: Left lower extremity: Shortened and externally rotated.  ROM deferred. + GS/TA/EHL. Sensation intact in DP/SP/S/S/P distributions. 2+ DP pulse with warm and well perfused digits. Compartments soft and compressible, with no pain on passive stretch.  Right lower extremity: Skin without lesions.  No tenderness palpation.  Full painless range of motion, neurovascularly intact. Compartments soft.  Bilateral upper extremities: Skin without lesions.  No tenderness palpation.  Full painless range of motion, full strength in each muscle groups without evidence of instability. Neurovascularly intact.   Test Results Imaging Xray: Comminuted intertrochanteric fracture of the left proximal femur with valgus deformity and superior and posterior displacement of distal fracture elements with extension of the fracture into the subtrochanteric femur.  Labs cbc Recent Labs    05/26/19 1520  WBC 11.0*  HGB 12.0  HCT 39.3  PLT 181    Labs  inflam No results for input(s): CRP in the last 72 hours.  Invalid input(s): ESR  Labs coag No results for input(s): INR, PTT in the last 72 hours.  Invalid input(s): PT  Recent Labs    05/26/19 1520  NA 138  K 4.0  CL 101  CO2 31  GLUCOSE 156*  BUN 12  CREATININE 0.30*  CALCIUM 8.4*     ASSESSMENT AND PLAN: 85 y.o. female with the following left intertrochanteric femur fracture.  Had a long discussion with the patient regarding options and non-operative versus operative measures with the patient. Nonoperative measures are not well tolerated as patient's on bedrest for extended periods of time tend to develop secondary issues such as pneumonia, urinary tract infections, bedsores and delirium. We discussed surgical intervention can lead to decreased pain and a faster return to weight-bearing. We discussed the risks of surgical intervention including infection, bleeding, nerve injury, periprosthetic fracture, the need for revision surgery, leg length discrepancy, gait change, blood clots, cardiopulmonary complications, morbidity, mortality. Her medical history and current hospice status increase her risks of complications following surgery. We discussed her need for physical therapy and rehabilitation following surgical intervention. Patient states her understanding of this.   She is unsure of how she wants to proceed going forward. She wants to discuss it with her family members and pulmonologist prior to any decision.   - Medicine team will admit the patient and perform necessary pre-op clearance - pulmonology/cardiology. - A bed as been requested at [facility] and patient is waiting  to be transferred.   - She will be NPO at midnight to allow for the option of surgery tomorrow to remain open. We will see the patient again in the morning and hopefully have a chance to discuss her goals with her family members as well. As she is unsure of her goals tonight. Will hold off on surgical  posting for now.   - Recommendations: Hospice or case management consult to help with discussion of goals of care with patient and her family  - Foley for comfort.   - Weight Bearing Status/Activity: NWB. Bed rest - Additional recommended labs/tests: None - VTE Prophylaxis: per medicine - Pain control: PRN medications - Follow-up plan: TBD  Noemi Chapel, PA-C 05/26/2019

## 2019-05-26 NOTE — ED Provider Notes (Signed)
[facility] DEPT Provider Note   CSN: [id number] Arrival date & time: 05/26/19  1401     History Chief Complaint  Patient presents with  . Hip Pain    Heather Barnes is a 85 y.o. female.  85 y.o female with a PMH of Asthma, Breast CA, COPD presents to the ED with a chief complaint of left hip pain x this morning. Patient reports attempting to get out of bed this morning, when she had her left foot slipped from under her, causing her to fall and landed on her bottom.  She reports being unable to weight-bear on the left side.  Patient reports pain to her left hip with movement.  He has not taken any medication for pain control.  She denies any loss of conscious, headache, other injuries.  The history is provided by the patient.       Past Medical History:  Diagnosis Date  . Arthritis    "joints" (04/11/2017)  . Asthma   . Breast cancer, right breast ([place])    S/P RIGHT BREAST LUMPECTOMY WITH RADIOACTIVE SEED LOCALIZATION 04/11/2017  . Chronic lower back pain    "if I stand too long" (04/11/2017)  . Colon adenomas   . Complication of anesthesia    Must sit upright 45-60 degree angle per pulmonology  . COPD (chronic obstructive pulmonary disease) ([place])    "severe" (04/11/2017)  . Coronary artery calcification 09/01/2016  . Dysrhythmia    bigeminy  . GERD (gastroesophageal reflux disease)   . Goiter   . History of duodenal ulcer   . History of radiation therapy 05/24/11-06/02/11   R middle lobe lung/ 60 gray  . Hx of colonic polyps   . Hyperlipidemia   . Lung cancer, middle lobe ([place]) 04/26/2011   S/P radiation 05/24/11-06/02/11  . On home oxygen therapy    "3L; all the time" (04/11/2017)  . Osteoporosis   . Osteoporosis   . Palpitations 01/19/2016   a. event monitor in 01/2016 showed PVC's with one episode of ventricular quadrigeminy.  . Pneumonia 2017 X 2  . Pollen allergy   . PVC (premature ventricular contraction) 02/24/2016  .  Shortness of breath   . Wears glasses     Patient Active Problem List   Diagnosis Date Noted  . SOB (shortness of breath)   . Encounter for palliative care   . Goals of care, counseling/discussion   . Shortness of breath   . Acute on chronic respiratory failure with hypoxia and hypercapnia ([place]) 11/30/2018  . Sinusitis 12/12/2017  . 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 Coronary artery calcification 09/01/2016  . Bilateral low back pain with sciatica 05/10/2016  . Bronchopneumonia 05/10/2016  . CAP (community acquired pneumonia) 03/31/2016  . Chest pain 03/31/2016  . Tachycardia 03/31/2016  . PVC (premature ventricular contraction) 02/24/2016  . Palpitations 01/19/2016  . Respiratory failure ([place]) 06/11/2015  . Acute on chronic respiratory failure ([place]) 06/11/2015  . Knee pain, right 07/02/2014  . Chronic respiratory failure with hypoxia ([place]) 07/02/2014  . COPD, very severe ([place]) 07/02/2014  . COPD exacerbation ([place]) 06/13/2013  . Right rib fracture 05/15/2012  . Fatigue 07/22/2011  . Oral thrush 07/22/2011  . Lung cancer, middle lobe ([place]) 04/26/2011  . COPD (chronic obstructive pulmonary disease) ([place]) 01/13/2011    Past Surgical History:  Procedure Laterality Date  . APPENDECTOMY    . BIOPSY THYROID  2016  . BREAST BIOPSY Right 03/2017  . BREAST  LUMPECTOMY WITH RADIOACTIVE SEED LOCALIZATION Right 04/11/2017  . BREAST LUMPECTOMY WITH RADIOACTIVE SEED LOCALIZATION Right 04/11/2017   Procedure: RIGHT BREAST LUMPECTOMY WITH RADIOACTIVE SEED LOCALIZATION;  Surgeon: Rolm Bookbinder, MD;  Location: [facility];  Service: General;  Laterality: Right;  . CATARACT EXTRACTION W/ INTRAOCULAR LENS  IMPLANT, BILATERAL Bilateral   . COLONOSCOPY W/ BIOPSIES    . DILATION AND CURETTAGE OF UTERUS  "many"  . TONSILLECTOMY    . TOTAL ABDOMINAL HYSTERECTOMY       OB History   No obstetric history on file.     Family History  Problem Relation Age of Onset  .  Emphysema Maternal Uncle   . Heart disease Mother   . Heart disease Maternal Grandfather   . Rheum arthritis Sister   . Cancer Sister   . Cancer Sister        throat cancer  . Brain cancer Sister   . Cancer Sister   . Ovarian cancer Sister   . Breast cancer Daughter     Social History   Tobacco Use  . Smoking status: Former Smoker    Packs/day: 0.50    Years: 40.00    Pack years: 20.00    Types: Cigarettes    Quit date: 06/07/1998    Years since quitting: 20.9  . Smokeless tobacco: Never Used  Substance Use Topics  . Alcohol use: No  . Drug use: No    Home Medications Prior to Admission medications   Medication Sig Start Date End Date Taking? Authorizing Provider  acetaminophen (TYLENOL) 325 MG tablet Take 650 mg by mouth every 4 (four) hours as needed for mild pain or headache.     [provider]  albuterol (PROVENTIL HFA;VENTOLIN HFA) 108 (90 Base) MCG/ACT inhaler Inhale 2 puffs into the lungs every 6 (six) hours as needed for wheezing or shortness of breath.    [provider]  azelastine (ASTELIN) 0.1 % nasal spray Place 2 sprays into both nostrils 2 (two) times daily. Use in each nostril as directed Patient not taking: Reported on 11/24/2018 11/13/18   Martyn Ehrich, NP  bisacodyl (DULCOLAX) 10 MG suppository Place 1 suppository (10 mg total) rectally daily as needed for moderate constipation. 12/05/18   Donita Brooks, NP  Calcium Carbonate-Vitamin D 600-200 MG-UNIT TABS Take 1 tablet by mouth daily.    [provider]  cetirizine (ZYRTEC) 10 MG tablet Take 10 mg by mouth daily.      [provider]  Cholecalciferol (VITAMIN D) 50 MCG (2000 UT) CAPS Take 2,000 Units by mouth daily.    [provider]  feeding supplement, ENSURE ENLIVE, (ENSURE ENLIVE) LIQD Take 237 mLs by mouth 2 (two) times daily between meals. 12/05/18   Donita Brooks, NP  fluticasone (FLONASE) 50 MCG/ACT nasal spray Place 2 sprays into the nose daily as  needed for allergies or rhinitis.     [provider]  guaiFENesin (MUCINEX) 600 MG 12 hr tablet Take 1 tablet (600 mg total) by mouth 2 (two) times daily. 12/05/18   Donita Brooks, NP  ipratropium-albuterol (DUONEB) 0.5-2.5 (3) MG/3ML SOLN Take 3 mLs by nebulization 4 (four) times daily. 12/05/18   Donita Brooks, NP  levofloxacin (LEVAQUIN) 250 MG tablet Take 1 tablet (250 mg total) by mouth daily. 12/06/18   Donita Brooks, NP  metoprolol tartrate (LOPRESSOR) 25 MG tablet TAKE 1/2 (ONE-HALF) TABLET BY MOUTH TWICE DAILY Patient taking differently: 2 (two) times daily.  06/27/18  Skeet Latch, MD  Morphine Sulfate (MORPHINE CONCENTRATE) 10 MG/0.5ML SOLN concentrated solution Take 0.13 mLs (2.6 mg total) by mouth every 4 (four) hours as needed for severe pain or shortness of breath. 12/05/18   Donita Brooks, NP  Multiple Vitamins-Minerals (PRESERVISION AREDS 2) CAPS Take 1 tablet by mouth 2 (two) times a day.     [provider]  Omega-3 Fatty Acids (FISH OIL) 1000 MG CAPS Take 1,000 mg by mouth at bedtime.     [provider]  OXYGEN Inhale into the lungs. 3L at rest and 4pulse with exertion    [provider]  phenol (CHLORASEPTIC) 1.4 % LIQD Use as directed 1 spray in the mouth or throat as needed for throat irritation / pain. 12/05/18   Donita Brooks, NP  predniSONE (DELTASONE) 20 MG tablet Take 1 tablet (20 mg total) by mouth daily with breakfast. 12/06/18   Donita Brooks, NP    Allergies    Tiotropium bromide monohydrate, Codeine, Fosamax [alendronate], Asa [aspirin], and Latex  Review of Systems   Review of Systems  Constitutional: Negative for fever.  HENT: Negative for sinus pain.   Respiratory: Negative for shortness of breath.   Cardiovascular: Negative for chest pain.  Gastrointestinal: Negative for abdominal pain, nausea and vomiting.  Genitourinary: Negative for dysuria.  Musculoskeletal: Positive for arthralgias.  Skin: Negative for  pallor and wound.  Neurological: Negative for headaches.    Physical Exam Updated Vital Signs BP 121/71   Pulse 95   Temp (!) 97.5 F (36.4 C) (Oral)   Resp 15   Ht 4\' 10"  (1.473 m)   Wt 49 kg   SpO2 100%   BMI 22.57 kg/m   Physical Exam Vitals and nursing note reviewed.  Constitutional:      Appearance: Normal appearance.  HENT:     Head: Normocephalic and atraumatic.  Eyes:     Pupils: Pupils are equal, round, and reactive to light.  Cardiovascular:     Rate and Rhythm: Normal rate.  Pulmonary:     Effort: Pulmonary effort is normal.  Abdominal:     General: Abdomen is flat.  Musculoskeletal:     Cervical back: Normal range of motion.     Left upper leg: Deformity present.     Comments: Left leg appears externally rotated and shorten.   Skin:    General: Skin is warm and dry.  Neurological:     Mental Status: She is alert and oriented to person, place, and time.     ED Results / Procedures / Treatments   Labs (all labs ordered are listed, but only abnormal results are displayed) Labs Reviewed  CBC WITH DIFFERENTIAL/PLATELET - Abnormal; Notable for the following components:      Result Value   WBC 11.0 (*)    Neutro Abs 10.0 (*)    Lymphs Abs 0.6 (*)    All other components within normal limits  COMPREHENSIVE METABOLIC PANEL - Abnormal; Notable for the following components:   Glucose, Bld 156 (*)    Creatinine, Ser 0.30 (*)    Calcium 8.4 (*)    Total Protein 5.2 (*)    Albumin 3.2 (*)    AST 12 (*)    All other components within normal limits  SARS CORONAVIRUS 2 (TAT 6-24 HRS)  TYPE AND SCREEN    EKG None  Radiology DG Hip Unilat W or Wo Pelvis 2-3 Views Left  Result Date: 05/26/2019 CLINICAL DATA:  Witnessed fall, shortening  and lateral rotation of the left leg, history of osteoporosis EXAM: DG HIP (WITH OR WITHOUT PELVIS) 2-3V LEFT COMPARISON:  None FINDINGS: Comminuted intertrochanteric fracture of the left proximal femur with valgus  deformity and superior migration of the distal fracture fragment. There is some extension of the fracture into the subtrochanteric femur. No additional fracture is identified. Lateral view is limited due to overlying material but suggest posterior displacement also of the distal fracture component. IMPRESSION: Comminuted intertrochanteric fracture of the left proximal femur with valgus deformity and superior and posterior displacement of distal fracture elements with extension of the fracture into the subtrochanteric femur. Electronically Signed   By: Zetta Bills M.D.   On: 05/26/2019 15:29    Procedures Procedures (including critical care time)  Medications Ordered in ED Medications  fentaNYL (SUBLIMAZE) injection 50 mcg (50 mcg Intravenous Given 05/26/19 1530)    ED Course  I have reviewed the triage vital signs and the nursing notes.  Pertinent labs & imaging results that were available during my care of the patient were reviewed by me and considered in my medical decision making (see chart for details).    MDM Rules/Calculators/A&P     Patient with a past medical history of CHF, prior cancer, lung cancer, on hospice care presents to the ED status post mechanical fall witnessed by daughter.  Reports she was standing up from her walker when she suddenly slipped landing on her bottom, did not hit her head, no loss of consciousness.  Currently not on any blood thinners.  She was given 100 mics of fentanyl by EMS, reports pain is somewhat improved.  On arrival patient's left hip looks externally rotated, shortened.  She does have pain with lifting of the left leg.  Pulses are present, otherwise neurovascularly intact.  Blood work was ordered for preop reasons.  CBC with slight leukocytosis, hemoglobin is stable, no signs of anemia.  CMP without any electrolyte normality, creatinine level slightly decreased, LFTs were at her baseline.  4:02 PM Spoke to daughter [name], reports patient was  sitting on her walker today and slid off onto the floor, landing on her bottom. She is currently in home hospice care. Daughter states patient has severe COPD, CHF and does not know if she will due well with surgery. She will "not be able to be put to sleep".  Also spoke to son, Troy Sine who reports patient will not be able to tolerate surgery as "lungs are weak".  Son reports he would like to discuss procedure with mother prior to making any decisions.  Advised that I will call him back in order to follow-up.   Xray of the left hip showed: Comminuted intertrochanteric fracture of the left proximal femur  with valgus deformity and superior and posterior displacement of  distal fracture elements with extension of the fracture into the  subtrochanteric femur.   4:34 PM call placed for Dr. Griffin Basil orthopedics for further recommendations.  5:55 PM Spoke to Dr. Griffin Basil who will see patient if she is clear by pulmonology.   5:56 PM Spoke to [name] daughter, who is notified of patient likely transferring to [facility].  6:52 PM spoke to Dr. Daryll Drown, [facility] hospitalist who will admit patient along with further transfer to [facility].  Portions of this note were generated with [employer]. Dictation errors may occur despite best attempts at proofreading.  Final Clinical Impression(s) / ED Diagnoses Final diagnoses:  Closed fracture of left hip, initial encounter [facility])  Fall, initial encounter  Rx / DC Orders ED Discharge Orders    None       Janeece Fitting, PA-C 05/26/19 1853    Charlesetta Shanks, MD 05/27/19 1221

## 2019-05-26 NOTE — ED Notes (Signed)
[facility] notified for transport to [facility].

## 2019-05-26 NOTE — H&P (Signed)
History and Physical    Heather Barnes DGL:[id number] DOB: May 09, 1934 DOA: 05/26/2019  PCP: Orpah Melter, MD  Patient coming from: Home  Chief Complaint: Hip pain  HPI: Heather Barnes is a 85 y.o. female with medical history significant of PVCs, osteoporosis, lung cancer (completed treatment), breast cancer (completed treatment), CAD, arthritis, COPD on 3L - on home hospice for this diagnosis.  She presented after a mechanical fall using her walker.  She notes that she has been in bed for the past 3 weeks due to back spasms and not being able to walk.  She had been feeling weak and has only recently been back up and moving.  Unfortunately, she did fall.  She has been declining this year, starting with a prolonged hospitalization in June.  She went to inpatient hospice for 3 weeks after that time and then has been on home hospice.  She takes morphine for air hunger, but has only taken it a few times.  She is DNR.  Her pain is relatively well controlled at this time if she does not move.  Denies chest pain, fever, chills, vomiting, rash, wounds, dizziness, lightheadedness, blacking out.    ED Course: In the ED, she had an Xray of her hip which showed a fracture and orthopedics was called.  She had a dose of fentanyl which did help her pain.    Review of Systems: As per HPI otherwise 10 point review of systems negative.    Past Medical History:  Diagnosis Date  . Arthritis    "joints" (04/11/2017)  . Asthma   . Breast cancer, right breast ([place])    S/P RIGHT BREAST LUMPECTOMY WITH RADIOACTIVE SEED LOCALIZATION 04/11/2017  . Chronic lower back pain    "if I stand too long" (04/11/2017)  . Colon adenomas   . Complication of anesthesia    Must sit upright 45-60 degree angle per pulmonology  . COPD (chronic obstructive pulmonary disease) ([place])    "severe" (04/11/2017)  . Coronary artery calcification 09/01/2016  . Dysrhythmia    bigeminy  . GERD (gastroesophageal reflux disease)   .  Goiter   . History of duodenal ulcer   . History of radiation therapy 05/24/11-06/02/11   R middle lobe lung/ 60 gray  . Hx of colonic polyps   . Hyperlipidemia   . Lung cancer, middle lobe ([place]) 04/26/2011   S/P radiation 05/24/11-06/02/11  . On home oxygen therapy    "3L; all the time" (04/11/2017)  . Osteoporosis   . Osteoporosis   . Palpitations 01/19/2016   a. event monitor in 01/2016 showed PVC's with one episode of ventricular quadrigeminy.  . Pneumonia 2017 X 2  . Pollen allergy   . PVC (premature ventricular contraction) 02/24/2016  . Shortness of breath   . Wears glasses     Past Surgical History:  Procedure Laterality Date  . APPENDECTOMY    . BIOPSY THYROID  2016  . BREAST BIOPSY Right 03/2017  . BREAST LUMPECTOMY WITH RADIOACTIVE SEED LOCALIZATION Right 04/11/2017  . BREAST LUMPECTOMY WITH RADIOACTIVE SEED LOCALIZATION Right 04/11/2017   Procedure: RIGHT BREAST LUMPECTOMY WITH RADIOACTIVE SEED LOCALIZATION;  Surgeon: Rolm Bookbinder, MD;  Location: [facility];  Service: General;  Laterality: Right;  . CATARACT EXTRACTION W/ INTRAOCULAR LENS  IMPLANT, BILATERAL Bilateral   . COLONOSCOPY W/ BIOPSIES    . DILATION AND CURETTAGE OF UTERUS  "many"  . TONSILLECTOMY    . TOTAL ABDOMINAL HYSTERECTOMY     Reviewed with patient.  reports that she quit smoking about 20 years ago. Her smoking use included cigarettes. She has a 20.00 pack-year smoking history. She has never used smokeless tobacco. She reports that she does not drink alcohol or use drugs.  Allergies  Allergen Reactions  . Tiotropium Bromide Monohydrate Hives, Shortness Of Breath and Rash    Spiriva   . Codeine Other (See Comments)    Hyperactivity,crying  . Fosamax [Alendronate] Other (See Comments)    Muscle aches  . Asa [Aspirin] Other (See Comments)    Bleeding -ulcers  . Latex Rash  She is able to tolerate ipratropium and morphine.     Reviewed with patient.  Family History  Problem Relation Age  of Onset  . Emphysema Maternal Uncle   . Heart disease Mother   . Heart disease Maternal Grandfather   . Rheum arthritis Sister   . Cancer Sister   . Cancer Sister        throat cancer  . Brain cancer Sister   . Cancer Sister   . Ovarian cancer Sister   . Breast cancer Daughter     Prior to Admission medications   Medication Sig Start Date End Date Taking? Authorizing Provider  acetaminophen (TYLENOL) 325 MG tablet Take 650 mg by mouth every 4 (four) hours as needed for mild pain or headache.     [provider]  albuterol (PROVENTIL HFA;VENTOLIN HFA) 108 (90 Base) MCG/ACT inhaler Inhale 2 puffs into the lungs every 6 (six) hours as needed for wheezing or shortness of breath.    [provider]  azelastine (ASTELIN) 0.1 % nasal spray Place 2 sprays into both nostrils 2 (two) times daily. Use in each nostril as directed Patient not taking: Reported on 11/24/2018 11/13/18   Martyn Ehrich, NP  bisacodyl (DULCOLAX) 10 MG suppository Place 1 suppository (10 mg total) rectally daily as needed for moderate constipation. 12/05/18   Donita Brooks, NP  Calcium Carbonate-Vitamin D 600-200 MG-UNIT TABS Take 1 tablet by mouth daily.    [provider]  cetirizine (ZYRTEC) 10 MG tablet Take 10 mg by mouth daily.      [provider]  Cholecalciferol (VITAMIN D) 50 MCG (2000 UT) CAPS Take 2,000 Units by mouth daily.    [provider]  feeding supplement, ENSURE ENLIVE, (ENSURE ENLIVE) LIQD Take 237 mLs by mouth 2 (two) times daily between meals. 12/05/18   Donita Brooks, NP  fluticasone (FLONASE) 50 MCG/ACT nasal spray Place 2 sprays into the nose daily as needed for allergies or rhinitis.     [provider]  guaiFENesin (MUCINEX) 600 MG 12 hr tablet Take 1 tablet (600 mg total) by mouth 2 (two) times daily. 12/05/18   Donita Brooks, NP  ipratropium-albuterol (DUONEB) 0.5-2.5 (3) MG/3ML SOLN Take 3 mLs by nebulization 4 (four) times daily.  12/05/18   Donita Brooks, NP  levofloxacin (LEVAQUIN) 250 MG tablet Take 1 tablet (250 mg total) by mouth daily. 12/06/18   Donita Brooks, NP  metoprolol tartrate (LOPRESSOR) 25 MG tablet TAKE 1/2 (ONE-HALF) TABLET BY MOUTH TWICE DAILY Patient taking differently: 2 (two) times daily.  06/27/18   Skeet Latch, MD  Morphine Sulfate (MORPHINE CONCENTRATE) 10 MG/0.5ML SOLN concentrated solution Take 0.13 mLs (2.6 mg total) by mouth every 4 (four) hours as needed for severe pain or shortness of breath. 12/05/18   Donita Brooks, NP  Multiple Vitamins-Minerals (PRESERVISION AREDS 2) CAPS Take 1 tablet by mouth 2 (two) times  a day.     [provider]  Omega-3 Fatty Acids (FISH OIL) 1000 MG CAPS Take 1,000 mg by mouth at bedtime.     [provider]  OXYGEN Inhale into the lungs. 3L at rest and 4pulse with exertion    [provider]  phenol (CHLORASEPTIC) 1.4 % LIQD Use as directed 1 spray in the mouth or throat as needed for throat irritation / pain. 12/05/18   Donita Brooks, NP  predniSONE (DELTASONE) 20 MG tablet Take 1 tablet (20 mg total) by mouth daily with breakfast. 12/06/18   Donita Brooks, NP    Physical Exam: Constitutional: Thin, elderly woman, laying in bed, hard of hearing.  Vitals:   05/26/19 1930 05/26/19 2000 05/26/19 2030 05/26/19 2100  BP: (!) 109/54 121/64 103/64 (!) 122/58  Pulse: 97 97 100 99  Resp: 17 13 15 16   Temp:      TempSrc:      SpO2: 99% 98% 98% 100%  Weight:      Height:       Eyes: lids normal and conjunctivae with darkened spots Neck: normal, supple Respiratory: Clear, using [place] oxygen, no distress, no wheezing Cardiovascular: RR, occasional extra beat, no murmur Abdomen: +BS, ND, NT Musculoskeletal: Muscle tone is very low, she has an externally rotated and shortened left leg.  Skin: She has thin skin, varicose veins noted throughout Neurologic: Grossly intact, able to move toes, unable to move left leg due to pain.    Psychiatric: Normal judgment and insight. Alert and oriented x 3. Normal mood.    Labs on Admission: I have personally reviewed following labs and imaging studies  CBC: Recent Labs  Lab 05/26/19 1520  WBC 11.0*  NEUTROABS 10.0*  HGB 12.0  HCT 39.3  MCV 89.7  PLT 884   Basic Metabolic Panel: Recent Labs  Lab 05/26/19 1520  NA 138  K 4.0  CL 101  CO2 31  GLUCOSE 156*  BUN 12  CREATININE 0.30*  CALCIUM 8.4*   GFR: Estimated Creatinine Clearance: 34.4 mL/min (A) (by C-G formula based on SCr of 0.3 mg/dL (L)). Liver Function Tests: Recent Labs  Lab 05/26/19 1520  AST 12*  ALT 17  ALKPHOS 99  BILITOT 0.5  PROT 5.2*  ALBUMIN 3.2*   No results for input(s): LIPASE, AMYLASE in the last 168 hours. No results for input(s): AMMONIA in the last 168 hours. Coagulation Profile: No results for input(s): INR, PROTIME in the last 168 hours. Cardiac Enzymes: No results for input(s): CKTOTAL, CKMB, CKMBINDEX, TROPONINI in the last 168 hours. BNP (last 3 results) No results for input(s): PROBNP in the last 8760 hours. HbA1C: No results for input(s): HGBA1C in the last 72 hours. CBG: No results for input(s): GLUCAP in the last 168 hours. Lipid Profile: No results for input(s): CHOL, HDL, LDLCALC, TRIG, CHOLHDL, LDLDIRECT in the last 72 hours. Thyroid Function Tests: No results for input(s): TSH, T4TOTAL, FREET4, T3FREE, THYROIDAB in the last 72 hours. Anemia Panel: No results for input(s): VITAMINB12, FOLATE, FERRITIN, TIBC, IRON, RETICCTPCT in the last 72 hours.  Radiological Exams on Admission: DG Hip Unilat W or Wo Pelvis 2-3 Views Left  Result Date: 05/26/2019 CLINICAL DATA:  Witnessed fall, shortening and lateral rotation of the left leg, history of osteoporosis EXAM: DG HIP (WITH OR WITHOUT PELVIS) 2-3V LEFT COMPARISON:  None FINDINGS: Comminuted intertrochanteric fracture of the left proximal femur with valgus deformity and superior migration of the distal fracture  fragment. There is  some extension of the fracture into the subtrochanteric femur. No additional fracture is identified. Lateral view is limited due to overlying material but suggest posterior displacement also of the distal fracture component. IMPRESSION: Comminuted intertrochanteric fracture of the left proximal femur with valgus deformity and superior and posterior displacement of distal fracture elements with extension of the fracture into the subtrochanteric femur. Electronically Signed   By: Zetta Bills M.D.   On: 05/26/2019 15:29      Assessment/Plan Hip fracture  Osteoporosis - Patient was assessed by orthopedic surgery - She is high risk for surgery due to severe COPD - Family requested review by Pulmonary team in the AM to see if she would be able to undergo surgery.  She is on chronic steroids as well.  - Dr. Griffin Basil from Orthopedics requested she be sent to [facility] for further evaluation and possible surgery given her co-morbidities - Pain control with morphine IV and tylenol - NPO at midnight  COPD (chronic obstructive pulmonary disease)  Chronic respiratory failure with hypoxia  - On baseline of 3 L.  On home hospice - Continue oxygen - Continue Duoneb PRN - Morphine soln for air hunger  Leukocytosis - Chronic, likely related to chronic steroids    Coronary artery calcification - Continue beta blocker  GERD - Monitor for symptoms, she is not currently on therapy  H/O Lung cancer and breast cancer - No acute issue  DVT prophylaxis: Heparin SQ, hold prior to surgery  Code Status: DNR  Family Communication: Spoke with Daughter [name] and son [name] and confirmed DNR and updated them on prognosis and transition to [facility]  Disposition Plan: Admit for possible surgery  Consults called: Orthopedics, Dr. Griffin Basil.  Will need Pulmonary consult in the AM.  Pulmonologist is Dr. Chase Caller Admission status: Med Surg, IP.    Gilles Chiquito MD [facility]   If 7PM-7AM,  please contact night-coverage [email or web address] Password [email or web address]  05/26/2019, 10:33 PM

## 2019-05-26 NOTE — ED Provider Notes (Signed)
Medical screening examination/treatment/procedure(s) were conducted as a shared visit with non-physician practitioner(s) and myself.  I personally evaluated the patient during the encounter.    Patient reports that she went to stand up and her foot just slipped out from under her on the left.  This caused her to fall and land on her bottom.  After that she had severe pain in her left hip and was unable to bear weight.  She denies any other areas of significant pain.  Patient has complex medical history.  Patient is alert.  She is interactive.  She is hard of hearing but understands with loud voice.  Patient describes an incident leading up to her fall.  She reports the only area of pain is her hip.  Normocephalic/atraumatic.  No respiratory distress.  Abdomen is soft and nontender.  Calves are soft nontender.  Feet are warm and dry.  Agree with plan of management.   Charlesetta Shanks, MD 05/26/19 [phone]

## 2019-05-26 NOTE — Plan of Care (Signed)
  Problem: Education: Goal: Knowledge of General Education information will improve Description: Including pain rating scale, medication(s)/side effects and non-pharmacologic comfort measures Outcome: Progressing   Problem: Clinical Measurements: Goal: Respiratory complications will improve Outcome: Progressing   Problem: Activity: Goal: Risk for activity intolerance will decrease Outcome: Progressing   Problem: Coping: Goal: Level of anxiety will decrease Outcome: Progressing   Problem: Pain Managment: Goal: General experience of comfort will improve Outcome: Progressing   Problem: Safety: Goal: Ability to remain free from injury will improve Outcome: Progressing   Problem: Skin Integrity: Goal: Risk for impaired skin integrity will decrease Outcome: Progressing

## 2019-05-26 NOTE — Progress Notes (Signed)
Orthopedics consulted for hip fracture. Family apparently is not sure of their goals of care. We [facility] as ability to handle a fracture if delayed care is requested is much more available at [facility] rather than [facility]. If requires cancer care at [facility] can stay there but it may delay surgery further.   We will post and attempt to provide care based on family goals of care decision making. Keepin g patient NPO would allow for options to remain open in [place] to surgery tomorrow.  We will not post for now.  Orthopedics team will meet patient tomorrow.

## 2019-05-26 NOTE — ED Triage Notes (Signed)
Arrives via EMS from home, had a witnessed fall, denies hitting head or LOC, EMS noticed shortening and rotationing on L leg. EMS gave 100 mcg of Fentanyl, 20 G LAC.

## 2019-05-27 DIAGNOSIS — J9611 Chronic respiratory failure with hypoxia: Secondary | ICD-10-CM

## 2019-05-27 DIAGNOSIS — S72002A Fracture of unspecified part of neck of left femur, initial encounter for closed fracture: Secondary | ICD-10-CM

## 2019-05-27 DIAGNOSIS — Z515 Encounter for palliative care: Secondary | ICD-10-CM

## 2019-05-27 DIAGNOSIS — Z01811 Encounter for preprocedural respiratory examination: Secondary | ICD-10-CM

## 2019-05-27 DIAGNOSIS — J441 Chronic obstructive pulmonary disease with (acute) exacerbation: Secondary | ICD-10-CM

## 2019-05-27 LAB — BLOOD GAS, ARTERIAL
Acid-Base Excess: 5.6 mmol/L — ABNORMAL HIGH (ref 0.0–2.0)
Allens test (pass/fail): POSITIVE — AB
Bicarbonate: 30 mmol/L — ABNORMAL HIGH (ref 20.0–28.0)
Drawn by: 270221
FIO2: 28
O2 Saturation: 95.4 %
Patient temperature: 36.4
pCO2 arterial: 45.4 mmHg (ref 32.0–48.0)
pH, Arterial: 7.432 (ref 7.350–7.450)
pO2, Arterial: 71.5 mmHg — ABNORMAL LOW (ref 83.0–108.0)

## 2019-05-27 LAB — MRSA PCR SCREENING: MRSA by PCR: NEGATIVE

## 2019-05-27 LAB — CBC
HCT: 35.3 % — ABNORMAL LOW (ref 36.0–46.0)
Hemoglobin: 11.1 g/dL — ABNORMAL LOW (ref 12.0–15.0)
MCH: 27.1 pg (ref 26.0–34.0)
MCHC: 31.4 g/dL (ref 30.0–36.0)
MCV: 86.3 fL (ref 80.0–100.0)
Platelets: 188 10*3/uL (ref 150–400)
RBC: 4.09 MIL/uL (ref 3.87–5.11)
RDW: 15.4 % (ref 11.5–15.5)
WBC: 12.6 10*3/uL — ABNORMAL HIGH (ref 4.0–10.5)
nRBC: 0 % (ref 0.0–0.2)

## 2019-05-27 LAB — BASIC METABOLIC PANEL
Anion gap: 8 (ref 5–15)
BUN: 8 mg/dL (ref 8–23)
CO2: 31 mmol/L (ref 22–32)
Calcium: 9 mg/dL (ref 8.9–10.3)
Chloride: 96 mmol/L — ABNORMAL LOW (ref 98–111)
Creatinine, Ser: 0.42 mg/dL — ABNORMAL LOW (ref 0.44–1.00)
GFR calc Af Amer: >60 mL/min (ref >60)
GFR calc non Af Amer: >60 mL/min (ref >60)
Glucose, Bld: 109 mg/dL — ABNORMAL HIGH (ref 70–99)
Potassium: 4.1 mmol/L (ref 3.5–5.1)
Sodium: 135 mmol/L (ref 135–145)

## 2019-05-27 LAB — SARS CORONAVIRUS 2 (TAT 6-24 HRS): SARS Coronavirus 2: NEGATIVE

## 2019-05-27 MED ORDER — IPRATROPIUM-ALBUTEROL 0.5-2.5 (3) MG/3ML IN SOLN
3.0000 mL | Freq: Three times a day (TID) | RESPIRATORY_TRACT | Status: DC
  Administered 2019-05-27 – 2019-05-30 (×7): 3 mL via RESPIRATORY_TRACT
  Filled 2019-05-27 (×8): qty 3

## 2019-05-27 NOTE — Consult Note (Signed)
Palliative Medicine  Name: Heather Barnes Date: 05/27/2019 MRN: [id number]  DOB: 04-Jan-1934  Patient Care Team: Orpah Melter, MD as PCP - General (Internal Medicine) Brand Males, MD as Consulting Physician (Pulmonary Disease) Rolm Bookbinder, MD as Consulting Physician (General Surgery) Magrinat, Virgie Dad, MD as Consulting Physician (Oncology) Gery Pray, MD as Consulting Physician (Radiation Oncology) Gerda Diss, DO as Consulting Physician (Family Medicine)    REASON FOR CONSULTATION: Heather Barnes is a 85 y.o. female with multiple medical problems including O2 dependent COPD on 3 L at baseline, CAD, OA, osteoporosis, history of lung cancer and breast cancer, who was hospitalized in June 2020 for severe COPD exacerbation requiring short-term intubation.  Patient was subsequently discharged to residential hospice but ultimately improved and is now followed at home with hospice.  She is now admitted on 05/26/2019 with hip pain after mechanical fall.  Patient was subsequently found to have a left intertrochanteric fracture.  Palliative care was consulted to help address goals.  SOCIAL HISTORY:     reports that she quit smoking about 20 years ago. Her smoking use included cigarettes. She has a 20.00 pack-year smoking history. She has never used smokeless tobacco. She reports that she does not drink alcohol or use drugs.   Patient is widowed.  She lives at home with her daughter, who is disabled.  She has a son who is also involved in her care.  Patient is followed at home by hospice.  ADVANCE DIRECTIVES:  Not on file  CODE STATUS: DNR  PAST MEDICAL HISTORY: Past Medical History:  Diagnosis Date  . Arthritis    "joints" (04/11/2017)  . Asthma   . Breast cancer, right breast ([place])    S/P RIGHT BREAST LUMPECTOMY WITH RADIOACTIVE SEED LOCALIZATION 04/11/2017  . Chronic lower back pain    "if I stand too long" (04/11/2017)  . Colon adenomas   .  Complication of anesthesia    Must sit upright 45-60 degree angle per pulmonology  . COPD (chronic obstructive pulmonary disease) ([place])    "severe" (04/11/2017)  . Coronary artery calcification 09/01/2016  . Dysrhythmia    bigeminy  . GERD (gastroesophageal reflux disease)   . Goiter   . History of duodenal ulcer   . History of radiation therapy 05/24/11-06/02/11   R middle lobe lung/ 60 gray  . Hx of colonic polyps   . Hyperlipidemia   . Lung cancer, middle lobe ([place]) 04/26/2011   S/P radiation 05/24/11-06/02/11  . On home oxygen therapy    "3L; all the time" (04/11/2017)  . Osteoporosis   . Osteoporosis   . Palpitations 01/19/2016   a. event monitor in 01/2016 showed PVC's with one episode of ventricular quadrigeminy.  . Pneumonia 2017 X 2  . Pollen allergy   . PVC (premature ventricular contraction) 02/24/2016  . Shortness of breath   . Wears glasses     PAST SURGICAL HISTORY:  Past Surgical History:  Procedure Laterality Date  . APPENDECTOMY    . BIOPSY THYROID  2016  . BREAST BIOPSY Right 03/2017  . BREAST LUMPECTOMY WITH RADIOACTIVE SEED LOCALIZATION Right 04/11/2017  . BREAST LUMPECTOMY WITH RADIOACTIVE SEED LOCALIZATION Right 04/11/2017   Procedure: RIGHT BREAST LUMPECTOMY WITH RADIOACTIVE SEED LOCALIZATION;  Surgeon: Rolm Bookbinder, MD;  Location: [facility];  Service: General;  Laterality: Right;  . CATARACT EXTRACTION W/ INTRAOCULAR LENS  IMPLANT, BILATERAL Bilateral   . COLONOSCOPY W/ BIOPSIES    . DILATION AND CURETTAGE OF UTERUS  "many"  .  TONSILLECTOMY    . TOTAL ABDOMINAL HYSTERECTOMY      HEMATOLOGY/ONCOLOGY HISTORY:  Oncology History   No history exists.    ALLERGIES:  is allergic to tiotropium bromide monohydrate; codeine; fosamax [alendronate]; asa [aspirin]; and latex.  MEDICATIONS:  Current Facility-Administered Medications  Medication Dose Route Frequency Provider Last Rate Last Admin  . acetaminophen (TYLENOL) tablet 650 mg  650 mg Oral Q4H PRN  Sid Falcon, MD   650 mg at 05/27/19 1021  . albuterol (PROVENTIL) (2.5 MG/3ML) 0.083% nebulizer solution 2.5 mg  2.5 mg Nebulization Q6H PRN Einar Grad, RPH      . calcium-vitamin D (OSCAL WITH D) 500-200 MG-UNIT per tablet 1 tablet  1 tablet Oral Daily Gilles Chiquito B, MD      . cholecalciferol (VITAMIN D3) tablet 2,000 Units  2,000 Units Oral Daily Gilles Chiquito B, MD      . feeding supplement (ENSURE ENLIVE) (ENSURE ENLIVE) liquid 237 mL  237 mL Oral BID BM Gilles Chiquito B, MD      . heparin injection 5,000 Units  5,000 Units Subcutaneous Q8H Sid Falcon, MD   Stopped at 05/27/19 [phone]  . ipratropium-albuterol (DUONEB) 0.5-2.5 (3) MG/3ML nebulizer solution 3 mL  3 mL Nebulization TID Debbe Odea, MD      . metoprolol tartrate (LOPRESSOR) tablet 12.5 mg  12.5 mg Oral BID Gilles Chiquito B, MD   12.5 mg at 05/27/19 1021  . morphine CONCENTRATE 10 MG/0.5ML oral solution 2.6 mg  2.6 mg Oral Q4H PRN Gilles Chiquito B, MD      . ondansetron [facility]) tablet 4 mg  4 mg Oral Q6H PRN Sid Falcon, MD       Or  . ondansetron [facility]) injection 4 mg  4 mg Intravenous Q6H PRN Gilles Chiquito B, MD      . predniSONE (DELTASONE) tablet 15 mg  15 mg Oral Q breakfast Gilles Chiquito B, MD   15 mg at 05/27/19 1022  . senna-docusate (Senokot-S) tablet 1 tablet  1 tablet Oral QHS PRN Sid Falcon, MD        VITAL SIGNS: BP (!) 111/55 (BP Location: Left Arm)   Pulse 93   Temp 98.7 F (37.1 C) (Oral)   Resp 14   Ht _0  (1.473 m)   Wt 104 lb 4.4 oz (47.3 kg)   SpO2 97%   BMI 21.79 kg/m  Filed Weights   05/26/19 1433 05/26/19 2300  Weight: 108 lb (49 kg) 104 lb 4.4 oz (47.3 kg)    Estimated body mass index is 21.79 kg/m as calculated from the following:   Height as of this encounter: _1  (1.473 m).   Weight as of this encounter: 104 lb 4.4 oz (47.3 kg).  LABS: CBC:    Component Value Date/Time   WBC 12.6 (H) 05/27/2019 0734   HGB 11.1 (L) 05/27/2019 0734   HGB 12.8  03/16/2017 0823   HCT 35.3 (L) 05/27/2019 0734   HCT 39.9 03/16/2017 0823   PLT 188 05/27/2019 0734   PLT 182 03/16/2017 0823   MCV 86.3 05/27/2019 0734   MCV 87.6 03/16/2017 0823   NEUTROABS 10.0 (H) 05/26/2019 1520   NEUTROABS 3.4 03/16/2017 0823   LYMPHSABS 0.6 (L) 05/26/2019 1520   LYMPHSABS 1.1 03/16/2017 0823   MONOABS 0.3 05/26/2019 1520   MONOABS 0.6 03/16/2017 0823   EOSABS 0.0 05/26/2019 1520   EOSABS 0.1 03/16/2017 0823   BASOSABS 0.0 05/26/2019 1520   BASOSABS  0.0 03/16/2017 0823   Comprehensive Metabolic Panel:    Component Value Date/Time   NA 135 05/27/2019 0734   NA 143 03/16/2017 0823   K 4.1 05/27/2019 0734   K 4.6 03/16/2017 0823   CL 96 (L) 05/27/2019 0734   CO2 31 05/27/2019 0734   CO2 35 (H) 03/16/2017 0823   BUN 8 05/27/2019 0734   BUN 9.9 03/16/2017 0823   CREATININE 0.42 (L) 05/27/2019 0734   CREATININE 0.7 03/16/2017 0823   GLUCOSE 109 (H) 05/27/2019 0734   GLUCOSE 91 03/16/2017 0823   CALCIUM 9.0 05/27/2019 0734   CALCIUM 10.0 03/16/2017 0823   AST 12 (L) 05/26/2019 1520   AST 19 03/16/2017 0823   ALT 17 05/26/2019 1520   ALT 19 03/16/2017 0823   ALKPHOS 99 05/26/2019 1520   ALKPHOS 53 03/16/2017 0823   BILITOT 0.5 05/26/2019 1520   BILITOT 0.55 03/16/2017 0823   PROT 5.2 (L) 05/26/2019 1520   PROT 6.9 03/16/2017 0823   ALBUMIN 3.2 (L) 05/26/2019 1520   ALBUMIN 3.9 03/16/2017 0823    RADIOGRAPHIC STUDIES: DG Hip Unilat W or Wo Pelvis 2-3 Views Left  Result Date: 05/26/2019 CLINICAL DATA:  Witnessed fall, shortening and lateral rotation of the left leg, history of osteoporosis EXAM: DG HIP (WITH OR WITHOUT PELVIS) 2-3V LEFT COMPARISON:  None FINDINGS: Comminuted intertrochanteric fracture of the left proximal femur with valgus deformity and superior migration of the distal fracture fragment. There is some extension of the fracture into the subtrochanteric femur. No additional fracture is identified. Lateral view is limited due to  overlying material but suggest posterior displacement also of the distal fracture component. IMPRESSION: Comminuted intertrochanteric fracture of the left proximal femur with valgus deformity and superior and posterior displacement of distal fracture elements with extension of the fracture into the subtrochanteric femur. Electronically Signed   By: Zetta Bills M.D.   On: 05/26/2019 15:29    PERFORMANCE STATUS (ECOG) : 3 - Symptomatic, >50% confined to bed  Review of Systems Unless otherwise noted, a complete review of systems is negative.  Physical Exam General: NAD, frail appearing, thin Pulmonary: Unlabored Extremities: no edema, no joint deformities Skin: no rashes Neurological: Weakness but otherwise nonfocal  IMPRESSION: I met with patient and discuss goals.  Introduced palliative care services and attempt to establish therapeutic rapport.  Patient is comfortable appearing.  She currently denies any pain or other distressing symptoms as long as she stays still in the bed.  Patient seems to have a reasonable understanding of her current medical problems and the risks associated with either surgical or conservative treatment.  She has been evaluated by Ortho and is pending evaluation by pulmonary.  Patient says she is undecided and wants to give further consideration prior to any decision making.  At baseline, patient says that she is mostly sedentary either in chair or bed.  She says that she had a prolonged period recently where she was almost entirely in the bed and required a indwelling Foley catheter.  Patient says that if she ultimately has to be bedbound due to the fracture that would be acceptable.  She defines a high quality of life as being able to see her grandchildren, and read and write.  Patient says that she has been extremely happy with hospice care and would like for them to continue following her after this hospitalization.  Patient confirms DNR/DNI.  She says that  she felt emotionally traumatized following intubation in June.  She says she  would not wish to go through that again.  PLAN: -Continue current scope of treatment -DNR/DNI -We will benefit from hospice involvement following discharge from the hospital -Will follow   Time Total: 60 minutes  Visit consisted of counseling and education dealing with the complex and emotionally intense issues of symptom management and palliative care in the setting of serious and potentially life-threatening illness.Greater than 50%  of this time was spent counseling and coordinating care related to the above assessment and plan.  Signed by: Altha Harm, PhD, NP-C

## 2019-05-27 NOTE — Progress Notes (Signed)
PROGRESS NOTE    Heather Barnes   OIN:[id number]  DOB: 1934/04/07  DOA: 05/26/2019 PCP: Orpah Melter, MD   Brief Narrative:  Heather Barnes 85 y.o. female with medical history significant of PVCs, osteoporosis, lung cancer (completed treatment), breast cancer (completed treatment), CAD, arthritis, COPD on 3L - on home hospice for this diagnosis.  She presented after a mechanical fall using her walker.  She notes that she has been in bed for the past 3 weeks due to back spasms and not being able to walk.  She had been feeling weak and has only recently been back up and moving.   In the ED the patient was found to have a comminuted intertrochanteric fracture of the left proximal femur.  Subjective: The patient has no complaints at this time.    Assessment & Plan:   Principal Problem:   Closed left hip fracture  -As this patient is on hospice at home, orthopedic surgery is awaiting further discussions ensure that surgery is what the patient and the family want   Active Problems:   COPD (chronic obstructive pulmonary disease) /chronic respiratory failure with hypoxia -Stable at this time on 3 L of oxygen -She admits to mild chronic cough but no increasing shortness of breath or wheezing -Continue daily prednisone, DuoNebs and as needed morphine which she uses for dyspnea  Hypertension -Continue metoprolol  Time spent in minutes: 30 minutes DVT prophylaxis: Heparin Code Status: DO NOT RESUSCITATE Family Communication:  Disposition Plan: Awaiting further decision on surgery Consultants:   Orthopedic surgery Procedures:    Antimicrobials:  Anti-infectives (From admission, onward)   None       Objective: Vitals:   05/26/19 2300 05/27/19 0326 05/27/19 0806 05/27/19 0846  BP:  (!) 98/52 (!) 111/55   Pulse:  89 93   Resp:   14   Temp:  98.6 F (37 C) 98.7 F (37.1 C)   TempSrc:  Oral Oral   SpO2:  96% 97% 97%  Weight: 47.3 kg     Height: 4\' 10"  (1.473  m)       Intake/Output Summary (Last 24 hours) at 05/27/2019 1003 Last data filed at 05/26/2019 2300 Gross per 24 hour  Intake 240 ml  Output --  Net 240 ml   Filed Weights   05/26/19 1433 05/26/19 2300  Weight: 49 kg 47.3 kg    Examination: General exam: Appears comfortable  HEENT: PERRLA, oral mucosa moist, no sclera icterus or thrush Respiratory system: Clear to auscultation. Respiratory effort normal. Cardiovascular system: S1 & S2 heard, RRR.   Gastrointestinal system: Abdomen soft, non-tender, nondistended. Normal bowel sounds. Central nervous system: Alert and oriented. No focal neurological deficits. Extremities: No cyanosis, clubbing -mild edema in left hip with shortening of left leg Skin: No rashes or ulcers Psychiatry:  Mood & affect appropriate.     Data Reviewed: I have personally reviewed following labs and imaging studies  CBC: Recent Labs  Lab 05/26/19 1520 05/27/19 0734  WBC 11.0* 12.6*  NEUTROABS 10.0*  --   HGB 12.0 11.1*  HCT 39.3 35.3*  MCV 89.7 86.3  PLT 181 709   Basic Metabolic Panel: Recent Labs  Lab 05/26/19 1520 05/27/19 0734  NA 138 135  K 4.0 4.1  CL 101 96*  CO2 31 31  GLUCOSE 156* 109*  BUN 12 8  CREATININE 0.30* 0.42*  CALCIUM 8.4* 9.0   GFR: Estimated Creatinine Clearance: 34.4 mL/min (A) (by C-G formula based on SCr of 0.42 mg/dL (  L)). Liver Function Tests: Recent Labs  Lab 05/26/19 1520  AST 12*  ALT 17  ALKPHOS 99  BILITOT 0.5  PROT 5.2*  ALBUMIN 3.2*   No results for input(s): LIPASE, AMYLASE in the last 168 hours. No results for input(s): AMMONIA in the last 168 hours. Coagulation Profile: No results for input(s): INR, PROTIME in the last 168 hours. Cardiac Enzymes: No results for input(s): CKTOTAL, CKMB, CKMBINDEX, TROPONINI in the last 168 hours. BNP (last 3 results) No results for input(s): PROBNP in the last 8760 hours. HbA1C: No results for input(s): HGBA1C in the last 72 hours. CBG: No results  for input(s): GLUCAP in the last 168 hours. Lipid Profile: No results for input(s): CHOL, HDL, LDLCALC, TRIG, CHOLHDL, LDLDIRECT in the last 72 hours. Thyroid Function Tests: No results for input(s): TSH, T4TOTAL, FREET4, T3FREE, THYROIDAB in the last 72 hours. Anemia Panel: No results for input(s): VITAMINB12, FOLATE, FERRITIN, TIBC, IRON, RETICCTPCT in the last 72 hours. Urine analysis:    Component Value Date/Time   COLORURINE YELLOW 06/11/2015 1308   APPEARANCEUR CLOUDY (A) 06/11/2015 1308   LABSPEC 1.018 06/11/2015 1308   PHURINE 6.0 06/11/2015 1308   GLUCOSEU >1000 (A) 06/11/2015 1308   HGBUR TRACE (A) 06/11/2015 1308   BILIRUBINUR NEGATIVE 06/11/2015 1308   KETONESUR NEGATIVE 06/11/2015 1308   PROTEINUR NEGATIVE 06/11/2015 1308   NITRITE NEGATIVE 06/11/2015 1308   LEUKOCYTESUR SMALL (A) 06/11/2015 1308   Sepsis Labs: @LABRCNTIP (procalcitonin:4,lacticidven:4) ) Recent Results (from the past 240 hour(s))  SARS CORONAVIRUS 2 (TAT 6-24 HRS) Nasopharyngeal Nasopharyngeal Swab     Status: None   Collection Time: 05/26/19  6:09 PM   Specimen: Nasopharyngeal Swab  Result Value Ref Range Status   SARS Coronavirus 2 NEGATIVE NEGATIVE Final    Comment: (NOTE) SARS-CoV-2 target nucleic acids are NOT DETECTED. The SARS-CoV-2 RNA is generally detectable in upper and lower respiratory specimens during the acute phase of infection. Negative results do not preclude SARS-CoV-2 infection, do not rule out co-infections with other pathogens, and should not be used as the sole basis for treatment or other patient management decisions. Negative results must be combined with clinical observations, patient history, and epidemiological information. The expected result is Negative. Fact Sheet for Patients: SugarRoll.be Fact Sheet for Healthcare Providers: https://www.woods-mathews.com/ This test is not yet approved or cleared by the [place] FDA  and  has been authorized for detection and/or diagnosis of SARS-CoV-2 by FDA under an Emergency Use Authorization (EUA). This EUA will remain  in effect (meaning this test can be used) for the duration of the COVID-19 declaration under Section 56 4(b)(1) of the Act, 21 U.S.C. section 360bbb-3(b)(1), unless the authorization is terminated or revoked sooner. Performed at [facility], [place] [address] [place], [place] [address]   MRSA PCR Screening     Status: None   Collection Time: 05/26/19 10:57 PM   Specimen: Nasal Mucosa; Nasopharyngeal  Result Value Ref Range Status   MRSA by PCR NEGATIVE NEGATIVE Final    Comment:        The GeneXpert MRSA Assay (FDA approved for NASAL specimens only), is one component of a comprehensive MRSA colonization surveillance program. It is not intended to diagnose MRSA infection nor to guide or monitor treatment for MRSA infections. Performed at [facility], [place] [address], [place], [place] [address]          Radiology Studies: DG Hip Malvin Johns or Wo Pelvis 2-3 Views Left  Result Date: 05/26/2019  CLINICAL DATA:  Witnessed fall, shortening and lateral rotation of the left leg, history of osteoporosis EXAM: DG HIP (WITH OR WITHOUT PELVIS) 2-3V LEFT COMPARISON:  None FINDINGS: Comminuted intertrochanteric fracture of the left proximal femur with valgus deformity and superior migration of the distal fracture fragment. There is some extension of the fracture into the subtrochanteric femur. No additional fracture is identified. Lateral view is limited due to overlying material but suggest posterior displacement also of the distal fracture component. IMPRESSION: Comminuted intertrochanteric fracture of the left proximal femur with valgus deformity and superior and posterior displacement of distal fracture elements with extension of the fracture into the subtrochanteric femur. Electronically Signed   By: Zetta Bills M.D.   On: 05/26/2019  15:29      Scheduled Meds: . calcium-vitamin D  1 tablet Oral Daily  . cholecalciferol  2,000 Units Oral Daily  . feeding supplement (ENSURE ENLIVE)  237 mL Oral BID BM  . heparin  5,000 Units Subcutaneous Q8H  . ipratropium-albuterol  3 mL Nebulization TID  . metoprolol tartrate  12.5 mg Oral BID  . predniSONE  15 mg Oral Q breakfast   Continuous Infusions:   LOS: 1 day      Debbe Odea, MD [facility] Pager: [email or web address] Password [email or web address] 05/27/2019, 10:03 AM

## 2019-05-27 NOTE — Consult Note (Signed)
NAME:  Heather Barnes, MRN:  [id number], DOB:  1934/03/02, LOS: 1 ADMISSION DATE:  05/26/2019, CONSULTATION DATE:  05/27/2019 REFERRING MD:  [facility] , CHIEF COMPLAINT:  Pre-op resp complaint   Brief History   See   History of present illness     85 year old gold stage IV COPD with status post radiation for early stage clinically suspicious non-small cell lung cancer.  On 4 L oxygen.  With hypercapnia.  I have known her for several years in the pulmonary office.  She has had declining gradual functional status over the last few years.  Most recently in June 2020 she was hospitalized and then sent to hospice and from there she got discharged to home with hospice care.  She says at home with a walker and sometimes even without a walker she was functional and moving around between the different rooms.  She would then be limited by her shortness of breath.  Then according to her and her son who I also spoke to approximately 4-5 weeks ago right before Thanksgiving 2020 she developed worsening back spasms and has been in bed.  She had a Foley catheter.  There is some episodes of delirium reported.  There is no COPD exacerbation or pneumonia.  She did not receive antibiotics during this time.  Then a few days ago prior to this admission the Foley catheter came out.  Patient tried to ambulate with a walker.  The son feels she got a little bit too aggressive trying to ambulate with a walker.  She slipped and fell and fractured her left hip.  This was on May 26, 2019.  She is now being hospitalized.  Surgery is an option to fix her pain and potentially help with her ability to ambulate.  Pulmonary has been consulted for preoperative pulmonary risk and overall goals of care alignment in the pulmonary context  According to the orthopedic surgeon Dr Griffin Basil  -as best as I learn from him the surgery is anywhere between 15-60 minutes in duration.  Spinal anesthesia could be attempted and his  preferred method.  If not it would be general.  The aim would be to improve pain from a palliative standpoint and also potentially return to weightbearing although the recent loss of mobility in the last few to several weeks has left her more deconditioned making postoperative weightbearing more challenging.  Patient has an understanding that without surgery she is likely to be bedbound for several months and this would be a less preferred goal of care for her.  The son is concerned that surgery adds to risk.   Past Medical History   Results for Heather, Barnes (MRN [id number]) as of 05/27/2019 15:48  Ref. Range 06/11/2015 08:31 06/11/2015 18:12 03/31/2016 07:58 08/30/2017 09:07 11/14/2017 10:40 11/24/2018 04:45 11/24/2018 06:33 11/25/2018 08:46  pH, Arterial Latest Ref Range: 7.350 - 7.450  7.373 7.379  7.383 7.423 7.280 (L) 7.201 (L) 7.352  pCO2 arterial Latest Ref Range: 32.0 - 48.0 mmHg 40.6 38.1  53.3 (H) 46.1 78.8 (HH) 91.3 (HH) 66.0 (HH)  pO2, Arterial Latest Ref Range: 83.0 - 108.0 mmHg 201 (H) 98.3  112 (H) 115 (H) 290 (H) 142 (H) 68.9 (L)    has a past medical history of Arthritis, Asthma, Breast cancer, right breast (HCC), Chronic lower back pain, Colon adenomas, Complication of anesthesia, COPD (chronic obstructive pulmonary disease) ([place]), Coronary artery calcification (09/01/2016), Dysrhythmia, GERD (gastroesophageal reflux disease), Goiter, History of duodenal ulcer, History of radiation therapy (  05/24/11-06/02/11), colonic polyps, Hyperlipidemia, Lung cancer, middle lobe ([place]) (04/26/2011), On home oxygen therapy, Osteoporosis, Osteoporosis, Palpitations (01/19/2016), Pneumonia (2017 X 2), Pollen allergy, PVC (premature ventricular contraction) (02/24/2016), Shortness of breath, and Wears glasses.   reports that she quit smoking about 20 years ago. Her smoking use included cigarettes. She has a 20.00 pack-year smoking history. She has never used smokeless tobacco.  Past Surgical History:    Procedure Laterality Date  . APPENDECTOMY    . BIOPSY THYROID  2016  . BREAST BIOPSY Right 03/2017  . BREAST LUMPECTOMY WITH RADIOACTIVE SEED LOCALIZATION Right 04/11/2017  . BREAST LUMPECTOMY WITH RADIOACTIVE SEED LOCALIZATION Right 04/11/2017   Procedure: RIGHT BREAST LUMPECTOMY WITH RADIOACTIVE SEED LOCALIZATION;  Surgeon: Rolm Bookbinder, MD;  Location: [facility];  Service: General;  Laterality: Right;  . CATARACT EXTRACTION W/ INTRAOCULAR LENS  IMPLANT, BILATERAL Bilateral   . COLONOSCOPY W/ BIOPSIES    . DILATION AND CURETTAGE OF UTERUS  "many"  . TONSILLECTOMY    . TOTAL ABDOMINAL HYSTERECTOMY      Allergies  Allergen Reactions  . Tiotropium Bromide Monohydrate Hives, Shortness Of Breath and Rash    Spiriva   . Codeine Other (See Comments)    Hyperactivity,crying  . Fosamax [Alendronate] Other (See Comments)    Muscle aches  . Asa [Aspirin] Other (See Comments)    Bleeding -ulcers  . Latex Rash    Immunization History  Administered Date(s) Administered  . Influenza Split 04/07/2013, 03/07/2014  . Influenza Whole 02/06/2011, 03/07/2012  . Influenza, High Dose Seasonal PF 03/07/2016, 02/05/2017, 02/09/2018  . Influenza,inj,Quad PF,6+ Mos 01/27/2015  . Influenza-Unspecified 01/05/2014  . Pneumococcal Conjugate-13 10/05/2013  . Pneumococcal Polysaccharide-23 06/08/2003  . Pneumococcal-Unspecified 11/05/2012    Family History  Problem Relation Age of Onset  . Emphysema Maternal Uncle   . Heart disease Mother   . Heart disease Maternal Grandfather   . Rheum arthritis Sister   . Cancer Sister   . Cancer Sister        throat cancer  . Brain cancer Sister   . Cancer Sister   . Ovarian cancer Sister   . Breast cancer Daughter      Current Facility-Administered Medications:  .  acetaminophen (TYLENOL) tablet 650 mg, 650 mg, Oral, Q4H PRN, Sid Falcon, MD, 650 mg at 05/27/19 1021 .  albuterol (PROVENTIL) (2.5 MG/3ML) 0.083% nebulizer solution 2.5 mg, 2.5 mg,  Nebulization, Q6H PRN, Einar Grad, RPH .  calcium-vitamin D (OSCAL WITH D) 500-200 MG-UNIT per tablet 1 tablet, 1 tablet, Oral, Daily, Gilles Chiquito B, MD .  cholecalciferol (VITAMIN D3) tablet 2,000 Units, 2,000 Units, Oral, Daily, Gilles Chiquito B, MD .  feeding supplement (ENSURE ENLIVE) (ENSURE ENLIVE) liquid 237 mL, 237 mL, Oral, BID BM, Gilles Chiquito B, MD .  heparin injection 5,000 Units, 5,000 Units, Subcutaneous, Q8H, Sid Falcon, MD, Stopped at 05/27/19 [id number] .  ipratropium-albuterol (DUONEB) 0.5-2.5 (3) MG/3ML nebulizer solution 3 mL, 3 mL, Nebulization, TID, Rizwan, Saima, MD .  metoprolol tartrate (LOPRESSOR) tablet 12.5 mg, 12.5 mg, Oral, BID, Gilles Chiquito B, MD, 12.5 mg at 05/27/19 1021 .  morphine CONCENTRATE 10 MG/0.5ML oral solution 2.6 mg, 2.6 mg, Oral, Q4H PRN, Gilles Chiquito B, MD .  ondansetron (ZOFRAN) tablet 4 mg, 4 mg, Oral, Q6H PRN **OR** ondansetron (ZOFRAN) injection 4 mg, 4 mg, Intravenous, Q6H PRN, Gilles Chiquito B, MD .  predniSONE (DELTASONE) tablet 15 mg, 15 mg, Oral, Q breakfast, Gilles Chiquito B, MD, 15 mg at 05/27/19 1022 .  senna-docusate (Senokot-S) tablet 1 tablet, 1 tablet, Oral, QHS PRN, Sid Falcon, MD   Significant Hospital Events   05/26/2019 - admit. Ortho consult 12/20 pccm consu;t  Consults:  Ortho Palliative pccm  Procedures:  x  Significant Diagnostic Tests:  x  Micro Data:  x  Antimicrobials:  x   Interim history/subjective:  12/20 - seen in bed 5n07  Objective   Blood pressure (!) 116/49, pulse 87, temperature (!) 97.5 F (36.4 C), temperature source Oral, resp. rate 16, height 4\' 10"  (1.473 m), weight 47.3 kg, SpO2 94 %.        Intake/Output Summary (Last 24 hours) at 05/27/2019 1450 Last data filed at 05/26/2019 2300 Gross per 24 hour  Intake 240 ml  Output --  Net 240 ml   Filed Weights   05/26/19 1433 05/26/19 2300  Weight: 49 kg 47.3 kg    Examination: General: Frail female.  Looks pleasant.   She is as cheerful as ever.  Lying down in bed.  Talkative HENT: Supple neck.  No elevated JVP.  No neck nodes. Lungs: Barrel chest no wheeze.  Normal pursed lip breathing as always.  No wheeze.  No evidence of COPD exacerbation Cardiovascular: Regular rate and rhythm.  No murmurs Abdomen: Abdomen soft nontender no organomegaly Extremities: Left lower extremity in external rotation Neuro: Alert and oriented x3.  Speech normal. GU: Not examined    LABS    PULMONARY No results for input(s): PHART, PCO2ART, PO2ART, HCO3, TCO2, O2SAT in the last 168 hours.  Invalid input(s): PCO2, PO2  CBC Recent Labs  Lab 05/26/19 1520 05/27/19 0734  HGB 12.0 11.1*  HCT 39.3 35.3*  WBC 11.0* 12.6*  PLT 181 188    COAGULATION No results for input(s): INR in the last 168 hours.  CARDIAC  No results for input(s): TROPONINI in the last 168 hours. No results for input(s): PROBNP in the last 168 hours.   CHEMISTRY Recent Labs  Lab 05/26/19 1520 05/27/19 0734  NA 138 135  K 4.0 4.1  CL 101 96*  CO2 31 31  GLUCOSE 156* 109*  BUN 12 8  CREATININE 0.30* 0.42*  CALCIUM 8.4* 9.0   Estimated Creatinine Clearance: 34.4 mL/min (A) (by C-G formula based on SCr of 0.42 mg/dL (L)).   LIVER Recent Labs  Lab 05/26/19 1520  AST 12*  ALT 17  ALKPHOS 99  BILITOT 0.5  PROT 5.2*  ALBUMIN 3.2*     INFECTIOUS No results for input(s): LATICACIDVEN, PROCALCITON in the last 168 hours.   ENDOCRINE CBG (last 3)  No results for input(s): GLUCAP in the last 72 hours.       IMAGING x48h  - image(s) personally visualized  -   highlighted in bold DG Hip Unilat W or Wo Pelvis 2-3 Views Left  Result Date: 05/26/2019 CLINICAL DATA:  Witnessed fall, shortening and lateral rotation of the left leg, history of osteoporosis EXAM: DG HIP (WITH OR WITHOUT PELVIS) 2-3V LEFT COMPARISON:  None FINDINGS: Comminuted intertrochanteric fracture of the left proximal femur with valgus deformity and  superior migration of the distal fracture fragment. There is some extension of the fracture into the subtrochanteric femur. No additional fracture is identified. Lateral view is limited due to overlying material but suggest posterior displacement also of the distal fracture component. IMPRESSION: Comminuted intertrochanteric fracture of the left proximal femur with valgus deformity and superior and posterior displacement of distal fracture elements with extension of the fracture into the subtrochanteric femur. Electronically Signed  By: Zetta Bills M.D.   On: 05/26/2019 15:29      1) RISK FOR PROLONGED MECHANICAL VENTILAION - > 48h  1A) Arozullah - Prolonged mech ventilation risk Arozullah Postperative Pulmonary Risk Score - for mech ventilation dependence >48h [employer], Ann Surg 2000, major non-cardiac surgery) Comment Score  Type of surgery - abd ao aneurysm (27), thoracic (21), neurosurgery / upper abdominal / vascular (21), neck (11) Hip fracture 0  Emergency Surgery - (11) emergencey yes 11  ALbumin < 3 or poor nutritional state - (9) 3.2 on 05/26/2019 0  BUN > 30 -  (8) 8 on 05/26/2019 0  Partial or completely dependent functional status - (7) Poor mobility at home 7 - recent weeks fully dependent  COPD -  (6) cOPD 6  Age - 74 to 43 (4), > 70  (6) Age 64 6  TOTAL  30  Risk Stratifcation scores  - < 10 (0.5%), 11-19 (1.8%), 20-27 (4.2%), 28-40 (10.1%), >40 (26.6%)  Moderate - high risk - 10%      1B) GUPTA - Prolonged Mech Vent Risk Score source Risk  Guptal post op prolonged mech ventilation > 48h or reintubation < 30 days - ACS 2007-2008 dataset - http://lewis-perez.info/ 8.6% - 15% assuming partially and fully  dependent and ASA class 4    2) RISK FOR POST OP PNEUMONIA Score source Risk  Lyndel Safe - Post Op Pnemounia risk  [email or web address] 6.5%  - 7.7% assuming partially and fully   dependent functional status and ASA class 4    R3) ISK FOR ANY POST-OP PULMONARY COMPLICATION Score source Risk  CANET/ARISCAT Score - risk for ANY/ALl pulmonary complications - > risk of in-hospital post-op pulmonary complications (composite including respiratory failure, respiratory infection, pleural effusion, atelectasis, pneumothorax, bronchospasm, aspiration pneumonitis) [employer] - based on age, anemia, pulse ox, resp infection prior 30d, incision site, duration of surgery, and emergency v elective surgery 48 points - 42% risk for "any:" complications.      Resolved Hospital Problem list   x  Assessment & Plan:   1)  RISK FOR ANY POST-OP PULMONARY COMPLICATION: 88% risk for "any" complication and this includes atelectasis , pneumona 2) RISK FOR POST OP PNEUMONIA:  6.5% - 8%risk 3)   RISK FOR PROLONGED MECHANICAL VENTILAION - > 48h : 8-15% risk (avg 10%)    Risk aggravating factors are - age, copd, o2 dependency  And recent decline in functional status  Risk ameliorating factors are  - normal renal function, albumin and partially (not fully) dependent functional status pre-op status, hgb > 10, no infection hx if < 30 days, short duration of surgery (per Dr Griffin Basil 15-60 min sugrgery time), and peripheral limb surgery. Spinal anesthesia will reduce risk further   Overall - she Is very likely/high risk  to get some sort of respiratory complication such as atelectasis or fever or increase o2 need. But with short surgery and esp if spinal risk for ventilator dependence is < 10-15% . This 10-15% is multi-fold higher than normal/avg risk however but ? Is acceptable to family   Decision making: opioids for pain control with benzo for anxiety can reduce resp drive in short run. Patient can undergo surgery if she/family and surgeon are aligned with the risk . Otherwise, conservative and supportive management only.    If  surgery - recommend  1. Short duration of surgery as much as possible and avoid paralytic if possible 2. Recommend spinal anesthesia if possible. If unable then ok for short  general 2.Extubate to BiPAP post op 4. Recovery in step down or ICU with Pulmonary consultation 5. DNR/No CPR if arrest 6. Align goals of care - she and family should know 100% (Dr Chase Caller explained to patient and son) that is palliative intent for surgery and death or inability to dc from hospital is possible 7. DVT prophylaxis 8. Aggressive pulmonary toilet with o2, bronchodilatation, and incentive spirometry and early ambulation   D/w patient - patient prefers to take above risk and undergo surgery.  D/w Son Livia Snellen - he is worried above risk is too high and patient over optimistic. Explained that one way of looking at situation is that surgery is palliative and is an opportunity to reduce pain and weight bear . MEasures to improve quality of life. If she ends up vent dependent or has cardiac arrest peri or post op then we would have taken an acceptable risk c/w goals for patient and then can immediately palliate. He wants palliative/hospice  Input. He understands patient is final decision maker\   CCM will be available. This MD will be off but if needed try paging  Dw/ Dr Ophelia Charter     Best practice:  Per [facility]   SIGNATURE    Dr. Brand Males, M.D., F.C.C.P,  Pulmonary and Critical Care Medicine Staff Physician, [place] Director - Interstitial Lung Disease  Program  P[facility] [place] [place], [place], [address]  Pager: [phone], If no answer or between  15:00h - 7:00h: call [phone] Telephone: [phone]  3:07 PM 05/27/2019    LABS    PULMONARY No results for input(s): PHART, PCO2ART, PO2ART, HCO3, TCO2, O2SAT in the last 168 hours.  Invalid input(s): PCO2, PO2  CBC Recent Labs  Lab 05/26/19 1520  05/27/19 0734  HGB 12.0 11.1*  HCT 39.3 35.3*  WBC 11.0* 12.6*  PLT 181 188    COAGULATION No results for input(s): INR in the last 168 hours.  CARDIAC  No results for input(s): TROPONINI in the last 168 hours. No results for input(s): PROBNP in the last 168 hours.   CHEMISTRY Recent Labs  Lab 05/26/19 1520 05/27/19 0734  NA 138 135  K 4.0 4.1  CL 101 96*  CO2 31 31  GLUCOSE 156* 109*  BUN 12 8  CREATININE 0.30* 0.42*  CALCIUM 8.4* 9.0   Estimated Creatinine Clearance: 34.4 mL/min (A) (by C-G formula based on SCr of 0.42 mg/dL (L)).   LIVER Recent Labs  Lab 05/26/19 1520  AST 12*  ALT 17  ALKPHOS 99  BILITOT 0.5  PROT 5.2*  ALBUMIN 3.2*     INFECTIOUS No results for input(s): LATICACIDVEN, PROCALCITON in the last 168 hours.   ENDOCRINE CBG (last 3)  No results for input(s): GLUCAP in the last 72 hours.       IMAGING x48h  - image(s) personally visualized  -   highlighted in bold DG Hip Unilat W or Wo Pelvis 2-3 Views Left  Result Date: 05/26/2019 CLINICAL DATA:  Witnessed fall, shortening and lateral rotation of the left leg, history of osteoporosis EXAM: DG HIP (WITH OR WITHOUT PELVIS) 2-3V LEFT COMPARISON:  None FINDINGS: Comminuted intertrochanteric fracture of the left proximal femur with valgus deformity and superior migration of the distal fracture fragment. There is some extension of the fracture into the subtrochanteric femur. No additional fracture is identified. Lateral view is limited due to overlying material but suggest  posterior displacement also of the distal fracture component. IMPRESSION: Comminuted intertrochanteric fracture of the left proximal femur with valgus deformity and superior and posterior displacement of distal fracture elements with extension of the fracture into the subtrochanteric femur. Electronically Signed   By: Zetta Bills M.D.   On: 05/26/2019 15:29

## 2019-05-27 NOTE — Plan of Care (Signed)
  Problem: Education: Goal: Knowledge of General Education information will improve Description: Including pain rating scale, medication(s)/side effects and non-pharmacologic comfort measures Outcome: Progressing   Problem: Health Behavior/Discharge Planning: Goal: Ability to manage health-related needs will improve Outcome: Progressing   Problem: Activity: Goal: Risk for activity intolerance will decrease Outcome: Progressing   Problem: Coping: Goal: Level of anxiety will decrease Outcome: Progressing   Problem: Pain Managment: Goal: General experience of comfort will improve Outcome: Progressing   Problem: Safety: Goal: Ability to remain free from injury will improve Outcome: Progressing   Problem: Pain Management: Goal: Pain level will decrease Outcome: Progressing

## 2019-05-27 NOTE — Plan of Care (Signed)
  Problem: Activity: Goal: Risk for activity intolerance will decrease Outcome: Progressing   Problem: Coping: Goal: Level of anxiety will decrease Outcome: Progressing   Problem: Pain Managment: Goal: General experience of comfort will improve Outcome: Progressing   Problem: Safety: Goal: Ability to remain free from injury will improve Outcome: Progressing   Problem: Skin Integrity: Goal: Risk for impaired skin integrity will decrease Outcome: Progressing   

## 2019-05-27 NOTE — Progress Notes (Signed)
Manufacturing engineer [facility]): RN note @0930   This is a related and covered GIP admission of 6/30 2020 with ACC diagnosis of COPD per Dr. Tomasa Hosteller. Patient has an [place] DNR in home. Daughter contacted on call triage 05/26/2019 indicating patient had fallen and injuired her hip. States she call EMS and pt transported to [facility] ED.  Pt was later transported to [facility] and admitted for closed fracture of left hip under orthopedic services for further evaluation.   V/S: 97.5, 116/49, RR 16, HR 87, SPO2 94% R/A  I&O: 12/19- 240/ Urinary incontinence Abnormal Labs: 05/27/2019-P02, arterial 71.5, Acid base excess 5.6, Bicarbonate 30.0, Allen test positive, Chloride 96, Glucose 109, Creatinine 0.42, WBC 12.6, Hemoglobin 11.1 and HCT 35.3. Diagnostics: EXAM:DG HIP (WITH OR WITHOUT PELVIS) 2-3V LEFT- fracture of the left proximal femurwith valgus deformity and superior and posterior displacement of distal fracture elements with extension of the fracture into the subtrochanteric femur.  IVs & PRNs: 20g Left anticubital  Problem List: If surgical interventions is presented. -Risk of prolonged mechanical ventilation -Risk for post op pneumonia -Risk for any post-op pulmonary complications -Risk factor noted-age, COPD, O2 dependency and recent decline in functional status  GOC: Unclear and remains ongoing.  Spoke with daughter(Deborah) who indicates possible surgery however undecided at this time.   D/C Planning: Ongoing. Lives with (son [name] and daughter [name]). [name] is the contact person @ [phone].  IDT: Team updated.  Family: Spoke with Daughter [name]) today.  Vicente Serene.BSN [place] are on [place]

## 2019-05-27 NOTE — Progress Notes (Signed)
I spoke with the patient's son and daughter on the phone.  We had an extensive discussion about goals of care.  We clearly laid out the fact that surgery in this patient would be to attempt to help mobilization and for pain control purposes.  Based on the patient's inability to mobilize for the 3 weeks prior to her fall we think that mobilization would likely be of minimal benefit.  If the patient's pain is unable to be controlled or the family/patient feels they want to try everything possible we are happy to reconsult and get involved however at this time we would advise the medical and pulmonary teams to discuss risks with patient as they desire and potentially get hospice involved again.  Were happy to provide surgical care if the family decides they want that but for the time being it sounds as if they would like to avoid surgery until they discuss it further with Heather Barnes.   Please contact me if there are any other issues and I am happy to provide further input.

## 2019-05-28 DIAGNOSIS — W19XXXA Unspecified fall, initial encounter: Secondary | ICD-10-CM

## 2019-05-28 DIAGNOSIS — Z7189 Other specified counseling: Secondary | ICD-10-CM

## 2019-05-28 DIAGNOSIS — S72002G Fracture of unspecified part of neck of left femur, subsequent encounter for closed fracture with delayed healing: Secondary | ICD-10-CM

## 2019-05-28 DIAGNOSIS — J9611 Chronic respiratory failure with hypoxia: Secondary | ICD-10-CM

## 2019-05-28 DIAGNOSIS — W19XXXD Unspecified fall, subsequent encounter: Secondary | ICD-10-CM

## 2019-05-28 DIAGNOSIS — J449 Chronic obstructive pulmonary disease, unspecified: Secondary | ICD-10-CM

## 2019-05-28 DIAGNOSIS — J9612 Chronic respiratory failure with hypercapnia: Secondary | ICD-10-CM

## 2019-05-28 LAB — CBC
HCT: 33.5 % — ABNORMAL LOW (ref 36.0–46.0)
Hemoglobin: 11 g/dL — ABNORMAL LOW (ref 12.0–15.0)
MCH: 28.1 pg (ref 26.0–34.0)
MCHC: 32.8 g/dL (ref 30.0–36.0)
MCV: 85.5 fL (ref 80.0–100.0)
Platelets: 204 10*3/uL (ref 150–400)
RBC: 3.92 MIL/uL (ref 3.87–5.11)
RDW: 15.5 % (ref 11.5–15.5)
WBC: 12.6 10*3/uL — ABNORMAL HIGH (ref 4.0–10.5)
nRBC: 0 % (ref 0.0–0.2)

## 2019-05-28 LAB — PHOSPHORUS: Phosphorus: 2.9 mg/dL (ref 2.5–4.6)

## 2019-05-28 LAB — MAGNESIUM: Magnesium: 2 mg/dL (ref 1.7–2.4)

## 2019-05-28 MED ORDER — CHLORHEXIDINE GLUCONATE 4 % EX LIQD
60.0000 mL | Freq: Once | CUTANEOUS | Status: AC
  Administered 2019-05-29: 4 via TOPICAL

## 2019-05-28 MED ORDER — ENSURE PRE-SURGERY PO LIQD
296.0000 mL | Freq: Once | ORAL | Status: AC
  Administered 2019-05-28: 20:00:00 296 mL via ORAL
  Filled 2019-05-28: qty 296

## 2019-05-28 MED ORDER — CEFAZOLIN SODIUM-DEXTROSE 2-4 GM/100ML-% IV SOLN
2.0000 g | INTRAVENOUS | Status: AC
  Administered 2019-05-29: 2 g via INTRAVENOUS
  Filled 2019-05-28: qty 100

## 2019-05-28 MED ORDER — POVIDONE-IODINE 10 % EX SWAB: 2.0000 "application " | Freq: Once | CUTANEOUS | Status: DC

## 2019-05-28 NOTE — Progress Notes (Addendum)
Daily Progress Note   Patient Name: Heather Barnes       Date: 05/28/2019 DOB: [birth year]  Age: 85 y.o. MRN#: [id number] Attending Physician: Deatra James, MD Primary Care Physician: Orpah Melter, MD Admit Date: 05/26/2019  Reason for Consultation/Follow-up: Establishing goals of care  Subjective: Patient awake, alert, oriented and pleasant. Able to participate in conversation. Denies current pain or discomfort. Ate majority of breakfast.   GOC:  Extensive time spent with patient at bedside--reviewing history of chronic COPD including her hospitalization in June 2020. She shares her traumatic experience of waking up on the ventilator and has told her children to "never" put her through that again. She confirms her decision for DNR code status. She has been receiving support from hospice services in her home and is very appreciative of their care.   Discussed events leading up to admission and course of hospitalization including diagnoses, interventions, plan of care. Reviewed Dr. Golden Pop note from yesterday explaining high risk for surgery. Patient shares that she has thought and prayed about this and has made the decision to pursue surgical intervention, understanding the risks. She shares that two years ago, she required surgical intervention for removal of breast cancer and was high risk at this time but survived. Dr. Griffin Basil at bedside during my visit. He also explained risks for surgery and goal of surgery being palliative in nature/manage pain and unlikely this surgery will assist with mobility, with previous poor functional status. Patient shares her hope to at least be able to stand and pivot on good days and spend time with her grandsons.   Therapeutic listening as patient  shares scripture and her strong, Christian faith. She shares that scripture is her true pain control. Emotional/spiritual support provided.   **Shortly after, spoke with son and daughter [name] and [name]) via telephone to review diagnoses, interventions, plan of care. They understand their mother has made decision for surgical intervention. [name] and [name] are realistic in the fact that it is unlikely the surgery will assist with mobility/functional status but hopeful it will help manage her pain. [name] is realistic in the fact that she likely will not be able to participate in therapy following surgery because of her underlying chronic respiratory status and COPD. Reassured of ongoing palliative support this admission pending clinical course following surgical intervention. PMT  contact information given to son.   Length of Stay: 2  Current Medications: Scheduled Meds:  . calcium-vitamin D  1 tablet Oral Daily  . cholecalciferol  2,000 Units Oral Daily  . feeding supplement (ENSURE ENLIVE)  237 mL Oral BID BM  . heparin  5,000 Units Subcutaneous Q8H  . ipratropium-albuterol  3 mL Nebulization TID  . metoprolol tartrate  12.5 mg Oral BID  . predniSONE  15 mg Oral Q breakfast    Continuous Infusions:   PRN Meds: acetaminophen, albuterol, morphine CONCENTRATE, ondansetron **OR** ondansetron (ZOFRAN) IV, senna-docusate  Physical Exam Vitals and nursing note reviewed.  Constitutional:      General: She is awake.  HENT:     Head: Normocephalic and atraumatic.  Pulmonary:     Effort: No tachypnea, accessory muscle usage or respiratory distress.  Skin:    General: Skin is warm and dry.  Neurological:     Mental Status: She is alert and oriented to person, place, and time.  Psychiatric:        Mood and Affect: Mood normal.        Speech: Speech normal.        Behavior: Behavior normal.        Cognition and Memory: Cognition normal.            Vital Signs: BP (!) 129/54 (BP  Location: Left Arm) Comment: nurse notified  Pulse 99   Temp 97.8 F (36.6 C) (Oral)   Resp 18   Ht 4\' 10"  (1.473 m)   Wt 47.3 kg   SpO2 98%   BMI 21.79 kg/m  SpO2: SpO2: 98 % O2 Device: O2 Device: Nasal Cannula O2 Flow Rate: O2 Flow Rate (L/min): 3 L/min  Intake/output summary:   Intake/Output Summary (Last 24 hours) at 05/28/2019 1240 Last data filed at 05/28/2019 0900 Gross per 24 hour  Intake 360 ml  Output 400 ml  Net -40 ml   LBM: Last BM Date: 05/27/19 Baseline Weight: Weight: 49 kg Most recent weight: Weight: 47.3 kg       Palliative Assessment/Data: PPS 30%      Patient Active Problem List   Diagnosis Date Noted  . Closed left hip fracture ([place]) 05/27/2019  . Palliative care encounter   . Fall   . SOB (shortness of breath)   . Encounter for palliative care   . Goals of care, counseling/discussion   . Shortness of breath   . Acute on chronic respiratory failure with hypoxia and hypercapnia ([place]) 11/30/2018  . Sinusitis 12/12/2017  . 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 Coronary artery calcification 09/01/2016  . Bilateral low back pain with sciatica 05/10/2016  . Bronchopneumonia 05/10/2016  . CAP (community acquired pneumonia) 03/31/2016  . Chest pain 03/31/2016  . Tachycardia 03/31/2016  . PVC (premature ventricular contraction) 02/24/2016  . Palpitations 01/19/2016  . Respiratory failure ([place]) 06/11/2015  . Acute on chronic respiratory failure ([place]) 06/11/2015  . Knee pain, right 07/02/2014  . Chronic respiratory failure with hypoxia ([place]) 07/02/2014  . COPD, very severe ([place]) 07/02/2014  . COPD exacerbation ([place]) 06/13/2013  . Right rib fracture 05/15/2012  . Fatigue 07/22/2011  . Oral thrush 07/22/2011  . Lung cancer, middle lobe ([place]) 04/26/2011  . COPD (chronic obstructive pulmonary disease) ([place]) 01/13/2011    Palliative Care Assessment & Plan   Patient Profile: Heather Barnes is a 85 y.o. female with  multiple medical problems including O2 dependent COPD on 3 L  at baseline, CAD, OA, osteoporosis, history of lung cancer and breast cancer, who was hospitalized in June 2020 for severe COPD exacerbation requiring short-term intubation.  Patient was subsequently discharged to residential hospice but ultimately improved and is now followed at home with hospice.  She is now admitted on 05/26/2019 with hip pain after mechanical fall.  Patient was subsequently found to have a left intertrochanteric fracture.  Palliative care was consulted to help address goals.  Assessment: Closed left hip fracture Chronic respiratory failure with hypoxia COPD Hypertension Hx of lung cancer and breast cancer  Recommendations/Plan:  DNR, otherwise continue current plan of care and medical management.  Greatly appreciate pulmonology and ortho input.   Patient wishes to pursue surgical intervention, understanding risks. Dr. Griffin Basil aware and tentatively planned for 12/22.  Hospice patient prior to admission with chronic respiratory failure secondary to COPD. PMT will continue to follow clinical course inpatient.   Code Status: DNR   Code Status Orders  (From admission, onward)         Start     Ordered   05/26/19 2012  Do not attempt resuscitation (DNR)  Continuous    Question Answer Comment  In the event of cardiac or respiratory ARREST Do not call a "code blue"   In the event of cardiac or respiratory ARREST Do not perform Intubation, CPR, defibrillation or ACLS   In the event of cardiac or respiratory ARREST Use medication by any route, position, wound care, and other measures to relive pain and suffering. May use oxygen, suction and manual treatment of airway obstruction as needed for comfort.   Comments Confirmed with family      05/26/19 2011        Code Status History    Date Active Date Inactive Code Status Order ID Comments User Context   11/29/2018 1425 05/26/2019 1402 DNR [id number]  Laurin Coder, MD Outpatient   11/24/2018 0738 11/29/2018 1425 Full Code [id number]  Chesley Mires, MD ED   04/11/2017 1328 04/12/2017 1309 Full Code [id number]  Rolm Bookbinder, MD Inpatient   03/31/2016 0925 04/06/2016 1814 Full Code [id number]  Waldemar Dickens, MD ED   03/31/2016 0925 03/31/2016 0925 Full Code [id number]  Waldemar Dickens, MD ED   06/11/2015 1001 06/18/2015 2022 Full Code [id number]  Erick Colace, NP ED   06/11/2015 1001 06/11/2015 1001 Full Code [id number]  Erick Colace, NP ED   Advance Care Planning Activity       Prognosis:   Unable to determine  Discharge Planning:  To Be Determined  Care plan was discussed with patient, RN, son/daughter, Dr. Griffin Basil  Thank you for allowing the Palliative Medicine Team to assist in the care of this patient.   Time In: 1140- 1325-  Time Out: 1240 1340 Total Time 62min Prolonged Time Billed  yes      Greater than 50%  of this time was spent counseling and coordinating care related to the above assessment and plan.  Ihor Dow, DNP, FNP-C Palliative Medicine Team  Phone: [phone] Fax: [phone]  Please contact Palliative Medicine Team phone at [phone] for questions and concerns.

## 2019-05-28 NOTE — H&P (View-Only) (Signed)
We discussed patient's care at length with this Mrs. Jorgensen again.  We specifically went through the high likelihood of perioperative morbidity and mortality as well as extensive recovery and the unlikely situation that she would be able to walk successfully after this.  I very specifically recommended the surgery was primarily for pain control reasons and though she reports she is in very little pain now she wants to take the risk associated with surgery on the small chance that she is able to ambulate well again.  We did again reinforce that and the patient has been so immobile for so long that it is very unlikely that she walks well after this however does not impossible.  She does appear to be somewhat distractible but overall seems to understand the nature of her procedure and the risks and benefits with it.  She is consentable we feel and will discuss this with her family as well but we will plan for surgery with one of my partners tomorrow.  Please keep n.p.o. after midnight.

## 2019-05-28 NOTE — Progress Notes (Signed)
PROGRESS NOTE    Heather Barnes   HWK:[id number]  DOB: [birth year]  DOA: 05/26/2019 PCP: Orpah Melter, MD   Brief Narrative:  Heather Barnes 85 y.o. female with medical history significant of PVCs, osteoporosis, lung cancer (completed treatment), breast cancer (completed treatment), CAD, arthritis, COPD on 3L - on home hospice for this diagnosis.  She presented after a mechanical fall using her walker.  She notes that she has been in bed for the past 3 weeks due to back spasms and not being able to walk.  She had been feeling weak and has only recently been back up and moving.   In the ED the patient was found to have a comminuted intertrochanteric fracture of the left proximal femur.  Subjective: The patient was seen and examined this morning, remained stable, continues to complain of pain in the left hip especially with movement.  Remained stable on 3 L of oxygen, satting 98%.  Family and patient had extensive discussion with palliative care, pulmonary critical care team. The risk and benefit of surgery was discussed with the patient and family. She is yet to make a decision to pursue with surgery as she was told she is high risk   She is stating she will make up her mind today along with her family members: son and daughter.  Assessment & Plan:  Principal Problem:   Closed left hip fracture  -As this patient is on hospice at home, orthopedic surgery is awaiting further discussions ensure that surgery is what the patient and the family want   -Pulmonary critical care team, palliative care team was consulted, extensive discussion with the patient and family was held as she is high risk for surgical intervention. Recommendation from PCCM were discussed with the patient and family in detail.   Active Problems:   COPD (chronic obstructive pulmonary disease) /chronic respiratory failure with hypoxia -Remained stable on 3 L of oxygen -She admits to mild chronic cough but  no increasing shortness of breath or wheezing -Continue daily prednisone, DuoNebs and as needed morphine which she uses for dyspnea  Hypertension -Continue metoprolol  Time spent in minutes: 30 minutes DVT prophylaxis: Heparin Code Status: DO NOT RESUSCITATE Family Communication:  Disposition Plan: Awaiting further decision on surgery Consultants:   Orthopedic surgery Procedures:    Antimicrobials:  Anti-infectives (From admission, onward)   None       Objective: Vitals:   05/27/19 2034 05/28/19 0314 05/28/19 0805 05/28/19 0914  BP:  (!) 109/51  (!) 129/54  Pulse:  (!) 102  99  Resp:  14  18  Temp:  98.9 F (37.2 C)  97.8 F (36.6 C)  TempSrc:  Oral  Oral  SpO2: 94% 93% 96% 98%  Weight:      Height:        Intake/Output Summary (Last 24 hours) at 05/28/2019 1130 Last data filed at 05/28/2019 0900 Gross per 24 hour  Intake 360 ml  Output 400 ml  Net -40 ml   Filed Weights   05/26/19 1433 05/26/19 2300  Weight: 49 kg 47.3 kg   Ht 4\' 10"  (1.473 m)   Wt 47.3 kg   SpO2 98%   BMI 21.79 kg/m    Physical Exam  Constitution:  Alert, cooperative, no distress, remains comfortable on O2 via nasal cannula, 3 L Psychiatric: Normal and stable mood and affect, cognition intact,   HEENT: Normocephalic, PERRL, otherwise with in Normal limits  Chest:Chest symmetric Cardio vascular:  S1/S2, RRR, No murmure,  No Rubs or Gallops  pulmonary: Clear to auscultation bilaterally, respirations unlabored, negative wheezes / crackles Abdomen: Soft, non-tender, non-distended, bowel sounds,no masses, no organomegaly Muscular skeletal:  Left hip pain, range of motion limited in the left lower extremity  Otherwise Limited exam - in bed, able to move all other 3 extremities, Normal strength,  Neuro: CNII-XII intact. , normal motor and sensation, reflexes intact  Extremities: No pitting edema lower extremities, +2 pulses  Skin: Dry, warm to touch, negative for any Rashes, No open  wounds Left hip hematoma noted Wounds: per nursing documentation       Data Reviewed: I have personally reviewed following labs and imaging studies  CBC: Recent Labs  Lab 05/26/19 1520 05/27/19 0734 05/28/19 0522  WBC 11.0* 12.6* 12.6*  NEUTROABS 10.0*  --   --   HGB 12.0 11.1* 11.0*  HCT 39.3 35.3* 33.5*  MCV 89.7 86.3 85.5  PLT 181 188 381   Basic Metabolic Panel: Recent Labs  Lab 05/26/19 1520 05/27/19 0734 05/28/19 0522  NA 138 135  --   K 4.0 4.1  --   CL 101 96*  --   CO2 31 31  --   GLUCOSE 156* 109*  --   BUN 12 8  --   CREATININE 0.30* 0.42*  --   CALCIUM 8.4* 9.0  --   MG  --   --  2.0  PHOS  --   --  2.9   GFR: Estimated Creatinine Clearance: 34.4 mL/min (A) (by C-G formula based on SCr of 0.42 mg/dL (L)). Liver Function Tests: Recent Labs  Lab 05/26/19 1520  AST 12*  ALT 17  ALKPHOS 99  BILITOT 0.5  PROT 5.2*  ALBUMIN 3.2*  Urine analysis:    Component Value Date/Time   COLORURINE YELLOW 06/11/2015 1308   APPEARANCEUR CLOUDY (A) 06/11/2015 1308   LABSPEC 1.018 06/11/2015 1308   PHURINE 6.0 06/11/2015 1308   GLUCOSEU >1000 (A) 06/11/2015 1308   HGBUR TRACE (A) 06/11/2015 1308   BILIRUBINUR NEGATIVE 06/11/2015 1308   KETONESUR NEGATIVE 06/11/2015 1308   PROTEINUR NEGATIVE 06/11/2015 1308   NITRITE NEGATIVE 06/11/2015 1308   LEUKOCYTESUR SMALL (A) 06/11/2015 1308   Sepsis Labs: @LABRCNTIP (procalcitonin:4,lacticidven:4) Recent Results (from the past 240 hour(s))  SARS CORONAVIRUS 2 (TAT 6-24 HRS) Nasopharyngeal Nasopharyngeal Swab     Status: None   Collection Time: 05/26/19  6:09 PM   Specimen: Nasopharyngeal Swab  Result Value Ref Range Status   SARS Coronavirus 2 NEGATIVE NEGATIVE Final    Comment: (NOTE) SARS-CoV-2 target nucleic acids are NOT DETECTED. The SARS-CoV-2 RNA is generally detectable in upper and lower respiratory specimens during the acute phase of infection. Negative results do not preclude SARS-CoV-2 infection,  do not rule out co-infections with other pathogens, and should not be used as the sole basis for treatment or other patient management decisions. Negative results must be combined with clinical observations, patient history, and epidemiological information. The expected result is Negative. Fact Sheet for Patients: SugarRoll.be Fact Sheet for Healthcare Providers: https://www.woods-mathews.com/ This test is not yet approved or cleared by the [place] FDA and  has been authorized for detection and/or diagnosis of SARS-CoV-2 by FDA under an Emergency Use Authorization (EUA). This EUA will remain  in effect (meaning this test can be used) for the duration of the COVID-19 declaration under Section 56 4(b)(1) of the Act, 21 U.S.C. section 360bbb-3(b)(1), unless the authorization is terminated or revoked sooner. Performed at [facility], [place]  [address], [place], [place] [address]   MRSA PCR Screening     Status: None   Collection Time: 05/26/19 10:57 PM   Specimen: Nasal Mucosa; Nasopharyngeal  Result Value Ref Range Status   MRSA by PCR NEGATIVE NEGATIVE Final    Comment:        The GeneXpert MRSA Assay (FDA approved for NASAL specimens only), is one component of a comprehensive MRSA colonization surveillance program. It is not intended to diagnose MRSA infection nor to guide or monitor treatment for MRSA infections. Performed at [facility], [place] [address], [place], [place] [address]          Radiology Studies: DG Hip Unilat W or Wo Pelvis 2-3 Views Left  Result Date: 05/26/2019 CLINICAL DATA:  Witnessed fall, shortening and lateral rotation of the left leg, history of osteoporosis EXAM: DG HIP (WITH OR WITHOUT PELVIS) 2-3V LEFT COMPARISON:  None FINDINGS: Comminuted intertrochanteric fracture of the left proximal femur with valgus deformity and superior migration of the distal fracture fragment. There is some  extension of the fracture into the subtrochanteric femur. No additional fracture is identified. Lateral view is limited due to overlying material but suggest posterior displacement also of the distal fracture component. IMPRESSION: Comminuted intertrochanteric fracture of the left proximal femur with valgus deformity and superior and posterior displacement of distal fracture elements with extension of the fracture into the subtrochanteric femur. Electronically Signed   By: Zetta Bills M.D.   On: 05/26/2019 15:29      Scheduled Meds: . calcium-vitamin D  1 tablet Oral Daily  . cholecalciferol  2,000 Units Oral Daily  . feeding supplement (ENSURE ENLIVE)  237 mL Oral BID BM  . heparin  5,000 Units Subcutaneous Q8H  . ipratropium-albuterol  3 mL Nebulization TID  . metoprolol tartrate  12.5 mg Oral BID  . predniSONE  15 mg Oral Q breakfast   Continuous Infusions:   LOS: 2 days      Deatra James, MD [facility] Pager: [email or web address] Password [email or web address] 05/28/2019, 11:30 AM

## 2019-05-28 NOTE — Progress Notes (Signed)
We discussed patient's care at length with this Mrs. Schul again.  We specifically went through the high likelihood of perioperative morbidity and mortality as well as extensive recovery and the unlikely situation that she would be able to walk successfully after this.  I very specifically recommended the surgery was primarily for pain control reasons and though she reports she is in very little pain now she wants to take the risk associated with surgery on the small chance that she is able to ambulate well again.  We did again reinforce that and the patient has been so immobile for so long that it is very unlikely that she walks well after this however does not impossible.  She does appear to be somewhat distractible but overall seems to understand the nature of her procedure and the risks and benefits with it.  She is consentable we feel and will discuss this with her family as well but we will plan for surgery with one of my partners tomorrow.  Please keep n.p.o. after midnight.

## 2019-05-28 NOTE — Plan of Care (Signed)

## 2019-05-28 NOTE — Progress Notes (Signed)
NAME:  Heather Barnes, MRN:  [id number], DOB:  Oct 21, 1933, LOS: 2 ADMISSION DATE:  05/26/2019, CONSULTATION DATE:  05/27/2019 REFERRING MD:  Dr. Roger Shelter, [facility], CHIEF COMPLAINT:  Hip pain   Brief History   85 yo female former smoker followed by Dr. Chase Caller for advanced COPD and chronic hypoxic/hypercapnic respiratory failure on 4 liters O2 and NSCLC s/p XRT and enrolled in home hospice fell at home.  Found to have comminuted intertrochanteric fracture of Lt proximal femur.  PCCM consulted to assess respiratory status prior to surgery.  Past Medical History  Osteoporosis, Breast cancer, CAD, PNA, HLD, Colon polyps, Duodenal ulcer, GERD  Significant Hospital Events   12/19 Admit  Consults:  Ortho  Procedures:    Significant Diagnostic Tests:    Micro Data:  SARS Cov2 12/19 >> negative  Antimicrobials:    Interim history/subjective:  Breathing okay.  Not having chest pain, cough, wheeze, or sputum.  Objective   Blood pressure (!) 118/52, pulse 100, temperature 98.3 F (36.8 C), temperature source Oral, resp. rate 16, height 4\' 10"  (1.473 m), weight 47.3 kg, SpO2 93 %.        Intake/Output Summary (Last 24 hours) at 05/28/2019 1444 Last data filed at 05/28/2019 1300 Gross per 24 hour  Intake 240 ml  Output 800 ml  Net -560 ml   Filed Weights   05/26/19 1433 05/26/19 2300  Weight: 49 kg 47.3 kg    Examination:  General - alert Eyes - pupils reactive ENT - no sinus tenderness, no stridor Cardiac - regular rate/rhythm, no murmur Chest - decreased breath sounds, equal breath sounds b/l, no wheezing or rales Abdomen - soft, non tender, + bowel sounds Extremities - no cyanosis, clubbing, or edema Skin - no rashes Neuro - moves extremities, follows commands Psych - normal mood and behavior   Resolved Hospital Problem list     Assessment & Plan:   Advanced COPD/emphysema with chronic hypoxic/hypercapnic respiratory failure on chronic prednisone as  outpt. - goal SpO2 88 to 95% - continue duoneb - continue prednisone - bronchial hygiene  Lt intertrochanteric fracture. - being assessed for surgery with ortho - tentative plan for 12/22  Goals of care. - enrolled in hospice as outpt - DNR if she develops cardiac arrest   Best practice:  Diet: NPO DVT prophylaxis: SCDs GI prophylaxis: not indicated Mobility: bed rest Code Status: DNR Disposition: floor bed  PCCM will f/u after she has surgery.  Call if help needed sooner.  Labs    CMP Latest Ref Rng & Units 05/27/2019 05/26/2019 12/05/2018  Glucose 70 - 99 mg/dL 109(H) 156(H) 107(H)  BUN 8 - 23 mg/dL 8 12 11   Creatinine 0.44 - 1.00 mg/dL 0.42(L) 0.30(L) 0.31(L)  Sodium 135 - 145 mmol/L 135 138 140  Potassium 3.5 - 5.1 mmol/L 4.1 4.0 3.9  Chloride 98 - 111 mmol/L 96(L) 101 98  CO2 22 - 32 mmol/L 31 31 32  Calcium 8.9 - 10.3 mg/dL 9.0 8.4(L) 8.7(L)  Total Protein 6.5 - 8.1 g/dL - 5.2(L) -  Total Bilirubin 0.3 - 1.2 mg/dL - 0.5 -  Alkaline Phos 38 - 126 U/L - 99 -  AST 15 - 41 U/L - 12(L) -  ALT 0 - 44 U/L - 17 -    CBC Latest Ref Rng & Units 05/28/2019 05/27/2019 05/26/2019  WBC 4.0 - 10.5 K/uL 12.6(H) 12.6(H) 11.0(H)  Hemoglobin 12.0 - 15.0 g/dL 11.0(L) 11.1(L) 12.0  Hematocrit 36.0 - 46.0 % 33.5(L) 35.3(L) 39.3  Platelets 150 - 400 K/uL 204 188 181    ABG    Component Value Date/Time   PHART 7.432 05/27/2019 1610   PCO2ART 45.4 05/27/2019 1610   PO2ART 71.5 (L) 05/27/2019 1610   HCO3 30.0 (H) 05/27/2019 1610   TCO2 31 03/31/2016 0758   ACIDBASEDEF 5.2 (H) 11/14/2017 1040   O2SAT 95.4 05/27/2019 [place], MD Alex 05/28/2019, 2:54 PM

## 2019-05-28 NOTE — Plan of Care (Signed)

## 2019-05-28 NOTE — Progress Notes (Signed)
[facility] ([facility]) - GIP RN Note     This a related and covered GIP admission effective 05/26/2019 with a ACC diagnosis of COPD per Dr. Tomasa Hosteller. Daughter contacted on call triage 05/26/2019 indicating patient had fallen and injured her left hip. Called EMS and pt transported ED. Patient is a DNR.   Checked in with Dr. Griffin Basil, who stated "We discussed patient's care at length with this Mrs. Lohmann again.  We specifically went through the high likelihood of perioperative morbidity and mortality as well as extensive recovery and the unlikely situation that she would be able to walk successfully after this.  I very specifically recommended the surgery was primarily for pain control reasons and though she reports she is in very little pain now she wants to take the risk associated with surgery on the small chance that she is able to ambulate well again.  We did again reinforce that and the patient has been so immobile for so long that it is very unlikely that she walks well after this however does not impossible.  She does appear to be somewhat distractible but overall seems to understand the nature of her procedure and the risks and benefits with it.  She is consentable we feel and will discuss this with her family as well but we will plan for surgery with one of my partners tomorrow.  Please keep n.p.o. after midnight".  Spoke with pt's daughter, [name], who stated that as of now, pt is wanting to have a hip replacement. Stated that this could change at any moment but they want to support her wishes. Liaison advised that [facility] hospice will continue to follow while in the hospital and to outreach [facility] with any questions.    V/S: Blood pressure (!) 118/52, pulse 100, temperature 98.3 F (36.8 C), temperature source Oral, resp. rate 16, height 4\' 10"  (1.473 m), weight 47.3 kg, SpO2 93 %.I&O: not documented Abnormal lab work: WBC 12.6, Hgb 11.1, pO2 71.5, Acid-base excess 5.6  IVs/PRNs: fluids  and pain meds    EPIC MD Problem list: Principal Problem:   Closed left hip fracture  -As this patient is on hospice at home, orthopedic surgery is awaiting further discussions ensure that surgery is what the patient and the family want    -Pulmonary critical care team, palliative care team was consulted, extensive discussion with the patient and family was held as she is high risk for surgical intervention. Recommendation from PCCM were discussed with the patient and family in detail.     Active Problems:   COPD (chronic obstructive pulmonary disease) /chronic respiratory failure with hypoxia -Remained stable on 3 L of oxygen -She admits to mild chronic cough but no increasing shortness of breath or wheezing -Continue daily prednisone, DuoNebs and as needed morphine which she uses for dyspnea   Hypertension -Continue metoprolol    D/C planning: will continue to assess needs  - unsure of pt's desire for care after hip replacement if the surgery does take place. Unsure if pt wants rehab.  Goals of Care: Pt wishes to have hip replacement in order to be able to walk again.   Communication with IDT: Updated [place] team  Communication with PCG: Supported/updated patient daughter via telephone. Encouraged to call for assistance as needed.    Please call with any hospice related questions/concerns.  Thank you,  Freddie Breech, RN [facility] Liaison [phone]

## 2019-05-29 ENCOUNTER — Encounter (HOSPITAL_COMMUNITY)
Admission: EM | Disposition: A | Discharge status: Skilled Nursing Facility | Payer: Self-pay | Source: Home / Self Care | Attending: Internal Medicine

## 2019-05-29 ENCOUNTER — Inpatient Hospital Stay (HOSPITAL_COMMUNITY)

## 2019-05-29 ENCOUNTER — Encounter (HOSPITAL_COMMUNITY): Payer: Self-pay | Admitting: Internal Medicine

## 2019-05-29 ENCOUNTER — Inpatient Hospital Stay (HOSPITAL_COMMUNITY): Admitting: Anesthesiology

## 2019-05-29 DIAGNOSIS — I2584 Coronary atherosclerosis due to calcified coronary lesion: Secondary | ICD-10-CM

## 2019-05-29 DIAGNOSIS — I2510 Atherosclerotic heart disease of native coronary artery without angina pectoris: Secondary | ICD-10-CM

## 2019-05-29 DIAGNOSIS — S72002G Fracture of unspecified part of neck of left femur, subsequent encounter for closed fracture with delayed healing: Secondary | ICD-10-CM

## 2019-05-29 HISTORY — PX: INTRAMEDULLARY (IM) NAIL INTERTROCHANTERIC: SHX5875

## 2019-05-29 LAB — CREATININE, SERUM
Creatinine, Ser: 0.53 mg/dL (ref 0.44–1.00)
GFR calc Af Amer: >60 mL/min (ref >60)
GFR calc non Af Amer: >60 mL/min (ref >60)

## 2019-05-29 LAB — CBC
HCT: 32.9 % — ABNORMAL LOW (ref 36.0–46.0)
Hemoglobin: 10.3 g/dL — ABNORMAL LOW (ref 12.0–15.0)
MCH: 27.2 pg (ref 26.0–34.0)
MCHC: 31.3 g/dL (ref 30.0–36.0)
MCV: 87 fL (ref 80.0–100.0)
Platelets: 242 10*3/uL (ref 150–400)
RBC: 3.78 MIL/uL — ABNORMAL LOW (ref 3.87–5.11)
RDW: 15.6 % — ABNORMAL HIGH (ref 11.5–15.5)
WBC: 16.6 10*3/uL — ABNORMAL HIGH (ref 4.0–10.5)
nRBC: 0.1 % (ref 0.0–0.2)

## 2019-05-29 LAB — TYPE AND SCREEN
ABO/RH(D): O NEG
Antibody Screen: NEGATIVE

## 2019-05-29 LAB — MAGNESIUM: Magnesium: 1.8 mg/dL (ref 1.7–2.4)

## 2019-05-29 LAB — ABO/RH: ABO/RH(D): O NEG

## 2019-05-29 LAB — PHOSPHORUS: Phosphorus: 2.8 mg/dL (ref 2.5–4.6)

## 2019-05-29 SURGERY — FIXATION, FRACTURE, INTERTROCHANTERIC, WITH INTRAMEDULLARY ROD
Anesthesia: Spinal | Laterality: Left

## 2019-05-29 MED ORDER — ROCURONIUM BROMIDE 10 MG/ML (PF) SYRINGE
PREFILLED_SYRINGE | INTRAVENOUS | Status: AC
  Filled 2019-05-29: qty 10

## 2019-05-29 MED ORDER — HYDROCODONE-ACETAMINOPHEN 5-325 MG PO TABS
1.0000 | ORAL_TABLET | ORAL | Status: DC | PRN
  Administered 2019-05-29 – 2019-06-01 (×6): 1 via ORAL
  Filled 2019-05-29 (×6): qty 1

## 2019-05-29 MED ORDER — SUGAMMADEX SODIUM 500 MG/5ML IV SOLN
INTRAVENOUS | Status: AC
  Filled 2019-05-29: qty 5

## 2019-05-29 MED ORDER — LACTATED RINGERS IV SOLN: INTRAVENOUS | Status: DC

## 2019-05-29 MED ORDER — FENTANYL CITRATE (PF) 250 MCG/5ML IJ SOLN
INTRAMUSCULAR | Status: AC
  Filled 2019-05-29: qty 5

## 2019-05-29 MED ORDER — PROPOFOL 500 MG/50ML IV EMUL
INTRAVENOUS | Status: DC | PRN
  Administered 2019-05-29: 25 ug/kg/min via INTRAVENOUS

## 2019-05-29 MED ORDER — FENTANYL CITRATE (PF) 100 MCG/2ML IJ SOLN
INTRAMUSCULAR | Status: DC | PRN
  Administered 2019-05-29: 25 ug via INTRAVENOUS

## 2019-05-29 MED ORDER — ENOXAPARIN SODIUM 40 MG/0.4ML ~~LOC~~ SOLN
40.0000 mg | SUBCUTANEOUS | Status: DC
  Administered 2019-05-30 – 2019-06-04 (×6): 40 mg via SUBCUTANEOUS
  Filled 2019-05-29 (×6): qty 0.4

## 2019-05-29 MED ORDER — BUPIVACAINE IN DEXTROSE 0.75-8.25 % IT SOLN
INTRATHECAL | Status: DC | PRN
  Administered 2019-05-29: 1.8 mL via INTRATHECAL

## 2019-05-29 MED ORDER — ENOXAPARIN SODIUM 40 MG/0.4ML ~~LOC~~ SOLN: 40.0000 mg | SUBCUTANEOUS | 0 refills | Status: AC

## 2019-05-29 MED ORDER — PROPOFOL 10 MG/ML IV BOLUS
INTRAVENOUS | Status: AC
  Filled 2019-05-29: qty 20

## 2019-05-29 MED ORDER — CEFAZOLIN SODIUM-DEXTROSE 2-4 GM/100ML-% IV SOLN
2.0000 g | Freq: Four times a day (QID) | INTRAVENOUS | Status: AC
  Administered 2019-05-29 – 2019-05-30 (×2): 2 g via INTRAVENOUS
  Filled 2019-05-29 (×2): qty 100

## 2019-05-29 MED ORDER — SODIUM CHLORIDE 0.9 % IR SOLN
Status: DC | PRN
  Administered 2019-05-29: 1000 mL

## 2019-05-29 MED ORDER — PHENYLEPHRINE 40 MCG/ML (10ML) SYRINGE FOR IV PUSH (FOR BLOOD PRESSURE SUPPORT)
PREFILLED_SYRINGE | INTRAVENOUS | Status: AC
  Filled 2019-05-29: qty 10

## 2019-05-29 MED ORDER — ONDANSETRON HCL 4 MG/2ML IJ SOLN: 4.0000 mg | Freq: Four times a day (QID) | INTRAMUSCULAR | Status: DC | PRN

## 2019-05-29 MED ORDER — PROPOFOL 10 MG/ML IV BOLUS
INTRAVENOUS | Status: DC | PRN
  Administered 2019-05-29: 10 mg via INTRAVENOUS
  Administered 2019-05-29 (×2): 5 mg via INTRAVENOUS

## 2019-05-29 MED ORDER — PHENYLEPHRINE 40 MCG/ML (10ML) SYRINGE FOR IV PUSH (FOR BLOOD PRESSURE SUPPORT)
PREFILLED_SYRINGE | INTRAVENOUS | Status: DC | PRN
  Administered 2019-05-29: 120 ug via INTRAVENOUS
  Administered 2019-05-29: 80 ug via INTRAVENOUS

## 2019-05-29 MED ORDER — HYDROCODONE-ACETAMINOPHEN 5-325 MG PO TABS: 1.0000 | ORAL_TABLET | Freq: Four times a day (QID) | ORAL | 0 refills | Status: DC | PRN

## 2019-05-29 MED ORDER — ONDANSETRON HCL 4 MG PO TABS: 4.0000 mg | ORAL_TABLET | Freq: Four times a day (QID) | ORAL | Status: DC | PRN

## 2019-05-29 MED ORDER — CHLORHEXIDINE GLUCONATE CLOTH 2 % EX PADS
6.0000 | MEDICATED_PAD | Freq: Every day | CUTANEOUS | Status: DC
  Administered 2019-05-31 – 2019-06-04 (×5): 6 via TOPICAL

## 2019-05-29 MED ORDER — ONDANSETRON HCL 4 MG/2ML IJ SOLN
INTRAMUSCULAR | Status: AC
  Filled 2019-05-29: qty 2

## 2019-05-29 MED ORDER — LIDOCAINE 2% (20 MG/ML) 5 ML SYRINGE
INTRAMUSCULAR | Status: AC
  Filled 2019-05-29: qty 5

## 2019-05-29 SURGICAL SUPPLY — 42 items
BIT DRILL AO GAMMA 4.2X130 (BIT) IMPLANT
BIT DRILL AO GAMMA 4.2X180 (BIT) ×2 IMPLANT
CLOSURE STERI-STRIP 1/2X4 (GAUZE/BANDAGES/DRESSINGS) ×1
CLSR STERI-STRIP ANTIMIC 1/2X4 (GAUZE/BANDAGES/DRESSINGS) ×1 IMPLANT
COVER MAYO STAND STRL (DRAPES) ×3 IMPLANT
COVER PERINEAL POST (MISCELLANEOUS) ×3 IMPLANT
COVER SURGICAL LIGHT HANDLE (MISCELLANEOUS) ×3 IMPLANT
DRAPE STERI IOBAN 125X83 (DRAPES) ×3 IMPLANT
DRSG MEPILEX BORDER 4X4 (GAUZE/BANDAGES/DRESSINGS) ×6 IMPLANT
DURAPREP 26ML APPLICATOR (WOUND CARE) ×3 IMPLANT
ELECT REM PT RETURN 9FT ADLT (ELECTROSURGICAL) ×3
ELECTRODE REM PT RTRN 9FT ADLT (ELECTROSURGICAL) ×1 IMPLANT
GLOVE BIO SURGEON STRL SZ7.5 (GLOVE) ×6 IMPLANT
GLOVE BIOGEL PI IND STRL 8 (GLOVE) ×2 IMPLANT
GLOVE BIOGEL PI INDICATOR 8 (GLOVE) ×4
GOWN STRL REUS W/ TWL LRG LVL3 (GOWN DISPOSABLE) ×2 IMPLANT
GOWN STRL REUS W/TWL LRG LVL3 (GOWN DISPOSABLE) ×6
GUIDEROD T2 3X1000 (ROD) ×2 IMPLANT
K-WIRE  3.2X450M STR (WIRE) ×2
K-WIRE 3.2X450M STR (WIRE) ×1
KIT BASIN OR (CUSTOM PROCEDURE TRAY) ×3 IMPLANT
KIT NAIL LONG 10X360MMX125 (Nail) ×2 IMPLANT
KIT TURNOVER KIT B (KITS) ×3 IMPLANT
KWIRE 3.2X450M STR (WIRE) IMPLANT
MANIFOLD NEPTUNE II (INSTRUMENTS) ×3 IMPLANT
NS IRRIG 1000ML POUR BTL (IV SOLUTION) ×3 IMPLANT
PACK GENERAL/GYN (CUSTOM PROCEDURE TRAY) ×3 IMPLANT
PAD ARMBOARD 7.5X6 YLW CONV (MISCELLANEOUS) ×6 IMPLANT
REAMER SHAFT BIXCUT (INSTRUMENTS) ×2 IMPLANT
SCREW LAG GAMMA 3 TI 10.5X90MM (Screw) ×2 IMPLANT
SCREW LOCKING T2 F/T  5X37.5MM (Screw) ×2 IMPLANT
SCREW LOCKING T2 F/T 5X37.5MM (Screw) IMPLANT
SUT MNCRL AB 4-0 PS2 18 (SUTURE) ×2 IMPLANT
SUT VIC AB 0 CT1 27 (SUTURE) ×3
SUT VIC AB 0 CT1 27XBRD ANBCTR (SUTURE) IMPLANT
SUT VIC AB 2-0 CT1 27 (SUTURE) ×3
SUT VIC AB 2-0 CT1 TAPERPNT 27 (SUTURE) IMPLANT
TOWEL GREEN STERILE (TOWEL DISPOSABLE) ×3 IMPLANT
TOWEL OR NON WOVEN STRL DISP B (DISPOSABLE) ×3 IMPLANT
TRAY FOL W/BAG SLVR 16FR STRL (SET/KITS/TRAYS/PACK) IMPLANT
TRAY FOLEY W/BAG SLVR 16FR LF (SET/KITS/TRAYS/PACK) ×3
WATER STERILE IRR 1000ML POUR (IV SOLUTION) ×3 IMPLANT

## 2019-05-29 NOTE — Op Note (Signed)
DATE OF SURGERY:  05/29/2019  TIME: 1:43 PM  PATIENT NAME:  Heather Barnes  AGE: 85 y.o.  PRE-OPERATIVE DIAGNOSIS:  left hip fracture  POST-OPERATIVE DIAGNOSIS:  SAME  PROCEDURE:  INTRAMEDULLARY (IM) NAIL INTERTROCHANTRIC  SURGEON:  Renette Butters  ASSISTANT:  Roxan Hockey, PA-C, he was present and scrubbed throughout the case, critical for completion in a timely fashion, and for retraction, instrumentation, and closure.   OPERATIVE IMPLANTS: [employer] Gamma Nail  PREOPERATIVE INDICATIONS:  [name] is a 85 y.o. year old who fell and suffered a hip fracture. She was brought into the ER and then admitted and optimized and then elected for surgical intervention.    The risks benefits and alternatives were discussed with the patient including but not limited to the risks of nonoperative treatment, versus surgical intervention including infection, bleeding, nerve injury, malunion, nonunion, hardware prominence, hardware failure, need for hardware removal, blood clots, cardiopulmonary complications, morbidity, mortality, among others, and they were willing to proceed.    OPERATIVE PROCEDURE:  The patient was brought to the operating room and placed in the supine position. General anesthesia was administered. She was placed on the fracture table.  Closed reduction was performed under C-arm guidance. Time out was then performed after sterile prep and drape. She received preoperative antibiotics.  Incision was made proximal to the greater trochanter. A guidewire was placed in the appropriate position. Confirmation was made on AP and lateral views. The above-named nail was opened. I opened the proximal femur with a reamer. I then placed the nail by hand easily down. I did not need to ream the femur.  Once the nail was completely seated, I placed a guidepin into the femoral head into the center center position. I measured the length, and then reamed the lateral cortex and up  into the head. I then placed the lag screw. Slight compression was applied. Anatomic fixation achieved. Bone quality was mediocre.  I then secured the proximal interlocking bolt, and took off a half a turn, and then removed the instruments, and took final C-arm pictures AP and lateral the entire length of the leg.  I then used perfect circles technique to place a distal interlock screw.   Anatomic reconstruction was achieved, and the wounds were irrigated copiously and closed with Vicryl followed by staples and sterile gauze for the skin. The patient was awakened and returned to PACU in stable and satisfactory condition. There no complications and the patient tolerated the procedure well.  She will be weightbearing as tolerated, and will be on chemical px  for a period of four weeks after discharge.   Edmonia Lynch, M.D.

## 2019-05-29 NOTE — Anesthesia Procedure Notes (Signed)
Date/Time: 05/29/2019 12:50 PM Performed by: Jenne Campus, CRNA Pre-anesthesia Checklist: Patient identified, Emergency Drugs available, Suction available and Patient being monitored Oxygen Delivery Method: Simple face mask

## 2019-05-29 NOTE — Consult Note (Signed)
   [facility] CM Inpatient Consult   05/29/2019  Runell Kovich [facility] 1933-11-30 [id number]    Patient screened for 25% high risk score for unplanned readmission and hospitalization; and to check for possible needs of [facility]) care management services as a benefit from [employer] insurance plan.   Patient was active with [facility] pharmacist for medication assistance in distant past.   Patient presented to [facility] after a mechanical fall using her walker and was found to have a comminuted intertrochanteric fracture of the left proximal femur. She was later transported to [facility] and admitted for closed fracture of left hip under orthopedic services for further evaluation. Underwent IM (intramedullary nail) intertrochanteric for left hip fracture.  Brief chart review reveals that patient is active with [facility] for hospice at home and they  continue to follow patient while in the hospital.     Plan:  Follow for disposition and needs. If patient remains with Hospice, will sign off as patient will have full care under Hospice. If plan changes, will make outreach attempts.   For questions and referrals, please call:  [name] A. Tamari Busic, BSN, RN-BC [facility] Liaison Cell: [phone]

## 2019-05-29 NOTE — Progress Notes (Signed)
PROGRESS NOTE    [name]   RSW:[id number]  DOB: September 27, 1933  DOA: 05/26/2019 PCP: Orpah Melter, MD   Brief Narrative:  Heather Barnes 85 y.o. female with medical history significant of PVCs, osteoporosis, lung cancer (completed treatment), breast cancer (completed treatment), CAD, arthritis, COPD on 3L - on home hospice for this diagnosis.  She presented after a mechanical fall using her walker.  She notes that she has been in bed for the past 3 weeks due to back spasms and not being able to walk.  She had been feeling weak and has only recently been back up and moving.   In the ED the patient was found to have a comminuted intertrochanteric fracture of the left proximal femur.  Subjective: The patient was seen and examined this morning, remained stable, mildly confused, easily arousable following command. Remained stable on 3 L of oxygen, satting 98%.  Per nursing staff patient had a brief disorientation overnight.  Apparently the patient and the family has come to conclusion agreeable to pursue with surgical invention today. She has been n.p.o. overnight.  Currently stable, no acute distress in bed.  Assessment & Plan:  Principal Problem:  Closed left hip fracture  -As this patient is on hospice at home, orthopedic surgery is awaiting further discussions ensure that surgery is what the patient and the family want   -Pulmonary critical care team, palliative care team was consulted, extensive discussion with the patient and family was held as she is high risk for surgical intervention. Recommendation from PCCM were discussed with the patient and family in detail.  -The patient and family has agreed to pursue with surgical intervention today 05/29/2019.   Active Problems:   COPD (chronic obstructive pulmonary disease) /chronic respiratory failure with hypoxia -Remained stable on 3 L of oxygen -She admits to mild chronic cough but no increasing shortness of  breath or wheezing -Continue daily prednisone, DuoNebs and as needed morphine which she uses for dyspnea  Hypertension -Continue metoprolol  Time spent in minutes: 30 minutes DVT prophylaxis: Heparin Code Status: DO NOT RESUSCITATE Family Communication:  Disposition Plan: Awaiting further decision on surgery Consultants:   Orthopedic surgery Procedures:    Antimicrobials:  Anti-infectives (From admission, onward)   Start     Dose/Rate Route Frequency Ordered Stop   05/29/19 0600  ceFAZolin (ANCEF) IVPB 2g/100 mL premix     2 g 200 mL/hr over 30 Minutes Intravenous On call to O.R. 05/28/19 1518 05/30/19 0559       Objective:  Intake/Output Summary (Last 24 hours) at 05/29/2019 1232 Last data filed at 05/29/2019 0900 Gross per 24 hour  Intake 480 ml  Output 1000 ml  Net -520 ml   Filed Weights   05/26/19 1433 05/26/19 2300  Weight: 49 kg 47.3 kg   Ht 4\' 10"  (1.473 m)   Wt 47.3 kg   SpO2 98%   BMI 21.79 kg/m    BP (!) 126/41 (BP Location: Left Arm) Comment: nurse notified  Pulse (!) 110 Comment: nurse notified  Temp (!) 97.4 F (36.3 C) (Oral) Comment: nurse notified  Resp 16   Ht 4\' 10"  (1.473 m)   Wt 47.3 kg   SpO2 97%   BMI 21.79 kg/m    Physical Exam  Constitution:  Alert, cooperative, no distress, still complaining of a hip pain. HEENT: Normocephalic, PERRL, otherwise with in Normal limits  Chest:Chest symmetric Cardio vascular:  S1/S2, RRR, No murmure, No Rubs or Gallops  pulmonary: Clear to  auscultation bilaterally, respirations unlabored, negative wheezes / crackles Abdomen: Soft, non-tender, non-distended, bowel sounds,no masses, no organomegaly Muscular skeletal: Limited exam - in bed, able to move all 4 extremities, Normal strength,  Neuro: CNII-XII intact. , normal motor and sensation, reflexes intact  Extremities:  Limited range of motion left  hip no pitting edema lower extremities, +2 pulses  Skin: Dry, warm to touch, negative for any  Rashes, No open wounds Wounds: None visible, please see  nursing documentation       Data Reviewed: I have personally reviewed following labs and imaging studies  CBC: Recent Labs  Lab 05/26/19 1520 05/27/19 0734 05/28/19 0522  WBC 11.0* 12.6* 12.6*  NEUTROABS 10.0*  --   --   HGB 12.0 11.1* 11.0*  HCT 39.3 35.3* 33.5*  MCV 89.7 86.3 85.5  PLT 181 188 962   Basic Metabolic Panel: Recent Labs  Lab 05/26/19 1520 05/27/19 0734 05/28/19 0522 05/29/19 0405  NA 138 135  --   --   K 4.0 4.1  --   --   CL 101 96*  --   --   CO2 31 31  --   --   GLUCOSE 156* 109*  --   --   BUN 12 8  --   --   CREATININE 0.30* 0.42*  --   --   CALCIUM 8.4* 9.0  --   --   MG  --   --  2.0 1.8  PHOS  --   --  2.9 2.8   GFR: Estimated Creatinine Clearance: 34.4 mL/min (A) (by C-G formula based on SCr of 0.42 mg/dL (L)). Liver Function Tests: Recent Labs  Lab 05/26/19 1520  AST 12*  ALT 17  ALKPHOS 99  BILITOT 0.5  PROT 5.2*  ALBUMIN 3.2*  Urine analysis:    Component Value Date/Time   COLORURINE YELLOW 06/11/2015 1308   APPEARANCEUR CLOUDY (A) 06/11/2015 1308   LABSPEC 1.018 06/11/2015 1308   PHURINE 6.0 06/11/2015 1308   GLUCOSEU >1000 (A) 06/11/2015 1308   HGBUR TRACE (A) 06/11/2015 1308   BILIRUBINUR NEGATIVE 06/11/2015 1308   KETONESUR NEGATIVE 06/11/2015 1308   PROTEINUR NEGATIVE 06/11/2015 1308   NITRITE NEGATIVE 06/11/2015 1308   LEUKOCYTESUR SMALL (A) 06/11/2015 1308   Sepsis Labs: @LABRCNTIP (procalcitonin:4,lacticidven:4) Recent Results (from the past 240 hour(s))  SARS CORONAVIRUS 2 (TAT 6-24 HRS) Nasopharyngeal Nasopharyngeal Swab     Status: None   Collection Time: 05/26/19  6:09 PM   Specimen: Nasopharyngeal Swab  Result Value Ref Range Status   SARS Coronavirus 2 NEGATIVE NEGATIVE Final    Comment: (NOTE) SARS-CoV-2 target nucleic acids are NOT DETECTED. The SARS-CoV-2 RNA is generally detectable in upper and lower respiratory specimens during the acute  phase of infection. Negative results do not preclude SARS-CoV-2 infection, do not rule out co-infections with other pathogens, and should not be used as the sole basis for treatment or other patient management decisions. Negative results must be combined with clinical observations, patient history, and epidemiological information. The expected result is Negative. Fact Sheet for Patients: SugarRoll.be Fact Sheet for Healthcare Providers: https://www.woods-mathews.com/ This test is not yet approved or cleared by the [place] FDA and  has been authorized for detection and/or diagnosis of SARS-CoV-2 by FDA under an Emergency Use Authorization (EUA). This EUA will remain  in effect (meaning this test can be used) for the duration of the COVID-19 declaration under Section 56 4(b)(1) of the Act, 21 U.S.C. section 360bbb-3(b)(1), unless the authorization is  terminated or revoked sooner. Performed at [facility], [place] [address] [place], [place] [address]   MRSA PCR Screening     Status: None   Collection Time: 05/26/19 10:57 PM   Specimen: Nasal Mucosa; Nasopharyngeal  Result Value Ref Range Status   MRSA by PCR NEGATIVE NEGATIVE Final    Comment:        The GeneXpert MRSA Assay (FDA approved for NASAL specimens only), is one component of a comprehensive MRSA colonization surveillance program. It is not intended to diagnose MRSA infection nor to guide or monitor treatment for MRSA infections. Performed at [facility], [place] [address], [place], [place] [address]          Radiology Studies: No results found.    Scheduled Meds: . [MAR Hold] calcium-vitamin D  1 tablet Oral Daily  . [MAR Hold] cholecalciferol  2,000 Units Oral Daily  . [MAR Hold] feeding supplement (ENSURE ENLIVE)  237 mL Oral BID BM  . [MAR Hold] heparin  5,000 Units Subcutaneous Q8H  . [MAR Hold] ipratropium-albuterol  3 mL Nebulization TID  . [MAR  Hold] metoprolol tartrate  12.5 mg Oral BID  . povidone-iodine  2 application Topical Once  . [MAR Hold] predniSONE  15 mg Oral Q breakfast   Continuous Infusions: .  ceFAZolin (ANCEF) IV    . lactated ringers 10 mL/hr at 05/29/19 1123     LOS: 3 days  Deatra James, MD [facility] Pager: [email or web address] Password [facility] 05/29/2019, 12:32 PM

## 2019-05-29 NOTE — Progress Notes (Signed)
Pt became disoriented during the night. Pt removed all of her clothes and said she had to go to a doctors appointment. After 10-15 minutes of coversating and reorienting pt settled down. Pt still uneasy, but easily reoriented. Will continue to monitor.

## 2019-05-29 NOTE — Progress Notes (Signed)
[place] ([facility]) - GIP RN Note   This a related and covered GIP admission effective 05/26/2019 with a ACC diagnosis of COPD per Dr. Tomasa Hosteller. Daughter contacted on call triage 05/26/2019 indicating patient had fallen and injured her left hip. Called EMS and pt transported ED. Patient is a DNR.  Spoke with son [name], who states patient is still in PACU following her surgery. Son states he spoke with her on the phone and received report from the surgeon. INTRAMEDULLARY (IM) NAIL INTERTROCHANTRIC for left hip fracture.   VS: 97.8, 106/59, 73, 18, 98% 4Lnc I/O: 800/1650  Medications: LR @ 19ml/hr, Tylenol 650mg  PRN @ 1059, 1653, Albuterol Nebulizer PRN1059, 1653, Morphine PRN 2.6mg  1059, 1653, Zofran 72m PRN @ 1059, 1553, Senokot tab PRN @ 0938, 1829  Xray: IMPRESSION: Intraoperative imaging for fixation of a left intertrochanteric fracture. No acute finding.  Per MD notes: Assessment & Plan:  Principal Problem:  Closed left hip fracture  -As this patient is on hospice at home, orthopedic surgery is awaiting further discussions ensure that surgery is what the patient and the family want   -Pulmonary critical care team, palliative care team was consulted, extensive discussion with the patient and family was held as she is high risk for surgical intervention. Recommendation from PCCM were discussed with the patient and family in detail.  -The patient and family has agreed to pursue with surgical intervention today 05/29/2019.   Active Problems:   COPD (chronic obstructive pulmonary disease) /chronic respiratory failure with hypoxia -Remained stable on 3 L of oxygen -She admits to mild chronic cough but no increasing shortness of breath or wheezing -Continue daily prednisone, DuoNebs and as needed morphine which she uses for dyspnea  D/C planning: will continue to assess needs  - unsure of pt's desire for care after hip replacement if the surgery does take  place. Unsure if pt wants rehab. PT will evaluate Goals of Care: Pt wishes to have hip replacement in order to be able to walk again.   Communication with IDT: Updated [facility] team  Communication with PCG: Supported/updated patient son via telephone. Encouraged to call for assistance as needed.   Please call with any hospice related questions/concerns.  Thank you,  Farrel Gordon, RN, [facility] Liaison [phone]

## 2019-05-29 NOTE — Progress Notes (Signed)
Patient alert and oriented x 4 in Short Stay and able to confirm what type of surgery she is having.

## 2019-05-29 NOTE — Anesthesia Postprocedure Evaluation (Signed)
Anesthesia Post Note  Patient: Heather Barnes  Procedure(s) Performed: INTRAMEDULLARY (IM) NAIL INTERTROCHANTRIC (Left )     Patient location during evaluation: PACU Anesthesia Type: Spinal Level of consciousness: oriented and awake and alert Pain management: pain level controlled Vital Signs Assessment: post-procedure vital signs reviewed and stable Respiratory status: spontaneous breathing, respiratory function stable and patient connected to nasal cannula oxygen Cardiovascular status: blood pressure returned to baseline and stable Postop Assessment: no headache, no backache and no apparent nausea or vomiting Anesthetic complications: no    Last Vitals:  Vitals:   05/29/19 1645 05/29/19 1703  BP:  (!) 106/59  Pulse: 95 87  Resp: 19   Temp: 36.6 C 36.6 C  SpO2: 97% 98%    Last Pain:  Vitals:   05/29/19 1703  TempSrc: Oral  PainSc:    Pain Goal: Patients Stated Pain Goal: 3 (05/28/19 2315)                 Heather Barnes

## 2019-05-29 NOTE — Consult Note (Signed)
ORTHOPAEDIC CONSULTATION  REQUESTING PHYSICIAN: Deatra James, MD  Chief Complaint: Left hip fracture  HPI: Heather Barnes is a 85 y.o. female I reviewed and agree with below history Heather Barnes is a 85 y.o. female with medical history significant ofPVCs, osteoporosis, lung cancer (completed treatment), breast cancer (completed treatment), CAD, arthritis, COPD on 3L - on home hospice for this diagnosis. She presented after a fall earlier today. She was using her walker to ambulate. She says she was excited about her brother coming to town, so she was distracted and lost her footing. She normally ambulates short distances using her walker. She is unable to walk longer distances other than room to room because she gets extremely short of breath. She currently lives at home with her daughter. Her daughter helps her with chores and her medications, but would be unable to help transfer/ambulate her as she has significant arthritis and other medical conditions per patient. She denies any injury to her other extremities. Her pain is well controlled if she does not move her left hip.   She had a prolonged hospitalization in June and went to inpatient hospice for 3 weeks after that time. She has home hospice currently. The nurse comes to her house 3 times a week.She is DNR.   Past Medical History:  Diagnosis Date  . Arthritis    "joints" (04/11/2017)  . Asthma   . Breast cancer, right breast ([place])    S/P RIGHT BREAST LUMPECTOMY WITH RADIOACTIVE SEED LOCALIZATION 04/11/2017  . Chronic lower back pain    "if I stand too long" (04/11/2017)  . Colon adenomas   . Complication of anesthesia    Must sit upright 45-60 degree angle per pulmonology  . COPD (chronic obstructive pulmonary disease) ([place])    "severe" (04/11/2017)  . Coronary artery calcification 09/01/2016  . Dysrhythmia    bigeminy  . GERD (gastroesophageal reflux disease)   . Goiter   . History of duodenal ulcer   .  History of radiation therapy 05/24/11-06/02/11   R middle lobe lung/ 60 gray  . Hx of colonic polyps   . Hyperlipidemia   . Lung cancer, middle lobe ([place]) 04/26/2011   S/P radiation 05/24/11-06/02/11  . On home oxygen therapy    "3L; all the time" (04/11/2017)  . Osteoporosis   . Osteoporosis   . Palpitations 01/19/2016   a. event monitor in 01/2016 showed PVC's with one episode of ventricular quadrigeminy.  . Pneumonia 2017 X 2  . Pollen allergy   . PVC (premature ventricular contraction) 02/24/2016  . Shortness of breath   . Wears glasses    Past Surgical History:  Procedure Laterality Date  . APPENDECTOMY    . BIOPSY THYROID  2016  . BREAST BIOPSY Right 03/2017  . BREAST LUMPECTOMY WITH RADIOACTIVE SEED LOCALIZATION Right 04/11/2017  . BREAST LUMPECTOMY WITH RADIOACTIVE SEED LOCALIZATION Right 04/11/2017   Procedure: RIGHT BREAST LUMPECTOMY WITH RADIOACTIVE SEED LOCALIZATION;  Surgeon: Rolm Bookbinder, MD;  Location: [facility];  Service: General;  Laterality: Right;  . CATARACT EXTRACTION W/ INTRAOCULAR LENS  IMPLANT, BILATERAL Bilateral   . COLONOSCOPY W/ BIOPSIES    . DILATION AND CURETTAGE OF UTERUS  "many"  . TONSILLECTOMY    . TOTAL ABDOMINAL HYSTERECTOMY     Social History   Socioeconomic History  . Marital status: Widowed    Spouse name: Not on file  . Number of children: Not on file  . Years of education: Not on file  .  Highest education level: Not on file  Occupational History  . Occupation: retired  Tobacco Use  . Smoking status: Former Smoker    Packs/day: 0.50    Years: 40.00    Pack years: 20.00    Types: Cigarettes    Quit date: 06/07/1998    Years since quitting: 20.9  . Smokeless tobacco: Never Used  Substance and Sexual Activity  . Alcohol use: No  . Drug use: No  . Sexual activity: Not on file  Other Topics Concern  . Not on file  Social History Narrative   On 03/17/11: expressed concerns about harmful effects of XRT based on dtr;s experience  with the same with breast cancer   Social Determinants of Health   Financial Resource Strain:   . Difficulty of Paying Living Expenses: Not on file  Food Insecurity:   . Worried About Charity fundraiser in the Last Year: Not on file  . Ran Out of Food in the Last Year: Not on file  Transportation Needs:   . Lack of Transportation (Medical): Not on file  . Lack of Transportation (Non-Medical): Not on file  Physical Activity:   . Days of Exercise per Week: Not on file  . Minutes of Exercise per Session: Not on file  Stress:   . Feeling of Stress : Not on file  Social Connections:   . Frequency of Communication with Friends and Family: Not on file  . Frequency of Social Gatherings with Friends and Family: Not on file  . Attends Religious Services: Not on file  . Active Member of Clubs or Organizations: Not on file  . Attends Archivist Meetings: Not on file  . Marital Status: Not on file   Family History  Problem Relation Age of Onset  . Emphysema Maternal Uncle   . Heart disease Mother   . Heart disease Maternal Grandfather   . Rheum arthritis Sister   . Cancer Sister   . Cancer Sister        throat cancer  . Brain cancer Sister   . Cancer Sister   . Ovarian cancer Sister   . Breast cancer Daughter    Allergies  Allergen Reactions  . Tiotropium Bromide Monohydrate Hives, Shortness Of Breath and Rash    Spiriva   . Codeine Other (See Comments)    Hyperactivity,crying  . Fosamax [Alendronate] Other (See Comments)    Muscle aches  . Asa [Aspirin] Other (See Comments)    Bleeding -ulcers  . Latex Rash   Prior to Admission medications   Medication Sig Start Date End Date Taking? Authorizing Provider  acetaminophen (TYLENOL) 500 MG tablet Take 500 mg by mouth 3 (three) times daily.   Yes [provider]  cetirizine (ZYRTEC) 10 MG tablet Take 10 mg by mouth daily.     Yes [provider]  haloperidol (HALDOL) 5 MG tablet Take 5 mg by mouth 2  (two) times daily.   Yes [provider]  ipratropium-albuterol (DUONEB) 0.5-2.5 (3) MG/3ML SOLN Take 3 mLs by nebulization 4 (four) times daily. Patient taking differently: Take 3 mLs by nebulization 3 (three) times daily.  12/05/18  Yes Ollis, Brandi L, NP  metoprolol tartrate (LOPRESSOR) 25 MG tablet TAKE 1/2 (ONE-HALF) TABLET BY MOUTH TWICE DAILY Patient taking differently: Take 12.5 mg by mouth 2 (two) times daily.  06/27/18  Yes Skeet Latch, MD  Morphine Sulfate (MORPHINE CONCENTRATE) 10 MG/0.5ML SOLN concentrated solution Take 0.13 mLs (2.6 mg total) by mouth  every 4 (four) hours as needed for severe pain or shortness of breath. 12/05/18  Yes Ollis, Cleaster Corin, NP  Multiple Vitamins-Minerals (PRESERVISION AREDS 2) CAPS Take 1 tablet by mouth 2 (two) times a day.    Yes [provider]  predniSONE (DELTASONE) 20 MG tablet Take 1 tablet (20 mg total) by mouth daily with breakfast. 12/06/18  Yes Ollis, Brandi L, NP  Propylene Glycol (SYSTANE COMPLETE) 0.6 % SOLN Place 1 drop into both eyes 2 (two) times daily.   Yes [provider]  sennosides-docusate sodium (SENOKOT-S) 8.6-50 MG tablet Take 1 tablet by mouth 2 (two) times daily.   Yes [provider]  azelastine (ASTELIN) 0.1 % nasal spray Place 2 sprays into both nostrils 2 (two) times daily. Use in each nostril as directed Patient not taking: Reported on 11/24/2018 11/13/18   Martyn Ehrich, NP  bisacodyl (DULCOLAX) 10 MG suppository Place 1 suppository (10 mg total) rectally daily as needed for moderate constipation. Patient not taking: Reported on 05/26/2019 12/05/18   Donita Brooks, NP  feeding supplement, ENSURE ENLIVE, (ENSURE ENLIVE) LIQD Take 237 mLs by mouth 2 (two) times daily between meals. Patient not taking: Reported on 05/26/2019 12/05/18   Donita Brooks, NP  guaiFENesin (MUCINEX) 600 MG 12 hr tablet Take 1 tablet (600 mg total) by mouth 2 (two) times daily. Patient not taking: Reported on  05/26/2019 12/05/18   Donita Brooks, NP  levofloxacin (LEVAQUIN) 250 MG tablet Take 1 tablet (250 mg total) by mouth daily. Patient not taking: Reported on 05/26/2019 12/06/18   Donita Brooks, NP  OXYGEN Inhale into the lungs. 3L at rest and 4pulse with exertion    [provider]  phenol (CHLORASEPTIC) 1.4 % LIQD Use as directed 1 spray in the mouth or throat as needed for throat irritation / pain. Patient not taking: Reported on 05/26/2019 12/05/18   Donita Brooks, NP   No results found.  Positive ROS: All other systems have been reviewed and were otherwise negative with the exception of those mentioned in the HPI and as above.  Labs cbc Recent Labs    05/27/19 0734 05/28/19 0522  WBC 12.6* 12.6*  HGB 11.1* 11.0*  HCT 35.3* 33.5*  PLT 188 204    Labs inflam No results for input(s): CRP in the last 72 hours.  Invalid input(s): ESR  Labs coag No results for input(s): INR, PTT in the last 72 hours.  Invalid input(s): PT  Recent Labs    05/26/19 1520 05/27/19 0734  NA 138 135  K 4.0 4.1  CL 101 96*  CO2 31 31  GLUCOSE 156* 109*  BUN 12 8  CREATININE 0.30* 0.42*  CALCIUM 8.4* 9.0    Physical Exam: Vitals:   05/28/19 2209 05/29/19 0300  BP: (!) 115/49 (!) 111/49  Pulse:  100  Resp:    Temp:  97.8 F (36.6 C)  SpO2: 97% 91%   General: Alert, no acute distress Cardiovascular: No pedal edema Respiratory: No cyanosis, no use of accessory musculature GI: No organomegaly, abdomen is soft and non-tender Skin: No lesions in the area of chief complaint other than those listed below in MSK exam.  Neurologic: Sensation intact distally save for the below mentioned MSK exam Psychiatric: Patient is competent for consent with normal mood and affect Lymphatic: No axillary or cervical lymphadenopathy  MUSCULOSKELETAL:  Pain with LLE ROM, NVI, compartments soft Other extremities are atraumatic with painless ROM and NVI.  Assessment: Left  hip intertroch  fracture  Plan: I discussed her options with her. She is at high risk for morbidity and mortality with surgical intervention. I made this clear to her and I discussed the procedure with her. She would very much like to go forward with surgical fixation of her L hip.    Renette Butters, MD    05/29/2019 9:29 AM

## 2019-05-29 NOTE — Interval H&P Note (Signed)
I participated in the care of this patient and agree with the above history, physical and evaluation. I performed a review of the history and a physical exam as detailed   Hiro Vipond Daniel Guliana Weyandt MD  

## 2019-05-29 NOTE — Plan of Care (Signed)
  Problem: Safety: Goal: Ability to remain free from injury will improve Outcome: Progressing   

## 2019-05-29 NOTE — Anesthesia Preprocedure Evaluation (Addendum)
Anesthesia Evaluation  Patient identified by MRN, date of birth, ID band Patient awake    Reviewed: Allergy & Precautions, NPO status , Patient's Chart, lab work & pertinent test results  Airway Mallampati: III  TM Distance: >3 FB Neck ROM: Full    Dental no notable dental hx. (+) Missing, Dental Advisory Given,    Pulmonary shortness of breath, asthma , COPD (on 4L, palliative care for severe COPD),  oxygen dependent, former smoker,  NSCLC s/p chemo/radiation   Pulmonary exam normal breath sounds clear to auscultation       Cardiovascular + CAD  Normal cardiovascular exam Rhythm:Regular Rate:Normal     Neuro/Psych negative neurological ROS  negative psych ROS   GI/Hepatic Neg liver ROS, GERD  Medicated,  Endo/Other  negative endocrine ROSOn prednisone 20mg  QD  Renal/GU negative Renal ROS  negative genitourinary   Musculoskeletal  (+) Arthritis ,   Abdominal   Peds  Hematology  (+) Blood dyscrasia (5000U subQ heparin at 0530 this AM), ,   Anesthesia Other Findings   Reproductive/Obstetrics                          Anesthesia Physical Anesthesia Plan  ASA: IV  Anesthesia Plan: Spinal   Post-op Pain Management:    Induction:   PONV Risk Score and Plan: 2 and Treatment may vary due to age or medical condition and Propofol infusion  Airway Management Planned: Natural Airway  Additional Equipment: Arterial line  Intra-op Plan:   Post-operative Plan:   Informed Consent: I have reviewed the patients History and Physical, chart, labs and discussed the procedure including the risks, benefits and alternatives for the proposed anesthesia with the patient or authorized representative who has indicated his/her understanding and acceptance.   Patient has DNR.  Discussed DNR with patient and Suspend DNR.   Dental advisory given  Plan Discussed with: CRNA  Anesthesia Plan Comments:        Anesthesia Quick Evaluation

## 2019-05-29 NOTE — Anesthesia Procedure Notes (Addendum)
Arterial Line Insertion Start/End12/22/2020 11:50 AM, 05/29/2019 11:55 AM Performed by: Wilburn Cornelia, CRNA, CRNA  Preanesthetic checklist: patient identified, IV checked, site marked, risks and benefits discussed, surgical consent, monitors and equipment checked, pre-op evaluation, timeout performed and anesthesia consent Lidocaine 1% used for infiltration and patient sedated Left, radial was placed Catheter size: 20 G Hand hygiene performed  and maximum sterile barriers used  Allen's test indicative of satisfactory collateral circulation Attempts: 1 Procedure performed without using ultrasound guided technique. Following insertion, Biopatch. Post procedure assessment: normal  Patient tolerated the procedure well with no immediate complications.

## 2019-05-29 NOTE — Transfer of Care (Signed)
Immediate Anesthesia Transfer of Care Note  Patient: Heather Barnes  Procedure(s) Performed: INTRAMEDULLARY (IM) NAIL INTERTROCHANTRIC (Left )  Patient Location: PACU  Anesthesia Type:MAC and Spinal  Level of Consciousness: awake, alert , oriented and patient cooperative  Airway & Oxygen Therapy: Patient Spontanous Breathing and Patient connected to face mask oxygen  Post-op Assessment: Report given to RN and Post -op Vital signs reviewed and stable  Post vital signs: Reviewed  Last Vitals:  Vitals Value Taken Time  BP 102/44 05/29/19 1423  Temp    Pulse 95 05/29/19 1424  Resp 16 05/29/19 1424  SpO2 100 % 05/29/19 1424  Vitals shown include unvalidated device data.  Last Pain:  Vitals:   05/29/19 0935  TempSrc: Oral  PainSc:       Patients Stated Pain Goal: 3 (13/08/65 7846)  Complications: No apparent anesthesia complications

## 2019-05-29 NOTE — Anesthesia Procedure Notes (Signed)
Spinal  Patient location during procedure: OR Start time: 05/29/2019 12:40 PM End time: 05/29/2019 12:50 PM Staffing Performed: anesthesiologist  Anesthesiologist: Freddrick March, MD Preanesthetic Checklist Completed: patient identified, IV checked, risks and benefits discussed, surgical consent, monitors and equipment checked, pre-op evaluation and timeout performed Spinal Block Patient position: left lateral decubitus Prep: DuraPrep and site prepped and draped Patient monitoring: cardiac monitor, continuous pulse ox and blood pressure Approach: midline Location: L3-4 Injection technique: single-shot Needle Needle type: Quincke  Needle gauge: 22 G Needle length: 9 cm Assessment Sensory level: T6 Additional Notes Functioning IV was confirmed and monitors were applied. Sterile prep and drape, including hand hygiene and sterile gloves were used. The patient was positioned and the spine was prepped. The skin was anesthetized with lidocaine.  Free flow of clear CSF was obtained prior to injecting local anesthetic into the CSF.  The spinal needle aspirated freely following injection.  The needle was carefully withdrawn.  The patient tolerated the procedure well.

## 2019-05-30 ENCOUNTER — Encounter: Payer: Self-pay | Admitting: *Deleted

## 2019-05-30 DIAGNOSIS — J438 Other emphysema: Secondary | ICD-10-CM

## 2019-05-30 LAB — MAGNESIUM: Magnesium: 1.8 mg/dL (ref 1.7–2.4)

## 2019-05-30 LAB — PHOSPHORUS: Phosphorus: 3.3 mg/dL (ref 2.5–4.6)

## 2019-05-30 MED ORDER — IPRATROPIUM-ALBUTEROL 0.5-2.5 (3) MG/3ML IN SOLN
3.0000 mL | Freq: Two times a day (BID) | RESPIRATORY_TRACT | Status: DC
  Administered 2019-05-30 – 2019-06-04 (×10): 3 mL via RESPIRATORY_TRACT
  Filled 2019-05-30 (×10): qty 3

## 2019-05-30 MED FILL — Fentanyl Citrate Preservative Free (PF) Inj 100 MCG/2ML: INTRAMUSCULAR | Qty: 2 | Status: AC

## 2019-05-30 NOTE — Progress Notes (Signed)
    Subjective: Patient reports pain as moderate, controlled, and improved since surgery.  Not yet mobilized.  Objective:   VITALS:   Vitals:   05/29/19 1938 05/29/19 1942 05/30/19 0342 05/30/19 0728  BP: 118/73  (!) 106/53   Pulse: (!) 102  91 97  Resp: 14  16 16   Temp: 98.4 F (36.9 C)  98.8 F (37.1 C)   TempSrc:      SpO2: 98% 98% 100% 100%  Weight:      Height:       CBC Latest Ref Rng & Units 05/29/2019 05/28/2019 05/27/2019  WBC 4.0 - 10.5 K/uL 16.6(H) 12.6(H) 12.6(H)  Hemoglobin 12.0 - 15.0 g/dL 10.3(L) 11.0(L) 11.1(L)  Hematocrit 36.0 - 46.0 % 32.9(L) 33.5(L) 35.3(L)  Platelets 150 - 400 K/uL 242 204 188   BMP Latest Ref Rng & Units 05/29/2019 05/27/2019 05/26/2019  Glucose 70 - 99 mg/dL - 109(H) 156(H)  BUN 8 - 23 mg/dL - 8 12  Creatinine 0.44 - 1.00 mg/dL 0.53 0.42(L) 0.30(L)  Sodium 135 - 145 mmol/L - 135 138  Potassium 3.5 - 5.1 mmol/L - 4.1 4.0  Chloride 98 - 111 mmol/L - 96(L) 101  CO2 22 - 32 mmol/L - 31 31  Calcium 8.9 - 10.3 mg/dL - 9.0 8.4(L)   Intake/Output      12/22 0701 - 12/23 0700 12/23 0701 - 12/24 0700   P.O. 0    I.V. (mL/kg) 800 (16.9)    Total Intake(mL/kg) 800 (16.9)    Urine (mL/kg/hr) 2100 (1.8)    Blood 50    Total Output 2150    Net -1350             Physical Exam: General: NAD.  Upright in bed.  Calm, conversant.  No increased work of breathing.  O2 in place.   MSK LLE: Neurovascularly intact Sensation intact distally Feet warm Dorsiflexion/Plantar flexion intact Incision: dressings C/D/I   Assessment: 1 Day Post-Op  S/P Procedure(s) (LRB): INTRAMEDULLARY (IM) NAIL INTERTROCHANTRIC (Left) by Dr. Ernesta Amble. Percell Miller on 05/29/2019  Principal Problem:   Closed left hip fracture ([place]) Active Problems:   COPD (chronic obstructive pulmonary disease) ([facility])   Chronic respiratory failure with hypoxia ([facility])   Coronary artery calcification   Fall   Palliative care by specialist   Closed left intertrochanteric femur  fracture, status post IM nail Doing well postop day 1 Pain controlled Not yet mobilized  Plan: Up with therapy Incentive Spirometry Apply ice PRN  Weightbearing: WBAT LLE Insicional and dressing care: Dressings left intact until follow-up Showering: Keep dressing dry VTE prophylaxis: Lovenox, SCDs, ambulation Pain control: Minimize narcotics.  Continue current regimen. Follow - up plan: 2 weeks Contact information:  Edmonia Lynch MD, Roxan Hockey PA-C  Dispo:  TBD.  Post-op Therapy evaluations pending.  Discharge when mobilized and ready medically.  Prudencio Burly III, PA-C 05/30/2019, 7:49 AM

## 2019-05-30 NOTE — Progress Notes (Signed)
Patient bladder scanned, 297 cc, passed on to oncoming shift. Patient has not voided since this morning.

## 2019-05-30 NOTE — Evaluation (Signed)
Physical Therapy Evaluation Patient Details Name: Heather Barnes MRN: [id number] DOB: [birth year] Today's Date: 05/30/2019   History of Present Illness  Pt is 85 y.o. female with medical history significant of PVCs, osteoporosis, lung cancer (completed treatment), breast cancer (completed treatment), CAD, arthritis, COPD on 3L - on home hospice at home for COPD.  She presented after a mechanical fall using her walker with L intertrochanteric fracture of L proximal femur with extension into subtrochanteric femur.  She is s/p IM nail on 05/29/19 with WBAT status.  Clinical Impression   Patient is s/p above surgery on POD #1 resulting in functional limitations due to the deficits listed below (see PT Problem List). Pt was able to transfer to EOB with max A x 2 and to chair with min A x 2. She did have some shortness of breath, but O2 sats were stable.  Pt was at home with hospice due to COPD but is motivated to improve her mobility.   Patient will benefit from skilled PT to increase their independence and safety with mobility to allow discharge to the venue listed below.       Follow Up Recommendations SNF    Equipment Recommendations  Other (comment)(TBD next venue)    Recommendations for Other Services       Precautions / Restrictions Precautions Precautions: Fall Precaution Comments: Monitor O2 Restrictions Weight Bearing Restrictions: Yes LLE Weight Bearing: Weight bearing as tolerated      Mobility  Bed Mobility Overal bed mobility: Needs Assistance Bed Mobility: Supine to Sit     Supine to sit: Max assist;+2 for physical assistance     General bed mobility comments: assist for L LE and to boost trunk; limited due to pain  Transfers Overall transfer level: Needs assistance Equipment used: Rolling walker (2 wheeled) Transfers: Sit to/from [facility] Sit to Stand: +2 physical assistance;Min assist Stand pivot transfers: Min assist;+2 physical  assistance       General transfer comment: cued for foot placement and safe hand placement; min A of to to boost up and pivot to chair  Ambulation/Gait             General Gait Details: unable to take steps; pivoted to chair  Stairs            Wheelchair Mobility    Modified Rankin (Stroke Patients Only)       Balance Overall balance assessment: Needs assistance Sitting-balance support: Bilateral upper extremity supported;Feet supported Sitting balance-Leahy Scale: Fair     Standing balance support: During functional activity;Bilateral upper extremity supported Standing balance-Leahy Scale: Poor                               Pertinent Vitals/Pain Pain Assessment: Faces Faces Pain Scale: Hurts little more Pain Location: L hip: no pain rest; grimaced with movement Pain Descriptors / Indicators: Grimacing;Guarding Pain Intervention(s): Limited activity within patient's tolerance;Premedicated before session;Repositioned    Home Living Family/patient expects to be discharged to:: Skilled nursing facility Living Arrangements: Children Available Help at Discharge: Family;Available 24 hours/day Type of Home: House Home Access: Stairs to enter Entrance Stairs-Rails: Right Entrance Stairs-Number of Steps: 4 Home Layout: One level Home Equipment: Walker - 4 wheels;Wheelchair - manual Additional Comments: on O2 at home    Prior Function Level of Independence: Needs assistance   Gait / Transfers Assistance Needed: reports typically does not use AD but had been having back spasms so was  using rollator when she fell;  Walks household distances  ADL's / Homemaking Assistance Needed: Pt able to do toielting and dressing but reports assist with bathing  Comments: Had hospice at home 3 days a week to assist with ADLs     Hand Dominance   Dominant Hand: Right    Extremity/Trunk Assessment   Upper Extremity Assessment Upper Extremity Assessment:  Generalized weakness    Lower Extremity Assessment Lower Extremity Assessment: Defer to PT evaluation RLE Deficits / Details: WFL ROM AND MMT LLE Deficits / Details: ROM WFL; MMT ankle 5/5, knee and hip 1/5 limited by pain       Communication   Communication: HOH  Cognition Arousal/Alertness: Awake/alert Behavior During Therapy: WFL for tasks assessed/performed Overall Cognitive Status: Within Functional Limits for tasks assessed                                        General Comments General comments (skin integrity, edema, etc.): Pt on 3 LPM O2 with stable O2 sats, but did get short of breath with activity    Exercises     Assessment/Plan    PT Assessment Patient needs continued PT services  PT Problem List Decreased strength;Decreased mobility;Decreased safety awareness;Decreased range of motion;Decreased activity tolerance;Cardiopulmonary status limiting activity;Decreased balance;Decreased knowledge of use of DME;Pain       PT Treatment Interventions DME instruction;Therapeutic activities;Gait training;Therapeutic exercise;Patient/family education;Modalities;Balance training;Functional mobility training    PT Goals (Current goals can be found in the Care Plan section)  Acute Rehab PT Goals Patient Stated Goal: open to short term rehab at d/c PT Goal Formulation: With patient Time For Goal Achievement: 06/13/19 Potential to Achieve Goals: Fair    Frequency Min 3X/week   Barriers to discharge Decreased caregiver support needs physical assist    Co-evaluation PT/OT/SLP Co-Evaluation/Treatment: Yes Reason for Co-Treatment: For patient/therapist safety;To address functional/ADL transfers PT goals addressed during session: Mobility/safety with mobility;Strengthening/ROM;Proper use of DME;Balance OT goals addressed during session: ADL's and self-care;Proper use of Adaptive equipment and DME       AM-PAC PT "6 Clicks" Mobility  Outcome Measure Help  needed turning from your back to your side while in a flat bed without using bedrails?: A Lot Help needed moving from lying on your back to sitting on the side of a flat bed without using bedrails?: Total Help needed moving to and from a bed to a chair (including a wheelchair)?: A Lot Help needed standing up from a chair using your arms (e.g., wheelchair or bedside chair)?: A Lot Help needed to walk in hospital room?: A Lot Help needed climbing 3-5 steps with a railing? : Total 6 Click Score: 10    End of Session Equipment Utilized During Treatment: Gait belt;Oxygen Activity Tolerance: Patient tolerated treatment well Patient left: with chair alarm set;in chair;with call bell/phone within reach(feet elevated and ice pack in place) Nurse Communication: Mobility status PT Visit Diagnosis: Unsteadiness on feet (R26.81);Other abnormalities of gait and mobility (R26.89);Muscle weakness (generalized) (M62.81);History of falling (Z91.81)    Time: 9381-8299 PT Time Calculation (min) (ACUTE ONLY): 25 min   Charges:   PT Evaluation $PT Eval Moderate Complexity: 1 Mod          Maggie Font, PT Acute Rehab Services Pager [phone] [facility] [phone] [facility] [phone]   Karlton Lemon 05/30/2019, 2:11 PM

## 2019-05-30 NOTE — Progress Notes (Signed)
PMT provider follow-up and chart reviewed. Patient stable following surgery. Pending PT consult. Current Manufacturing [employer] hospice patient. Spoke with hospice liaison, Venia Carbon. Family waiting on recommendations from PT but likely will decide to take her back home with hospice support. [facility] hospice liaison will continue to follow inpatient. No further needs from PMT. Thank you.   NO CHARGE  Ihor Dow, [place], FNP-C Palliative Medicine Team  Phone: [phone] Fax: [phone]

## 2019-05-30 NOTE — Progress Notes (Signed)
   NAME:  Heather Barnes, MRN:  [id number], DOB:  [birth year], LOS: 4 ADMISSION DATE:  05/26/2019, CONSULTATION DATE:  05/27/2019 REFERRING MD:  Dr. Roger Shelter, [facility], CHIEF COMPLAINT:  Hip pain   Brief History   85 yo female former smoker followed by Dr. Chase Caller for advanced COPD and chronic hypoxic/hypercapnic respiratory failure on 4 liters O2 and NSCLC s/p XRT and enrolled in home hospice fell at home.  Found to have comminuted intertrochanteric fracture of Lt proximal femur.  PCCM consulted to assess respiratory status prior to surgery.  Past Medical History  Osteoporosis, Breast cancer, CAD, PNA, HLD, Colon polyps, Duodenal ulcer, GERD  Significant Hospital Events   12/19 Admit  Consults:  Ortho  Procedures:  12/22 INTRAMEDULLARY (IM) NAIL INTERTROCHANTRIC with spinal anesthesia  Significant Diagnostic Tests:    Micro Data:  SARS Cov2 12/19 >> negative  Antimicrobials:  Cefazolin 12/22 SCIP   Interim history/subjective:  Doing well post op. On 3lpm O2. Able to perform IS. Pt states she is motivated to start rehab. Awaiting PT recs.   Objective   Blood pressure (!) 106/53, pulse 97, temperature 98.8 F (37.1 C), resp. rate 16, height 4\' 10"  (1.473 m), Barnes 47.3 kg, SpO2 100 %.        Intake/Output Summary (Last 24 hours) at 05/30/2019 1039 Last data filed at 05/30/2019 0177 Gross per 24 hour  Intake 800 ml  Output 1550 ml  Net -750 ml   Filed Weights   05/26/19 1433 05/26/19 2300  Barnes: 49 kg 47.3 kg    Examination:  General: thin, elderly lady, well-developed, well nourished. NAD HENT: Normocephalic, PERRL. Moist mucus membranes Neck: No JVD. Trachea midline.  CV: RRR. S1S2. No MRG. +2 distal pulses Lungs: BBS diminished bases, otherwise clear, FNL, symmetrical ABD: +BS x4. SNT/ND. No masses, guarding or rigidity GU: No Foley EXT: MAE well. No edema Skin: PWD. In tact. No rashes or lesions Neuro: A&Ox3. CN II-XII in tact. No focal  deficits Psych: Appropriate mood, insight and judgment for time and situation      Resolved Hospital Problem list     Assessment & Plan:   Advanced COPD/emphysema with chronic hypoxic/hypercapnic respiratory failure on chronic prednisone as outpt. On her 3lpm O2 - goal SpO2 88 to 95% - continue duoneb - continue prednisone - bronchial hygiene, add flutter valve   Lt intertrochanteric fracture S/P L IM nail intertrochantric with spinal anesthesia Goals of care. - enrolled in hospice as outpt-following  - DNR if she develops cardiac arrest   Best practice:  Diet: per primary  DVT prophylaxis: SCDs, enoxaparin GI prophylaxis: not indicated Mobility: PT Code Status: DNR Disposition: floor bed. Home with hospice vs rehab pending PT recs and pt/family decision.   PCCM will sign off. Thank you for the opportunity to participate in this patient's care. Please contact if we can be of further assistance.  Francine Graven, MSN, AGACNP  [facility]

## 2019-05-30 NOTE — Evaluation (Signed)
Occupational Therapy Evaluation Patient Details Name: Heather Barnes MRN: [id number] DOB: 1934-01-17 Today's Date: 05/30/2019    History of Present Illness Pt is 85 y.o. female with medical history significant of PVCs, osteoporosis, lung cancer (completed treatment), breast cancer (completed treatment), CAD, arthritis, COPD on 3L - on home hospice at home for SOPD.  She presented after a mechanical fall using her walker with L intertrochanteric fracture of L proximal femur with extension into subtrochanteric femur.  She is s/p IM nail on 05/29/19 with WBAT status.   Clinical Impression   Pt with decline in function and safety with ADLs and ADL mobility with impaired strength, balance ane endurance. Pt limited by pain and anticipation of pain; fearful of falling. PTA,pt lived at home with her daughter and has hospice assist for some ADLs. Pt states that she bathes in bed, sitting up or in shower depending on how she feels on a particular day but that she could dress and toilet herself. Pt reports that she uses a rollater for mobility. Pt seemed to be open to post acute rehab. Pt currently requires min guard A for UB ADLs, max - total A with LB ADLs, max A +2 for be mobility, total A with toileting and min A + 2 for mobility using RW. Pt would benefit from acute OT services to address impairments to maximize level of function and safety    Follow Up Recommendations  SNF;Supervision/Assistance - 24 hour;Other (comment)(pt/family may refuse SNF)    Equipment Recommendations  Other (comment)(TBD)    Recommendations for Other Services       Precautions / Restrictions Precautions Precautions: Fall Precaution Comments: Monitor O2 Restrictions Weight Bearing Restrictions: Yes LLE Weight Bearing: Weight bearing as tolerated      Mobility Bed Mobility Overal bed mobility: Needs Assistance Bed Mobility: Supine to Sit     Supine to sit: Max assist;+2 for physical assistance     General  bed mobility comments: assist for L LE and to boost trunk; limited due to pain  Transfers Overall transfer level: Needs assistance Equipment used: Rolling walker (2 wheeled) Transfers: Sit to/from [facility] Sit to Stand: +2 physical assistance;Min assist Stand pivot transfers: Min assist;+2 physical assistance       General transfer comment: cued for foot placement and safe hand placement; min A of to to boost up and pivot to chair    Balance Overall balance assessment: Needs assistance Sitting-balance support: Bilateral upper extremity supported;Feet supported Sitting balance-Leahy Scale: Fair     Standing balance support: During functional activity;Bilateral upper extremity supported Standing balance-Leahy Scale: Poor                             ADL either performed or assessed with clinical judgement   ADL Overall ADL's : Needs assistance/impaired Eating/Feeding: Set up;Independent;Sitting   Grooming: Wash/dry hands;Wash/dry face;Min guard;Sitting   Upper Body Bathing: Min guard;Sitting   Lower Body Bathing: Maximal assistance   Upper Body Dressing : Min guard;Sitting   Lower Body Dressing: Total assistance   Toilet Transfer: Minimal assistance;+2 for safety/equipment;RW;Stand-pivot Toilet Transfer Details (indicate cue type and reason): simulated to recliner Toileting- Clothing Manipulation and Hygiene: Total assistance       Functional mobility during ADLs: Minimal assistance;+2 for physical assistance;Cueing for safety;Rolling walker       Vision Baseline Vision/History: Wears glasses Patient Visual Report: No change from baseline       Perception  Praxis      Pertinent Vitals/Pain Pain Assessment: Faces Faces Pain Scale: Hurts little more Pain Location: L hip: no pain rest; grimaced with movement Pain Descriptors / Indicators: Grimacing;Guarding Pain Intervention(s): Limited activity within patient's  tolerance;Premedicated before session;Repositioned     Hand Dominance Right   Extremity/Trunk Assessment Upper Extremity Assessment Upper Extremity Assessment: Generalized weakness   Lower Extremity Assessment Lower Extremity Assessment: Defer to PT evaluation RLE Deficits / Details: WFL ROM AND MMT LLE Deficits / Details: ROM WFL; MMT ankle 5/5, knee and hip 1/5 limited by pain       Communication Communication Communication: HOH   Cognition Arousal/Alertness: Awake/alert Behavior During Therapy: WFL for tasks assessed/performed Overall Cognitive Status: Within Functional Limits for tasks assessed                                     General Comments  Pt on 3 LPM O2 with stable O2 sats, but did get short of breath with activity    Exercises     Shoulder Instructions      Home Living Family/patient expects to be discharged to:: Skilled nursing facility Living Arrangements: Children Available Help at Discharge: Family;Available 24 hours/day Type of Home: House Home Access: Stairs to enter [employer] of Steps: 4 Entrance Stairs-Rails: Right Home Layout: One level     Bathroom Shower/Tub: Teacher, early years/pre: Standard     Home Equipment: Environmental consultant - 4 wheels;Wheelchair - manual   Additional Comments: on O2 at home      Prior Functioning/Environment Level of Independence: Needs assistance  Gait / Transfers Assistance Needed: reports typically does not use AD but had been having back spasms so was using rollator when she fell;  Walks household distances ADL's / Homemaking Assistance Needed: Pt able to do toielting and dressing but reports assist with bathing   Comments: Had hospice at home 3 days a week to assist with ADLs        OT Problem List: Decreased strength;Impaired balance (sitting and/or standing);Pain;Decreased activity tolerance      OT Treatment/Interventions: Self-care/ADL training;DME and/or AE  instruction;Therapeutic activities;Therapeutic exercise;Patient/family education    OT Goals(Current goals can be found in the care plan section) Acute Rehab OT Goals Patient Stated Goal: open to short term rehab at d/c OT Goal Formulation: With patient Time For Goal Achievement: 06/13/19 Potential to Achieve Goals: Good ADL Goals Pt Will Perform Grooming: with supervision;with set-up;with caregiver independent in assisting;sitting Pt Will Perform Upper Body Bathing: with supervision;with set-up;sitting;with caregiver independent in assisting Pt Will Perform Upper Body Dressing: with supervision;with set-up;sitting;with caregiver independent in assisting Pt Will Transfer to Toilet: with min assist;ambulating;stand pivot transfer;bedside commode Additional ADL Goal #1: Pt will complete bed mobility mod A to sit EOB for ADLs/selfcare and in prep for transfers  OT Frequency: Min 2X/week   Barriers to D/C:    has 24/7 sup from family and assist from hospice with ADLs at baseline       Co-evaluation PT/OT/SLP Co-Evaluation/Treatment: Yes Reason for Co-Treatment: For patient/therapist safety;To address functional/ADL transfers PT goals addressed during session: Mobility/safety with mobility;Strengthening/ROM;Proper use of DME;Balance OT goals addressed during session: ADL's and self-care;Proper use of Adaptive equipment and DME      AM-PAC OT "6 Clicks" Daily Activity     Outcome Measure Help from another person eating meals?: None Help from another person taking care of personal grooming?: A Little  Help from another person toileting, which includes using toliet, bedpan, or urinal?: Total Help from another person bathing (including washing, rinsing, drying)?: A Lot Help from another person to put on and taking off regular upper body clothing?: A Little Help from another person to put on and taking off regular lower body clothing?: Total 6 Click Score: 14   End of Session Equipment  Utilized During Treatment: Gait belt;Rolling walker Nurse Communication: Mobility status  Activity Tolerance: No increased pain;Other (comment)(fearful of falling) Patient left: in chair;with call bell/phone within reach;with chair alarm set  OT Visit Diagnosis: Unsteadiness on feet (R26.81);Other abnormalities of gait and mobility (R26.89);Muscle weakness (generalized) (M62.81);History of falling (Z91.81);Pain Pain - Right/Left: Left Pain - part of body: Hip;Leg                Time: 9753-0051 OT Time Calculation (min): 26 min Charges:  OT General Charges $OT Visit: 1 Visit OT Evaluation $OT Eval Moderate Complexity: 1 Mod    Britt Bottom 05/30/2019, 2:16 PM

## 2019-05-30 NOTE — Progress Notes (Signed)
PROGRESS NOTE  Heather Barnes XBD:[id number] DOB: [birth year] DOA: 05/26/2019 PCP: Orpah Melter, MD  Brief Narrative:  Heather Barnes 85 y.o.femalewith medical history significant ofPVCs, osteoporosis, lung cancer (completed treatment), breast cancer (completed treatment), CAD, arthritis, COPD on 3L - on home hospice for this diagnosis. She presented after a mechanical fall using her walker. She notes that she has been in bed for the past 3 weeks due to back spasms and not being able to walk. She had been feeling weak and has only recently been back up and moving.  In the ED the patient was found to have a comminuted intertrochanteric fracture of the left proximal femur.    HPI/Recap of past 24 hours:  Post op day 1, no acute overnight event  Assessment/Plan: Principal Problem:   Closed left hip fracture (HCC) Active Problems:   COPD (chronic obstructive pulmonary disease) (HCC)   Chronic respiratory failure with hypoxia (HCC)   Coronary artery calcification   Fall   Palliative care by specialist  Closed left hip fracture INTRAMEDULLARY (IM) NAIL INTERTROCHANTRIC (Left) by Dr. Ernesta Amble. Murphy on 05/29/2019 Weightbearing: WBAT LLE Insicional and dressing care: Dressings left intact until follow-up Showering: Keep dressing dry VTE prophylaxis: Lovenox, SCDs, ambulation Follow-up with Dr. Percell Miller in 2 weeks   Advanced COPD/emphysema with chronic hypoxic/hypercapnic respiratory failure on chronic prednisone as outpt. On her 3lpm O2, on chronic prednisone 20 mg daily On home hospice -She is stable, appreciate pulmonology input.  Hypertension Stable on home medication Lopressor 12.5 mg twice a day  DVT Prophylaxis: Lovenox  Code Status: DNR  Family Communication: patient , son over the phone  Disposition Plan: SNF with palliative care versus home with home hospice   Consultants:  Orthopedics  Palliative  care  Hospice  Pulmonology  Procedures: INTRAMEDULLARY (IM) NAIL INTERTROCHANTRIC (Left) by Dr. Ernesta Amble. Murphy on 05/29/2019  Antibiotics:  Operative Ancef   Objective: BP (!) 106/53 (BP Location: Left Arm)   Pulse 97   Temp 98.8 F (37.1 C)   Resp 16   Ht 4\' 10"  (1.473 m)   Wt 47.3 kg   SpO2 100%   BMI 21.79 kg/m   Intake/Output Summary (Last 24 hours) at 05/30/2019 1551 Last data filed at 05/30/2019 8341 Gross per 24 hour  Intake --  Output 500 ml  Net -500 ml   Filed Weights   05/26/19 1433 05/26/19 2300  Weight: 49 kg 47.3 kg    Exam: Patient is examined daily including today on 05/30/2019, exams remain the same as of yesterday except that has changed    General: Frail , chronically ill ,NAD, hard of hearing  Cardiovascular: RRR  Respiratory: CTABL  Abdomen: Soft/ND/NT, positive BS  Musculoskeletal: Left hip postop changes, dressing intact, neurovascular intact distally  Neuro: alert, oriented x3  Data Reviewed: Basic Metabolic Panel: Recent Labs  Lab 05/26/19 1520 05/27/19 0734 05/28/19 0522 05/29/19 0405 05/29/19 1828 05/30/19 0428  NA 138 135  --   --   --   --   K 4.0 4.1  --   --   --   --   CL 101 96*  --   --   --   --   CO2 31 31  --   --   --   --   GLUCOSE 156* 109*  --   --   --   --   BUN 12 8  --   --   --   --  CREATININE 0.30* 0.42*  --   --  0.53  --   CALCIUM 8.4* 9.0  --   --   --   --   MG  --   --  2.0 1.8  --  1.8  PHOS  --   --  2.9 2.8  --  3.3   Liver Function Tests: Recent Labs  Lab 05/26/19 1520  AST 12*  ALT 17  ALKPHOS 99  BILITOT 0.5  PROT 5.2*  ALBUMIN 3.2*   No results for input(s): LIPASE, AMYLASE in the last 168 hours. No results for input(s): AMMONIA in the last 168 hours. CBC: Recent Labs  Lab 05/26/19 1520 05/27/19 0734 05/28/19 0522 05/29/19 1828  WBC 11.0* 12.6* 12.6* 16.6*  NEUTROABS 10.0*  --   --   --   HGB 12.0 11.1* 11.0* 10.3*  HCT 39.3 35.3* 33.5* 32.9*  MCV 89.7  86.3 85.5 87.0  PLT 181 188 204 242   Cardiac Enzymes:   No results for input(s): CKTOTAL, CKMB, CKMBINDEX, TROPONINI in the last 168 hours. BNP (last 3 results) Recent Labs    11/24/18 0452  BNP 119.0*    ProBNP (last 3 results) No results for input(s): PROBNP in the last 8760 hours.  CBG: No results for input(s): GLUCAP in the last 168 hours.  Recent Results (from the past 240 hour(s))  SARS CORONAVIRUS 2 (TAT 6-24 HRS) Nasopharyngeal Nasopharyngeal Swab     Status: None   Collection Time: 05/26/19  6:09 PM   Specimen: Nasopharyngeal Swab  Result Value Ref Range Status   SARS Coronavirus 2 NEGATIVE NEGATIVE Final    Comment: (NOTE) SARS-CoV-2 target nucleic acids are NOT DETECTED. The SARS-CoV-2 RNA is generally detectable in upper and lower respiratory specimens during the acute phase of infection. Negative results do not preclude SARS-CoV-2 infection, do not rule out co-infections with other pathogens, and should not be used as the sole basis for treatment or other patient management decisions. Negative results must be combined with clinical observations, patient history, and epidemiological information. The expected result is Negative. Fact Sheet for Patients: SugarRoll.be Fact Sheet for Healthcare Providers: https://www.woods-mathews.com/ This test is not yet approved or cleared by the [place] FDA and  has been authorized for detection and/or diagnosis of SARS-CoV-2 by FDA under an Emergency Use Authorization (EUA). This EUA will remain  in effect (meaning this test can be used) for the duration of the COVID-19 declaration under Section 56 4(b)(1) of the Act, 21 U.S.C. section 360bbb-3(b)(1), unless the authorization is terminated or revoked sooner. Performed at [facility], [place] [address] [place], [place] [address]   MRSA PCR Screening     Status: None   Collection Time: 05/26/19 10:57 PM   Specimen: Nasal  Mucosa; Nasopharyngeal  Result Value Ref Range Status   MRSA by PCR NEGATIVE NEGATIVE Final    Comment:        The GeneXpert MRSA Assay (FDA approved for NASAL specimens only), is one component of a comprehensive MRSA colonization surveillance program. It is not intended to diagnose MRSA infection nor to guide or monitor treatment for MRSA infections. Performed at [facility], [place] [address], [place], [place] [address]      Studies: No results found.  Scheduled Meds: . calcium-vitamin D  1 tablet Oral Daily  . Chlorhexidine Gluconate Cloth  6 each Topical Daily  . cholecalciferol  2,000 Units Oral Daily  . enoxaparin (LOVENOX) injection  40 mg Subcutaneous Q24H  .  feeding supplement (ENSURE ENLIVE)  237 mL Oral BID BM  . ipratropium-albuterol  3 mL Nebulization BID  . metoprolol tartrate  12.5 mg Oral BID  . predniSONE  15 mg Oral Q breakfast    Continuous Infusions: . lactated ringers 50 mL/hr at 05/29/19 2230     Time spent: 25mins I have personally reviewed and interpreted on  05/30/2019 daily labs,  imagings as discussed above under date review session and assessment and plans.  I reviewed all nursing notes, pharmacy notes, consultant notes,  vitals, pertinent old records  I have discussed plan of care as described above with RN , patient and family on 05/30/2019   Florencia Reasons MD, PhD, FACP  [facility] [email or web address], password [facility] 05/30/2019, 3:51 PM  LOS: 4 days

## 2019-05-30 NOTE — Progress Notes (Signed)
[facility] ([facility]) - GIP RN Note @ 1000 am  This a related and covered GIP admission effective 05/26/2019 with a terminal diagnosis of COPD per Dr. Tomasa Hosteller. Daughter contacted on call triage 05/26/2019 indicating patient had fallen and injured her left hip. Called EMS and pt transported ED. Patient is a DNR.  Pt to OR on 12/22 and had a left IM nailing. She has not been evaluated by PT yet.  Pain is currently managed with PO vicodin.  Long discussion with both children, [name] and [name].  Pt lives at home with [name] who is her primary care taker.  [name] states prior to fall, she was mostly in bed.  Her current PPS was 30% prior to fall and current hospitalization, mostly in bed and required assistance with walker and her daughter for mobilization.  Both son and daughter do not want her to go to a facility, they want her to return home.    V/S:  98.8 oral, 106/53, HR 91, RR 16, SPO2 100% 4 lpm via [place] I&O:  800/2150 Lab work:  WBC 16.6, RBC 3.78, HGB 10.3, hematocrit 32.9 Diagnostics:  DG femur following nailing-Intraoperative imaging for fixation of a left intertrochanteric fracture. No acute finding. IVs/PRNs:  LR @ 50 ml/hr, ancef 2 G IV q6h, vicodin 5/325 mg PO x 2  Problem List Left hip fracture- IM nailing on 12/22, waiting to be mobilized, pain is controlled with PO medications  Discharge Planning- ongoing, family would like her to return home.  They are undecided if they would like to pursue rehab.  They do not feel she has much to rehab "to". GOC- ongoing, dependent upon recommendations of PT.  Family feels they want to bring her back home with hospice, but would like to hear recommendations from PT. IDT- updated Family- updated  Venia Carbon RN, BSN, [facility] Liaison (in [place]) [phone]

## 2019-05-30 NOTE — Care Management Important Message (Signed)
Important Message  Patient Details  Name: Heather Barnes MRN: [id number] Date of Birth: 13-Aug-1933   Medicare Important Message Given:  Yes     Orbie Pyo 05/30/2019, 2:48 PM

## 2019-05-31 DIAGNOSIS — J438 Other emphysema: Secondary | ICD-10-CM

## 2019-05-31 LAB — SARS CORONAVIRUS 2 (TAT 6-24 HRS): SARS Coronavirus 2: NEGATIVE

## 2019-05-31 MED ORDER — IPRATROPIUM-ALBUTEROL 0.5-2.5 (3) MG/3ML IN SOLN
RESPIRATORY_TRACT | Status: AC
  Filled 2019-05-31: qty 3

## 2019-05-31 NOTE — Plan of Care (Signed)

## 2019-05-31 NOTE — NC FL2 (Signed)
[place] LEVEL OF CARE SCREENING TOOL     IDENTIFICATION  Patient Name: Heather Barnes Birthdate: [birth year] Sex: female Admission Date (Current Location): 05/26/2019  [facility] and [place] Number:  Herbalist and Address:  [facility], [place] [address] [place], [place] [address]      Provider Number: [id number]  Attending Physician Name and Address:  Florencia Reasons, MD  Relative Name and Phone Number:  Dyasia Firestine (son) [phone]    Current Level of Care: Hospital Recommended Level of Care: [place] Prior Approval Number:    Date Approved/Denied:   PASRR Number: [id number]  Discharge Plan: SNF    Current Diagnoses: Patient Active Problem List   Diagnosis Date Noted  . Closed left hip fracture ([place]) 05/27/2019  . Palliative care by specialist   . Fall   . SOB (shortness of breath)   . Encounter for palliative care   . Goals of care, counseling/discussion   . Shortness of breath   . Acute on chronic respiratory failure with hypoxia and hypercapnia ([place]) 11/30/2018  . Sinusitis 12/12/2017  . 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 Coronary artery calcification 09/01/2016  . Bilateral low back pain with sciatica 05/10/2016  . Bronchopneumonia 05/10/2016  . CAP (community acquired pneumonia) 03/31/2016  . Chest pain 03/31/2016  . Tachycardia 03/31/2016  . PVC (premature ventricular contraction) 02/24/2016  . Palpitations 01/19/2016  . Respiratory failure ([place]) 06/11/2015  . Acute on chronic respiratory failure ([place]) 06/11/2015  . Knee pain, right 07/02/2014  . Chronic respiratory failure with hypoxia ([place]) 07/02/2014  . COPD, very severe ([place]) 07/02/2014  . COPD exacerbation ([place]) 06/13/2013  . Right rib fracture 05/15/2012  . Fatigue 07/22/2011  . Oral thrush 07/22/2011  . Lung cancer, middle lobe ([place]) 04/26/2011  . COPD (chronic obstructive pulmonary disease) ([place])  01/13/2011    Orientation RESPIRATION BLADDER Height & Weight     Self, Time, Situation, Place  O2(O2 continuous at 3LPM) External catheter, Continent Weight: 47.3 kg Height:  4\' 10"  (147.3 cm)  BEHAVIORAL SYMPTOMS/MOOD NEUROLOGICAL BOWEL NUTRITION STATUS  Other (Comment)(N/A) (N/A) Continent Diet(see discharge summary)  AMBULATORY STATUS COMMUNICATION OF NEEDS Skin   Extensive Assist Verbally Surgical wounds(left hip incision)                       Personal Care Assistance Level of Assistance  Bathing, Feeding, Dressing Bathing Assistance: Maximum assistance Feeding assistance: Limited assistance Dressing Assistance: Maximum assistance     Functional Limitations Info  Sight, Hearing, Speech Sight Info: Impaired Hearing Info: Impaired Speech Info: Adequate    SPECIAL CARE FACTORS FREQUENCY  PT (By licensed PT), OT (By licensed OT)     PT Frequency: Min 3X/week OT Frequency: Min 2X/week            Contractures Contractures Info: Not present    Additional Factors Info  Code Status, Allergies Code Status Info: DNR Allergies Info: Tiotropium Bromide Monohydrate, Codeine, ASA, Latex, Fosamax           Current Medications (05/31/2019):  This is the current hospital active medication list Current Facility-Administered Medications  Medication Dose Route Frequency Provider Last Rate Last Admin  . acetaminophen (TYLENOL) tablet 650 mg  650 mg Oral Q4H PRN Prudencio Burly III, PA-C   650 mg at 05/30/19 2055  . albuterol (PROVENTIL) (2.5 MG/3ML) 0.083% nebulizer solution 2.5 mg  2.5 mg Nebulization Q6H PRN Martensen,  Charna Elizabeth III, PA-C      . calcium-vitamin D (OSCAL WITH D) 500-200 MG-UNIT per tablet 1 tablet  1 tablet Oral Daily Prudencio Burly III, PA-C   1 tablet at 05/31/19 [phone]  . Chlorhexidine Gluconate Cloth 2 % PADS 6 each  6 each Topical Daily Renette Butters, MD   6 each at 05/31/19 [phone]  . cholecalciferol (VITAMIN D3) tablet 2,000  Units  2,000 Units Oral Daily Prudencio Burly III, PA-C   2,000 Units at 05/31/19 6546  . enoxaparin (LOVENOX) injection 40 mg  40 mg Subcutaneous Q24H Prudencio Burly III, PA-C   40 mg at 05/31/19 5035  . feeding supplement (ENSURE ENLIVE) (ENSURE ENLIVE) liquid 237 mL  237 mL Oral BID BM Prudencio Burly III, PA-C   237 mL at 05/31/19 0953  . HYDROcodone-acetaminophen (NORCO/VICODIN) 5-325 MG per tablet 1 tablet  1 tablet Oral Q4H PRN Prudencio Burly III, PA-C   1 tablet at 05/31/19 [phone]  . ipratropium-albuterol (DUONEB) 0.5-2.5 (3) MG/3ML nebulizer solution 3 mL  3 mL Nebulization BID Florencia Reasons, MD   3 mL at 05/31/19 0741  . lactated ringers infusion   Intravenous Continuous Prudencio Burly III, PA-C   Stopped at 05/30/19 2059  . metoprolol tartrate (LOPRESSOR) tablet 12.5 mg  12.5 mg Oral BID Prudencio Burly III, PA-C   12.5 mg at 05/31/19 8127  . morphine CONCENTRATE 10 MG/0.5ML oral solution 2.6 mg  2.6 mg Oral Q4H PRN Prudencio Burly III, PA-C   2.6 mg at 05/28/19 2316  . ondansetron (ZOFRAN) tablet 4 mg  4 mg Oral Q6H PRN Prudencio Burly III, PA-C       Or  . ondansetron [facility]) injection 4 mg  4 mg Intravenous Q6H PRN Prudencio Burly III, PA-C      . ondansetron [facility]) tablet 4 mg  4 mg Oral Q6H PRN Prudencio Burly III, PA-C       Or  . ondansetron [facility]) injection 4 mg  4 mg Intravenous Q6H PRN Prudencio Burly III, PA-C      . predniSONE (DELTASONE) tablet 15 mg  15 mg Oral Q breakfast Prudencio Burly III, PA-C   15 mg at 05/31/19 0948  . senna-docusate (Senokot-S) tablet 1 tablet  1 tablet Oral QHS PRN Prudencio Burly III, PA-C   1 tablet at 05/30/19 2137     Discharge Medications: Please see discharge summary for a list of discharge medications.  Relevant Imaging Results:  Relevant Lab Results:   Additional Information SSN: [id number]; Hospice services will be revoked  while at SNF will need to be followed by [place] MSN, RN, NCM-BC, ACM-RN [phone]

## 2019-05-31 NOTE — Plan of Care (Signed)
  Problem: Pain Managment: Goal: General experience of comfort will improve Outcome: Progressing   Problem: Safety: Goal: Ability to remain free from injury will improve Outcome: Progressing   Problem: Skin Integrity: Goal: Risk for impaired skin integrity will decrease Outcome: Progressing   

## 2019-05-31 NOTE — TOC Initial Note (Signed)
Transition of Care [facility]) - Initial/Assessment Note    Patient Details  Name: Heather Barnes MRN: [id number] Date of Birth: Oct 20, 1933  [facility]) CM/SW Contact:    Midge Minium MSN, RN, NCM-BC, ACM-RN [phone] Phone Number: 05/31/2019, 9:12 AM  Clinical Narrative:                 CM following for dispositional needs. CM spoke to the patients son, Celenia Hruska to discuss the POC. Patient is an 85 y.o. female with medical history significant of PVCs, osteoporosis, lung cancer (completed treatment), breast cancer (completed treatment), CAD, arthritis, COPD on 3L - on home hospice with [facility]. Patient presented after a fall with a L intertrochanteric fracture of L proximal femur with extension into subtrochanteric femur; s/p IM nail 12/22. Patient lived at home with her daughter, with hospice at home 3 days a week to assist with ADLs. PT/OT eval completed with [facility] SNF recommended. CM discussed the SNF process vs transitioning home with [facility] services-explained hospice will have to be revoked while receiving rehab at a SNF or home with [facility] services, with the son verbalizing understanding. CM provided son with a [place] list; son undecided on DCP at this time, requesting to discuss further with the patient, family and [facility] rep. CM team will continue to follow and complete an FL2/[place] MUST screen for dual planning.   Expected Discharge Plan: Skilled Nursing Facility Barriers to Discharge: Continued Medical Work up, SNF Pending bed offer   Patient Goals and CMS Choice   CMS Medicare.gov Compare Post Acute Care list provided to:: Patient Represenative (must comment)(Michael Holloman (son)) Choice offered to / list presented to : Adult Children(Michael Whidbee (son))  Expected Discharge Plan and Services Expected Discharge Plan: [place] In-house Referral: NA Discharge Planning Services: CM Consult   Living arrangements for the past 2  months: Single Family Home                 DME Arranged: N/A DME Agency: NA       HH Arranged: NA HH Agency: NA        Prior Living Arrangements/Services Living arrangements for the past 2 months: [place] with:: Self, Adult Children Patient language and need for interpreter reviewed:: Yes Do you feel safe going back to the place where you live?: Yes      Need for Family Participation in Patient Care: Yes (Comment) Care giver support system in place?: Yes (comment) Current home services: Hospice, DME Criminal Activity/Legal Involvement Pertinent to Current Situation/Hospitalization: No - Comment as needed  Activities of Daily Living Home Assistive Devices/Equipment: Oxygen, Shower chair with back, Wheelchair, Nebulizer(walker broke when patient fell) ADL Screening (condition at time of admission) Patient's cognitive ability adequate to safely complete daily activities?: Yes Is the patient deaf or have difficulty hearing?: Yes Does the patient have difficulty seeing, even when wearing glasses/contacts?: Yes Does the patient have difficulty concentrating, remembering, or making decisions?: No Patient able to express need for assistance with ADLs?: Yes Does the patient have difficulty dressing or bathing?: Yes Independently performs ADLs?: No Communication: Dependent Is this a change from baseline?: Change from baseline, expected to last >3 days Dressing (OT): Dependent Is this a change from baseline?: Change from baseline, expected to last >3 days Feeding: Needs assistance Is this a change from baseline?: Change from baseline, expected to last >3 days Bathing: Dependent Is this a change from baseline?: Change from baseline, expected to last >  3 days Toileting: Dependent Is this a change from baseline?: Change from baseline, expected to last >3days In/Out Bed: Dependent Is this a change from baseline?: Change from baseline, expected to last >3 days Walks in  Home: Dependent Is this a change from baseline?: Change from baseline, expected to last >3 days Does the patient have difficulty walking or climbing stairs?: Yes Weakness of Legs: Left Weakness of Arms/Hands: None  Permission Sought/Granted Permission sought to share information with : Case Manager, Chartered certified accountant granted to share information with : Yes, Verbal Permission Granted     Permission granted to share info w AGENCY: [facility]/SNF facilities for DCP needs        Emotional Assessment       Orientation: : Oriented to Self, Oriented to Place, Oriented to  Time, Oriented to Situation Alcohol / Substance Use: Not Applicable Psych Involvement: No (comment)  Admission diagnosis:  Hip fracture ([place]) [S72.009A] Closed fracture of left hip, initial encounter ([place]) [S72.002A] Fall, initial encounter [W19.XXXA] Patient Active Problem List   Diagnosis Date Noted  . Closed left hip fracture ([place]) 05/27/2019  . Palliative care by specialist   . Fall   . SOB (shortness of breath)   . Encounter for palliative care   . Goals of care, counseling/discussion   . Shortness of breath   . Acute on chronic respiratory failure with hypoxia and hypercapnia ([place]) 11/30/2018  . Sinusitis 12/12/2017  . 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 Coronary artery calcification 09/01/2016  . Bilateral low back pain with sciatica 05/10/2016  . Bronchopneumonia 05/10/2016  . CAP (community acquired pneumonia) 03/31/2016  . Chest pain 03/31/2016  . Tachycardia 03/31/2016  . PVC (premature ventricular contraction) 02/24/2016  . Palpitations 01/19/2016  . Respiratory failure ([place]) 06/11/2015  . Acute on chronic respiratory failure ([place]) 06/11/2015  . Knee pain, right 07/02/2014  . Chronic respiratory failure with hypoxia ([place]) 07/02/2014  . COPD, very severe ([place]) 07/02/2014  . COPD exacerbation ([place]) 06/13/2013  . Right rib fracture 05/15/2012   . Fatigue 07/22/2011  . Oral thrush 07/22/2011  . Lung cancer, middle lobe ([place]) 04/26/2011  . COPD (chronic obstructive pulmonary disease) ([place]) 01/13/2011   PCP:  Orpah Melter, MD Pharmacy:   [id number] [address], [place] [address] Phone: [phone] Fax: [phone]     Social Determinants of Health (SDOH) Interventions    Readmission Risk Interventions Readmission Risk Prevention Plan 05/30/2019 12/04/2018 11/27/2018  Transportation Screening Complete Complete Complete  PCP or Specialist Appt within 3-5 Days - - Not Complete  Not Complete comments - - not yet ready for d/c  Home Care Screening - Complete -  [place] or [place] Complete - Complete  Social Work Consult for [place] Planning/Counseling Complete - Complete  Palliative Care Screening Complete - Not Applicable  Medication Review Press photographer) Complete - Complete  Some recent data might be hidden

## 2019-05-31 NOTE — Progress Notes (Signed)
Physical Therapy Treatment Patient Details Name: Heather Barnes MRN: [id number] DOB: [birth year] Today's Date: 05/31/2019    History of Present Illness Pt is 85 y.o. female with medical history significant of PVCs, osteoporosis, lung cancer (completed treatment), breast cancer (completed treatment), CAD, arthritis, COPD on 3L - on home hospice at home for SOPD.  She presented after a mechanical fall using her walker with L intertrochanteric fracture of L proximal femur with extension into subtrochanteric femur.  She is s/p IM nail on 05/29/19 with WBAT status.    PT Comments    Pt was seen for therapy with transfer training and ROM to the L hip with mild pain elicited.  Pt demonstrates a struggle to stand with TDWB on LLE, and instead of using a RW was assisted to the chair with PT directly.  Follow up with her acutely to increase her control of standing balance, and work with her on gait skills with WB as permitted to shorten her stay in SNF.   Follow Up Recommendations  SNF     Equipment Recommendations  Other (comment)    Recommendations for Other Services       Precautions / Restrictions Precautions Precautions: Fall Precaution Comments: Monitor O2 Restrictions Weight Bearing Restrictions: Yes LLE Weight Bearing: Touchdown weight bearing    Mobility  Bed Mobility Overal bed mobility: Needs Assistance Bed Mobility: Supine to Sit     Supine to sit: Mod assist     General bed mobility comments: lifted LLE and assisted to scoot to EOB  Transfers Overall transfer level: Needs assistance Equipment used: Rolling walker (2 wheeled) Transfers: Sit to/from [facility] Sit to Stand: Mod assist;From elevated surface Stand pivot transfers: Mod assist       General transfer comment: used direct assist as pt could not keep TDWB onLLE  Ambulation/Gait                 Stairs             Wheelchair Mobility    Modified Rankin (Stroke  Patients Only)       Balance Overall balance assessment: History of Falls;Needs assistance Sitting-balance support: Bilateral upper extremity supported;Feet supported Sitting balance-Leahy Scale: Fair     Standing balance support: Bilateral upper extremity supported;During functional activity Standing balance-Leahy Scale: Poor                              Cognition Arousal/Alertness: Awake/alert Behavior During Therapy: WFL for tasks assessed/performed Overall Cognitive Status: Within Functional Limits for tasks assessed                                        Exercises General Exercises - Lower Extremity Ankle Circles/Pumps: AROM;5 reps Hip ABduction/ADduction: AAROM;10 reps    General Comments General comments (skin integrity, edema, etc.): pt requires help for transfers, increased pain on LLE with any active movement      Pertinent Vitals/Pain Pain Assessment: Faces Faces Pain Scale: Hurts little more Pain Location: L hip with movement off bed Pain Descriptors / Indicators: Grimacing;Guarding Pain Intervention(s): Limited activity within patient's tolerance;Monitored during session;Repositioned;Patient requesting pain meds-RN notified    Home Living                      Prior Function  PT Goals (current goals can now be found in the care plan section) Progress towards PT goals: Progressing toward goals    Frequency    Min 3X/week      PT Plan Current plan remains appropriate    Co-evaluation              AM-PAC PT "6 Clicks" Mobility   Outcome Measure  Help needed turning from your back to your side while in a flat bed without using bedrails?: A Lot Help needed moving from lying on your back to sitting on the side of a flat bed without using bedrails?: A Lot Help needed moving to and from a bed to a chair (including a wheelchair)?: A Lot Help needed standing up from a chair using your arms (e.g.,  wheelchair or bedside chair)?: Total Help needed to walk in hospital room?: Total Help needed climbing 3-5 steps with a railing? : Total 6 Click Score: 9    End of Session Equipment Utilized During Treatment: Gait belt;Oxygen Activity Tolerance: Patient tolerated treatment well Patient left: with chair alarm set;in chair;with call bell/phone within reach Nurse Communication: Mobility status PT Visit Diagnosis: Unsteadiness on feet (R26.81);Other abnormalities of gait and mobility (R26.89);Muscle weakness (generalized) (M62.81);History of falling (Z91.81)     Time: 8921-1941 PT Time Calculation (min) (ACUTE ONLY): 27 min  Charges:  $Therapeutic Activity: 23-37 mins                    Ramond Dial 05/31/2019, 9:03 PM   Mee Hives, PT MS Acute Rehab Dept. Number: [place] [facility] [place]

## 2019-05-31 NOTE — Care Management (Signed)
CM spoke to the patients son, Laelia Angelo, the family has decided to pursue a SNF for ST rehab. Preferences:   1. [facility] 2. [facility] (declined d/t no bed offers) 3. [facility]  4. [facility] 5. [facility] faxed the referrals to the selected facilities; awaiting bed offers.   Midge Minium MSN, RN, NCM-BC, ACM-RN [phone]

## 2019-05-31 NOTE — Progress Notes (Signed)
    Subjective: Patient reports pain as moderate, controlled, and improved since surgery. She was able to transfer out of her bed into her recliner. She has no complaints.   Objective:   VITALS:   Vitals:   05/30/19 1956 05/30/19 2115 05/31/19 0741 05/31/19 0838  BP:  (!) 125/55  (!) 125/50  Pulse:  100 92 99  Resp:  17 18 17   Temp:  98.2 F (36.8 C)  98.6 F (37 C)  TempSrc:  Oral  Oral  SpO2: 98% 98% 98% 100%  Weight:      Height:       CBC Latest Ref Rng & Units 05/29/2019 05/28/2019 05/27/2019  WBC 4.0 - 10.5 K/uL 16.6(H) 12.6(H) 12.6(H)  Hemoglobin 12.0 - 15.0 g/dL 10.3(L) 11.0(L) 11.1(L)  Hematocrit 36.0 - 46.0 % 32.9(L) 33.5(L) 35.3(L)  Platelets 150 - 400 K/uL 242 204 188   BMP Latest Ref Rng & Units 05/29/2019 05/27/2019 05/26/2019  Glucose 70 - 99 mg/dL - 109(H) 156(H)  BUN 8 - 23 mg/dL - 8 12  Creatinine 0.44 - 1.00 mg/dL 0.53 0.42(L) 0.30(L)  Sodium 135 - 145 mmol/L - 135 138  Potassium 3.5 - 5.1 mmol/L - 4.1 4.0  Chloride 98 - 111 mmol/L - 96(L) 101  CO2 22 - 32 mmol/L - 31 31  Calcium 8.9 - 10.3 mg/dL - 9.0 8.4(L)   Intake/Output      12/23 0701 - 12/24 0700 12/24 0701 - 12/25 0700   P.O. 480    I.V. (mL/kg) 939 (19.9)    Total Intake(mL/kg) 1419 (30)    Urine (mL/kg/hr) 250 (0.2)    Blood     Total Output 250    Net +1169         Urine Occurrence 1 x        Physical Exam: General: NAD.  Upright in bed.  Calm, conversant.  No increased work of breathing.  O2 in place.   MSK Left lower extremity: incision CDI, tender to palpation about the left hip, reports pain with ROM of hip, intact EHL/TA/GSC, sensation intact distally, leg lengths equal, warm well perfused foot   Assessment: 2 Days Post-Op  S/P Procedure(s) (LRB): INTRAMEDULLARY (IM) NAIL INTERTROCHANTRIC (Left) by Dr. Ernesta Amble. Percell Miller on 05/29/2019  Principal Problem:   Closed left hip fracture ([place]) Active Problems:   COPD (chronic obstructive pulmonary disease) (HCC)   Chronic  respiratory failure with hypoxia (HCC)   Coronary artery calcification   Fall   Palliative care by specialist   Closed left intertrochanteric femur fracture, status post IM nail Doing well postop day 2 Pain controlled Worked with PT/OT yesterday  Plan: Up with therapy Incentive Spirometry Apply ice PRN  Weightbearing: WBAT LLE Insicional and dressing care: Dressings left intact until follow-up Showering: Keep dressing dry VTE prophylaxis: Lovenox, SCDs, ambulation Pain control: Minimize narcotics.  Continue current regimen. Follow - up plan: 2 weeks Contact information:  Edmonia Lynch MD, Roxan Hockey PA-C  Dispo:  Family has decided to pursue a SNF for ST rehab, awaiting bed offers. Case Manager following. Discharge once cleared medically.   Ethelda Chick, PA-C 05/31/2019, 12:25 PM

## 2019-05-31 NOTE — Progress Notes (Addendum)
PROGRESS NOTE  Heather Barnes ERX:[id number] DOB: 1934/05/16 DOA: 05/26/2019 PCP: Orpah Melter, MD  Brief Narrative:  Heather Barnes 85 y.o.femalewith medical history significant ofPVCs, osteoporosis, lung cancer (completed treatment), breast cancer (completed treatment), CAD, arthritis, COPD on 3L - on home hospice for this diagnosis. She presented after a mechanical fall using her walker. She notes that she has been in bed for the past 3 weeks due to back spasms and not being able to walk. She had been feeling weak and has only recently been back up and moving.  In the ED the patient was found to have a comminuted intertrochanteric fracture of the left proximal femur.    HPI/Recap of past 24 hours:  Post op day 2, no acute overnight event  Assessment/Plan: Principal Problem:   Closed left hip fracture (HCC) Active Problems:   COPD (chronic obstructive pulmonary disease) (HCC)   Chronic respiratory failure with hypoxia (HCC)   Coronary artery calcification   Fall   Palliative care by specialist  Closed left hip fracture INTRAMEDULLARY (IM) NAIL INTERTROCHANTRIC (Left) by Dr. Ernesta Amble. Murphy on 05/29/2019 Weightbearing: WBAT LLE Insicional and dressing care: Dressings left intact until follow-up Showering: Keep dressing dry VTE prophylaxis: Lovenox, SCDs, ambulation Follow-up with Dr. 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 On her 3lpm O2, on chronic prednisone 20 mg daily On home hospice -She is stable, appreciate pulmonology input.  Hypertension Stable on home medication Lopressor 12.5 mg twice a day  DVT Prophylaxis: Lovenox  Code Status: DNR  Family Communication: patient ,   Disposition Plan: family decide on SNF placement, repeat covid testing ordered   Consultants:  Orthopedics  Palliative  care  Hospice  Pulmonology  Procedures: INTRAMEDULLARY (IM) NAIL INTERTROCHANTRIC (Left) by Dr. Ernesta Amble. Murphy on 05/29/2019  Antibiotics:  Operative Ancef   Objective: BP (!) 125/50   Pulse 99   Temp 98.6 F (37 C) (Oral)   Resp 17   Ht 4\' 10"  (1.473 m)   Wt 47.3 kg   SpO2 100%   BMI 21.79 kg/m   Intake/Output Summary (Last 24 hours) at 05/31/2019 1251 Last data filed at 05/31/2019 0300 Gross per 24 hour  Intake 1178.98 ml  Output 250 ml  Net 928.98 ml   Filed Weights   05/26/19 1433 05/26/19 2300  Weight: 49 kg 47.3 kg    Exam: Patient is examined daily including today on 05/31/2019, exams remain the same as of yesterday except that has changed    General: Frail , chronically ill ,NAD, hard of hearing  Cardiovascular: RRR  Respiratory: CTABL  Abdomen: Soft/ND/NT, positive BS  Musculoskeletal: Left hip postop changes, dressing intact, neurovascular intact distally  Neuro: alert, oriented x3  Data Reviewed: Basic Metabolic Panel: Recent Labs  Lab 05/26/19 1520 05/27/19 0734 05/28/19 0522 05/29/19 0405 05/29/19 1828 05/30/19 0428  NA 138 135  --   --   --   --   K 4.0 4.1  --   --   --   --   CL 101 96*  --   --   --   --   CO2 31 31  --   --   --   --   GLUCOSE 156* 109*  --   --   --   --   BUN 12 8  --   --   --   --   CREATININE 0.30* 0.42*  --   --  0.53  --   CALCIUM 8.4* 9.0  --   --   --   --   MG  --   --  2.0 1.8  --  1.8  PHOS  --   --  2.9 2.8  --  3.3   Liver Function Tests: Recent Labs  Lab 05/26/19 1520  AST 12*  ALT 17  ALKPHOS 99  BILITOT 0.5  PROT 5.2*  ALBUMIN 3.2*   No results for input(s): LIPASE, AMYLASE in the last 168 hours. No results for input(s): AMMONIA in the last 168 hours. CBC: Recent Labs  Lab 05/26/19 1520 05/27/19 0734 05/28/19 0522 05/29/19 1828  WBC 11.0* 12.6* 12.6* 16.6*  NEUTROABS 10.0*  --   --   --   HGB 12.0 11.1* 11.0* 10.3*  HCT 39.3 35.3* 33.5* 32.9*  MCV 89.7 86.3 85.5  87.0  PLT 181 188 204 242   Cardiac Enzymes:   No results for input(s): CKTOTAL, CKMB, CKMBINDEX, TROPONINI in the last 168 hours. BNP (last 3 results) Recent Labs    11/24/18 0452  BNP 119.0*    ProBNP (last 3 results) No results for input(s): PROBNP in the last 8760 hours.  CBG: No results for input(s): GLUCAP in the last 168 hours.  Recent Results (from the past 240 hour(s))  SARS CORONAVIRUS 2 (TAT 6-24 HRS) Nasopharyngeal Nasopharyngeal Swab     Status: None   Collection Time: 05/26/19  6:09 PM   Specimen: Nasopharyngeal Swab  Result Value Ref Range Status   SARS Coronavirus 2 NEGATIVE NEGATIVE Final    Comment: (NOTE) SARS-CoV-2 target nucleic acids are NOT DETECTED. The SARS-CoV-2 RNA is generally detectable in upper and lower respiratory specimens during the acute phase of infection. Negative results do not preclude SARS-CoV-2 infection, do not rule out co-infections with other pathogens, and should not be used as the sole basis for treatment or other patient management decisions. Negative results must be combined with clinical observations, patient history, and epidemiological information. The expected result is Negative. Fact Sheet for Patients: SugarRoll.be Fact Sheet for Healthcare Providers: https://www.woods-mathews.com/ This test is not yet approved or cleared by the [place] FDA and  has been authorized for detection and/or diagnosis of SARS-CoV-2 by FDA under an Emergency Use Authorization (EUA). This EUA will remain  in effect (meaning this test can be used) for the duration of the COVID-19 declaration under Section 56 4(b)(1) of the Act, 21 U.S.C. section 360bbb-3(b)(1), unless the authorization is terminated or revoked sooner. Performed at [facility], [place] [address] [place], [place] [address]   MRSA PCR Screening     Status: None   Collection Time: 05/26/19 10:57 PM   Specimen: Nasal Mucosa;  Nasopharyngeal  Result Value Ref Range Status   MRSA by PCR NEGATIVE NEGATIVE Final    Comment:        The GeneXpert MRSA Assay (FDA approved for NASAL specimens only), is one component of a comprehensive MRSA colonization surveillance program. It is not intended to diagnose MRSA infection nor to guide or monitor treatment for MRSA infections. Performed at [facility], [place] [address], [place], [place] [address]      Studies: No results found.  Scheduled Meds: . calcium-vitamin D  1 tablet Oral Daily  . Chlorhexidine Gluconate Cloth  6 each Topical Daily  . cholecalciferol  2,000 Units Oral Daily  . enoxaparin (LOVENOX) injection  40 mg Subcutaneous Q24H  . feeding supplement (ENSURE ENLIVE)  237 mL  Oral BID BM  . ipratropium-albuterol  3 mL Nebulization BID  . metoprolol tartrate  12.5 mg Oral BID  . predniSONE  15 mg Oral Q breakfast    Continuous Infusions: . lactated ringers Stopped (05/30/19 2059)     Time spent: 24mins I have personally reviewed and interpreted on  05/31/2019 daily labs,  imagings as discussed above under date review session and assessment and plans.  I reviewed all nursing notes, pharmacy notes, consultant notes,  vitals, pertinent old records  I have discussed plan of care as described above with RN , patient  on 05/31/2019   Florencia Reasons MD, PhD, FACP  [facility] [email or web address], password [facility] 05/31/2019, 12:51 PM  LOS: 5 days

## 2019-05-31 NOTE — Progress Notes (Signed)
[facility] 5N07 - [facility] ([facility]) - GIP RN Note @ 0930 am  This a related and covered GIP admission on 05/26/2019 for hip fracture. Daughter contacted on call triage 05/26/2019 indicating patient had fallen and injured her left hip. Called EMS and pt transported ED. Pt is under hospice services with a terminal diagnosis of COPD per Dr. Tomasa Hosteller.  Patient has an out of facility DNR.  Family has decided that pt will need inpatient rehab.  Initial plan was to return home with [facility] services, but children feel that due to her current state and the state of her daughter (primary caregiver who is disabled) facility placement would be best to give the patient and her daughter a break.  Son is currently reviewing facility options with the rest of the family.  Pt pain in adequately managed with PO vicodin.  She mobilized with PT yesterday, was able to pivot to chair with assistance, and was eager to work with PT.  V/S:  98.6 oral, 125/50, HR 99, RR 17, SPO2 100% on [place] 2 lpm I&O:  1179/250 Lab work:  None Diagnostics:  No new IVs/PRNs:  Finished IV infusion last night, has only needed vicodin 5/325 PO x 1  Problem List Left hip fracture- IM nailing on 12/22, mobilized by PT 12/23, pain appears managed with PO medications COPD- appears to be at baseline, on 2lpm [place]  D/C planning-  Ongoing.  Family has requested placement at facility while pt and caregiver (dtr) get their strength back up.  Family requests to revoke hospice to pursue this as they cannot afford to pay out of pocket for either. GOC-  Clear.  Pt remains a DNR.  Family aware that purpose of rehab is to help her get back whatever mobility is possible to allow a safer return to home.  Restart hospice services after rehab is complete.  Will follow from a palliative perspective at facility, family aware. IDT-  Updated Family-  Several discussions  Venia Carbon RN, BSN, CCRN [facility] (in [place]) [phone]

## 2019-06-01 MED ORDER — SENNOSIDES-DOCUSATE SODIUM 8.6-50 MG PO TABS
1.0000 | ORAL_TABLET | Freq: Two times a day (BID) | ORAL | Status: AC
  Administered 2019-06-01 – 2019-06-02 (×4): 1 via ORAL
  Filled 2019-06-01 (×4): qty 1

## 2019-06-01 NOTE — Plan of Care (Signed)
  Problem: Pain Managment: Goal: General experience of comfort will improve Outcome: Progressing   Problem: Safety: Goal: Ability to remain free from injury will improve Outcome: Progressing   Problem: Skin Integrity: Goal: Risk for impaired skin integrity will decrease Outcome: Progressing   Problem: Activity: Goal: Ability to ambulate and perform ADLs will improve Outcome: Progressing   Problem: Pain Management: Goal: Pain level will decrease Outcome: Progressing

## 2019-06-01 NOTE — Progress Notes (Signed)
    Subjective: Patient is sitting up in her hospital bed eating breakfast. She reports her pain has improved. She has been able to transfer out of her bed into her recliner. She has no complaints.  Objective:   VITALS:   Vitals:   05/31/19 1941 05/31/19 1957 06/01/19 0328 06/01/19 0844  BP: (!) 130/54  (!) 127/50 (!) 141/68  Pulse: (!) 102  77 96  Resp: 16     Temp: 98.4 F (36.9 C)  98 F (36.7 C) 98.8 F (37.1 C)  TempSrc: Oral  Oral Oral  SpO2: 99% 100% 98% 96%  Weight:      Height:       CBC Latest Ref Rng & Units 05/29/2019 05/28/2019 05/27/2019  WBC 4.0 - 10.5 K/uL 16.6(H) 12.6(H) 12.6(H)  Hemoglobin 12.0 - 15.0 g/dL 10.3(L) 11.0(L) 11.1(L)  Hematocrit 36.0 - 46.0 % 32.9(L) 33.5(L) 35.3(L)  Platelets 150 - 400 K/uL 242 204 188   BMP Latest Ref Rng & Units 05/29/2019 05/27/2019 05/26/2019  Glucose 70 - 99 mg/dL - 109(H) 156(H)  BUN 8 - 23 mg/dL - 8 12  Creatinine 0.44 - 1.00 mg/dL 0.53 0.42(L) 0.30(L)  Sodium 135 - 145 mmol/L - 135 138  Potassium 3.5 - 5.1 mmol/L - 4.1 4.0  Chloride 98 - 111 mmol/L - 96(L) 101  CO2 22 - 32 mmol/L - 31 31  Calcium 8.9 - 10.3 mg/dL - 9.0 8.4(L)   Intake/Output      12/24 0701 - 12/25 0700 12/25 0701 - 12/26 0700   P.O. 720    I.V. (mL/kg)     Total Intake(mL/kg) 720 (15.2)    Urine (mL/kg/hr) 1900 (1.7)    Total Output 1900    Net -1180             Physical Exam: General: NAD.  Upright in bed.  Calm, conversant.  No increased work of breathing.  O2 in place.   MSK Left lower extremity: incision CDI, tender to palpation about the left hip, reports pain with ROM of hip, intact EHL/TA/GSC, sensation intact distally, leg lengths equal, warm well perfused foot   Assessment: 3 Days Post-Op  S/P Procedure(s) (LRB): INTRAMEDULLARY (IM) NAIL INTERTROCHANTRIC (Left) by Dr. Ernesta Amble. Percell Miller on 05/29/2019  Principal Problem:   Closed left hip fracture ([place]) Active Problems:   COPD (chronic obstructive pulmonary disease)  ([place])   Chronic respiratory failure with hypoxia (HCC)   Coronary artery calcification   Fall   Palliative care by specialist   Closed left intertrochanteric femur fracture, status post IM nail Doing well postop day 3 Pain controlled She worked with PT yesterday.   Plan: Up with therapy Incentive Spirometry Apply ice PRN  Weightbearing: WBAT LLE Insicional and dressing care: Dressings left intact until follow-up Showering: Keep dressing dry VTE prophylaxis: Lovenox, SCDs, ambulation Pain control: Minimize narcotics.  Continue current regimen. Follow - up plan: 2 weeks Contact information:  Edmonia Lynch MD, Roxan Hockey PA-C  Dispo:  Family has decided to pursue a SNF for ST rehab, awaiting bed offers. Case Manager following.   Cleared for discharge from orthopedics standpoint once cleared by medicine team and therapies.   Heather Chick, PA-C 06/01/2019, 10:16 AM

## 2019-06-01 NOTE — Progress Notes (Signed)
PROGRESS NOTE  Heather Barnes DUK:[id number] DOB: June 22, 1933 DOA: 05/26/2019 PCP: Orpah Melter, MD  Brief Narrative:  Heather Barnes 85 y.o.femalewith medical history significant ofPVCs, osteoporosis, lung cancer (completed treatment), breast cancer (completed treatment), CAD, arthritis, COPD on 3L - on home hospice for this diagnosis. She presented after a mechanical fall using her walker. She notes that she has been in bed for the past 3 weeks due to back spasms and not being able to walk. She had been feeling weak and has only recently been back up and moving.  In the ED the patient was found to have a comminuted intertrochanteric fracture of the left proximal femur.    HPI/Recap of past 24 hours:  Post op day 3, no acute overnight event, no new complaints  Assessment/Plan: Principal Problem:   Closed left hip fracture (HCC) Active Problems:   COPD (chronic obstructive pulmonary disease) (HCC)   Chronic respiratory failure with hypoxia (HCC)   Coronary artery calcification   Fall   Palliative care by specialist  Closed left hip fracture INTRAMEDULLARY (IM) NAIL INTERTROCHANTRIC (Left) by Dr. Ernesta Amble. Murphy on 05/29/2019 Weightbearing: WBAT LLE Insicional and dressing care: Dressings left intact until follow-up Showering: Keep dressing dry VTE prophylaxis: Lovenox, SCDs, ambulation Follow-up with Dr. 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 On her 3lpm O2, on chronic prednisone 20 mg daily On home hospice -She is stable, appreciate pulmonology input.  Hypertension Stable on home medication Lopressor 12.5 mg twice a day  DVT Prophylaxis: Lovenox  Code Status: DNR  Family Communication: patient ,   Disposition Plan: family decide on SNF placement, repeat covid testing negative on 12/24   Consultants:  Orthopedics  Palliative  care  Hospice  Pulmonology  Procedures: INTRAMEDULLARY (IM) NAIL INTERTROCHANTRIC (Left) by Dr. Ernesta Amble. Murphy on 05/29/2019  Antibiotics:  Operative Ancef   Objective: BP (!) 141/68   Pulse 96   Temp 98.8 F (37.1 C) (Oral)   Resp 16   Ht 4\' 10"  (1.473 m)   Wt 47.3 kg   SpO2 96%   BMI 21.79 kg/m   Intake/Output Summary (Last 24 hours) at 06/01/2019 3762 Last data filed at 06/01/2019 0330 Gross per 24 hour  Intake 480 ml  Output 1900 ml  Net -1420 ml   Filed Weights   05/26/19 1433 05/26/19 2300  Weight: 49 kg 47.3 kg    Exam: Patient is examined daily including today on 06/01/2019, exams remain the same as of yesterday except that has changed    General: Frail , chronically ill ,NAD, hard of hearing  Cardiovascular: RRR  Respiratory: CTABL  Abdomen: Soft/ND/NT, positive BS  Musculoskeletal: Left hip postop changes, dressing intact, neurovascular intact distally  Neuro: alert, oriented x3  Data Reviewed: Basic Metabolic Panel: Recent Labs  Lab 05/26/19 1520 05/27/19 0734 05/28/19 0522 05/29/19 0405 05/29/19 1828 05/30/19 0428  NA 138 135  --   --   --   --   K 4.0 4.1  --   --   --   --   CL 101 96*  --   --   --   --   CO2 31 31  --   --   --   --   GLUCOSE 156* 109*  --   --   --   --   BUN 12 8  --   --   --   --  CREATININE 0.30* 0.42*  --   --  0.53  --   CALCIUM 8.4* 9.0  --   --   --   --   MG  --   --  2.0 1.8  --  1.8  PHOS  --   --  2.9 2.8  --  3.3   Liver Function Tests: Recent Labs  Lab 05/26/19 1520  AST 12*  ALT 17  ALKPHOS 99  BILITOT 0.5  PROT 5.2*  ALBUMIN 3.2*   No results for input(s): LIPASE, AMYLASE in the last 168 hours. No results for input(s): AMMONIA in the last 168 hours. CBC: Recent Labs  Lab 05/26/19 1520 05/27/19 0734 05/28/19 0522 05/29/19 1828  WBC 11.0* 12.6* 12.6* 16.6*  NEUTROABS 10.0*  --   --   --   HGB 12.0 11.1* 11.0* 10.3*  HCT 39.3 35.3* 33.5* 32.9*  MCV 89.7 86.3 85.5 87.0   PLT 181 188 204 242   Cardiac Enzymes:   No results for input(s): CKTOTAL, CKMB, CKMBINDEX, TROPONINI in the last 168 hours. BNP (last 3 results) Recent Labs    11/24/18 0452  BNP 119.0*    ProBNP (last 3 results) No results for input(s): PROBNP in the last 8760 hours.  CBG: No results for input(s): GLUCAP in the last 168 hours.  Recent Results (from the past 240 hour(s))  SARS CORONAVIRUS 2 (TAT 6-24 HRS) Nasopharyngeal Nasopharyngeal Swab     Status: None   Collection Time: 05/26/19  6:09 PM   Specimen: Nasopharyngeal Swab  Result Value Ref Range Status   SARS Coronavirus 2 NEGATIVE NEGATIVE Final    Comment: (NOTE) SARS-CoV-2 target nucleic acids are NOT DETECTED. The SARS-CoV-2 RNA is generally detectable in upper and lower respiratory specimens during the acute phase of infection. Negative results do not preclude SARS-CoV-2 infection, do not rule out co-infections with other pathogens, and should not be used as the sole basis for treatment or other patient management decisions. Negative results must be combined with clinical observations, patient history, and epidemiological information. The expected result is Negative. Fact Sheet for Patients: SugarRoll.be Fact Sheet for Healthcare Providers: https://www.woods-mathews.com/ This test is not yet approved or cleared by the [place] FDA and  has been authorized for detection and/or diagnosis of SARS-CoV-2 by FDA under an Emergency Use Authorization (EUA). This EUA will remain  in effect (meaning this test can be used) for the duration of the COVID-19 declaration under Section 56 4(b)(1) of the Act, 21 U.S.C. section 360bbb-3(b)(1), unless the authorization is terminated or revoked sooner. Performed at [facility], [place] [address], [place], [place] [address]   MRSA PCR Screening     Status: None   Collection Time: 05/26/19 10:57 PM   Specimen: Nasal Mucosa;  Nasopharyngeal  Result Value Ref Range Status   MRSA by PCR NEGATIVE NEGATIVE Final    Comment:        The GeneXpert MRSA Assay (FDA approved for NASAL specimens only), is one component of a comprehensive MRSA colonization surveillance program. It is not intended to diagnose MRSA infection nor to guide or monitor treatment for MRSA infections. Performed at [facility], [place] [address], [place], [place] [address]   SARS CORONAVIRUS 2 (TAT 6-24 HRS) Nasopharyngeal Nasopharyngeal Swab     Status: None   Collection Time: 05/31/19 11:11 AM   Specimen: Nasopharyngeal Swab  Result Value Ref Range Status   SARS Coronavirus 2 NEGATIVE NEGATIVE Final    Comment: (NOTE)  SARS-CoV-2 target nucleic acids are NOT DETECTED. The SARS-CoV-2 RNA is generally detectable in upper and lower respiratory specimens during the acute phase of infection. Negative results do not preclude SARS-CoV-2 infection, do not rule out co-infections with other pathogens, and should not be used as the sole basis for treatment or other patient management decisions. Negative results must be combined with clinical observations, patient history, and epidemiological information. The expected result is Negative. Fact Sheet for Patients: SugarRoll.be Fact Sheet for Healthcare Providers: https://www.woods-mathews.com/ This test is not yet approved or cleared by the [place] FDA and  has been authorized for detection and/or diagnosis of SARS-CoV-2 by FDA under an Emergency Use Authorization (EUA). This EUA will remain  in effect (meaning this test can be used) for the duration of the COVID-19 declaration under Section 56 4(b)(1) of the Act, 21 U.S.C. section 360bbb-3(b)(1), unless the authorization is terminated or revoked sooner. Performed at [facility], [place] [address] [place], [place] [address]      Studies: No results found.  Scheduled Meds: .  calcium-vitamin D  1 tablet Oral Daily  . Chlorhexidine Gluconate Cloth  6 each Topical Daily  . cholecalciferol  2,000 Units Oral Daily  . enoxaparin (LOVENOX) injection  40 mg Subcutaneous Q24H  . feeding supplement (ENSURE ENLIVE)  237 mL Oral BID BM  . ipratropium-albuterol  3 mL Nebulization BID  . metoprolol tartrate  12.5 mg Oral BID  . predniSONE  15 mg Oral Q breakfast    Continuous Infusions: . lactated ringers Stopped (05/30/19 2059)     Time spent: 51mins I have personally reviewed and interpreted on  06/01/2019 daily labs,  imagings as discussed above under date review session and assessment and plans.  I reviewed all nursing notes, pharmacy notes, consultant notes,  vitals, pertinent old records  I have discussed plan of care as described above with RN , patient  on 06/01/2019   Florencia Reasons MD, PhD, FACP  [facility] [email or web address], password [email or web address] 06/01/2019, 9:27 AM  LOS: 6 days

## 2019-06-01 NOTE — Plan of Care (Signed)
  Problem: Pain Management: Goal: Pain level will decrease Outcome: Progressing   Problem: Activity: Goal: Ability to ambulate and perform ADLs will improve Outcome: Progressing   Problem: Pain Managment: Goal: General experience of comfort will improve Outcome: Progressing   Problem: Safety: Goal: Ability to remain free from injury will improve Outcome: Progressing   Problem: Skin Integrity: Goal: Risk for impaired skin integrity will decrease Outcome: Progressing

## 2019-06-02 NOTE — Care Management (Signed)
Spoke with son.  He requested for [facility] to be contacted regarding SNF placement for patient.  Referral sent as requested. All SNF requests pending.

## 2019-06-02 NOTE — Progress Notes (Signed)
PROGRESS NOTE  Heather Barnes [id number]:[id number] DOB: 10/14/1933 DOA: 05/26/2019 PCP: Orpah Melter, MD  Brief Narrative:  Heather Barnes 85 y.o.femalewith medical history significant ofPVCs, osteoporosis, lung cancer (completed treatment), breast cancer (completed treatment), CAD, arthritis, COPD on 3L - on home hospice for this diagnosis. She presented after a mechanical fall using her walker. She notes that she has been in bed for the past 3 weeks due to back spasms and not being able to walk. She had been feeling weak and has only recently been back up and moving.  In the ED the patient was found to have a comminuted intertrochanteric fracture of the left proximal femur.    HPI/Recap of past 24 hours:  Post op day 4,  no acute overnight event, no new complaints Awaiting for skilled nursing facility placement  Assessment/Plan: Principal Problem:   Closed left hip fracture ([place]) Active Problems:   COPD (chronic obstructive pulmonary disease) (HCC)   Chronic respiratory failure with hypoxia (HCC)   Coronary artery calcification   Fall   Palliative care by specialist  Closed left hip fracture INTRAMEDULLARY (IM) NAIL INTERTROCHANTRIC (Left) by Dr. Ernesta Amble. Murphy on 05/29/2019 Weightbearing: WBAT LLE Insicional and dressing care: Dressings left intact until follow-up Showering: Keep dressing dry VTE prophylaxis: Lovenox, SCDs, ambulation Follow-up with Dr. 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 On her 3lpm O2, on chronic prednisone 20 mg daily On home hospice -She is stable, appreciate pulmonology input.  Hypertension Stable on home medication Lopressor 12.5 mg twice a day  DVT Prophylaxis: Lovenox  Code Status: DNR  Family Communication: patient ,   Disposition Plan: family decide on SNF placement, repeat covid test on 12/26 in preparation for  Monday discharge   Consultants:  Orthopedics  Palliative care  Hospice  Pulmonology  Procedures: INTRAMEDULLARY (IM) NAIL INTERTROCHANTRIC (Left) by Dr. Ernesta Amble. Murphy on 05/29/2019  Antibiotics:  Operative Ancef   Objective: BP (!) 118/54 (BP Location: Left Arm)   Pulse 97   Temp 97.6 F (36.4 C) (Oral)   Resp 16   Ht 4\' 10"  (1.473 m)   Wt 47.3 kg   SpO2 100%   BMI 21.79 kg/m   Intake/Output Summary (Last 24 hours) at 06/02/2019 1310 Last data filed at 06/02/2019 0900 Gross per 24 hour  Intake 480 ml  Output 700 ml  Net -220 ml   Filed Weights   05/26/19 1433 05/26/19 2300  Weight: 49 kg 47.3 kg    Exam: Patient is examined daily including today on 06/02/2019, exams remain the same as of yesterday except that has changed    General: Frail , chronically ill ,NAD, hard of hearing  Cardiovascular: RRR  Respiratory: CTABL  Abdomen: Soft/ND/NT, positive BS  Musculoskeletal: Left hip postop changes, dressing intact, neurovascular intact distally  Neuro: alert, oriented x3  Data Reviewed: Basic Metabolic Panel: Recent Labs  Lab 05/26/19 1520 05/27/19 0734 05/28/19 0522 05/29/19 0405 05/29/19 1828 05/30/19 0428  NA 138 135  --   --   --   --   K 4.0 4.1  --   --   --   --   CL 101 96*  --   --   --   --   CO2 31 31  --   --   --   --   GLUCOSE 156* 109*  --   --   --   --  BUN 12 8  --   --   --   --   CREATININE 0.30* 0.42*  --   --  0.53  --   CALCIUM 8.4* 9.0  --   --   --   --   MG  --   --  2.0 1.8  --  1.8  PHOS  --   --  2.9 2.8  --  3.3   Liver Function Tests: Recent Labs  Lab 05/26/19 1520  AST 12*  ALT 17  ALKPHOS 99  BILITOT 0.5  PROT 5.2*  ALBUMIN 3.2*   No results for input(s): LIPASE, AMYLASE in the last 168 hours. No results for input(s): AMMONIA in the last 168 hours. CBC: Recent Labs  Lab 05/26/19 1520 05/27/19 0734 05/28/19 0522 05/29/19 1828  WBC 11.0* 12.6* 12.6* 16.6*  NEUTROABS 10.0*  --   --    --   HGB 12.0 11.1* 11.0* 10.3*  HCT 39.3 35.3* 33.5* 32.9*  MCV 89.7 86.3 85.5 87.0  PLT 181 188 204 242   Cardiac Enzymes:   No results for input(s): CKTOTAL, CKMB, CKMBINDEX, TROPONINI in the last 168 hours. BNP (last 3 results) Recent Labs    11/24/18 0452  BNP 119.0*    ProBNP (last 3 results) No results for input(s): PROBNP in the last 8760 hours.  CBG: No results for input(s): GLUCAP in the last 168 hours.  Recent Results (from the past 240 hour(s))  SARS CORONAVIRUS 2 (TAT 6-24 HRS) Nasopharyngeal Nasopharyngeal Swab     Status: None   Collection Time: 05/26/19  6:09 PM   Specimen: Nasopharyngeal Swab  Result Value Ref Range Status   SARS Coronavirus 2 NEGATIVE NEGATIVE Final    Comment: (NOTE) SARS-CoV-2 target nucleic acids are NOT DETECTED. The SARS-CoV-2 RNA is generally detectable in upper and lower respiratory specimens during the acute phase of infection. Negative results do not preclude SARS-CoV-2 infection, do not rule out co-infections with other pathogens, and should not be used as the sole basis for treatment or other patient management decisions. Negative results must be combined with clinical observations, patient history, and epidemiological information. The expected result is Negative. Fact Sheet for Patients: SugarRoll.be Fact Sheet for Healthcare Providers: https://www.woods-mathews.com/ This test is not yet approved or cleared by the [place] FDA and  has been authorized for detection and/or diagnosis of SARS-CoV-2 by FDA under an Emergency Use Authorization (EUA). This EUA will remain  in effect (meaning this test can be used) for the duration of the COVID-19 declaration under Section 56 4(b)(1) of the Act, 21 U.S.C. section 360bbb-3(b)(1), unless the authorization is terminated or revoked sooner. Performed at [facility], [place] [address] [place], [place] [address]   MRSA PCR Screening      Status: None   Collection Time: 05/26/19 10:57 PM   Specimen: Nasal Mucosa; Nasopharyngeal  Result Value Ref Range Status   MRSA by PCR NEGATIVE NEGATIVE Final    Comment:        The GeneXpert MRSA Assay (FDA approved for NASAL specimens only), is one component of a comprehensive MRSA colonization surveillance program. It is not intended to diagnose MRSA infection nor to guide or monitor treatment for MRSA infections. Performed at [facility], [place] [address], [place], [place] [address]   SARS CORONAVIRUS 2 (TAT 6-24 HRS) Nasopharyngeal Nasopharyngeal Swab     Status: None   Collection Time: 05/31/19 11:11 AM   Specimen: Nasopharyngeal Swab  Result Value  Ref Range Status   SARS Coronavirus 2 NEGATIVE NEGATIVE Final    Comment: (NOTE) SARS-CoV-2 target nucleic acids are NOT DETECTED. The SARS-CoV-2 RNA is generally detectable in upper and lower respiratory specimens during the acute phase of infection. Negative results do not preclude SARS-CoV-2 infection, do not rule out co-infections with other pathogens, and should not be used as the sole basis for treatment or other patient management decisions. Negative results must be combined with clinical observations, patient history, and epidemiological information. The expected result is Negative. Fact Sheet for Patients: SugarRoll.be Fact Sheet for Healthcare Providers: https://www.woods-mathews.com/ This test is not yet approved or cleared by the [place] FDA and  has been authorized for detection and/or diagnosis of SARS-CoV-2 by FDA under an Emergency Use Authorization (EUA). This EUA will remain  in effect (meaning this test can be used) for the duration of the COVID-19 declaration under Section 56 4(b)(1) of the Act, 21 U.S.C. section 360bbb-3(b)(1), unless the authorization is terminated or revoked sooner. Performed at [facility], [place] [address], [place],  [place] [address]      Studies: No results found.  Scheduled Meds: . calcium-vitamin D  1 tablet Oral Daily  . Chlorhexidine Gluconate Cloth  6 each Topical Daily  . cholecalciferol  2,000 Units Oral Daily  . enoxaparin (LOVENOX) injection  40 mg Subcutaneous Q24H  . feeding supplement (ENSURE ENLIVE)  237 mL Oral BID BM  . ipratropium-albuterol  3 mL Nebulization BID  . metoprolol tartrate  12.5 mg Oral BID  . predniSONE  15 mg Oral Q breakfast  . senna-docusate  1 tablet Oral BID    Continuous Infusions: . lactated ringers Stopped (05/30/19 2059)     Time spent: 70mins I have personally reviewed and interpreted on  06/02/2019 daily labs,  imagings as discussed above under date review session and assessment and plans.  I reviewed all nursing notes, pharmacy notes, consultant notes,  vitals, pertinent old records  I have discussed plan of care as described above with RN , patient  on 06/02/2019   Florencia Reasons MD, PhD, FACP  [facility] [email or web address], password [facility] 06/02/2019, 1:10 PM  LOS: 7 days

## 2019-06-02 NOTE — Progress Notes (Signed)
[facility] 5N07 -  [facility] ([facility]) - GIP RN Note @ 1800    This a related and covered GIP admission on 05/26/2019 for hip fracture. Daughter contacted on call triage 05/26/2019 indicating patient had fallen and injured her left hip. Called EMS and pt transported ED. Pt is under hospice services with a terminal diagnosis of COPD per Dr. Tomasa Hosteller.  Patient has an out of facility DNR.   Family has decided that pt will need inpatient rehab.  Initial plan was to return home with [facility] services, but children feel that due to her current state and the state of her daughter (primary caregiver who is disabled) facility placement would be best to give the patient and her daughter a break.  Family has chosen [facility] and bed was offered on 12/26 with hope to discharge on 12/28. Pt continues to participate in therapy in hospital.   V/S:  BP (!) 118/54 (BP Location: Left Arm)    Pulse 97    Temp 97.6 F (36.4 C) (Oral)    Resp 16    Ht 4\' 10"  (1.473 m)    Wt 47.3 kg    SpO2 100%    BMI 21.79 kg/m Lab work:  no new labs noted Diagnostics:  No new IVs/PRNs:  none noted   Problem List Advanced COPD/emphysema with chronic hypoxic/hypercapnic respiratory failure on chronic prednisone as outpt. On her 3lpm O2, on chronic prednisone 20 mg daily On home hospice -She is stable, appreciate pulmonology input.   Hypertension Stable on home medication Lopressor 12.5 mg twice a day   D/C planning-  Ongoing.  Family has requested placement at facility while pt and caregiver (dtr) get their strength back up.  Family requests to revoke hospice to pursue this as they cannot afford to pay out of pocket for either. Revocation to begin day of discharge from hospital, per [facility].  GOC-  Clear.  Pt remains a DNR.  Family aware that purpose of rehab is to help her get back whatever mobility is possible to allow a safer return to home.  Restart hospice services after rehab is complete.  Will follow from a palliative perspective  at facility, family aware. IDT-  Updated Writer spoke with pt in hospital room today who is in high spirits and stated that she cannot wait to go get some rehab so that she can go back home to see her family and her hospice team. Pt very optimistic about discharge plan.    Freddie Breech, RN [facility] Liaison [phone]

## 2019-06-02 NOTE — Progress Notes (Signed)
Physical Therapy Treatment Patient Details Name: Heather Barnes MRN: [id number] DOB: 1934/03/03 Today's Date: 06/02/2019    History of Present Illness Pt is 85 y.o. female with medical history significant of PVCs, osteoporosis, lung cancer (completed treatment), breast cancer (completed treatment), CAD, arthritis, COPD on 3L - on home hospice at home for SOPD.  She presented after a mechanical fall using her walker with L intertrochanteric fracture of L proximal femur with extension into subtrochanteric femur.  She is s/p IM nail on 05/29/19 with WBAT status.    PT Comments    Patient seen for mobility progression. Pt in bed upon arrival and agreeable to participate in therapy. Pt requires mod A +2 for safety with functional transfer training.  At time of session TDWB maintained however noted that pt is WBAT. Continue to progress as tolerated.   Follow Up Recommendations  SNF     Equipment Recommendations  (TBD next venue)    Recommendations for Other Services       Precautions / Restrictions Precautions Precautions: Fall Restrictions Weight Bearing Restrictions: Yes LLE Weight Bearing: WBAT    Mobility  Bed Mobility Overal bed mobility: Needs Assistance Bed Mobility: Supine to Sit     Supine to sit: Mod assist     General bed mobility comments: cues for sequencing and use of rail; assist to bring L LE and hips to EOB   Transfers Overall transfer level: Needs assistance Equipment used: Rolling walker (2 wheeled) Transfers: Sit to/from [facility] Sit to Stand: Mod assist;From elevated surface;+2 physical assistance;+2 safety/equipment Stand pivot transfers: Mod assist;+2 physical assistance;+2 safety/equipment       General transfer comment: cues for positioning, hand placement, and maintaining weight bearing status prior to stand from EOB; pt assisted to maintain TDWB, balance, and manage RW when pivoting; cues for upright posture and sequencing    Ambulation/Gait             General Gait Details: unable to maintain TDWB for gait progression   Stairs             Wheelchair Mobility    Modified Rankin (Stroke Patients Only)       Balance Overall balance assessment: History of Falls;Needs assistance   Sitting balance-Leahy Scale: Fair     Standing balance support: Bilateral upper extremity supported;During functional activity Standing balance-Leahy Scale: Poor                              Cognition Arousal/Alertness: Awake/alert Behavior During Therapy: WFL for tasks assessed/performed Overall Cognitive Status: Within Functional Limits for tasks assessed                                 General Comments: easily distracted and tangential at times but follows commands consistently       Exercises      General Comments        Pertinent Vitals/Pain Pain Assessment: Faces Faces Pain Scale: Hurts a little bit Pain Location: L hip with mobility Pain Descriptors / Indicators: Guarding;Grimacing Pain Intervention(s): Limited activity within patient's tolerance;Monitored during session;Repositioned    Home Living                      Prior Function            PT Goals (current goals can now be found in the care  plan section) Progress towards PT goals: Progressing toward goals    Frequency    Min 3X/week      PT Plan Current plan remains appropriate    Co-evaluation              AM-PAC PT "6 Clicks" Mobility   Outcome Measure  Help needed turning from your back to your side while in a flat bed without using bedrails?: A Lot Help needed moving from lying on your back to sitting on the side of a flat bed without using bedrails?: A Lot Help needed moving to and from a bed to a chair (including a wheelchair)?: A Lot Help needed standing up from a chair using your arms (e.g., wheelchair or bedside chair)?: A Lot Help needed to walk in hospital room?:  Total Help needed climbing 3-5 steps with a railing? : Total 6 Click Score: 10    End of Session Equipment Utilized During Treatment: Gait belt;Oxygen Activity Tolerance: Patient tolerated treatment well Patient left: with chair alarm set;in chair;with call bell/phone within reach Nurse Communication: Mobility status PT Visit Diagnosis: Unsteadiness on feet (R26.81);Other abnormalities of gait and mobility (R26.89);Muscle weakness (generalized) (M62.81);History of falling (Z91.81)     Time: 1454-1510 PT Time Calculation (min) (ACUTE ONLY): 16 min  Charges:  $Gait Training: 8-22 mins                     Earney Navy, PTA Acute Rehabilitation Services Pager: [phone] Office: [phone]     Darliss Cheney 06/02/2019, 3:39 PM

## 2019-06-02 NOTE — Care Management (Signed)
CSW spoke with [facility] [phone] admissions coordinator for [facility]. Bed has been offered. Plan is to discharge on Monday 12/28 per facility request. COVID tested on 12/24.   Facility requested that patient be retested for COVID on 12/26 for Monday admission. CSW will leave handoff for weekday [facility] staff to follow up for Monday discharge.

## 2019-06-03 LAB — SARS CORONAVIRUS 2 (TAT 6-24 HRS): SARS Coronavirus 2: NEGATIVE

## 2019-06-03 MED ORDER — FLUTICASONE PROPIONATE 50 MCG/ACT NA SUSP
2.0000 | Freq: Every day | NASAL | Status: DC
  Administered 2019-06-03 – 2019-06-04 (×2): 2 via NASAL
  Filled 2019-06-03: qty 16

## 2019-06-03 NOTE — Progress Notes (Signed)
[facility] 5N07 -  [facility] ([facility]) - GIP RN Note @ [phone]    This a related and covered GIP admission on 05/26/2019 for hip fracture. Daughter contacted on call triage 05/26/2019 indicating patient had fallen and injured her left hip. Called EMS and pt transported ED. Pt is under hospice services with a terminal diagnosis of COPD per Dr. Tomasa Hosteller.  Patient has an out of facility DNR.   Family has decided that pt will need inpatient rehab.  Initial plan was to return home with [facility] services, but children feel that due to her current state and the state of her daughter (primary caregiver who is disabled) facility placement would be best to give the patient and her daughter a break.  Family has chosen [facility] and bed was offered on 12/26 with hope to discharge on 12/28. Pt continues to participate in therapy in hospital.   V/S:   BP: 112/46 P: 79 RR: 16 Temp: 97.8 O2 Sat: 100  Lab work:  Negative for coronavirus on 12/26 Diagnostics:  No new IVs/PRNs:  none noted   Problem List Advanced COPD/emphysema with chronic hypoxic/hypercapnic respiratory failure on chronic prednisone as outpt. On her 3lpm O2, on chronic prednisone 20 mg daily On home hospice -She is stable, appreciate pulmonology input.   Hypertension Stable on home medication Lopressor 12.5 mg twice a day    D/C planning-  Ongoing.  Family has requested placement at facility while pt and caregiver (dtr) get their strength back up.  Family requests to revoke hospice to pursue this as they cannot afford to pay out of pocket for either. Revocation to begin day of discharge from hospital, per [facility] SW.  GOC-  Clear.  Pt remains a DNR.  Family aware that purpose of rehab is to help her get back whatever mobility is possible to allow a safer return to home.  Restart hospice services after rehab is complete.  Will follow from a palliative perspective at facility, family aware. IDT-  Updated Writer spoke with pt in hospital room today who  remains in high spirits and quoted numerous Bible verses. Spoke a lot about her life and her family. Pt is excited about her "new hip". Pt very optimistic about discharge plan.  Freddie Breech, RN [facility] Liaison [phone]

## 2019-06-03 NOTE — Plan of Care (Signed)
  Problem: Activity: Goal: Ability to ambulate and perform ADLs will improve Outcome: Progressing   Problem: Safety: Goal: Ability to remain free from injury will improve Outcome: Progressing   Problem: Skin Integrity: Goal: Risk for impaired skin integrity will decrease Outcome: Progressing   Problem: Elimination: Goal: Will not experience complications related to bowel motility Outcome: Progressing

## 2019-06-03 NOTE — Progress Notes (Signed)
PROGRESS NOTE  Heather Barnes DXI:[id number] DOB: 1933-09-11 DOA: 05/26/2019 PCP: Orpah Melter, MD  Brief Narrative:  Heather Barnes 85 y.o.femalewith medical history significant ofPVCs, osteoporosis, lung cancer (completed treatment), breast cancer (completed treatment), CAD, arthritis, COPD on 3L - on home hospice for this diagnosis. She presented after a mechanical fall using her walker. She notes that she has been in bed for the past 3 weeks due to back spasms and not being able to walk. She had been feeling weak and has only recently been back up and moving.  In the ED the patient was found to have a comminuted intertrochanteric fracture of the left proximal femur.    HPI/Recap of past 24 hours:  Post op day 5,  no acute overnight event, no new complaints Awaiting for skilled nursing facility placement, hopefully can go to snf on monday  Assessment/Plan: Principal Problem:   Closed left hip fracture ([place]) Active Problems:   COPD (chronic obstructive pulmonary disease) (HCC)   Chronic respiratory failure with hypoxia (HCC)   Coronary artery calcification   Fall   Palliative care by specialist  Closed left hip fracture INTRAMEDULLARY (IM) NAIL INTERTROCHANTRIC (Left) by Dr. Ernesta Amble. Murphy on 05/29/2019 Weightbearing: WBAT LLE Insicional and dressing care: Dressings left intact until follow-up Showering: Keep dressing dry VTE prophylaxis: Lovenox, SCDs, ambulation Follow-up with Dr. 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 On her 3lpm O2, on chronic prednisone 20 mg daily On home hospice -She is stable, appreciate pulmonology input.  Hypertension Stable on home medication Lopressor 12.5 mg twice a day  DVT Prophylaxis: Lovenox  Code Status: DNR  Family Communication: patient ,   Disposition Plan: family decide on SNF placement, repeat covid  test ordered on 12/26 in preparation for Monday discharge   Consultants:  Orthopedics  Palliative care  Hospice  Pulmonology  Procedures: INTRAMEDULLARY (IM) NAIL INTERTROCHANTRIC (Left) by Dr. Ernesta Amble. Murphy on 05/29/2019  Antibiotics:  Operative Ancef   Objective: BP (!) 112/46 (BP Location: Left Arm)   Pulse 79   Temp 97.8 F (36.6 C) (Oral)   Resp 16   Ht 4\' 10"  (1.473 m)   Wt 47.3 kg   SpO2 100%   BMI 21.79 kg/m   Intake/Output Summary (Last 24 hours) at 06/03/2019 1220 Last data filed at 06/03/2019 1100 Gross per 24 hour  Intake 720 ml  Output 600 ml  Net 120 ml   Filed Weights   05/26/19 1433 05/26/19 2300  Weight: 49 kg 47.3 kg    Exam: Patient is examined daily including today on 06/03/2019, exams remain the same as of yesterday except that has changed    General: Frail , chronically ill ,NAD, hard of hearing  Cardiovascular: RRR  Respiratory: CTABL  Abdomen: Soft/ND/NT, positive BS  Musculoskeletal: Left hip postop changes, dressing intact, neurovascular intact distally  Neuro: alert, oriented x3  Data Reviewed: Basic Metabolic Panel: Recent Labs  Lab 05/28/19 0522 05/29/19 0405 05/29/19 1828 05/30/19 0428  CREATININE  --   --  0.53  --   MG 2.0 1.8  --  1.8  PHOS 2.9 2.8  --  3.3   Liver Function Tests: No results for input(s): AST, ALT, ALKPHOS, BILITOT, PROT, ALBUMIN in the last 168 hours. No results for input(s): LIPASE, AMYLASE in the last 168 hours. No results for input(s): AMMONIA in the last 168 hours. CBC: Recent Labs  Lab 05/28/19 0522  05/29/19 1828  WBC 12.6* 16.6*  HGB 11.0* 10.3*  HCT 33.5* 32.9*  MCV 85.5 87.0  PLT 204 242   Cardiac Enzymes:   No results for input(s): CKTOTAL, CKMB, CKMBINDEX, TROPONINI in the last 168 hours. BNP (last 3 results) Recent Labs    11/24/18 0452  BNP 119.0*    ProBNP (last 3 results) No results for input(s): PROBNP in the last 8760 hours.  CBG: No results for  input(s): GLUCAP in the last 168 hours.  Recent Results (from the past 240 hour(s))  SARS CORONAVIRUS 2 (TAT 6-24 HRS) Nasopharyngeal Nasopharyngeal Swab     Status: None   Collection Time: 05/26/19  6:09 PM   Specimen: Nasopharyngeal Swab  Result Value Ref Range Status   SARS Coronavirus 2 NEGATIVE NEGATIVE Final    Comment: (NOTE) SARS-CoV-2 target nucleic acids are NOT DETECTED. The SARS-CoV-2 RNA is generally detectable in upper and lower respiratory specimens during the acute phase of infection. Negative results do not preclude SARS-CoV-2 infection, do not rule out co-infections with other pathogens, and should not be used as the sole basis for treatment or other patient management decisions. Negative results must be combined with clinical observations, patient history, and epidemiological information. The expected result is Negative. Fact Sheet for Patients: SugarRoll.be Fact Sheet for Healthcare Providers: https://www.woods-mathews.com/ This test is not yet approved or cleared by the [place] FDA and  has been authorized for detection and/or diagnosis of SARS-CoV-2 by FDA under an Emergency Use Authorization (EUA). This EUA will remain  in effect (meaning this test can be used) for the duration of the COVID-19 declaration under Section 56 4(b)(1) of the Act, 21 U.S.C. section 360bbb-3(b)(1), unless the authorization is terminated or revoked sooner. Performed at [facility], [place] [address], [place], [place] [address]   MRSA PCR Screening     Status: None   Collection Time: 05/26/19 10:57 PM   Specimen: Nasal Mucosa; Nasopharyngeal  Result Value Ref Range Status   MRSA by PCR NEGATIVE NEGATIVE Final    Comment:        The GeneXpert MRSA Assay (FDA approved for NASAL specimens only), is one component of a comprehensive MRSA colonization surveillance program. It is not intended to diagnose MRSA infection nor to guide  or monitor treatment for MRSA infections. Performed at [facility], [place] [address] [place], [place] [address]   SARS CORONAVIRUS 2 (TAT 6-24 HRS) Nasopharyngeal Nasopharyngeal Swab     Status: None   Collection Time: 05/31/19 11:11 AM   Specimen: Nasopharyngeal Swab  Result Value Ref Range Status   SARS Coronavirus 2 NEGATIVE NEGATIVE Final    Comment: (NOTE) SARS-CoV-2 target nucleic acids are NOT DETECTED. The SARS-CoV-2 RNA is generally detectable in upper and lower respiratory specimens during the acute phase of infection. Negative results do not preclude SARS-CoV-2 infection, do not rule out co-infections with other pathogens, and should not be used as the sole basis for treatment or other patient management decisions. Negative results must be combined with clinical observations, patient history, and epidemiological information. The expected result is Negative. Fact Sheet for Patients: SugarRoll.be Fact Sheet for Healthcare Providers: https://www.woods-mathews.com/ This test is not yet approved or cleared by the [place] FDA and  has been authorized for detection and/or diagnosis of SARS-CoV-2 by FDA under an Emergency Use Authorization (EUA). This EUA will remain  in effect (meaning this test can be used) for the duration of the COVID-19 declaration under Section 56 4(b)(1) of  the Act, 21 U.S.C. section 360bbb-3(b)(1), unless the authorization is terminated or revoked sooner. Performed at [facility], [place] [address] [place], [place] [address]      Studies: No results found.  Scheduled Meds: . calcium-vitamin D  1 tablet Oral Daily  . Chlorhexidine Gluconate Cloth  6 each Topical Daily  . cholecalciferol  2,000 Units Oral Daily  . enoxaparin (LOVENOX) injection  40 mg Subcutaneous Q24H  . feeding supplement (ENSURE ENLIVE)  237 mL Oral BID BM  . ipratropium-albuterol  3 mL Nebulization BID  . metoprolol  tartrate  12.5 mg Oral BID  . predniSONE  15 mg Oral Q breakfast    Continuous Infusions: . lactated ringers Stopped (05/30/19 2059)     Time spent: 26mins I have personally reviewed and interpreted on  06/03/2019 daily labs,  imagings as discussed above under date review session and assessment and plans.  I reviewed all nursing notes, pharmacy notes, consultant notes,  vitals, pertinent old records  I have discussed plan of care as described above with RN , patient  on 06/03/2019   Florencia Reasons MD, PhD, FACP  [facility] [email or web address], password [facility] 06/03/2019, 12:20 PM  LOS: 8 days

## 2019-06-03 NOTE — Plan of Care (Signed)

## 2019-06-04 NOTE — Progress Notes (Signed)
Left message with Heather Barnes @ [address] for discharge report.  All paperwork sent with patient by PTAR.

## 2019-06-04 NOTE — TOC Transition Note (Signed)
Transition of Care [facility]) - CM/SW Discharge Note   Patient Details  Name: Heather Barnes MRN: [id number] Date of Birth: [birth year]  Transition of Care [facility]) CM/SW Contact:  Heather Median, LCSW Phone Number: 06/04/2019, 10:18 AM   Clinical Narrative:    Discharged to SNF. Referral coordinated with Heather Barnes [phone]. Patient's son Heather Barnes aware and agreeable to this plan. Number to call report [phone] given to unit RN Doroteo Bradford. No other needs at this time. Case closed to this CSW.    Final next level of care: Skilled Nursing Facility Barriers to Discharge: Barriers Resolved   Patient Goals and CMS Choice   CMS Medicare.gov Compare Post Acute Care list provided to:: Patient Represenative (must comment)(Heather Barnes (son)) Choice offered to / list presented to : Adult Children(Heather Barnes (son))  Discharge Placement PASRR number recieved: 06/04/19              Patient to be transferred to facility by: [facility] Name of family member notified: Heather(son) Patient and family notified of of transfer: 06/04/19  Discharge Plan and Services In-house Referral: NA Discharge Planning Services: CM Consult            DME Arranged: N/A DME Agency: NA       HH Arranged: NA HH Agency: NA        Social Determinants of Health (SDOH) Interventions     Readmission Risk Interventions Readmission Risk Prevention Plan 05/30/2019 12/04/2018 11/27/2018  Transportation Screening Complete Complete Complete  PCP or Specialist Appt within 3-5 Days - - Not Complete  Not Complete comments - - not yet ready for d/c  Home Care Screening - Complete -  [place] or Home Care Consult Complete - Complete  Social Work Consult for [place] Planning/Counseling Complete - Complete  Palliative Care Screening Complete - Not Applicable  Medication Review Press photographer) Complete - Complete  Some recent data might be hidden

## 2019-06-04 NOTE — Discharge Summary (Signed)
Physician Discharge Summary  Arminta Gamm Meeker ZTI:[id number] DOB: [birth year] DOA: 05/26/2019  PCP: Orpah Melter, MD  Admit date: 05/26/2019  Discharge date: 06/04/2019  Admitted From:Home hospice  Disposition:  SNF  Recommendations for Outpatient Follow-up:  1. Follow up with PCP in 1-2 weeks 2. Follow-up with orthopedics in 2 weeks 3. Continue on Lovenox for DVT prophylaxis and Norco for pain management as prescribed 4. Continue other home medications as previously prescribed  Home Health:None  Equipment/Devices: Has 4 L nasal cannula oxygen  Discharge Condition: Stable  CODE STATUS: DNR  Diet recommendation: Heart Healthy  Brief/Interim Summary: Heather Barnes y.o.femalewith medical history significant ofPVCs, osteoporosis, lung cancer (completed treatment), breast cancer (completed treatment), CAD, arthritis, COPD on 3L - on home hospice for this diagnosis. She presented after a mechanical fall using her walker. She notes that she has been in bed for the past 3 weeks due to back spasms and not being able to walk. She had been feeling weak and has only recently been back up and moving.  In the ED the patient was found to have a comminuted intertrochanteric fracture of the left proximal femur.  She is currently stable for discharge on her usual home oxygen requirements.  Covid testing on 12/26 noted to be negative.  She is comfortable and will require rehabilitation prior to discharge back to home hospice.  Closed left hip fracture INTRAMEDULLARY (IM) NAIL INTERTROCHANTRIC (Left) by Dr. Ernesta Amble. Percell Miller on12/22/2020 Weightbearing:WBATLLE Insicional and dressing care: Dressings left intact until follow-up Showering: Keep dressing dry VTE prophylaxis:Lovenox, SCDs, ambulation Follow-up with Dr. Percell Miller in 2 weeks snf placement   Advanced COPD/emphysema with chronic hypoxic/hypercapnic respiratory failure on chronic prednisone as outpt.On her 3lpm O2,  on chronic prednisone 20 mg daily On home hospice -She is stable, appreciate pulmonology input.  Hypertension Stable on home medication Lopressor 12.5 mg twice a day  Discharge Diagnoses:  Principal Problem:   Closed left hip fracture ([place]) Active Problems:   COPD (chronic obstructive pulmonary disease) (HCC)   Chronic respiratory failure with hypoxia (HCC)   Coronary artery calcification   Fall   Palliative care by specialist    Discharge Instructions  Discharge Instructions    Diet - low sodium heart healthy   Complete by: As directed    Increase activity slowly   Complete by: As directed      Allergies as of 06/04/2019      Reactions   Tiotropium Bromide Monohydrate Hives, Shortness Of Breath, Rash   Spiriva    Codeine Other (See Comments)   Hyperactivity,crying   Fosamax [alendronate] Other (See Comments)   Muscle aches   Asa [aspirin] Other (See Comments)   Bleeding -ulcers   Latex Rash      Medication List    STOP taking these medications   acetaminophen 500 MG tablet Commonly known as: TYLENOL   azelastine 0.1 % nasal spray Commonly known as: ASTELIN   bisacodyl 10 MG suppository Commonly known as: DULCOLAX   guaiFENesin 600 MG 12 hr tablet Commonly known as: MUCINEX   haloperidol 5 MG tablet Commonly known as: HALDOL   levofloxacin 250 MG tablet Commonly known as: LEVAQUIN   morphine CONCENTRATE 10 MG/0.5ML Soln concentrated solution   phenol 1.4 % Liqd Commonly known as: CHLORASEPTIC     TAKE these medications   cetirizine 10 MG tablet Commonly known as: ZYRTEC Take 10 mg by mouth daily.   enoxaparin 40 MG/0.4ML injection Commonly known as: LOVENOX Inject 0.4 mLs (40 mg  total) into the skin daily for 30 doses. For 30 days post op for DVT prophylaxis   feeding supplement (ENSURE ENLIVE) Liqd Take 237 mLs by mouth 2 (two) times daily between meals.   HYDROcodone-acetaminophen 5-325 MG tablet Commonly known as: Norco Take 1  tablet by mouth every 6 (six) hours as needed for severe pain.   ipratropium-albuterol 0.5-2.5 (3) MG/3ML Soln Commonly known as: DUONEB Take 3 mLs by nebulization 4 (four) times daily. What changed: when to take this   metoprolol tartrate 25 MG tablet Commonly known as: LOPRESSOR TAKE 1/2 (ONE-HALF) TABLET BY MOUTH TWICE DAILY What changed: See the new instructions.   OXYGEN Inhale into the lungs. 3L at rest and 4pulse with exertion   predniSONE 20 MG tablet Commonly known as: DELTASONE Take 1 tablet (20 mg total) by mouth daily with breakfast.   PreserVision AREDS 2 Caps Take 1 tablet by mouth 2 (two) times a day.   sennosides-docusate sodium 8.6-50 MG tablet Commonly known as: SENOKOT-S Take 1 tablet by mouth 2 (two) times daily.   Systane Complete 0.6 % Soln Generic drug: Propylene Glycol Place 1 drop into both eyes 2 (two) times daily.      Follow-up Information    Renette Butters, MD. Schedule an appointment as soon as possible for a visit in 2 weeks.   Specialty: Orthopedic Surgery Contact information: [address] [place] [address] [phone]        Orpah Melter, MD.   Specialty: Internal Medicine Contact information: [place] [address] [phone]          Allergies  Allergen Reactions  . Tiotropium Bromide Monohydrate Hives, Shortness Of Breath and Rash    Spiriva   . Codeine Other (See Comments)    Hyperactivity,crying  . Fosamax [Alendronate] Other (See Comments)    Muscle aches  . Asa [Aspirin] Other (See Comments)    Bleeding -ulcers  . Latex Rash    Consultations:  Orthopedics  Palliative care  Hospice  Pulmonology   Procedures/Studies: DG C-Arm 1-60 Min  Result Date: 05/29/2019 CLINICAL DATA:  Intraoperative imaging for fixation of a left intertrochanteric fracture which the patient suffered in a fall 05/26/2019. Initial encounter.  EXAM: DG C-ARM 1-60 MIN; LEFT FEMUR 2 VIEWS COMPARISON:  Plain films left hip 05/26/2019. FINDINGS: Six fluoroscopic intraoperative spot views demonstrate placement of a hip screw and long intramedullary nail with a single distal screw for fixation of a comminuted left intertrochanteric fracture. Hardware is intact. Position and alignment are improved. No acute abnormality. IMPRESSION: Intraoperative imaging for fixation of a left intertrochanteric fracture. No acute finding. Electronically Signed   By: Inge Rise M.D.   On: 05/29/2019 15:54   DG Hip Unilat W or Wo Pelvis 2-3 Views Left  Result Date: 05/26/2019 CLINICAL DATA:  Witnessed fall, shortening and lateral rotation of the left leg, history of osteoporosis EXAM: DG HIP (WITH OR WITHOUT PELVIS) 2-3V LEFT COMPARISON:  None FINDINGS: Comminuted intertrochanteric fracture of the left proximal femur with valgus deformity and superior migration of the distal fracture fragment. There is some extension of the fracture into the subtrochanteric femur. No additional fracture is identified. Lateral view is limited due to overlying material but suggest posterior displacement also of the distal fracture component. IMPRESSION: Comminuted intertrochanteric fracture of the left proximal femur with valgus deformity and superior and posterior displacement of distal fracture elements with extension of the fracture into the subtrochanteric  femur. Electronically Signed   By: Zetta Bills M.D.   On: 05/26/2019 15:29   DG FEMUR MIN 2 VIEWS LEFT  Result Date: 05/29/2019 CLINICAL DATA:  Intraoperative imaging for fixation of a left intertrochanteric fracture which the patient suffered in a fall 05/26/2019. Initial encounter. EXAM: DG C-ARM 1-60 MIN; LEFT FEMUR 2 VIEWS COMPARISON:  Plain films left hip 05/26/2019. FINDINGS: Six fluoroscopic intraoperative spot views demonstrate placement of a hip screw and long intramedullary nail with a single distal screw for  fixation of a comminuted left intertrochanteric fracture. Hardware is intact. Position and alignment are improved. No acute abnormality. IMPRESSION: Intraoperative imaging for fixation of a left intertrochanteric fracture. No acute finding. Electronically Signed   By: Inge Rise M.D.   On: 05/29/2019 15:54     Discharge Exam: Vitals:   06/04/19 0313 06/04/19 0741  BP: (!) 121/55 121/61  Pulse: 86 95  Resp: 17 14  Temp: 98.4 F (36.9 C) 98.9 F (37.2 C)  SpO2: 97% 100%   Vitals:   06/03/19 1504 06/03/19 1938 06/04/19 0313 06/04/19 0741  BP: (!) 125/51 (!) 116/55 (!) 121/55 121/61  Pulse: 85 84 86 95  Resp:  16 17 14   Temp: 98.5 F (36.9 C) 97.8 F (36.6 C) 98.4 F (36.9 C) 98.9 F (37.2 C)  TempSrc: Oral Oral Oral Oral  SpO2: 96% 99% 97% 100%  Weight:      Height:        General: Pt is alert, awake, not in acute distress Cardiovascular: RRR, S1/S2 +, no rubs, no gallops Respiratory: CTA bilaterally, no wheezing, no rhonchi, currently on 4L [place] Abdominal: Soft, NT, ND, bowel sounds + Extremities: no edema, no cyanosis    The results of significant diagnostics from this hospitalization (including imaging, microbiology, ancillary and laboratory) are listed below for reference.     Microbiology: Recent Results (from the past 240 hour(s))  SARS CORONAVIRUS 2 (TAT 6-24 HRS) Nasopharyngeal Nasopharyngeal Swab     Status: None   Collection Time: 05/26/19  6:09 PM   Specimen: Nasopharyngeal Swab  Result Value Ref Range Status   SARS Coronavirus 2 NEGATIVE NEGATIVE Final    Comment: (NOTE) SARS-CoV-2 target nucleic acids are NOT DETECTED. The SARS-CoV-2 RNA is generally detectable in upper and lower respiratory specimens during the acute phase of infection. Negative results do not preclude SARS-CoV-2 infection, do not rule out co-infections with other pathogens, and should not be used as the sole basis for treatment or other patient management decisions. Negative  results must be combined with clinical observations, patient history, and epidemiological information. The expected result is Negative. Fact Sheet for Patients: SugarRoll.be Fact Sheet for Healthcare Providers: https://www.woods-mathews.com/ This test is not yet approved or cleared by the [place] FDA and  has been authorized for detection and/or diagnosis of SARS-CoV-2 by FDA under an Emergency Use Authorization (EUA). This EUA will remain  in effect (meaning this test can be used) for the duration of the COVID-19 declaration under Section 56 4(b)(1) of the Act, 21 U.S.C. section 360bbb-3(b)(1), unless the authorization is terminated or revoked sooner. Performed at [facility], [place] [address], [place], [place] [address]   MRSA PCR Screening     Status: None   Collection Time: 05/26/19 10:57 PM   Specimen: Nasal Mucosa; Nasopharyngeal  Result Value Ref Range Status   MRSA by PCR NEGATIVE NEGATIVE Final    Comment:        The GeneXpert MRSA Assay (FDA approved for  NASAL specimens only), is one component of a comprehensive MRSA colonization surveillance program. It is not intended to diagnose MRSA infection nor to guide or monitor treatment for MRSA infections. Performed at [facility], [place] [address] [place], [place] [address]   SARS CORONAVIRUS 2 (TAT 6-24 HRS) Nasopharyngeal Nasopharyngeal Swab     Status: None   Collection Time: 05/31/19 11:11 AM   Specimen: Nasopharyngeal Swab  Result Value Ref Range Status   SARS Coronavirus 2 NEGATIVE NEGATIVE Final    Comment: (NOTE) SARS-CoV-2 target nucleic acids are NOT DETECTED. The SARS-CoV-2 RNA is generally detectable in upper and lower respiratory specimens during the acute phase of infection. Negative results do not preclude SARS-CoV-2 infection, do not rule out co-infections with other pathogens, and should not be used as the sole basis for treatment or other  patient management decisions. Negative results must be combined with clinical observations, patient history, and epidemiological information. The expected result is Negative. Fact Sheet for Patients: SugarRoll.be Fact Sheet for Healthcare Providers: https://www.woods-mathews.com/ This test is not yet approved or cleared by the [place] FDA and  has been authorized for detection and/or diagnosis of SARS-CoV-2 by FDA under an Emergency Use Authorization (EUA). This EUA will remain  in effect (meaning this test can be used) for the duration of the COVID-19 declaration under Section 56 4(b)(1) of the Act, 21 U.S.C. section 360bbb-3(b)(1), unless the authorization is terminated or revoked sooner. Performed at [facility], [place] [address] [place], [place] [address]   SARS CORONAVIRUS 2 (TAT 6-24 HRS) Nasopharyngeal Nasopharyngeal Swab     Status: None   Collection Time: 06/02/19  1:10 PM   Specimen: Nasopharyngeal Swab  Result Value Ref Range Status   SARS Coronavirus 2 NEGATIVE NEGATIVE Final    Comment: (NOTE) SARS-CoV-2 target nucleic acids are NOT DETECTED. The SARS-CoV-2 RNA is generally detectable in upper and lower respiratory specimens during the acute phase of infection. Negative results do not preclude SARS-CoV-2 infection, do not rule out co-infections with other pathogens, and should not be used as the sole basis for treatment or other patient management decisions. Negative results must be combined with clinical observations, patient history, and epidemiological information. The expected result is Negative. Fact Sheet for Patients: SugarRoll.be Fact Sheet for Healthcare Providers: https://www.woods-mathews.com/ This test is not yet approved or cleared by the [place] FDA and  has been authorized for detection and/or diagnosis of SARS-CoV-2 by FDA under an Emergency Use  Authorization (EUA). This EUA will remain  in effect (meaning this test can be used) for the duration of the COVID-19 declaration under Section 56 4(b)(1) of the Act, 21 U.S.C. section 360bbb-3(b)(1), unless the authorization is terminated or revoked sooner. Performed at [facility], [place] [address], [place], [place] [address]      Labs: BNP (last 3 results) Recent Labs    11/24/18 0452  BNP 614.4*   Basic Metabolic Panel: Recent Labs  Lab 05/29/19 0405 05/29/19 1828 05/30/19 0428  CREATININE  --  0.53  --   MG 1.8  --  1.8  PHOS 2.8  --  3.3   Liver Function Tests: No results for input(s): AST, ALT, ALKPHOS, BILITOT, PROT, ALBUMIN in the last 168 hours. No results for input(s): LIPASE, AMYLASE in the last 168 hours. No results for input(s): AMMONIA in the last 168 hours. CBC: Recent Labs  Lab 05/29/19 1828  WBC 16.6*  HGB 10.3*  HCT 32.9*  MCV 87.0  PLT  242   Cardiac Enzymes: No results for input(s): CKTOTAL, CKMB, CKMBINDEX, TROPONINI in the last 168 hours. BNP: Invalid input(s): POCBNP CBG: No results for input(s): GLUCAP in the last 168 hours. D-Dimer No results for input(s): DDIMER in the last 72 hours. Hgb A1c No results for input(s): HGBA1C in the last 72 hours. Lipid Profile No results for input(s): CHOL, HDL, LDLCALC, TRIG, CHOLHDL, LDLDIRECT in the last 72 hours. Thyroid function studies No results for input(s): TSH, T4TOTAL, T3FREE, THYROIDAB in the last 72 hours.  Invalid input(s): FREET3 Anemia work up No results for input(s): VITAMINB12, FOLATE, FERRITIN, TIBC, IRON, RETICCTPCT in the last 72 hours. Urinalysis    Component Value Date/Time   COLORURINE YELLOW 06/11/2015 1308   APPEARANCEUR CLOUDY (A) 06/11/2015 1308   LABSPEC 1.018 06/11/2015 1308   PHURINE 6.0 06/11/2015 1308   GLUCOSEU >1000 (A) 06/11/2015 1308   HGBUR TRACE (A) 06/11/2015 1308   BILIRUBINUR NEGATIVE 06/11/2015 1308   KETONESUR NEGATIVE 06/11/2015 1308    PROTEINUR NEGATIVE 06/11/2015 1308   NITRITE NEGATIVE 06/11/2015 1308   LEUKOCYTESUR SMALL (A) 06/11/2015 1308   Sepsis Labs Invalid input(s): PROCALCITONIN,  WBC,  LACTICIDVEN Microbiology Recent Results (from the past 240 hour(s))  SARS CORONAVIRUS 2 (TAT 6-24 HRS) Nasopharyngeal Nasopharyngeal Swab     Status: None   Collection Time: 05/26/19  6:09 PM   Specimen: Nasopharyngeal Swab  Result Value Ref Range Status   SARS Coronavirus 2 NEGATIVE NEGATIVE Final    Comment: (NOTE) SARS-CoV-2 target nucleic acids are NOT DETECTED. The SARS-CoV-2 RNA is generally detectable in upper and lower respiratory specimens during the acute phase of infection. Negative results do not preclude SARS-CoV-2 infection, do not rule out co-infections with other pathogens, and should not be used as the sole basis for treatment or other patient management decisions. Negative results must be combined with clinical observations, patient history, and epidemiological information. The expected result is Negative. Fact Sheet for Patients: SugarRoll.be Fact Sheet for Healthcare Providers: https://www.woods-mathews.com/ This test is not yet approved or cleared by the [place] FDA and  has been authorized for detection and/or diagnosis of SARS-CoV-2 by FDA under an Emergency Use Authorization (EUA). This EUA will remain  in effect (meaning this test can be used) for the duration of the COVID-19 declaration under Section 56 4(b)(1) of the Act, 21 U.S.C. section 360bbb-3(b)(1), unless the authorization is terminated or revoked sooner. Performed at [facility], [place] [address], [place], [place] [address]   MRSA PCR Screening     Status: None   Collection Time: 05/26/19 10:57 PM   Specimen: Nasal Mucosa; Nasopharyngeal  Result Value Ref Range Status   MRSA by PCR NEGATIVE NEGATIVE Final    Comment:        The GeneXpert MRSA Assay (FDA approved for NASAL  specimens only), is one component of a comprehensive MRSA colonization surveillance program. It is not intended to diagnose MRSA infection nor to guide or monitor treatment for MRSA infections. Performed at [facility], [place] [address] [place], [place] [address]   SARS CORONAVIRUS 2 (TAT 6-24 HRS) Nasopharyngeal Nasopharyngeal Swab     Status: None   Collection Time: 05/31/19 11:11 AM   Specimen: Nasopharyngeal Swab  Result Value Ref Range Status   SARS Coronavirus 2 NEGATIVE NEGATIVE Final    Comment: (NOTE) SARS-CoV-2 target nucleic acids are NOT DETECTED. The SARS-CoV-2 RNA is generally detectable in upper and lower respiratory specimens during the acute phase of infection. Negative results  do not preclude SARS-CoV-2 infection, do not rule out co-infections with other pathogens, and should not be used as the sole basis for treatment or other patient management decisions. Negative results must be combined with clinical observations, patient history, and epidemiological information. The expected result is Negative. Fact Sheet for Patients: SugarRoll.be Fact Sheet for Healthcare Providers: https://www.woods-mathews.com/ This test is not yet approved or cleared by the [place] FDA and  has been authorized for detection and/or diagnosis of SARS-CoV-2 by FDA under an Emergency Use Authorization (EUA). This EUA will remain  in effect (meaning this test can be used) for the duration of the COVID-19 declaration under Section 56 4(b)(1) of the Act, 21 U.S.C. section 360bbb-3(b)(1), unless the authorization is terminated or revoked sooner. Performed at [facility], [place] [address] [place], [place] [address]   SARS CORONAVIRUS 2 (TAT 6-24 HRS) Nasopharyngeal Nasopharyngeal Swab     Status: None   Collection Time: 06/02/19  1:10 PM   Specimen: Nasopharyngeal Swab  Result Value Ref Range Status   SARS Coronavirus 2 NEGATIVE  NEGATIVE Final    Comment: (NOTE) SARS-CoV-2 target nucleic acids are NOT DETECTED. The SARS-CoV-2 RNA is generally detectable in upper and lower respiratory specimens during the acute phase of infection. Negative results do not preclude SARS-CoV-2 infection, do not rule out co-infections with other pathogens, and should not be used as the sole basis for treatment or other patient management decisions. Negative results must be combined with clinical observations, patient history, and epidemiological information. The expected result is Negative. Fact Sheet for Patients: SugarRoll.be Fact Sheet for Healthcare Providers: https://www.woods-mathews.com/ This test is not yet approved or cleared by the [place] FDA and  has been authorized for detection and/or diagnosis of SARS-CoV-2 by FDA under an Emergency Use Authorization (EUA). This EUA will remain  in effect (meaning this test can be used) for the duration of the COVID-19 declaration under Section 56 4(b)(1) of the Act, 21 U.S.C. section 360bbb-3(b)(1), unless the authorization is terminated or revoked sooner. Performed at [facility], [place] [address], [place], [place] [address]      Time coordinating discharge: 35 minutes  SIGNED:   Rodena Goldmann, DO [facility] 06/04/2019, 8:06 AM  If 7PM-7AM, please contact night-coverage [email or web address]

## 2019-06-04 NOTE — Consult Note (Signed)
   [facility] CM Inpatient Consult   06/04/2019  [place] 1933/07/23 [id number]   Patient screened for high risk score for unplanned readmission score in the [employer] [place] [THN] and assess for [facility] services. Review of patient's medical record reveals patient is from Home with hospice care with [facility] and currently for a skilled nursing facility [SNF] stay due to a mechanical fall with a closed intertrochanteric fracture of the left femur.   Plan:  Patient is for transition to [facility] as they have revoked the Hospice benefits per Hospice consult note.  Will alert [facility] PAC RN of disposition and potential transition of care follow up needs.  For questions contact:   Natividad Brood, RN BSN [facility] Liaison  [phone] business mobile phone Toll free office [phone]  Fax number: [phone] [email or web address]

## 2019-06-04 NOTE — Plan of Care (Signed)

## 2019-06-04 NOTE — Plan of Care (Signed)
Problem: Education: Goal: Knowledge of General Education information will improve Description: Including pain rating scale, medication(s)/side effects and non-pharmacologic comfort measures 06/04/2019 1105 by Baldomero Lamy, RN Outcome: Adequate for Discharge 06/04/2019 1105 by Baldomero Lamy, RN Outcome: Adequate for Discharge 06/04/2019 0741 by Baldomero Lamy, RN Outcome: Adequate for Discharge   Problem: Health Behavior/Discharge Planning: Goal: Ability to manage health-related needs will improve 06/04/2019 1105 by Baldomero Lamy, RN Outcome: Adequate for Discharge 06/04/2019 1105 by Baldomero Lamy, RN Outcome: Adequate for Discharge 06/04/2019 0741 by Baldomero Lamy, RN Outcome: Adequate for Discharge   Problem: Clinical Measurements: Goal: Ability to maintain clinical measurements within normal limits will improve 06/04/2019 1105 by Baldomero Lamy, RN Outcome: Adequate for Discharge 06/04/2019 1105 by Baldomero Lamy, RN Outcome: Adequate for Discharge 06/04/2019 0741 by Baldomero Lamy, RN Outcome: Adequate for Discharge Goal: Will remain free from infection 06/04/2019 1105 by Baldomero Lamy, RN Outcome: Adequate for Discharge 06/04/2019 1105 by Baldomero Lamy, RN Outcome: Adequate for Discharge 06/04/2019 0741 by Baldomero Lamy, RN Outcome: Adequate for Discharge Goal: Diagnostic test results will improve 06/04/2019 1105 by Baldomero Lamy, RN Outcome: Adequate for Discharge 06/04/2019 1105 by Baldomero Lamy, RN Outcome: Adequate for Discharge 06/04/2019 0741 by Baldomero Lamy, RN Outcome: Adequate for Discharge Goal: Respiratory complications will improve 06/04/2019 1105 by Baldomero Lamy, RN Outcome: Adequate for Discharge 06/04/2019 1105 by Baldomero Lamy, RN Outcome: Adequate for Discharge 06/04/2019 0741 by Baldomero Lamy, RN Outcome: Adequate for Discharge Goal: Cardiovascular complication will be avoided 06/04/2019 1105 by Baldomero Lamy, RN Outcome: Adequate for  Discharge 06/04/2019 1105 by Baldomero Lamy, RN Outcome: Adequate for Discharge 06/04/2019 0741 by Baldomero Lamy, RN Outcome: Adequate for Discharge   Problem: Activity: Goal: Risk for activity intolerance will decrease 06/04/2019 1105 by Baldomero Lamy, RN Outcome: Adequate for Discharge 06/04/2019 1105 by Baldomero Lamy, RN Outcome: Adequate for Discharge 06/04/2019 0741 by Baldomero Lamy, RN Outcome: Adequate for Discharge   Problem: Nutrition: Goal: Adequate nutrition will be maintained 06/04/2019 1105 by Baldomero Lamy, RN Outcome: Adequate for Discharge 06/04/2019 1105 by Baldomero Lamy, RN Outcome: Adequate for Discharge 06/04/2019 0741 by Baldomero Lamy, RN Outcome: Adequate for Discharge   Problem: Coping: Goal: Level of anxiety will decrease 06/04/2019 1105 by Baldomero Lamy, RN Outcome: Adequate for Discharge 06/04/2019 1105 by Baldomero Lamy, RN Outcome: Adequate for Discharge 06/04/2019 0741 by Baldomero Lamy, RN Outcome: Adequate for Discharge   Problem: Elimination: Goal: Will not experience complications related to bowel motility 06/04/2019 1105 by Baldomero Lamy, RN Outcome: Adequate for Discharge 06/04/2019 1105 by Baldomero Lamy, RN Outcome: Adequate for Discharge 06/04/2019 0741 by Baldomero Lamy, RN Outcome: Adequate for Discharge Goal: Will not experience complications related to urinary retention 06/04/2019 1105 by Baldomero Lamy, RN Outcome: Adequate for Discharge 06/04/2019 1105 by Baldomero Lamy, RN Outcome: Adequate for Discharge 06/04/2019 0741 by Baldomero Lamy, RN Outcome: Adequate for Discharge   Problem: Pain Managment: Goal: General experience of comfort will improve 06/04/2019 1105 by Baldomero Lamy, RN Outcome: Adequate for Discharge 06/04/2019 1105 by Baldomero Lamy, RN Outcome: Adequate for Discharge 06/04/2019 0741 by Baldomero Lamy, RN Outcome: Adequate for Discharge   Problem: Safety: Goal: Ability to remain free from injury will  improve 06/04/2019 1105 by Baldomero Lamy, RN Outcome: Adequate for Discharge 06/04/2019 1105 by Baldomero Lamy,  RN Outcome: Adequate for Discharge 06/04/2019 0741 by Baldomero Lamy, RN Outcome: Adequate for Discharge   Problem: Skin Integrity: Goal: Risk for impaired skin integrity will decrease 06/04/2019 1105 by Baldomero Lamy, RN Outcome: Adequate for Discharge 06/04/2019 1105 by Baldomero Lamy, RN Outcome: Adequate for Discharge 06/04/2019 0741 by Baldomero Lamy, RN Outcome: Adequate for Discharge   Problem: Education: Goal: Verbalization of understanding the information provided (i.e., activity precautions, restrictions, etc) will improve 06/04/2019 1105 by Baldomero Lamy, RN Outcome: Adequate for Discharge 06/04/2019 1105 by Baldomero Lamy, RN Outcome: Adequate for Discharge 06/04/2019 0741 by Baldomero Lamy, RN Outcome: Adequate for Discharge Goal: Individualized Educational Video(s) 06/04/2019 1105 by Baldomero Lamy, RN Outcome: Adequate for Discharge 06/04/2019 1105 by Baldomero Lamy, RN Outcome: Adequate for Discharge 06/04/2019 0741 by Baldomero Lamy, RN Outcome: Adequate for Discharge   Problem: Activity: Goal: Ability to ambulate and perform ADLs will improve 06/04/2019 1105 by Baldomero Lamy, RN Outcome: Adequate for Discharge 06/04/2019 1105 by Baldomero Lamy, RN Outcome: Adequate for Discharge 06/04/2019 0741 by Baldomero Lamy, RN Outcome: Adequate for Discharge   Problem: Clinical Measurements: Goal: Postoperative complications will be avoided or minimized 06/04/2019 1105 by Baldomero Lamy, RN Outcome: Adequate for Discharge 06/04/2019 1105 by Baldomero Lamy, RN Outcome: Adequate for Discharge 06/04/2019 0741 by Baldomero Lamy, RN Outcome: Adequate for Discharge   Problem: Self-Concept: Goal: Ability to maintain and perform role responsibilities to the fullest extent possible will improve 06/04/2019 1105 by Baldomero Lamy, RN Outcome: Adequate for  Discharge 06/04/2019 1105 by Baldomero Lamy, RN Outcome: Adequate for Discharge 06/04/2019 0741 by Baldomero Lamy, RN Outcome: Adequate for Discharge   Problem: Pain Management: Goal: Pain level will decrease 06/04/2019 1105 by Baldomero Lamy, RN Outcome: Adequate for Discharge 06/04/2019 1105 by Baldomero Lamy, RN Outcome: Adequate for Discharge 06/04/2019 0741 by Baldomero Lamy, RN Outcome: Adequate for Discharge

## 2019-06-05 ENCOUNTER — Non-Acute Institutional Stay (SKILLED_NURSING_FACILITY): Payer: Medicare Other | Admitting: Internal Medicine

## 2019-06-05 ENCOUNTER — Encounter: Payer: Self-pay | Admitting: Internal Medicine

## 2019-06-05 DIAGNOSIS — J9611 Chronic respiratory failure with hypoxia: Secondary | ICD-10-CM | POA: Diagnosis not present

## 2019-06-05 DIAGNOSIS — S72142D Displaced intertrochanteric fracture of left femur, subsequent encounter for closed fracture with routine healing: Secondary | ICD-10-CM | POA: Diagnosis not present

## 2019-06-05 DIAGNOSIS — J309 Allergic rhinitis, unspecified: Secondary | ICD-10-CM

## 2019-06-05 DIAGNOSIS — I2510 Atherosclerotic heart disease of native coronary artery without angina pectoris: Secondary | ICD-10-CM

## 2019-06-05 DIAGNOSIS — I2584 Coronary atherosclerosis due to calcified coronary lesion: Secondary | ICD-10-CM

## 2019-06-05 DIAGNOSIS — H3530 Unspecified macular degeneration: Secondary | ICD-10-CM

## 2019-06-05 NOTE — Progress Notes (Deleted)
: Provider:  Hennie Duos, MD Location:  [facility] Room Number: 621-H Place of Service:  SNF [phone]  PCP: Orpah Melter, MD Patient Care Team: Orpah Melter, MD as PCP - General (Internal Medicine) Brand Males, MD as Consulting Physician (Pulmonary Disease) Rolm Bookbinder, MD as Consulting Physician (General Surgery) Magrinat, Virgie Dad, MD as Consulting Physician (Oncology) Gery Pray, MD as Consulting Physician (Radiation Oncology) Gerda Diss, DO as Consulting Physician [facility] Medicine)  Extended Emergency Contact Information Primary Emergency Contact: Shona Needles, [place] [address] [address] of [place] Phone: [phone] Mobile Phone: [phone] Relation: Son Secondary Emergency Contact: Eliya, [place], [place] [address] [address] of [place] Phone: [phone] Relation: Daughter     Allergies: Tiotropium bromide monohydrate, Codeine, Fosamax [alendronate], Asa [aspirin], and Latex  Chief Complaint  Patient presents with  . New Admit To SNF    New admit to [facility] SNF    HPI: Patient is an 85 y.o. female   Past Medical History:  Diagnosis Date  . Arthritis    "joints" (04/11/2017)  . Asthma   . Breast cancer, right breast ([place])    S/P RIGHT BREAST LUMPECTOMY WITH RADIOACTIVE SEED LOCALIZATION 04/11/2017  . Chronic lower back pain    "if I stand too long" (04/11/2017)  . Colon adenomas   . Complication of anesthesia    Must sit upright 45-60 degree angle per pulmonology  . COPD (chronic obstructive pulmonary disease) ([place])    "severe" (04/11/2017)  . Coronary artery calcification 09/01/2016  . Dysrhythmia    bigeminy  . GERD (gastroesophageal reflux disease)   . Goiter   . History of duodenal ulcer   . History of radiation therapy 05/24/11-06/02/11   R middle lobe lung/ 62 gray  . Hx of colonic polyps   . Hyperlipidemia   . Lung cancer, middle lobe ([place]) 04/26/2011    S/P radiation 05/24/11-06/02/11  . On home oxygen therapy    "3L; all the time" (04/11/2017)  . Osteoporosis   . Osteoporosis   . Palpitations 01/19/2016   a. event monitor in 01/2016 showed PVC's with one episode of ventricular quadrigeminy.  . Pneumonia 2017 X 2  . Pollen allergy   . PVC (premature ventricular contraction) 02/24/2016  . Shortness of breath   . Wears glasses     Past Surgical History:  Procedure Laterality Date  . APPENDECTOMY    . BIOPSY THYROID  2016  . BREAST BIOPSY Right 03/2017  . BREAST LUMPECTOMY WITH RADIOACTIVE SEED LOCALIZATION Right 04/11/2017  . BREAST LUMPECTOMY WITH RADIOACTIVE SEED LOCALIZATION Right 04/11/2017   Procedure: RIGHT BREAST LUMPECTOMY WITH RADIOACTIVE SEED LOCALIZATION;  Surgeon: Rolm Bookbinder, MD;  Location: [facility];  Service: General;  Laterality: Right;  . CATARACT EXTRACTION W/ INTRAOCULAR LENS  IMPLANT, BILATERAL Bilateral   . COLONOSCOPY W/ BIOPSIES    . DILATION AND CURETTAGE OF UTERUS  "many"  . INTRAMEDULLARY (IM) NAIL INTERTROCHANTERIC Left 05/29/2019   Procedure: INTRAMEDULLARY (IM) NAIL INTERTROCHANTRIC;  Surgeon: Renette Butters, MD;  Location: [place];  Service: Orthopedics;  Laterality: Left;  . TONSILLECTOMY    . TOTAL ABDOMINAL HYSTERECTOMY      Allergies as of 06/05/2019      Reactions   Tiotropium Bromide Monohydrate Hives, Shortness Of Breath, Rash   Spiriva    Codeine Other (See Comments)   Hyperactivity,crying   Fosamax [alendronate] Other (See Comments)  Muscle aches   Asa [aspirin] Other (See Comments)   Bleeding -ulcers   Latex Rash      Medication List       Accurate as of June 05, 2019 11:11 AM. If you have any questions, ask your nurse or doctor.        bisacodyl 10 MG suppository Commonly known as: DULCOLAX Place 10 mg rectally daily as needed for moderate constipation (If not relieved by MOM).   cetirizine 10 MG tablet Commonly known as: ZYRTEC Take 10 mg by mouth daily.     enoxaparin 40 MG/0.4ML injection Commonly known as: LOVENOX Inject 0.4 mLs (40 mg total) into the skin daily for 30 doses. For 30 days post op for DVT prophylaxis   feeding supplement (ENSURE ENLIVE) Liqd Take 237 mLs by mouth 2 (two) times daily between meals.   HYDROcodone-acetaminophen 5-325 MG tablet Commonly known as: Norco Take 1 tablet by mouth every 6 (six) hours as needed for severe pain.   ipratropium-albuterol 0.5-2.5 (3) MG/3ML Soln Commonly known as: DUONEB Take 3 mLs by nebulization 4 (four) times daily.   magnesium hydroxide 400 MG/5ML suspension Commonly known as: MILK OF MAGNESIA Take 30 mLs by mouth daily as needed for mild constipation.   metoprolol tartrate 25 MG tablet Commonly known as: LOPRESSOR TAKE 1/2 (ONE-HALF) TABLET BY MOUTH TWICE DAILY   OXYGEN Inhale into the lungs. 3L at rest and 4pulse with exertion   predniSONE 20 MG tablet Commonly known as: DELTASONE Take 1 tablet (20 mg total) by mouth daily with breakfast.   PreserVision AREDS 2 Caps Take 1 tablet by mouth 2 (two) times a day.   RA SALINE ENEMA RE Place 1 each rectally daily as needed (Constipation).   sennosides-docusate sodium 8.6-50 MG tablet Commonly known as: SENOKOT-S Take 1 tablet by mouth 2 (two) times daily.   Systane Complete 0.6 % Soln Generic drug: Propylene Glycol Place 1 drop into both eyes 2 (two) times daily.       No orders of the defined types were placed in this encounter.   Immunization History  Administered Date(s) Administered  . Influenza Split 04/07/2013, 03/07/2014  . Influenza Whole 02/06/2011, 03/07/2012  . Influenza, High Dose Seasonal PF 03/07/2016, 02/05/2017, 02/09/2018  . Influenza,inj,Quad PF,6+ Mos 01/27/2015  . Influenza-Unspecified 01/05/2014, 03/08/2019  . Pneumococcal Conjugate-13 10/05/2013  . Pneumococcal Polysaccharide-23 06/08/2003  . Pneumococcal-Unspecified 11/05/2012    Social History   Tobacco Use  . Smoking status:  Former Smoker    Packs/day: 0.50    Years: 40.00    Pack years: 20.00    Types: Cigarettes    Quit date: 06/07/1998    Years since quitting: 21.0  . Smokeless tobacco: Never Used  Substance Use Topics  . Alcohol use: No    Family history is   Family History  Problem Relation Age of Onset  . Emphysema Maternal Uncle   . Heart disease Mother   . Heart disease Maternal Grandfather   . Rheum arthritis Sister   . Cancer Sister   . Cancer Sister        throat cancer  . Brain cancer Sister   . Cancer Sister   . Ovarian cancer Sister   . Breast cancer Daughter       Review of Systems  DATA OBTAINED: from patient, nurse, medical record, family member GENERAL:  no fevers, fatigue, appetite changes SKIN: No itching, or rash EYES: No eye pain, redness, discharge EARS: No earache, tinnitus, change in hearing  NOSE: No congestion, drainage or bleeding  MOUTH/THROAT: No mouth or tooth pain, No sore throat RESPIRATORY: No cough, wheezing, SOB CARDIAC: No chest pain, palpitations, lower extremity edema  GI: No abdominal pain, No N/V/D or constipation, No heartburn or reflux  GU: No dysuria, frequency or urgency, or incontinence  MUSCULOSKELETAL: No unrelieved bone/joint pain NEUROLOGIC: No headache, dizziness or focal weakness PSYCHIATRIC: No c/o anxiety or sadness   Vitals:   06/05/19 1031  BP: 106/60  Pulse: 84  Resp: 17  Temp: (!) 97 F (36.1 C)    SpO2 Readings from Last 1 Encounters:  06/04/19 97%   Body mass index is 21.79 kg/m.     Physical Exam  GENERAL APPEARANCE: Alert, conversant,  No acute distress.  SKIN: No diaphoresis rash HEAD: Normocephalic, atraumatic  EYES: Conjunctiva/lids clear. Pupils round, reactive. EOMs intact.  EARS: External exam WNL, canals clear. Hearing grossly normal.  NOSE: No deformity or discharge.  MOUTH/THROAT: Lips w/o lesions  RESPIRATORY: Breathing is even, unlabored. Lung sounds are clear   CARDIOVASCULAR: Heart RRR no  murmurs, rubs or gallops. No peripheral edema.   GASTROINTESTINAL: Abdomen is soft, non-tender, not distended w/ normal bowel sounds. GENITOURINARY: Bladder non tender, not distended  MUSCULOSKELETAL: No abnormal joints or musculature NEUROLOGIC:  Cranial nerves 2-12 grossly intact. Moves all extremities  PSYCHIATRIC: Mood and affect appropriate to situation, no behavioral issues  Patient Active Problem List   Diagnosis Date Noted  . Closed left hip fracture ([place]) 05/27/2019  . Palliative care by specialist   . Fall   . SOB (shortness of breath)   . Encounter for palliative care   . Goals of care, counseling/discussion   . Shortness of breath   . Acute on chronic respiratory failure with hypoxia and hypercapnia ([place]) 11/30/2018  . Sinusitis 12/12/2017  . 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 Coronary artery calcification 09/01/2016  . Bilateral low back pain with sciatica 05/10/2016  . Bronchopneumonia 05/10/2016  . CAP (community acquired pneumonia) 03/31/2016  . Chest pain 03/31/2016  . Tachycardia 03/31/2016  . PVC (premature ventricular contraction) 02/24/2016  . Palpitations 01/19/2016  . Respiratory failure ([place]) 06/11/2015  . Acute on chronic respiratory failure ([place]) 06/11/2015  . Knee pain, right 07/02/2014  . Chronic respiratory failure with hypoxia ([place]) 07/02/2014  . COPD, very severe ([place]) 07/02/2014  . COPD exacerbation ([place]) 06/13/2013  . Right rib fracture 05/15/2012  . Fatigue 07/22/2011  . Oral thrush 07/22/2011  . Lung cancer, middle lobe ([place]) 04/26/2011  . COPD (chronic obstructive pulmonary disease) ([place]) 01/13/2011      Labs reviewed: Basic Metabolic Panel:    Component Value Date/Time   NA 135 05/27/2019 0734   NA 143 03/16/2017 0823   K 4.1 05/27/2019 0734   K 4.6 03/16/2017 0823   CL 96 (L) 05/27/2019 0734   CO2 31 05/27/2019 0734   CO2 35 (H) 03/16/2017 0823   GLUCOSE 109 (H) 05/27/2019 0734   GLUCOSE 91  03/16/2017 0823   BUN 8 05/27/2019 0734   BUN 9.9 03/16/2017 0823   CREATININE 0.53 05/29/2019 1828   CREATININE 0.7 03/16/2017 0823   CALCIUM 9.0 05/27/2019 0734   CALCIUM 10.0 03/16/2017 0823   PROT 5.2 (L) 05/26/2019 1520   PROT 6.9 03/16/2017 0823   ALBUMIN 3.2 (L) 05/26/2019 1520   ALBUMIN 3.9 03/16/2017 0823   AST 12 (L) 05/26/2019 1520   AST 19 03/16/2017 0823   ALT 17 05/26/2019 1520   ALT  19 03/16/2017 0823   ALKPHOS 99 05/26/2019 1520   ALKPHOS 53 03/16/2017 0823   BILITOT 0.5 05/26/2019 1520   BILITOT 0.55 03/16/2017 0823   GFRNONAA >60 05/29/2019 1828   GFRAA >60 05/29/2019 1828    Recent Labs    12/05/18 0550 05/26/19 1520 05/27/19 0734 05/28/19 0522 05/29/19 0405 05/29/19 1828 05/30/19 0428  NA 140 138 135  --   --   --   --   K 3.9 4.0 4.1  --   --   --   --   CL 98 101 96*  --   --   --   --   CO2 32 31 31  --   --   --   --   GLUCOSE 107* 156* 109*  --   --   --   --   BUN 11 12 8   --   --   --   --   CREATININE 0.31* 0.30* 0.42*  --   --  0.53  --   CALCIUM 8.7* 8.4* 9.0  --   --   --   --   MG 2.2  --   --  2.0 1.8  --  1.8  PHOS 3.4  --   --  2.9 2.8  --  3.3   Liver Function Tests: Recent Labs    11/24/18 0452 05/26/19 1520  AST 28 12*  ALT 124* 17  ALKPHOS 100 99  BILITOT 1.0 0.5  PROT 7.0 5.2*  ALBUMIN 2.9* 3.2*   No results for input(s): LIPASE, AMYLASE in the last 8760 hours. No results for input(s): AMMONIA in the last 8760 hours. CBC: Recent Labs    11/30/18 0235 12/05/18 0550 05/26/19 1520 05/27/19 0734 05/28/19 0522 05/29/19 1828  WBC 23.6* 16.1* 11.0* 12.6* 12.6* 16.6*  NEUTROABS 20.6* 13.4* 10.0*  --   --   --   HGB 9.7* 10.0* 12.0 11.1* 11.0* 10.3*  HCT 32.4* 34.6* 39.3 35.3* 33.5* 32.9*  MCV 92.3 91.3 89.7 86.3 85.5 87.0  PLT 229 250 181 188 204 242   Lipid No results for input(s): CHOL, HDL, LDLCALC, TRIG in the last 8760 hours.  Cardiac Enzymes: Recent Labs    11/24/18 0452  TROPONINI 0.05*    BNP: Recent Labs    11/24/18 0452  BNP 119.0*   No results found for: [facility] Lab Results  Component Value Date   HGBA1C 5.6 11/25/2018   Lab Results  Component Value Date   TSH 1.13 01/21/2016   No results found for: VITAMINB12 No results found for: FOLATE No results found for: IRON, TIBC, FERRITIN  Imaging and Procedures obtained prior to SNF admission: DG Hip Unilat W or Wo Pelvis 2-3 Views Left  Result Date: 05/26/2019 CLINICAL DATA:  Witnessed fall, shortening and lateral rotation of the left leg, history of osteoporosis EXAM: DG HIP (WITH OR WITHOUT PELVIS) 2-3V LEFT COMPARISON:  None FINDINGS: Comminuted intertrochanteric fracture of the left proximal femur with valgus deformity and superior migration of the distal fracture fragment. There is some extension of the fracture into the subtrochanteric femur. No additional fracture is identified. Lateral view is limited due to overlying material but suggest posterior displacement also of the distal fracture component. IMPRESSION: Comminuted intertrochanteric fracture of the left proximal femur with valgus deformity and superior and posterior displacement of distal fracture elements with extension of the fracture into the subtrochanteric femur. Electronically Signed   By: Zetta Bills M.D.   On: 05/26/2019 15:29  Not all labs, radiology exams or other studies done during hospitalization come through on my EPIC note; however they are reviewed by me.    Assessment and Plan  No problem-specific Assessment & Plan notes found for this encounter.   Hennie Duos, MD '

## 2019-06-06 ENCOUNTER — Other Ambulatory Visit: Payer: Self-pay | Admitting: *Deleted

## 2019-06-06 ENCOUNTER — Other Ambulatory Visit: Payer: Self-pay | Admitting: Internal Medicine

## 2019-06-06 MED ORDER — HYDROCODONE-ACETAMINOPHEN 5-325 MG PO TABS: 1.0000 | ORAL_TABLET | Freq: Two times a day (BID) | ORAL | 0 refills | Status: DC

## 2019-06-06 NOTE — Patient Outreach (Signed)
Screened for potential [facility] Care Management needs as a benefit of  [employer] Medicare.  Mrs. Obarr is currently receiving skilled therapy at [facility].  Writer attended  telephonic interdisciplinary team meeting to assess for disposition needs and transition plan for resident.   Member was active with hospice services prior. Facility dc planner to follow up on palliative care services while in SNF.  Writer will continue to follow for disposition plans.   Marthenia Rolling, MSN-Ed, RN,BSN [place] Acute Care Coordinator [phone] [facility]) [phone]  (Toll free office)

## 2019-06-06 NOTE — Progress Notes (Signed)
This encounter was created in error - please disregard.

## 2019-06-06 NOTE — Progress Notes (Signed)
: Provider:  Hennie Duos., MD Location:  [place] Room Number: 161-W Place of Service:  SNF [phone])  PCP: Orpah Melter, MD Patient Care Team: Orpah Melter, MD as PCP - General (Internal Medicine) Brand Males, MD as Consulting Physician (Pulmonary Disease) Rolm Bookbinder, MD as Consulting Physician (General Surgery) Magrinat, Virgie Dad, MD as Consulting Physician (Oncology) Gery Pray, MD as Consulting Physician (Radiation Oncology) Gerda Diss, DO as Consulting Physician [facility] Medicine)  Extended Emergency Contact Information Primary Emergency Contact: Shona Needles, [place] [address] Johnnette Litter of [name] Phone: [phone] Mobile Phone: [phone] Relation: Son Secondary Emergency Contact: Alexandera, [place], [place] [address] Johnnette Litter of [place] Phone: [phone] Relation: Daughter     Allergies: Tiotropium bromide monohydrate, Codeine, Fosamax [alendronate], Asa [aspirin], and Latex  Chief Complaint  Patient presents with  . New Admit To SNF    New admission to [facility] SNF    HPI: Patient is an 85 y.o. female with history of PVCs, osteoporosis, lung cancer, completed treatment, breast cancer, completed treatment, CAD, arthritis, and COPD on 3 L, on home hospice for this diagnosis who presented to [facility] after mechanical fall using her walker.  She noted that she had been in bed for the past 3 weeks due to back spasms and has been unable to walk.  Subsequently she was feeling weak and when she got up she felt.  In the ED patient was found to have a comminuted intertrochanteric fracture of the left proximal femur.  She is admitted to [facility] from 12/19-28 where she underwent intramedullary nail for pain control.  Patient had an uneventful postop course.  Patient is admitted to skilled nursing facility for OT/PT.  While at skilled nursing facility  patient will be followed for chronic respiratory failure with hypoxia treated with oxygen, and prednisone, macular degeneration treated with PreserVision AREDS and coronary artery disease treated with metoprolol.  Past Medical History:  Diagnosis Date  . Arthritis    "joints" (04/11/2017)  . Asthma   . Breast cancer, right breast ([place])    S/P RIGHT BREAST LUMPECTOMY WITH RADIOACTIVE SEED LOCALIZATION 04/11/2017  . Chronic lower back pain    "if I stand too long" (04/11/2017)  . Colon adenomas   . Complication of anesthesia    Must sit upright 45-60 degree angle per pulmonology  . COPD (chronic obstructive pulmonary disease) ([place])    "severe" (04/11/2017)  . Coronary artery calcification 09/01/2016  . Dysrhythmia    bigeminy  . GERD (gastroesophageal reflux disease)   . Goiter   . History of duodenal ulcer   . History of radiation therapy 05/24/11-06/02/11   R middle lobe lung/ 60 gray  . Hx of colonic polyps   . Hyperlipidemia   . Lung cancer, middle lobe ([place]) 04/26/2011   S/P radiation 05/24/11-06/02/11  . On home oxygen therapy    "3L; all the time" (04/11/2017)  . Osteoporosis   . Osteoporosis   . Palpitations 01/19/2016   a. event monitor in 01/2016 showed PVC's with one episode of ventricular quadrigeminy.  . Pneumonia 2017 X 2  . Pollen allergy   . PVC (premature ventricular contraction) 02/24/2016  . Shortness of breath   . Wears glasses     Past Surgical History:  Procedure Laterality Date  . APPENDECTOMY    . BIOPSY THYROID  2016  .  BREAST BIOPSY Right 03/2017  . BREAST LUMPECTOMY WITH RADIOACTIVE SEED LOCALIZATION Right 04/11/2017  . BREAST LUMPECTOMY WITH RADIOACTIVE SEED LOCALIZATION Right 04/11/2017   Procedure: RIGHT BREAST LUMPECTOMY WITH RADIOACTIVE SEED LOCALIZATION;  Surgeon: Rolm Bookbinder, MD;  Location: [place];  Service: General;  Laterality: Right;  . CATARACT EXTRACTION W/ INTRAOCULAR LENS  IMPLANT, BILATERAL Bilateral   . COLONOSCOPY W/ BIOPSIES     . DILATION AND CURETTAGE OF UTERUS  "many"  . INTRAMEDULLARY (IM) NAIL INTERTROCHANTERIC Left 05/29/2019   Procedure: INTRAMEDULLARY (IM) NAIL INTERTROCHANTRIC;  Surgeon: Renette Butters, MD;  Location: [place];  Service: Orthopedics;  Laterality: Left;  . TONSILLECTOMY    . TOTAL ABDOMINAL HYSTERECTOMY      Allergies as of 06/05/2019      Reactions   Tiotropium Bromide Monohydrate Hives, Shortness Of Breath, Rash   Spiriva    Codeine Other (See Comments)   Hyperactivity,crying   Fosamax [alendronate] Other (See Comments)   Muscle aches   Asa [aspirin] Other (See Comments)   Bleeding -ulcers   Latex Rash      Medication List       Accurate as of June 05, 2019 11:59 PM. If you have any questions, ask your nurse or doctor.        bisacodyl 10 MG suppository Commonly known as: DULCOLAX Place 10 mg rectally daily as needed for moderate constipation (If not relieved by MOM).   cetirizine 10 MG tablet Commonly known as: ZYRTEC Take 10 mg by mouth daily.   enoxaparin 40 MG/0.4ML injection Commonly known as: LOVENOX Inject 0.4 mLs (40 mg total) into the skin daily for 30 doses. For 30 days post op for DVT prophylaxis   feeding supplement (ENSURE ENLIVE) Liqd Take 237 mLs by mouth 2 (two) times daily between meals.   HYDROcodone-acetaminophen 5-325 MG tablet Commonly known as: Norco Take 1 tablet by mouth every 6 (six) hours as needed for severe pain.   ipratropium-albuterol 0.5-2.5 (3) MG/3ML Soln Commonly known as: DUONEB Take 3 mLs by nebulization 4 (four) times daily.   magnesium hydroxide 400 MG/5ML suspension Commonly known as: MILK OF MAGNESIA Take 30 mLs by mouth daily as needed for mild constipation.   metoprolol tartrate 25 MG tablet Commonly known as: LOPRESSOR TAKE 1/2 (ONE-HALF) TABLET BY MOUTH TWICE DAILY   OXYGEN Inhale into the lungs. 3L at rest and 4pulse with exertion   predniSONE 20 MG tablet Commonly known as: DELTASONE Take 1 tablet (20  mg total) by mouth daily with breakfast.   PreserVision AREDS 2 Caps Take 1 tablet by mouth 2 (two) times a day.   RA SALINE ENEMA RE Place 1 each rectally daily as needed (Constipation).   sennosides-docusate sodium 8.6-50 MG tablet Commonly known as: SENOKOT-S Take 1 tablet by mouth 2 (two) times daily.   Systane Complete 0.6 % Soln Generic drug: Propylene Glycol Place 1 drop into both eyes 2 (two) times daily.       No orders of the defined types were placed in this encounter.   Immunization History  Administered Date(s) Administered  . Influenza Split 04/07/2013, 03/07/2014  . Influenza Whole 02/06/2011, 03/07/2012  . Influenza, High Dose Seasonal PF 03/07/2016, 02/05/2017, 02/09/2018  . Influenza,inj,Quad PF,6+ Mos 01/27/2015  . Influenza-Unspecified 01/05/2014, 03/08/2019  . Pneumococcal Conjugate-13 10/05/2013  . Pneumococcal Polysaccharide-23 06/08/2003  . Pneumococcal-Unspecified 11/05/2012    Social History   Tobacco Use  . Smoking status: Former Smoker    Packs/day: 0.50    Years: 40.00  Pack years: 20.00    Types: Cigarettes    Quit date: 06/07/1998    Years since quitting: 21.0  . Smokeless tobacco: Never Used  Substance Use Topics  . Alcohol use: No    Family history is   Family History  Problem Relation Age of Onset  . Emphysema Maternal Uncle   . Heart disease Mother   . Heart disease Maternal Grandfather   . Rheum arthritis Sister   . Cancer Sister   . Cancer Sister        throat cancer  . Brain cancer Sister   . Cancer Sister   . Ovarian cancer Sister   . Breast cancer Daughter       Review of Systems   GENERAL:  no fevers, fatigue, appetite changes SKIN: No itching, or rash EYES: No eye pain, redness, discharge EARS: No earache, tinnitus, change in hearing NOSE: No congestion, drainage or bleeding  MOUTH/THROAT: No mouth or tooth pain, No sore throat RESPIRATORY: No cough, wheezing SOB at baseline on 3 L CARDIAC: No chest  pain, palpitations, lower extremity edema  GI: No abdominal pain, No N/V/D or constipation, No heartburn or reflux  GU: No dysuria, frequency or urgency, or incontinence  MUSCULOSKELETAL: No unrelieved bone/joint pain NEUROLOGIC: No headache, dizziness or focal weakness PSYCHIATRIC: No c/o anxiety or sadness   Vitals:   06/05/19 0844  BP: 106/60  Pulse: 84  Resp: 17  Temp: (!) 97 F (36.1 C)    SpO2 Readings from Last 1 Encounters:  06/04/19 97%   Body mass index is 21.79 kg/m.     Physical Exam  GENERAL APPEARANCE: Alert, conversant,  No acute distress, some shortness of breath at rest.  SKIN: No diaphoresis rash HEAD: Normocephalic, atraumatic  EYES: Conjunctiva/lids clear. Pupils round, reactive. EOMs intact.  EARS: External exam WNL, canals clear. Hearing grossly normal.  NOSE: No deformity or discharge.  MOUTH/THROAT: Lips w/o lesions  RESPIRATORY: Breathing is even, unlabored. Lung sounds are diffusely decreased CARDIOVASCULAR: Heart RRR no murmurs, rubs or gallops. No peripheral edema.   GASTROINTESTINAL: Abdomen is soft, non-tender, not distended w/ normal bowel sounds. GENITOURINARY: Bladder non tender, not distended  MUSCULOSKELETAL: No abnormal joints or musculature NEUROLOGIC:  Cranial nerves 2-12 grossly intact. Moves all extremities  PSYCHIATRIC: Mood and affect appropriate to situation, no behavioral issues  Patient Active Problem List   Diagnosis Date Noted  . Closed left hip fracture ([place]) 05/27/2019  . Palliative care by specialist   . Fall   . SOB (shortness of breath)   . Encounter for palliative care   . Goals of care, counseling/discussion   . Shortness of breath   . Acute on chronic respiratory failure with hypoxia and hypercapnia ([place]) 11/30/2018  . Sinusitis 12/12/2017  . 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 Coronary artery calcification 09/01/2016  . Bilateral low back pain with sciatica 05/10/2016  .  Bronchopneumonia 05/10/2016  . CAP (community acquired pneumonia) 03/31/2016  . Chest pain 03/31/2016  . Tachycardia 03/31/2016  . PVC (premature ventricular contraction) 02/24/2016  . Palpitations 01/19/2016  . Respiratory failure ([place]) 06/11/2015  . Acute on chronic respiratory failure ([place]) 06/11/2015  . Knee pain, right 07/02/2014  . Chronic respiratory failure with hypoxia ([place]) 07/02/2014  . COPD, very severe ([place]) 07/02/2014  . COPD exacerbation ([place]) 06/13/2013  . Right rib fracture 05/15/2012  . Fatigue 07/22/2011  . Oral thrush 07/22/2011  . Lung cancer, middle lobe ([place]) 04/26/2011  . COPD (  chronic obstructive pulmonary disease) ([place]) 01/13/2011      Labs reviewed: Basic Metabolic Panel:    Component Value Date/Time   NA 135 05/27/2019 0734   NA 143 03/16/2017 0823   K 4.1 05/27/2019 0734   K 4.6 03/16/2017 0823   CL 96 (L) 05/27/2019 0734   CO2 31 05/27/2019 0734   CO2 35 (H) 03/16/2017 0823   GLUCOSE 109 (H) 05/27/2019 0734   GLUCOSE 91 03/16/2017 0823   BUN 8 05/27/2019 0734   BUN 9.9 03/16/2017 0823   CREATININE 0.53 05/29/2019 1828   CREATININE 0.7 03/16/2017 0823   CALCIUM 9.0 05/27/2019 0734   CALCIUM 10.0 03/16/2017 0823   PROT 5.2 (L) 05/26/2019 1520   PROT 6.9 03/16/2017 0823   ALBUMIN 3.2 (L) 05/26/2019 1520   ALBUMIN 3.9 03/16/2017 0823   AST 12 (L) 05/26/2019 1520   AST 19 03/16/2017 0823   ALT 17 05/26/2019 1520   ALT 19 03/16/2017 0823   ALKPHOS 99 05/26/2019 1520   ALKPHOS 53 03/16/2017 0823   BILITOT 0.5 05/26/2019 1520   BILITOT 0.55 03/16/2017 0823   GFRNONAA >60 05/29/2019 1828   GFRAA >60 05/29/2019 1828    Recent Labs    12/05/18 0550 05/26/19 1520 05/27/19 0734 05/28/19 0522 05/29/19 0405 05/29/19 1828 05/30/19 0428  NA 140 138 135  --   --   --   --   K 3.9 4.0 4.1  --   --   --   --   CL 98 101 96*  --   --   --   --   CO2 32 31 31  --   --   --   --   GLUCOSE 107* 156* 109*  --   --   --   --   BUN 11 12 8   --    --   --   --   CREATININE 0.31* 0.30* 0.42*  --   --  0.53  --   CALCIUM 8.7* 8.4* 9.0  --   --   --   --   MG 2.2  --   --  2.0 1.8  --  1.8  PHOS 3.4  --   --  2.9 2.8  --  3.3   Liver Function Tests: Recent Labs    11/24/18 0452 05/26/19 1520  AST 28 12*  ALT 124* 17  ALKPHOS 100 99  BILITOT 1.0 0.5  PROT 7.0 5.2*  ALBUMIN 2.9* 3.2*   No results for input(s): LIPASE, AMYLASE in the last 8760 hours. No results for input(s): AMMONIA in the last 8760 hours. CBC: Recent Labs    11/30/18 0235 12/05/18 0550 05/26/19 1520 05/27/19 0734 05/28/19 0522 05/29/19 1828  WBC 23.6* 16.1* 11.0* 12.6* 12.6* 16.6*  NEUTROABS 20.6* 13.4* 10.0*  --   --   --   HGB 9.7* 10.0* 12.0 11.1* 11.0* 10.3*  HCT 32.4* 34.6* 39.3 35.3* 33.5* 32.9*  MCV 92.3 91.3 89.7 86.3 85.5 87.0  PLT 229 250 181 188 204 242   Lipid No results for input(s): CHOL, HDL, LDLCALC, TRIG in the last 8760 hours.  Cardiac Enzymes: Recent Labs    11/24/18 0452  TROPONINI 0.05*   BNP: Recent Labs    11/24/18 0452  BNP 119.0*   No results found for: [facility] Lab Results  Component Value Date   HGBA1C 5.6 11/25/2018   Lab Results  Component Value Date   TSH 1.13 01/21/2016   No results found  for: VITAMINB12 No results found for: FOLATE No results found for: IRON, TIBC, FERRITIN  Imaging and Procedures obtained prior to SNF admission: DG Hip Unilat W or Wo Pelvis 2-3 Views Left  Result Date: 05/26/2019 CLINICAL DATA:  Witnessed fall, shortening and lateral rotation of the left leg, history of osteoporosis EXAM: DG HIP (WITH OR WITHOUT PELVIS) 2-3V LEFT COMPARISON:  None FINDINGS: Comminuted intertrochanteric fracture of the left proximal femur with valgus deformity and superior migration of the distal fracture fragment. There is some extension of the fracture into the subtrochanteric femur. No additional fracture is identified. Lateral view is limited due to overlying material but suggest posterior  displacement also of the distal fracture component. IMPRESSION: Comminuted intertrochanteric fracture of the left proximal femur with valgus deformity and superior and posterior displacement of distal fracture elements with extension of the fracture into the subtrochanteric femur. Electronically Signed   By: Zetta Bills M.D.   On: 05/26/2019 15:29     Not all labs, radiology exams or other studies done during hospitalization come through on my EPIC note; however they are reviewed by me.    Assessment and Plan  Comminuted intertrochanteric fracture-status post IM nailing; prophylaxis with Lovenox 40 mg subcu 30 days SNF-admitted for OT/PT; continue Lovenox 40 mg subcu daily for 30 days as prophylaxis  Chronic respiratory failure with hypoxia-chronic 3 L nasal cannula and prednisone SNF-continue O2 3 L nasal cannula and 4 L with exertion; continue prednisone 20 mg daily  Coronary artery disease SNF-not stated as uncontrolled; continue metoprolol 12.5 mg twice daily  Macular degeneration SNF-not stated as active; continue PreserVision AREDS 2 caps 1 p.o. twice daily and Systane complete 1 drop each eye 2 times daily  Allergic rhinitis SNF-not stated as uncontrolled; continue Zyrtec 10 mg daily   Time spent greater than 45 minutes;> 50% of time with patient was spent reviewing records, labs, tests and studies, counseling and developing plan of care  Hennie Duos, MD

## 2019-06-07 DIAGNOSIS — Z23 Encounter for immunization: Secondary | ICD-10-CM | POA: Diagnosis not present

## 2019-06-08 DIAGNOSIS — S72002A Fracture of unspecified part of neck of left femur, initial encounter for closed fracture: Secondary | ICD-10-CM | POA: Diagnosis not present

## 2019-06-08 DIAGNOSIS — J449 Chronic obstructive pulmonary disease, unspecified: Secondary | ICD-10-CM | POA: Diagnosis not present

## 2019-06-08 DIAGNOSIS — I2584 Coronary atherosclerosis due to calcified coronary lesion: Secondary | ICD-10-CM | POA: Diagnosis not present

## 2019-06-08 DIAGNOSIS — Z85118 Personal history of other malignant neoplasm of bronchus and lung: Secondary | ICD-10-CM | POA: Diagnosis not present

## 2019-06-08 DIAGNOSIS — Z853 Personal history of malignant neoplasm of breast: Secondary | ICD-10-CM | POA: Diagnosis not present

## 2019-06-08 DIAGNOSIS — J440 Chronic obstructive pulmonary disease with acute lower respiratory infection: Secondary | ICD-10-CM | POA: Diagnosis not present

## 2019-06-08 DIAGNOSIS — M5440 Lumbago with sciatica, unspecified side: Secondary | ICD-10-CM | POA: Diagnosis not present

## 2019-06-08 DIAGNOSIS — M6281 Muscle weakness (generalized): Secondary | ICD-10-CM | POA: Diagnosis not present

## 2019-06-08 DIAGNOSIS — Z9181 History of falling: Secondary | ICD-10-CM | POA: Diagnosis not present

## 2019-06-08 DIAGNOSIS — M1990 Unspecified osteoarthritis, unspecified site: Secondary | ICD-10-CM | POA: Diagnosis not present

## 2019-06-08 DIAGNOSIS — J189 Pneumonia, unspecified organism: Secondary | ICD-10-CM | POA: Diagnosis not present

## 2019-06-08 DIAGNOSIS — Z7952 Long term (current) use of systemic steroids: Secondary | ICD-10-CM | POA: Diagnosis not present

## 2019-06-08 DIAGNOSIS — J302 Other seasonal allergic rhinitis: Secondary | ICD-10-CM | POA: Diagnosis not present

## 2019-06-08 DIAGNOSIS — R2681 Unsteadiness on feet: Secondary | ICD-10-CM | POA: Diagnosis not present

## 2019-06-08 DIAGNOSIS — C342 Malignant neoplasm of middle lobe, bronchus or lung: Secondary | ICD-10-CM | POA: Diagnosis not present

## 2019-06-08 DIAGNOSIS — E785 Hyperlipidemia, unspecified: Secondary | ICD-10-CM | POA: Diagnosis not present

## 2019-06-08 DIAGNOSIS — Z9981 Dependence on supplemental oxygen: Secondary | ICD-10-CM | POA: Diagnosis not present

## 2019-06-08 DIAGNOSIS — M810 Age-related osteoporosis without current pathological fracture: Secondary | ICD-10-CM | POA: Diagnosis not present

## 2019-06-08 DIAGNOSIS — Z6821 Body mass index (BMI) 21.0-21.9, adult: Secondary | ICD-10-CM | POA: Diagnosis not present

## 2019-06-08 DIAGNOSIS — I2510 Atherosclerotic heart disease of native coronary artery without angina pectoris: Secondary | ICD-10-CM | POA: Diagnosis not present

## 2019-06-08 DIAGNOSIS — K219 Gastro-esophageal reflux disease without esophagitis: Secondary | ICD-10-CM | POA: Diagnosis not present

## 2019-06-08 DIAGNOSIS — E7800 Pure hypercholesterolemia, unspecified: Secondary | ICD-10-CM | POA: Diagnosis not present

## 2019-06-08 DIAGNOSIS — H3530 Unspecified macular degeneration: Secondary | ICD-10-CM | POA: Diagnosis not present

## 2019-06-08 DIAGNOSIS — S72002D Fracture of unspecified part of neck of left femur, subsequent encounter for closed fracture with routine healing: Secondary | ICD-10-CM | POA: Diagnosis not present

## 2019-06-08 DIAGNOSIS — Z87891 Personal history of nicotine dependence: Secondary | ICD-10-CM | POA: Diagnosis not present

## 2019-06-08 DIAGNOSIS — S72142D Displaced intertrochanteric fracture of left femur, subsequent encounter for closed fracture with routine healing: Secondary | ICD-10-CM | POA: Diagnosis not present

## 2019-06-08 DIAGNOSIS — D0511 Intraductal carcinoma in situ of right breast: Secondary | ICD-10-CM | POA: Diagnosis not present

## 2019-06-08 DIAGNOSIS — M25561 Pain in right knee: Secondary | ICD-10-CM | POA: Diagnosis not present

## 2019-06-08 DIAGNOSIS — J301 Allergic rhinitis due to pollen: Secondary | ICD-10-CM | POA: Diagnosis not present

## 2019-06-08 DIAGNOSIS — J9611 Chronic respiratory failure with hypoxia: Secondary | ICD-10-CM | POA: Diagnosis not present

## 2019-06-08 DIAGNOSIS — R0602 Shortness of breath: Secondary | ICD-10-CM | POA: Diagnosis not present

## 2019-06-09 ENCOUNTER — Encounter: Payer: Self-pay | Admitting: Internal Medicine

## 2019-06-09 DIAGNOSIS — H3530 Unspecified macular degeneration: Secondary | ICD-10-CM | POA: Insufficient documentation

## 2019-06-09 DIAGNOSIS — S72142A Displaced intertrochanteric fracture of left femur, initial encounter for closed fracture: Secondary | ICD-10-CM | POA: Insufficient documentation

## 2019-06-09 DIAGNOSIS — J309 Allergic rhinitis, unspecified: Secondary | ICD-10-CM | POA: Insufficient documentation

## 2019-06-11 ENCOUNTER — Other Ambulatory Visit: Payer: Self-pay

## 2019-06-11 ENCOUNTER — Non-Acute Institutional Stay: Payer: Medicare Other | Admitting: Internal Medicine

## 2019-06-12 ENCOUNTER — Other Ambulatory Visit: Payer: Self-pay

## 2019-06-13 ENCOUNTER — Other Ambulatory Visit: Payer: Self-pay | Admitting: *Deleted

## 2019-06-13 NOTE — Patient Outreach (Signed)
Screened for potential [facility] Care Management needs as a benefit of  [employer].  Member is currently receiving skilled therapy at [facility].  Earlier today, Probation officer attended telephonic interdisciplinary team meeting to assess for disposition needs and transition plan for resident.   Facility reports transition plan is for [name] to return home with daughter with resumption of hospice services.   Will continue to follow while member remains in SNF.    Heather Rolling, MSN-Ed, Heather Barnes,Heather Barnes [place] Acute Care Coordinator [phone] [facility]) [phone]  (Toll free office)

## 2019-06-18 DIAGNOSIS — S72142D Displaced intertrochanteric fracture of left femur, subsequent encounter for closed fracture with routine healing: Secondary | ICD-10-CM | POA: Diagnosis not present

## 2019-06-21 ENCOUNTER — Encounter: Payer: Self-pay | Admitting: Internal Medicine

## 2019-06-21 ENCOUNTER — Non-Acute Institutional Stay (SKILLED_NURSING_FACILITY): Payer: Medicare Other | Admitting: Internal Medicine

## 2019-06-21 DIAGNOSIS — J301 Allergic rhinitis due to pollen: Secondary | ICD-10-CM

## 2019-06-21 DIAGNOSIS — S72002A Fracture of unspecified part of neck of left femur, initial encounter for closed fracture: Secondary | ICD-10-CM | POA: Diagnosis not present

## 2019-06-21 DIAGNOSIS — J9611 Chronic respiratory failure with hypoxia: Secondary | ICD-10-CM | POA: Diagnosis not present

## 2019-06-21 DIAGNOSIS — H3530 Unspecified macular degeneration: Secondary | ICD-10-CM | POA: Diagnosis not present

## 2019-06-21 NOTE — Progress Notes (Deleted)
Location:    [facility] Room Number: 103/P Place of Service:  SNF [id number]) Provider:  Donell Beers, MD  Patient Care Team: Orpah Melter, MD as PCP - General (Internal Medicine) Brand Males, MD as Consulting Physician (Pulmonary Disease) Rolm Bookbinder, MD as Consulting Physician (General Surgery) Magrinat, Virgie Dad, MD as Consulting Physician (Oncology) Gery Pray, MD as Consulting Physician (Radiation Oncology) Gerda Diss, DO as Consulting Physician [facility] Medicine)  Extended Emergency Contact Information Primary Emergency Contact: Shona Needles, [place] [address] Johnnette Litter of [place] Phone: [phone] Mobile Phone: [phone] Relation: Son Secondary Emergency Contact: Karime, [place], [place] [address] Johnnette Litter of [place] Phone: [phone] Relation: Daughter  Code Status:  DNR Goals of care: Advanced Directive information Advanced Directives 06/21/2019  Does Patient Have a Medical Advance Directive? Yes  Type of Advance Directive Living will;Out of facility DNR (pink MOST or yellow form)  Does patient want to make changes to medical advance directive? No - Patient declined  Copy of [place] in Chart? -  Would patient like information on creating a medical advance directive? -  Pre-existing out of facility DNR order (yellow form or pink MOST form) Yellow form placed in chart (order not valid for inpatient use)     Chief Complaint  Patient presents with  . Follow-up    Follow Up Visit    HPI:  Pt is a 86 y.o. female seen today for an acute visit for    Past Medical History:  Diagnosis Date  . Arthritis    "joints" (04/11/2017)  . Asthma   . Breast cancer, right breast ([place])    S/P RIGHT BREAST LUMPECTOMY WITH RADIOACTIVE SEED LOCALIZATION 04/11/2017  . Chronic lower back pain    "if I stand too long" (04/11/2017)  . Colon adenomas     . Complication of anesthesia    Must sit upright 45-60 degree angle per pulmonology  . COPD (chronic obstructive pulmonary disease) ([place])    "severe" (04/11/2017)  . Coronary artery calcification 09/01/2016  . Dysrhythmia    bigeminy  . GERD (gastroesophageal reflux disease)   . Goiter   . History of duodenal ulcer   . History of radiation therapy 05/24/11-06/02/11   R middle lobe lung/ 60 gray  . Hx of colonic polyps   . Hyperlipidemia   . Lung cancer, middle lobe ([place]) 04/26/2011   S/P radiation 05/24/11-06/02/11  . On home oxygen therapy    "3L; all the time" (04/11/2017)  . Osteoporosis   . Osteoporosis   . Palpitations 01/19/2016   a. event monitor in 01/2016 showed PVC's with one episode of ventricular quadrigeminy.  . Pneumonia 2017 X 2  . Pollen allergy   . PVC (premature ventricular contraction) 02/24/2016  . Shortness of breath   . Wears glasses    Past Surgical History:  Procedure Laterality Date  . APPENDECTOMY    . BIOPSY THYROID  2016  . BREAST BIOPSY Right 03/2017  . BREAST LUMPECTOMY WITH RADIOACTIVE SEED LOCALIZATION Right 04/11/2017  . BREAST LUMPECTOMY WITH RADIOACTIVE SEED LOCALIZATION Right 04/11/2017   Procedure: RIGHT BREAST LUMPECTOMY WITH RADIOACTIVE SEED LOCALIZATION;  Surgeon: Rolm Bookbinder, MD;  Location: [facility];  Service: General;  Laterality: Right;  . CATARACT EXTRACTION W/ INTRAOCULAR LENS  IMPLANT, BILATERAL Bilateral   . COLONOSCOPY W/ BIOPSIES    .  DILATION AND CURETTAGE OF UTERUS  "many"  . INTRAMEDULLARY (IM) NAIL INTERTROCHANTERIC Left 05/29/2019   Procedure: INTRAMEDULLARY (IM) NAIL INTERTROCHANTRIC;  Surgeon: Renette Butters, MD;  Location: [facility];  Service: Orthopedics;  Laterality: Left;  . TONSILLECTOMY    . TOTAL ABDOMINAL HYSTERECTOMY      Allergies  Allergen Reactions  . Tiotropium Bromide Monohydrate Hives, Shortness Of Breath and Rash    Spiriva   . Codeine Other (See Comments)    Hyperactivity,crying  . Fosamax  [Alendronate] Other (See Comments)    Muscle aches  . Asa [Aspirin] Other (See Comments)    Bleeding -ulcers  . Latex Rash    Outpatient Encounter Medications as of 06/21/2019  Medication Sig  . bisacodyl (DULCOLAX) 10 MG suppository Place 10 mg rectally daily as needed for moderate constipation (If not relieved by MOM).  . cetirizine (ZYRTEC) 10 MG tablet Take 10 mg by mouth daily.    [name] enoxaparin (LOVENOX) 40 MG/0.4ML injection Inject 0.4 mLs (40 mg total) into the skin daily for 30 doses. For 30 days post op for DVT prophylaxis  . feeding supplement, ENSURE ENLIVE, (ENSURE ENLIVE) LIQD Take 237 mLs by mouth 2 (two) times daily between meals.  [name] ipratropium-albuterol (DUONEB) 0.5-2.5 (3) MG/3ML SOLN Take 3 mLs by nebulization 4 (four) times daily.  . magnesium hydroxide (MILK OF MAGNESIA) 400 MG/5ML suspension Take 30 mLs by mouth daily as needed for mild constipation.  . metoprolol tartrate (LOPRESSOR) 25 MG tablet TAKE 1/2 (ONE-HALF) TABLET BY MOUTH TWICE DAILY  . Multiple Vitamins-Minerals (PRESERVISION AREDS 2) CAPS Take 1 tablet by mouth 2 (two) times a day.   . predniSONE (DELTASONE) 20 MG tablet Take 1 tablet (20 mg total) by mouth daily with breakfast.  . Propylene Glycol (SYSTANE COMPLETE) 0.6 % SOLN Place 1 drop into both eyes 2 (two) times daily.  . sennosides-docusate sodium (SENOKOT-S) 8.6-50 MG tablet Take 1 tablet by mouth 2 (two) times daily.  . Sodium Phosphates (RA SALINE ENEMA RE) Place 1 each rectally daily as needed (Constipation).  . [DISCONTINUED] HYDROcodone-acetaminophen (NORCO) 5-325 MG tablet Take 1 tablet by mouth 2 (two) times daily.  . [DISCONTINUED] OXYGEN Inhale into the lungs. 3L at rest and 4pulse with exertion   No facility-administered encounter medications on file as of 06/21/2019.    Review of Systems  Immunization History  Administered Date(s) Administered  . Influenza Split 04/07/2013, 03/07/2014  . Influenza Whole 02/06/2011, 03/07/2012  .  Influenza, High Dose Seasonal PF 03/07/2016, 02/05/2017, 02/09/2018  . Influenza,inj,Quad PF,6+ Mos 01/27/2015  . Influenza-Unspecified 01/05/2014, 03/08/2019  . Pneumococcal Conjugate-13 10/05/2013  . Pneumococcal Polysaccharide-23 06/08/2003  . Pneumococcal-Unspecified 11/05/2012   Pertinent  Health Maintenance Due  Topic Date Due  . DEXA SCAN  08/01/2000  . INFLUENZA VACCINE  Completed  . PNA vac Low Risk Adult  Completed   No flowsheet data found. Functional Status Survey:    Vitals:   06/21/19 1426  BP: (!) 145/75  Pulse: 79  Resp: 18  Temp: (!) 97.5 F (36.4 C)  TempSrc: Oral  SpO2: 98%  Weight: 104 lb 3.2 oz (47.3 kg)  Height: 4\' 10"  (1.473 m)   Body mass index is 21.78 kg/m. Physical Exam  Labs reviewed: Recent Labs    12/05/18 0550 12/05/18 0550 05/26/19 1520 05/27/19 0734 05/28/19 0522 05/29/19 0405 05/29/19 1828 05/30/19 0428  NA 140  --  138 135  --   --   --   --   K 3.9  --  4.0 4.1  --   --   --   --   CL 98  --  101 96*  --   --   --   --   CO2 32  --  31 31  --   --   --   --   GLUCOSE 107*  --  156* 109*  --   --   --   --   BUN 11  --  12 8  --   --   --   --   CREATININE 0.31*   < > 0.30* 0.42*  --   --  0.53  --   CALCIUM 8.7*  --  8.4* 9.0  --   --   --   --   MG 2.2   < >  --   --  2.0 1.8  --  1.8  PHOS 3.4   < >  --   --  2.9 2.8  --  3.3   < > = values in this interval not displayed.   Recent Labs    11/24/18 0452 05/26/19 1520  AST 28 12*  ALT 124* 17  ALKPHOS 100 99  BILITOT 1.0 0.5  PROT 7.0 5.2*  ALBUMIN 2.9* 3.2*   Recent Labs    11/30/18 0235 11/30/18 0235 12/05/18 0550 12/05/18 0550 05/26/19 1520 05/26/19 1520 05/27/19 0734 05/28/19 0522 05/29/19 1828  WBC 23.6*   < > 16.1*   < > 11.0*   < > 12.6* 12.6* 16.6*  NEUTROABS 20.6*  --  13.4*  --  10.0*  --   --   --   --   HGB 9.7*   < > 10.0*   < > 12.0   < > 11.1* 11.0* 10.3*  HCT 32.4*   < > 34.6*   < > 39.3   < > 35.3* 33.5* 32.9*  MCV 92.3   < > 91.3    < > 89.7   < > 86.3 85.5 87.0  PLT 229   < > 250   < > 181   < > 188 204 242   < > = values in this interval not displayed.   Lab Results  Component Value Date   TSH 1.13 01/21/2016   Lab Results  Component Value Date   HGBA1C 5.6 11/25/2018   Lab Results  Component Value Date   CHOL 186 01/21/2016   HDL 86 01/21/2016   LDLCALC 81 01/21/2016   TRIG 97 01/21/2016   CHOLHDL 2.2 01/21/2016    Significant Diagnostic Results in last 30 days:  DG C-Arm 1-60 Min  Result Date: 05/29/2019 CLINICAL DATA:  Intraoperative imaging for fixation of a left intertrochanteric fracture which the patient suffered in a fall 05/26/2019. Initial encounter. EXAM: DG C-ARM 1-60 MIN; LEFT FEMUR 2 VIEWS COMPARISON:  Plain films left hip 05/26/2019. FINDINGS: Six fluoroscopic intraoperative spot views demonstrate placement of a hip screw and long intramedullary nail with a single distal screw for fixation of a comminuted left intertrochanteric fracture. Hardware is intact. Position and alignment are improved. No acute abnormality. IMPRESSION: Intraoperative imaging for fixation of a left intertrochanteric fracture. No acute finding. Electronically Signed   By: Inge Rise M.D.   On: 05/29/2019 15:54   DG Hip Unilat W or Wo Pelvis 2-3 Views Left  Result Date: 05/26/2019 CLINICAL DATA:  Witnessed fall, shortening and lateral rotation of the left leg, history of osteoporosis EXAM: DG HIP (WITH OR WITHOUT PELVIS) 2-3V  LEFT COMPARISON:  None FINDINGS: Comminuted intertrochanteric fracture of the left proximal femur with valgus deformity and superior migration of the distal fracture fragment. There is some extension of the fracture into the subtrochanteric femur. No additional fracture is identified. Lateral view is limited due to overlying material but suggest posterior displacement also of the distal fracture component. IMPRESSION: Comminuted intertrochanteric fracture of the left proximal femur with valgus  deformity and superior and posterior displacement of distal fracture elements with extension of the fracture into the subtrochanteric femur. Electronically Signed   By: Zetta Bills M.D.   On: 05/26/2019 15:29   DG FEMUR MIN 2 VIEWS LEFT  Result Date: 05/29/2019 CLINICAL DATA:  Intraoperative imaging for fixation of a left intertrochanteric fracture which the patient suffered in a fall 05/26/2019. Initial encounter. EXAM: DG C-ARM 1-60 MIN; LEFT FEMUR 2 VIEWS COMPARISON:  Plain films left hip 05/26/2019. FINDINGS: Six fluoroscopic intraoperative spot views demonstrate placement of a hip screw and long intramedullary nail with a single distal screw for fixation of a comminuted left intertrochanteric fracture. Hardware is intact. Position and alignment are improved. No acute abnormality. IMPRESSION: Intraoperative imaging for fixation of a left intertrochanteric fracture. No acute finding. Electronically Signed   By: Inge Rise M.D.   On: 05/29/2019 15:54    Assessment/Plan There are no diagnoses linked to this encounter.

## 2019-06-21 NOTE — Progress Notes (Signed)
Location:    [facility] Room Number: 103/P Place of Service:  SNF (31)  Provider: Lorne Skeens  PCP: Orpah Melter, MD Patient Care Team: Orpah Melter, MD as PCP - General (Internal Medicine) Brand Males, MD as Consulting Physician (Pulmonary Disease) Rolm Bookbinder, MD as Consulting Physician (General Surgery) Magrinat, Virgie Dad, MD as Consulting Physician (Oncology) Gery Pray, MD as Consulting Physician (Radiation Oncology) Gerda Diss, DO as Consulting Physician [facility])  Extended Emergency Contact Information Primary Emergency Contact: [place], [place] [address] Johnnette Litter of [place] Phone: [phone] Mobile Phone: [phone] Relation: Son Secondary Emergency Contact: [place], [place], [place] [address] Johnnette Litter of [place] Phone: [phone] Relation: Daughter  Code Status: DNR Goals of care:  Advanced Directive information Advanced Directives 06/21/2019  Does Patient Have a Medical Advance Directive? Yes  Type of Advance Directive Living will;Out of facility DNR (pink MOST or yellow form)  Does patient want to make changes to medical advance directive? No - Patient declined  Copy of [place] in Chart? -  Would patient like information on creating a medical advance directive? -  Pre-existing out of facility DNR order (yellow form or pink MOST form) Yellow form placed in chart (order not valid for inpatient use)     Allergies  Allergen Reactions  . Tiotropium Bromide Monohydrate Hives, Shortness Of Breath and Rash    Spiriva   . Codeine Other (See Comments)    Hyperactivity,crying  . Fosamax [Alendronate] Other (See Comments)    Muscle aches  . Asa [Aspirin] Other (See Comments)    Bleeding -ulcers  . Latex Rash    Chief Complaint  Patient presents with  . Discharge Note    Discharge Visit    HPI:  86 y.o. female seen  today for discharge from facility on Sunday, January 17. Patient was here for rehab after sustaining a left proximal femur fracture that was surgically repaired with an IM nailing.  She was here for PT and OT and has done well.  She also has a history of end-stage COPD with hypoxia she is on chronic oxygen as well as prednisone and duo nebs.  She also has a history of coronary artery disease which has been treated with Metroprolol.  Currently she has no complaints she is looking forward to going home she has been under hospice care and will be discharged home under hospice care.        Past Medical History:  Diagnosis Date  . Arthritis    "joints" (04/11/2017)  . Asthma   . Breast cancer, right breast ([place])    S/P RIGHT BREAST LUMPECTOMY WITH RADIOACTIVE SEED LOCALIZATION 04/11/2017  . Chronic lower back pain    "if I stand too long" (04/11/2017)  . Colon adenomas   . Complication of anesthesia    Must sit upright 45-60 degree angle per pulmonology  . COPD (chronic obstructive pulmonary disease) ([place])    "severe" (04/11/2017)  . Coronary artery calcification 09/01/2016  . Dysrhythmia    bigeminy  . GERD (gastroesophageal reflux disease)   . Goiter   . History of duodenal ulcer   . History of radiation therapy 05/24/11-06/02/11   R middle lobe lung/ 60 gray  . Hx of colonic polyps   . Hyperlipidemia   . Lung cancer, middle lobe ([facility]) 04/26/2011   S/P radiation 05/24/11-06/02/11  .  On home oxygen therapy    "3L; all the time" (04/11/2017)  . Osteoporosis   . Osteoporosis   . Palpitations 01/19/2016   a. event monitor in 01/2016 showed PVC's with one episode of ventricular quadrigeminy.  . Pneumonia 2017 X 2  . Pollen allergy   . PVC (premature ventricular contraction) 02/24/2016  . Shortness of breath   . Wears glasses     Past Surgical History:  Procedure Laterality Date  . APPENDECTOMY    . BIOPSY THYROID  2016  . BREAST BIOPSY Right 03/2017  . BREAST LUMPECTOMY  WITH RADIOACTIVE SEED LOCALIZATION Right 04/11/2017  . BREAST LUMPECTOMY WITH RADIOACTIVE SEED LOCALIZATION Right 04/11/2017   Procedure: RIGHT BREAST LUMPECTOMY WITH RADIOACTIVE SEED LOCALIZATION;  Surgeon: Rolm Bookbinder, MD;  Location: [facility];  Service: General;  Laterality: Right;  . CATARACT EXTRACTION W/ INTRAOCULAR LENS  IMPLANT, BILATERAL Bilateral   . COLONOSCOPY W/ BIOPSIES    . DILATION AND CURETTAGE OF UTERUS  "many"  . INTRAMEDULLARY (IM) NAIL INTERTROCHANTERIC Left 05/29/2019   Procedure: INTRAMEDULLARY (IM) NAIL INTERTROCHANTRIC;  Surgeon: Renette Butters, MD;  Location: [facility];  Service: Orthopedics;  Laterality: Left;  . TONSILLECTOMY    . TOTAL ABDOMINAL HYSTERECTOMY        reports that she quit smoking about 21 years ago. Her smoking use included cigarettes. She has a 20.00 pack-year smoking history. She has never used smokeless tobacco. She reports that she does not drink alcohol or use drugs. Social History   Socioeconomic History  . Marital status: Widowed    Spouse name: Not on file  . Number of children: Not on file  . Years of education: Not on file  . Highest education level: Not on file  Occupational History  . Occupation: retired  Tobacco Use  . Smoking status: Former Smoker    Packs/day: 0.50    Years: 40.00    Pack years: 20.00    Types: Cigarettes    Quit date: 06/07/1998    Years since quitting: 21.0  . Smokeless tobacco: Never Used  Substance and Sexual Activity  . Alcohol use: No  . Drug use: No  . Sexual activity: Not on file  Other Topics Concern  . Not on file  Social History Narrative   On 03/17/11: expressed concerns about harmful effects of XRT based on dtr's  experience with the same with breast cancer. Former smoker.   Social Determinants of Health   Financial Resource Strain:   . Difficulty of Paying Living Expenses: Not on file  Food Insecurity:   . Worried About Charity fundraiser in the Last Year: Not on file  . Ran Out of  Food in the Last Year: Not on file  Transportation Needs:   . Lack of Transportation (Medical): Not on file  . Lack of Transportation (Non-Medical): Not on file  Physical Activity:   . Days of Exercise per Week: Not on file  . Minutes of Exercise per Session: Not on file  Stress:   . Feeling of Stress : Not on file  Social Connections:   . Frequency of Communication with Friends and Family: Not on file  . Frequency of Social Gatherings with Friends and Family: Not on file  . Attends Religious Services: Not on file  . Active Member of Clubs or Organizations: Not on file  . Attends Archivist Meetings: Not on file  . Marital Status: Not on file  Intimate Partner Violence:   . Fear of  Current or Ex-Partner: Not on file  . Emotionally Abused: Not on file  . Physically Abused: Not on file  . Sexually Abused: Not on file   Functional Status Survey:    Allergies  Allergen Reactions  . Tiotropium Bromide Monohydrate Hives, Shortness Of Breath and Rash    Spiriva   . Codeine Other (See Comments)    Hyperactivity,crying  . Fosamax [Alendronate] Other (See Comments)    Muscle aches  . Asa [Aspirin] Other (See Comments)    Bleeding -ulcers  . Latex Rash    Pertinent  Health Maintenance Due  Topic Date Due  . DEXA SCAN  08/01/2000  . INFLUENZA VACCINE  Completed  . PNA vac Low Risk Adult  Completed    Medications: Systane eyedrops twice daily.  Prednisone 20 mg p.o. daily.  Lopressor 12.5 mg p.o. twice daily.  DuoNebs 4 times daily.  Zyrtec 10 mg daily.  She had been on Lovenox for DVT prophylaxis per discussion with orthopedics this will be discontinued upon her discharge from this facility  Review of Systems   General she is not complaining of any fever or chills Skin does not complain of any rashes or itching.  Head ears eyes nose mouth and throat is not complaining of visual changes or sore throat.  Respiratory is not complaining of being short of  breath or having a cough.  Cardiac does not complain of chest pain or increased edema.  GI no complaints of abdominal pain nausea vomiting diarrhea constipation.  GU is not complaining of dysuria.  Musculoskeletal is not really complaining of pain at this time.  Neurologic does not complain of dizziness headache or syncopal type feelings.  Psych does not complain of being depressed or anxious appears to be in good spirits.    Vitals:   06/21/19 1426  BP: (!) 145/75  Pulse: 79  Resp: 18  Temp: (!) 97.5 F (36.4 C)  TempSrc: Oral  SpO2: 98%  Weight: 104 lb 3.2 oz (47.3 kg)  Height: 4\' 10"  (1.473 m)   Body mass index is 21.78 kg/m. Physical Exam General this is a pleasant elderly female in no distress.  Her skin is warm and dry.  Eyes visual acuity appears to be intact sclera and conjunctive are clear.  Oropharynx is clear mucous membranes moist.  Chest is clear to auscultation could not really appreciate congestion she does have decreased breath sounds.  Heart is regular rate and rhythm with somewhat distant heart sounds she does not have significant lower extremity edema pedal pulses are intact.  Abdomen is soft nontender with positive bowel sounds.  Musculoskeletal is able to move all 4 extremities at baseline she is able to ambulate with a walker at this point.  Neurologic appears grossly intact her speech is clear could not appreciate lateralizing findings.  Psych she is largely alert and oriented pleasant and appropriate.   Labs reviewed: Basic Metabolic Panel: Recent Labs    12/05/18 0550 12/05/18 0550 05/26/19 1520 05/27/19 0734 05/28/19 0522 05/29/19 0405 05/29/19 1828 05/30/19 0428  NA 140  --  138 135  --   --   --   --   K 3.9  --  4.0 4.1  --   --   --   --   CL 98  --  101 96*  --   --   --   --   CO2 32  --  31 31  --   --   --   --  GLUCOSE 107*  --  156* 109*  --   --   --   --   BUN 11  --  12 8  --   --   --   --   CREATININE  0.31*   < > 0.30* 0.42*  --   --  0.53  --   CALCIUM 8.7*  --  8.4* 9.0  --   --   --   --   MG 2.2   < >  --   --  2.0 1.8  --  1.8  PHOS 3.4   < >  --   --  2.9 2.8  --  3.3   < > = values in this interval not displayed.   Liver Function Tests: Recent Labs    11/24/18 0452 05/26/19 1520  AST 28 12*  ALT 124* 17  ALKPHOS 100 99  BILITOT 1.0 0.5  PROT 7.0 5.2*  ALBUMIN 2.9* 3.2*   No results for input(s): LIPASE, AMYLASE in the last 8760 hours. No results for input(s): AMMONIA in the last 8760 hours. CBC: Recent Labs    11/30/18 0235 11/30/18 0235 12/05/18 0550 12/05/18 0550 05/26/19 1520 05/26/19 1520 05/27/19 0734 05/28/19 0522 05/29/19 1828  WBC 23.6*   < > 16.1*   < > 11.0*   < > 12.6* 12.6* 16.6*  NEUTROABS 20.6*  --  13.4*  --  10.0*  --   --   --   --   HGB 9.7*   < > 10.0*   < > 12.0   < > 11.1* 11.0* 10.3*  HCT 32.4*   < > 34.6*   < > 39.3   < > 35.3* 33.5* 32.9*  MCV 92.3   < > 91.3   < > 89.7   < > 86.3 85.5 87.0  PLT 229   < > 250   < > 181   < > 188 204 242   < > = values in this interval not displayed.   Cardiac Enzymes: Recent Labs    11/24/18 0452  TROPONINI 0.05*   BNP: Invalid input(s): POCBNP CBG: Recent Labs    12/02/18 0432 12/02/18 0806 12/02/18 1154  GLUCAP 109* 104* 135*    Procedures and Imaging Studies During Stay: DG C-Arm 1-60 Min  Result Date: 05/29/2019 CLINICAL DATA:  Intraoperative imaging for fixation of a left intertrochanteric fracture which the patient suffered in a fall 05/26/2019. Initial encounter. EXAM: DG C-ARM 1-60 MIN; LEFT FEMUR 2 VIEWS COMPARISON:  Plain films left hip 05/26/2019. FINDINGS: Six fluoroscopic intraoperative spot views demonstrate placement of a hip screw and long intramedullary nail with a single distal screw for fixation of a comminuted left intertrochanteric fracture. Hardware is intact. Position and alignment are improved. No acute abnormality. IMPRESSION: Intraoperative imaging for fixation of  a left intertrochanteric fracture. No acute finding. Electronically Signed   By: Inge Rise M.D.   On: 05/29/2019 15:54   DG Hip Unilat W or Wo Pelvis 2-3 Views Left  Result Date: 05/26/2019 CLINICAL DATA:  Witnessed fall, shortening and lateral rotation of the left leg, history of osteoporosis EXAM: DG HIP (WITH OR WITHOUT PELVIS) 2-3V LEFT COMPARISON:  None FINDINGS: Comminuted intertrochanteric fracture of the left proximal femur with valgus deformity and superior migration of the distal fracture fragment. There is some extension of the fracture into the subtrochanteric femur. No additional fracture is identified. Lateral view is limited due to overlying material but suggest posterior displacement also  of the distal fracture component. IMPRESSION: Comminuted intertrochanteric fracture of the left proximal femur with valgus deformity and superior and posterior displacement of distal fracture elements with extension of the fracture into the subtrochanteric femur. Electronically Signed   By: Zetta Bills M.D.   On: 05/26/2019 15:29   DG FEMUR MIN 2 VIEWS LEFT  Result Date: 05/29/2019 CLINICAL DATA:  Intraoperative imaging for fixation of a left intertrochanteric fracture which the patient suffered in a fall 05/26/2019. Initial encounter. EXAM: DG C-ARM 1-60 MIN; LEFT FEMUR 2 VIEWS COMPARISON:  Plain films left hip 05/26/2019. FINDINGS: Six fluoroscopic intraoperative spot views demonstrate placement of a hip screw and long intramedullary nail with a single distal screw for fixation of a comminuted left intertrochanteric fracture. Hardware is intact. Position and alignment are improved. No acute abnormality. IMPRESSION: Intraoperative imaging for fixation of a left intertrochanteric fracture. No acute finding. Electronically Signed   By: Inge Rise M.D.   On: 05/29/2019 15:54    Assessment/Plan:    #1 history of left proximal femur fracture after a fall-she is status post IM nailing-she  appears to be doing well with this.  She is on Lovenox for DVT prophylaxis this will be discontinued upon her discharge from facility-this has been discussed with orthopedics.  She will be going home under hospice care.  2.  History of chronic respiratory failure with hypoxia-she continues on prednisone 20 mg a day as well as duo nebs 4 times a day-she will be followed by hospice secondary to end-stage COPD she appears to be stable in this respect.  3.  History of coronary artery disease this is been largely asymptomatic during her stay here she is on Lopressor 12.5 mg twice daily.  4.  History of allergic rhinitis she continues on Zyrtec 10 mg a day which appears to be stable.  5.  History of macular degeneration she continues on topical eyedrops.  [id number] note greater than 30 minutes spent preparing this discharge summary-greater than 50% of time spent coordinating a plan of care

## 2019-06-23 MED ORDER — IPRATROPIUM-ALBUTEROL 0.5-2.5 (3) MG/3ML IN SOLN: 3.0000 mL | Freq: Four times a day (QID) | RESPIRATORY_TRACT | 0 refills | Status: AC

## 2019-06-23 MED ORDER — METOPROLOL TARTRATE 25 MG PO TABS: ORAL_TABLET | ORAL | 0 refills | Status: AC

## 2019-06-23 MED ORDER — CETIRIZINE HCL 10 MG PO TABS: 10.0000 mg | ORAL_TABLET | Freq: Every day | ORAL | 0 refills | Status: AC

## 2019-06-23 MED ORDER — SYSTANE COMPLETE 0.6 % OP SOLN: 1.0000 [drp] | Freq: Two times a day (BID) | OPHTHALMIC | 0 refills | Status: AC

## 2019-06-23 MED ORDER — PREDNISONE 20 MG PO TABS: 20.0000 mg | ORAL_TABLET | Freq: Every day | ORAL | 0 refills | Status: AC

## 2019-06-26 ENCOUNTER — Other Ambulatory Visit: Payer: Self-pay | Admitting: *Deleted

## 2019-06-26 NOTE — Patient Outreach (Signed)
Member screened for potential [facility] Care Management needs as a benefit of [place] [employer].  Verified on Patient Heather Barnes that Heather Barnes transitioned from [employer] on 06/23/19. Facility previously stated member will return home with hospice with resumption of hospice services.   No identifiable [facility] Care Management needs at this time.    Marthenia Rolling, MSN-Ed, RN,BSN [facility] Acute Care Coordinator [phone] [facility]) [phone]  (Toll free office)

## 2019-07-16 ENCOUNTER — Other Ambulatory Visit: Payer: Self-pay | Admitting: Internal Medicine

## 2019-08-22 NOTE — Progress Notes (Signed)
Virtual Visit via Telephone Note   This visit type was conducted due to national recommendations for restrictions regarding the COVID-19 Pandemic (e.g. social distancing) in an effort to limit this patient's exposure and mitigate transmission in our community.  Due to her co-morbid illnesses, this patient is at least at moderate risk for complications without adequate follow up.  This format is felt to be most appropriate for this patient at this time.  The patient did not have access to video technology/had technical difficulties with video requiring transitioning to audio format only (telephone).  All issues noted in this document were discussed and addressed.  No physical exam could be performed with this format.  Please refer to the patient's chart for her  consent to telehealth for [facility].  Evaluation Performed:  Follow-up visit  This visit type was conducted due to national recommendations for restrictions regarding the COVID-19 Pandemic (e.g. social distancing).  This format is felt to be most appropriate for this patient at this time.  All issues noted in this document were discussed and addressed.  No physical exam was performed (except for noted visual exam findings with Video Visits).  Please refer to the patient's chart (MyChart message for video visits and phone note for telephone visits) for the patient's consent to telehealth for [facility]  Date:  08/23/2019   ID:  Heather Barnes, DOB Jan 24, 1934, MRN [id number]  Patient Location:  [place] [address]   Provider location:      [address] Office [phone] Fax [phone]   PCP:  Orpah Melter, MD  Cardiologist:  Skeet Latch, MD  Electrophysiologist:  None   Chief Complaint: Follow-up  History of Present Illness:    Heather Barnes is a 86 y.o. female who presents via audio/video conferencing for a telehealth  visit today.  Patient verified DOB and address.  The patient does not symptoms concerning for COVID-19 infection (fever, chills, cough, or new SHORTNESS OF BREATH).   Heather Barnes has past medical history of PVCs, asymptomatic coronary artery calcification, COPD, lung CA in remission, acute on chronic respiratory failure, and tachycardia.  She was initially seen and evaluated 8/16 for coronary calcification that was noted in her LM, LAD, and RCA on a cardiac CT.  Heather Barnes had reported exertional dyspnea that had been getting progressively worse for several years.  This was believed to be related to her COPD and lung cancer.  At that time she declined stress testing.  She cannot tolerate aspirin therapy due to bleeding ulcers.  She was  started on atorvastatin but developed myalgias.  She was hospitalized for pneumonia in January 17 and again in October 17.  She was noted to have prolonged shortness of breath since these hospital admissions.  She was referred for a 7-day event monitor 8/17 that showed occasional PVCs with an episode of ventricular quadrigeminy.  She was also noted to have oxygen desaturation with minimal exertion while wearing the monitor.  She developed a compression fracture and continues to have back pain.  She was diagnosed with breast CA on a routine mammogram 9/18.  Her biopsy 10/18 revealed ductal carcinoma in situ, high-grade, weakly ER positive, PR negative.  She was admitted for a lumpectomy 11/18.  She spent the night in the hospital due to her severe COPD.  She then followed up with Heather Barnes.  She would typically recommend radiation given the close margins.  However  due to her underlying lung disease radiation was not pursued.  She has been noted to have progressive exertional dyspnea and has been switched from oxygen concentrator to regular oxygen.  This allows her to be away from home for longer periods of time.  She also feels that this is more effective delivery of oxygen.   She has been using 3 L of oxygen at rest and 4 L with activity.  During her last visit with Heather Barnes 10/16/2018 she was felt to be stable with her breathing however, she did struggle with seasonal allergies.  She denied exertional chest pain, lower extremity edema, orthopnea or PND.  She continued to do well on rosuvastatin twice per week and was following a strict diet.  She stated that she very rarely ate fried food.  She presents today for follow-up virtually with her daughter Heather Barnes and states she had recently sustained a hip fracture after a fall in December 2020.  She then went to [facility] for rehab for 1 month.  She is now back at home and doing physical therapy/home activities including ambulation daily.  She is also receiving visits from a Heather Barnes who is affiliated with hospice.  She has a home health nurse that comes and visits weekly.  I will send her a blood pressure log, have her increase her physical activity as directed, and have her follow-up in 6 months.  Today she denies chest pain, shortness of breath, lower extremity edema, fatigue, palpitations, melena, hematuria, hemoptysis, diaphoresis, weakness, presyncope, syncope, orthopnea, and PND.    Prior CV studies:   The following studies were reviewed today:  EKG 05/29/2019 Sinus tachycardia with occasional PVCs nonspecific ST abnormality 112 bpm  Cardiac event monitor 01/21/2016 7 Day Event Monitor  Quality: Fair.  Baseline artifact. Predominant rhythm: sinus  Average heart rate: 77 bpm Max heart rate: 110 bpm Min heart rate: 64 bpm  Occasional PVCs, with an episode of ventricular quadrigeminy.   Chest CT 12/20/14:Atherosclerosis of the thoracic aorta, mediastinal great vessels, LM, LAD and RCA.  Past Medical History:  Diagnosis Date  . Arthritis    "joints" (04/11/2017)  . Asthma   . Breast cancer, right breast ([place])    S/P RIGHT BREAST LUMPECTOMY WITH RADIOACTIVE SEED LOCALIZATION 04/11/2017  .  Chronic lower back pain    "if I stand too long" (04/11/2017)  . Colon adenomas   . Complication of anesthesia    Must sit upright 45-60 degree angle per pulmonology  . COPD (chronic obstructive pulmonary disease) ([place])    "severe" (04/11/2017)  . Coronary artery calcification 09/01/2016  . Dysrhythmia    bigeminy  . GERD (gastroesophageal reflux disease)   . Goiter   . History of duodenal ulcer   . History of radiation therapy 05/24/11-06/02/11   R middle lobe lung/ 60 gray  . Hx of colonic polyps   . Hyperlipidemia   . Lung cancer, middle lobe ([place]) 04/26/2011   S/P radiation 05/24/11-06/02/11  . On home oxygen therapy    "3L; all the time" (04/11/2017)  . Osteoporosis   . Osteoporosis   . Palpitations 01/19/2016   a. event monitor in 01/2016 showed PVC's with one episode of ventricular quadrigeminy.  . Pneumonia 2017 X 2  . Pollen allergy   . PVC (premature ventricular contraction) 02/24/2016  . Shortness of breath   . Wears glasses    Past Surgical History:  Procedure Laterality Date  . APPENDECTOMY    . BIOPSY THYROID  2016  .  BREAST BIOPSY Right 03/2017  . BREAST LUMPECTOMY WITH RADIOACTIVE SEED LOCALIZATION Right 04/11/2017  . BREAST LUMPECTOMY WITH RADIOACTIVE SEED LOCALIZATION Right 04/11/2017   Procedure: RIGHT BREAST LUMPECTOMY WITH RADIOACTIVE SEED LOCALIZATION;  Surgeon: Rolm Bookbinder, MD;  Location: [facility];  Service: General;  Laterality: Right;  . CATARACT EXTRACTION W/ INTRAOCULAR LENS  IMPLANT, BILATERAL Bilateral   . COLONOSCOPY W/ BIOPSIES    . DILATION AND CURETTAGE OF UTERUS  "many"  . INTRAMEDULLARY (IM) NAIL INTERTROCHANTERIC Left 05/29/2019   Procedure: INTRAMEDULLARY (IM) NAIL INTERTROCHANTRIC;  Surgeon: Renette Butters, MD;  Location: [place];  Service: Orthopedics;  Laterality: Left;  . TONSILLECTOMY    . TOTAL ABDOMINAL HYSTERECTOMY       Current Meds  Medication Sig  . bisacodyl (DULCOLAX) 10 MG suppository Place 10 mg rectally daily as  needed for moderate constipation (If not relieved by MOM).  . cetirizine (ZYRTEC) 10 MG tablet Take 1 tablet (10 mg total) by mouth daily.  . feeding supplement, ENSURE ENLIVE, (ENSURE ENLIVE) LIQD Take 237 mLs by mouth 2 (two) times daily between meals.  [name] ipratropium-albuterol (DUONEB) 0.5-2.5 (3) MG/3ML SOLN Take 3 mLs by nebulization 4 (four) times daily.  [name] LORazepam (ATIVAN) 1 MG tablet Take 1 mg by mouth at bedtime.  . magnesium hydroxide (MILK OF MAGNESIA) 400 MG/5ML suspension Take 30 mLs by mouth daily as needed for mild constipation.  . metoprolol tartrate (LOPRESSOR) 25 MG tablet TAKE 1/2 (ONE-HALF) TABLET BY MOUTH TWICE DAILY  . Multiple Vitamins-Minerals (PRESERVISION AREDS 2) CAPS Take 1 tablet by mouth 2 (two) times a day.   . predniSONE (DELTASONE) 20 MG tablet Take 1 tablet (20 mg total) by mouth daily with breakfast.  . Propylene Glycol (SYSTANE COMPLETE) 0.6 % SOLN Place 1 drop into both eyes 2 (two) times daily.  . sennosides-docusate sodium (SENOKOT-S) 8.6-50 MG tablet Take 1 tablet by mouth 2 (two) times daily.  . Sodium Phosphates (RA SALINE ENEMA RE) Place 1 each rectally daily as needed (Constipation).     Allergies:   Tiotropium bromide monohydrate, Codeine, Fosamax [alendronate], Asa [aspirin], and Latex   Social History   Tobacco Use  . Smoking status: Former Smoker    Packs/day: 0.50    Years: 40.00    Pack years: 20.00    Types: Cigarettes    Quit date: 06/07/1998    Years since quitting: 21.2  . Smokeless tobacco: Never Used  Substance Use Topics  . Alcohol use: No  . Drug use: No     Family Hx: The patient's family history includes Brain cancer in her sister; Breast cancer in her daughter; Cancer in her sister, sister, and sister; Emphysema in her maternal uncle; Heart disease in her maternal grandfather and mother; Ovarian cancer in her sister; Rheum arthritis in her sister.  ROS:   Please see the history of present illness.     All other systems  reviewed and are negative.   Labs/Other Tests and Data Reviewed:    Recent Labs: 11/24/2018: B Natriuretic Peptide 119.0 05/26/2019: ALT 17 05/27/2019: BUN 8; Potassium 4.1; Sodium 135 05/29/2019: Creatinine, Ser 0.53; Hemoglobin 10.3; Platelets 242 05/30/2019: Magnesium 1.8   Recent Lipid Panel Lab Results  Component Value Date/Time   CHOL 186 01/21/2016 11:26 AM   TRIG 97 01/21/2016 11:26 AM   HDL 86 01/21/2016 11:26 AM   CHOLHDL 2.2 01/21/2016 11:26 AM   LDLCALC 81 01/21/2016 11:26 AM    Wt Readings from Last 3 Encounters:  06/21/19 104  lb 3.2 oz (47.3 kg)  06/05/19 104 lb 4.5 oz (47.3 kg)  06/05/19 104 lb 4.5 oz (47.3 kg)     Exam:    Vital Signs:  There were no vitals taken for this visit.   Well nourished, well developed female in no  acute distress.   ASSESSMENT & PLAN:    1.  Hip fracture-fell in December and sustained hip fracture.  Received physical therapy for 1 month at [facility] and is now followed by hospice. Continue to increase physical activity and do physical therapy as tolerated  Severe COPD/chronic hypoxic respiratory failure-now using  4 L of oxygen at continuously.  Followed by pulmonary  Asymptomatic coronary calcification/hyperlipidemia-LDL 81 01/21/2016.  No chest pain today.  Has had no episodes of chest pressure or discomfort. Continue metoprolol 12.5 mg daily Heart healthy low-sodium high-fiber diet Increase physical activity as tolerated  Essential hypertension-BP today unable to obtain.  Having home health nurse present to her house weekly for blood pressure checks time evaluation. Continue metoprolol tartrate 12.5 mg twice daily Heart healthy low-sodium diet-salty 6 given Increase physical activity as tolerated Blood pressure log  Palpitations -no recent episodes of palpitations.  Occasional PVCs noted on prior cardiac monitor and EKGs.  Symptoms have improved with metoprolol Continue metoprolol tartrate 12.5 mg twice daily Avoid  triggers caffeine, chocolate, EtOH, etc. Maintain p.o. hydration  Disposition: Follow-up with Heather Barnes in 6 months.  COVID-19 Education: The signs and symptoms of COVID-19 were discussed with the patient and how to seek care for testing (follow up with PCP or arrange E-visit).  The importance of social distancing was discussed today.  Patient Risk:   After full review of this patients clinical status, I feel that they are at least moderate risk at this time.  Time:   Today, I have spent 15 minutes with the patient with telehealth technology discussing diet ,exercise, medications, hip fracture, blood pressure log.     Medication Adjustments/Labs and Tests Ordered: Current medicines are reviewed at length with the patient today.  Concerns regarding medicines are outlined above.   Tests Ordered: No orders of the defined types were placed in this encounter.  Medication Changes: No orders of the defined types were placed in this encounter.   Disposition:  in 6 month(s)  Signed,    Jossie Ng. [facility] [place] [address] Office [phone] Fax [phone]

## 2019-08-23 ENCOUNTER — Telehealth (INDEPENDENT_AMBULATORY_CARE_PROVIDER_SITE_OTHER): Admitting: General Practice

## 2019-08-23 DIAGNOSIS — J449 Chronic obstructive pulmonary disease, unspecified: Secondary | ICD-10-CM

## 2019-08-23 DIAGNOSIS — I2510 Atherosclerotic heart disease of native coronary artery without angina pectoris: Secondary | ICD-10-CM

## 2019-08-23 DIAGNOSIS — R002 Palpitations: Secondary | ICD-10-CM

## 2019-08-23 DIAGNOSIS — I2584 Coronary atherosclerosis due to calcified coronary lesion: Secondary | ICD-10-CM

## 2019-08-23 DIAGNOSIS — I10 Essential (primary) hypertension: Secondary | ICD-10-CM

## 2019-08-23 DIAGNOSIS — S72002D Fracture of unspecified part of neck of left femur, subsequent encounter for closed fracture with routine healing: Secondary | ICD-10-CM

## 2019-08-23 NOTE — Patient Instructions (Signed)
Medication Instructions:  The current medical regimen is effective;  continue present plan and medications as directed. Please refer to the Current Medication list given to you today. If you need a refill on your cardiac medications before your next appointment, please call your pharmacy.  Special Instructions: TAKE AND LOG YOUR BLOOD PRESSURE DAILY-1 HR AFTER TAKING YOUR MEDICATION (MAY BE WITH HOME HEALTH NURSE)  INCREASE PHYSICAL ACTIVITY AS TOLERATED  PLEASE READ AND FOLLOW SALTY 6 ATTACHED  Follow-Up: 6 months Please call our office 2 months in advance, [place] to schedule this SEPT 2021 appointment. Virtual Visit  You may see Skeet Latch, MD or one of the following Advanced Practice Providers on your designated Care Team: Coletta Memos, [place], PA-C  [place], [place].    At [facility], you and your health needs are our priority.  As part of our continuing mission to provide you with exceptional heart care, we have created designated Provider Care Teams.  These Care Teams include your primary Cardiologist (physician) and Advanced Practice Providers (APPs -  Physician Assistants and Nurse Practitioners) who all work together to provide you with the care you need, when you need it.  Reduce your risk of getting COVID-19 With your heart disease it is especially important for people at increased risk of severe illness from COVID-19, and those who live with them, to protect themselves from getting COVID-19. The best way to protect yourself and to help reduce the spread of the virus that causes COVID-19 is to: [name] Limit your interactions with other people as much as possible. . Take COVID-19 when you do interact with others. If you start feeling sick and think you may have COVID-19, get in touch with your healthcare provider within 24 hours.  Thank you for choosing [facility] at [facility]!!

## 2019-09-06 DIAGNOSIS — J302 Other seasonal allergic rhinitis: Secondary | ICD-10-CM | POA: Diagnosis not present

## 2019-09-06 DIAGNOSIS — J9611 Chronic respiratory failure with hypoxia: Secondary | ICD-10-CM | POA: Diagnosis not present

## 2019-09-06 DIAGNOSIS — K219 Gastro-esophageal reflux disease without esophagitis: Secondary | ICD-10-CM | POA: Diagnosis not present

## 2019-09-06 DIAGNOSIS — Z853 Personal history of malignant neoplasm of breast: Secondary | ICD-10-CM | POA: Diagnosis not present

## 2019-09-06 DIAGNOSIS — Z9981 Dependence on supplemental oxygen: Secondary | ICD-10-CM | POA: Diagnosis not present

## 2019-09-06 DIAGNOSIS — S72002D Fracture of unspecified part of neck of left femur, subsequent encounter for closed fracture with routine healing: Secondary | ICD-10-CM | POA: Diagnosis not present

## 2019-09-06 DIAGNOSIS — Z7952 Long term (current) use of systemic steroids: Secondary | ICD-10-CM | POA: Diagnosis not present

## 2019-09-06 DIAGNOSIS — Z6821 Body mass index (BMI) 21.0-21.9, adult: Secondary | ICD-10-CM | POA: Diagnosis not present

## 2019-09-06 DIAGNOSIS — Z87891 Personal history of nicotine dependence: Secondary | ICD-10-CM | POA: Diagnosis not present

## 2019-09-06 DIAGNOSIS — H3530 Unspecified macular degeneration: Secondary | ICD-10-CM | POA: Diagnosis not present

## 2019-09-06 DIAGNOSIS — J440 Chronic obstructive pulmonary disease with acute lower respiratory infection: Secondary | ICD-10-CM | POA: Diagnosis not present

## 2019-09-06 DIAGNOSIS — J189 Pneumonia, unspecified organism: Secondary | ICD-10-CM | POA: Diagnosis not present

## 2019-09-06 DIAGNOSIS — Z85118 Personal history of other malignant neoplasm of bronchus and lung: Secondary | ICD-10-CM | POA: Diagnosis not present

## 2019-09-06 DIAGNOSIS — Z741 Need for assistance with personal care: Secondary | ICD-10-CM | POA: Diagnosis not present

## 2019-09-07 DIAGNOSIS — Z85118 Personal history of other malignant neoplasm of bronchus and lung: Secondary | ICD-10-CM | POA: Diagnosis not present

## 2019-09-07 DIAGNOSIS — Z741 Need for assistance with personal care: Secondary | ICD-10-CM | POA: Diagnosis not present

## 2019-09-07 DIAGNOSIS — J9611 Chronic respiratory failure with hypoxia: Secondary | ICD-10-CM | POA: Diagnosis not present

## 2019-09-07 DIAGNOSIS — J440 Chronic obstructive pulmonary disease with acute lower respiratory infection: Secondary | ICD-10-CM | POA: Diagnosis not present

## 2019-09-07 DIAGNOSIS — S72002D Fracture of unspecified part of neck of left femur, subsequent encounter for closed fracture with routine healing: Secondary | ICD-10-CM | POA: Diagnosis not present

## 2019-09-07 DIAGNOSIS — J189 Pneumonia, unspecified organism: Secondary | ICD-10-CM | POA: Diagnosis not present

## 2019-09-10 DIAGNOSIS — J189 Pneumonia, unspecified organism: Secondary | ICD-10-CM | POA: Diagnosis not present

## 2019-09-10 DIAGNOSIS — J9611 Chronic respiratory failure with hypoxia: Secondary | ICD-10-CM | POA: Diagnosis not present

## 2019-09-10 DIAGNOSIS — Z741 Need for assistance with personal care: Secondary | ICD-10-CM | POA: Diagnosis not present

## 2019-09-10 DIAGNOSIS — S72002D Fracture of unspecified part of neck of left femur, subsequent encounter for closed fracture with routine healing: Secondary | ICD-10-CM | POA: Diagnosis not present

## 2019-09-10 DIAGNOSIS — J440 Chronic obstructive pulmonary disease with acute lower respiratory infection: Secondary | ICD-10-CM | POA: Diagnosis not present

## 2019-09-10 DIAGNOSIS — Z85118 Personal history of other malignant neoplasm of bronchus and lung: Secondary | ICD-10-CM | POA: Diagnosis not present

## 2019-09-12 DIAGNOSIS — S72002D Fracture of unspecified part of neck of left femur, subsequent encounter for closed fracture with routine healing: Secondary | ICD-10-CM | POA: Diagnosis not present

## 2019-09-12 DIAGNOSIS — J440 Chronic obstructive pulmonary disease with acute lower respiratory infection: Secondary | ICD-10-CM | POA: Diagnosis not present

## 2019-09-12 DIAGNOSIS — J9611 Chronic respiratory failure with hypoxia: Secondary | ICD-10-CM | POA: Diagnosis not present

## 2019-09-12 DIAGNOSIS — Z741 Need for assistance with personal care: Secondary | ICD-10-CM | POA: Diagnosis not present

## 2019-09-12 DIAGNOSIS — Z85118 Personal history of other malignant neoplasm of bronchus and lung: Secondary | ICD-10-CM | POA: Diagnosis not present

## 2019-09-12 DIAGNOSIS — J189 Pneumonia, unspecified organism: Secondary | ICD-10-CM | POA: Diagnosis not present

## 2019-09-13 DIAGNOSIS — J9611 Chronic respiratory failure with hypoxia: Secondary | ICD-10-CM | POA: Diagnosis not present

## 2019-09-13 DIAGNOSIS — J440 Chronic obstructive pulmonary disease with acute lower respiratory infection: Secondary | ICD-10-CM | POA: Diagnosis not present

## 2019-09-13 DIAGNOSIS — S72002D Fracture of unspecified part of neck of left femur, subsequent encounter for closed fracture with routine healing: Secondary | ICD-10-CM | POA: Diagnosis not present

## 2019-09-13 DIAGNOSIS — J189 Pneumonia, unspecified organism: Secondary | ICD-10-CM | POA: Diagnosis not present

## 2019-09-13 DIAGNOSIS — Z741 Need for assistance with personal care: Secondary | ICD-10-CM | POA: Diagnosis not present

## 2019-09-13 DIAGNOSIS — Z85118 Personal history of other malignant neoplasm of bronchus and lung: Secondary | ICD-10-CM | POA: Diagnosis not present

## 2019-09-14 ENCOUNTER — Other Ambulatory Visit: Payer: Self-pay | Admitting: Internal Medicine

## 2019-09-14 DIAGNOSIS — J440 Chronic obstructive pulmonary disease with acute lower respiratory infection: Secondary | ICD-10-CM | POA: Diagnosis not present

## 2019-09-14 DIAGNOSIS — S72002D Fracture of unspecified part of neck of left femur, subsequent encounter for closed fracture with routine healing: Secondary | ICD-10-CM | POA: Diagnosis not present

## 2019-09-14 DIAGNOSIS — J189 Pneumonia, unspecified organism: Secondary | ICD-10-CM | POA: Diagnosis not present

## 2019-09-14 DIAGNOSIS — Z741 Need for assistance with personal care: Secondary | ICD-10-CM | POA: Diagnosis not present

## 2019-09-14 DIAGNOSIS — Z85118 Personal history of other malignant neoplasm of bronchus and lung: Secondary | ICD-10-CM | POA: Diagnosis not present

## 2019-09-14 DIAGNOSIS — J9611 Chronic respiratory failure with hypoxia: Secondary | ICD-10-CM | POA: Diagnosis not present

## 2019-09-17 DIAGNOSIS — Z85118 Personal history of other malignant neoplasm of bronchus and lung: Secondary | ICD-10-CM | POA: Diagnosis not present

## 2019-09-17 DIAGNOSIS — J189 Pneumonia, unspecified organism: Secondary | ICD-10-CM | POA: Diagnosis not present

## 2019-09-17 DIAGNOSIS — Z741 Need for assistance with personal care: Secondary | ICD-10-CM | POA: Diagnosis not present

## 2019-09-17 DIAGNOSIS — S72002D Fracture of unspecified part of neck of left femur, subsequent encounter for closed fracture with routine healing: Secondary | ICD-10-CM | POA: Diagnosis not present

## 2019-09-17 DIAGNOSIS — J9611 Chronic respiratory failure with hypoxia: Secondary | ICD-10-CM | POA: Diagnosis not present

## 2019-09-17 DIAGNOSIS — J440 Chronic obstructive pulmonary disease with acute lower respiratory infection: Secondary | ICD-10-CM | POA: Diagnosis not present

## 2019-09-19 DIAGNOSIS — Z85118 Personal history of other malignant neoplasm of bronchus and lung: Secondary | ICD-10-CM | POA: Diagnosis not present

## 2019-09-19 DIAGNOSIS — J9611 Chronic respiratory failure with hypoxia: Secondary | ICD-10-CM | POA: Diagnosis not present

## 2019-09-19 DIAGNOSIS — J440 Chronic obstructive pulmonary disease with acute lower respiratory infection: Secondary | ICD-10-CM | POA: Diagnosis not present

## 2019-09-19 DIAGNOSIS — J189 Pneumonia, unspecified organism: Secondary | ICD-10-CM | POA: Diagnosis not present

## 2019-09-19 DIAGNOSIS — Z741 Need for assistance with personal care: Secondary | ICD-10-CM | POA: Diagnosis not present

## 2019-09-19 DIAGNOSIS — S72002D Fracture of unspecified part of neck of left femur, subsequent encounter for closed fracture with routine healing: Secondary | ICD-10-CM | POA: Diagnosis not present

## 2019-09-20 DIAGNOSIS — Z741 Need for assistance with personal care: Secondary | ICD-10-CM | POA: Diagnosis not present

## 2019-09-20 DIAGNOSIS — Z85118 Personal history of other malignant neoplasm of bronchus and lung: Secondary | ICD-10-CM | POA: Diagnosis not present

## 2019-09-20 DIAGNOSIS — J440 Chronic obstructive pulmonary disease with acute lower respiratory infection: Secondary | ICD-10-CM | POA: Diagnosis not present

## 2019-09-20 DIAGNOSIS — J9611 Chronic respiratory failure with hypoxia: Secondary | ICD-10-CM | POA: Diagnosis not present

## 2019-09-20 DIAGNOSIS — S72002D Fracture of unspecified part of neck of left femur, subsequent encounter for closed fracture with routine healing: Secondary | ICD-10-CM | POA: Diagnosis not present

## 2019-09-20 DIAGNOSIS — J189 Pneumonia, unspecified organism: Secondary | ICD-10-CM | POA: Diagnosis not present

## 2019-09-21 DIAGNOSIS — J440 Chronic obstructive pulmonary disease with acute lower respiratory infection: Secondary | ICD-10-CM | POA: Diagnosis not present

## 2019-09-21 DIAGNOSIS — Z741 Need for assistance with personal care: Secondary | ICD-10-CM | POA: Diagnosis not present

## 2019-09-21 DIAGNOSIS — S72002D Fracture of unspecified part of neck of left femur, subsequent encounter for closed fracture with routine healing: Secondary | ICD-10-CM | POA: Diagnosis not present

## 2019-09-21 DIAGNOSIS — Z85118 Personal history of other malignant neoplasm of bronchus and lung: Secondary | ICD-10-CM | POA: Diagnosis not present

## 2019-09-21 DIAGNOSIS — J189 Pneumonia, unspecified organism: Secondary | ICD-10-CM | POA: Diagnosis not present

## 2019-09-21 DIAGNOSIS — J9611 Chronic respiratory failure with hypoxia: Secondary | ICD-10-CM | POA: Diagnosis not present

## 2019-09-24 DIAGNOSIS — J9611 Chronic respiratory failure with hypoxia: Secondary | ICD-10-CM | POA: Diagnosis not present

## 2019-09-24 DIAGNOSIS — Z741 Need for assistance with personal care: Secondary | ICD-10-CM | POA: Diagnosis not present

## 2019-09-24 DIAGNOSIS — Z85118 Personal history of other malignant neoplasm of bronchus and lung: Secondary | ICD-10-CM | POA: Diagnosis not present

## 2019-09-24 DIAGNOSIS — S72002D Fracture of unspecified part of neck of left femur, subsequent encounter for closed fracture with routine healing: Secondary | ICD-10-CM | POA: Diagnosis not present

## 2019-09-24 DIAGNOSIS — J189 Pneumonia, unspecified organism: Secondary | ICD-10-CM | POA: Diagnosis not present

## 2019-09-24 DIAGNOSIS — J440 Chronic obstructive pulmonary disease with acute lower respiratory infection: Secondary | ICD-10-CM | POA: Diagnosis not present

## 2019-09-26 DIAGNOSIS — S72002D Fracture of unspecified part of neck of left femur, subsequent encounter for closed fracture with routine healing: Secondary | ICD-10-CM | POA: Diagnosis not present

## 2019-09-26 DIAGNOSIS — J9611 Chronic respiratory failure with hypoxia: Secondary | ICD-10-CM | POA: Diagnosis not present

## 2019-09-26 DIAGNOSIS — J189 Pneumonia, unspecified organism: Secondary | ICD-10-CM | POA: Diagnosis not present

## 2019-09-26 DIAGNOSIS — J440 Chronic obstructive pulmonary disease with acute lower respiratory infection: Secondary | ICD-10-CM | POA: Diagnosis not present

## 2019-09-26 DIAGNOSIS — Z85118 Personal history of other malignant neoplasm of bronchus and lung: Secondary | ICD-10-CM | POA: Diagnosis not present

## 2019-09-26 DIAGNOSIS — Z741 Need for assistance with personal care: Secondary | ICD-10-CM | POA: Diagnosis not present

## 2019-09-27 DIAGNOSIS — J440 Chronic obstructive pulmonary disease with acute lower respiratory infection: Secondary | ICD-10-CM | POA: Diagnosis not present

## 2019-09-27 DIAGNOSIS — J9611 Chronic respiratory failure with hypoxia: Secondary | ICD-10-CM | POA: Diagnosis not present

## 2019-09-27 DIAGNOSIS — Z85118 Personal history of other malignant neoplasm of bronchus and lung: Secondary | ICD-10-CM | POA: Diagnosis not present

## 2019-09-27 DIAGNOSIS — J189 Pneumonia, unspecified organism: Secondary | ICD-10-CM | POA: Diagnosis not present

## 2019-09-27 DIAGNOSIS — S72002D Fracture of unspecified part of neck of left femur, subsequent encounter for closed fracture with routine healing: Secondary | ICD-10-CM | POA: Diagnosis not present

## 2019-09-27 DIAGNOSIS — Z741 Need for assistance with personal care: Secondary | ICD-10-CM | POA: Diagnosis not present

## 2019-09-28 DIAGNOSIS — J440 Chronic obstructive pulmonary disease with acute lower respiratory infection: Secondary | ICD-10-CM | POA: Diagnosis not present

## 2019-09-28 DIAGNOSIS — J189 Pneumonia, unspecified organism: Secondary | ICD-10-CM | POA: Diagnosis not present

## 2019-09-28 DIAGNOSIS — J9611 Chronic respiratory failure with hypoxia: Secondary | ICD-10-CM | POA: Diagnosis not present

## 2019-09-28 DIAGNOSIS — Z85118 Personal history of other malignant neoplasm of bronchus and lung: Secondary | ICD-10-CM | POA: Diagnosis not present

## 2019-09-28 DIAGNOSIS — S72002D Fracture of unspecified part of neck of left femur, subsequent encounter for closed fracture with routine healing: Secondary | ICD-10-CM | POA: Diagnosis not present

## 2019-09-28 DIAGNOSIS — Z741 Need for assistance with personal care: Secondary | ICD-10-CM | POA: Diagnosis not present

## 2019-10-01 DIAGNOSIS — Z741 Need for assistance with personal care: Secondary | ICD-10-CM | POA: Diagnosis not present

## 2019-10-01 DIAGNOSIS — S72002D Fracture of unspecified part of neck of left femur, subsequent encounter for closed fracture with routine healing: Secondary | ICD-10-CM | POA: Diagnosis not present

## 2019-10-01 DIAGNOSIS — J189 Pneumonia, unspecified organism: Secondary | ICD-10-CM | POA: Diagnosis not present

## 2019-10-01 DIAGNOSIS — J440 Chronic obstructive pulmonary disease with acute lower respiratory infection: Secondary | ICD-10-CM | POA: Diagnosis not present

## 2019-10-01 DIAGNOSIS — Z85118 Personal history of other malignant neoplasm of bronchus and lung: Secondary | ICD-10-CM | POA: Diagnosis not present

## 2019-10-01 DIAGNOSIS — J9611 Chronic respiratory failure with hypoxia: Secondary | ICD-10-CM | POA: Diagnosis not present

## 2019-10-03 DIAGNOSIS — J440 Chronic obstructive pulmonary disease with acute lower respiratory infection: Secondary | ICD-10-CM | POA: Diagnosis not present

## 2019-10-03 DIAGNOSIS — Z741 Need for assistance with personal care: Secondary | ICD-10-CM | POA: Diagnosis not present

## 2019-10-03 DIAGNOSIS — J189 Pneumonia, unspecified organism: Secondary | ICD-10-CM | POA: Diagnosis not present

## 2019-10-03 DIAGNOSIS — J9611 Chronic respiratory failure with hypoxia: Secondary | ICD-10-CM | POA: Diagnosis not present

## 2019-10-03 DIAGNOSIS — S72002D Fracture of unspecified part of neck of left femur, subsequent encounter for closed fracture with routine healing: Secondary | ICD-10-CM | POA: Diagnosis not present

## 2019-10-03 DIAGNOSIS — Z85118 Personal history of other malignant neoplasm of bronchus and lung: Secondary | ICD-10-CM | POA: Diagnosis not present

## 2019-10-04 DIAGNOSIS — S72002D Fracture of unspecified part of neck of left femur, subsequent encounter for closed fracture with routine healing: Secondary | ICD-10-CM | POA: Diagnosis not present

## 2019-10-04 DIAGNOSIS — J440 Chronic obstructive pulmonary disease with acute lower respiratory infection: Secondary | ICD-10-CM | POA: Diagnosis not present

## 2019-10-04 DIAGNOSIS — Z85118 Personal history of other malignant neoplasm of bronchus and lung: Secondary | ICD-10-CM | POA: Diagnosis not present

## 2019-10-04 DIAGNOSIS — J9611 Chronic respiratory failure with hypoxia: Secondary | ICD-10-CM | POA: Diagnosis not present

## 2019-10-04 DIAGNOSIS — J189 Pneumonia, unspecified organism: Secondary | ICD-10-CM | POA: Diagnosis not present

## 2019-10-04 DIAGNOSIS — Z741 Need for assistance with personal care: Secondary | ICD-10-CM | POA: Diagnosis not present

## 2019-10-05 DIAGNOSIS — J9611 Chronic respiratory failure with hypoxia: Secondary | ICD-10-CM | POA: Diagnosis not present

## 2019-10-05 DIAGNOSIS — J440 Chronic obstructive pulmonary disease with acute lower respiratory infection: Secondary | ICD-10-CM | POA: Diagnosis not present

## 2019-10-05 DIAGNOSIS — Z85118 Personal history of other malignant neoplasm of bronchus and lung: Secondary | ICD-10-CM | POA: Diagnosis not present

## 2019-10-05 DIAGNOSIS — S72002D Fracture of unspecified part of neck of left femur, subsequent encounter for closed fracture with routine healing: Secondary | ICD-10-CM | POA: Diagnosis not present

## 2019-10-05 DIAGNOSIS — J189 Pneumonia, unspecified organism: Secondary | ICD-10-CM | POA: Diagnosis not present

## 2019-10-05 DIAGNOSIS — Z741 Need for assistance with personal care: Secondary | ICD-10-CM | POA: Diagnosis not present

## 2019-10-06 DIAGNOSIS — Z6821 Body mass index (BMI) 21.0-21.9, adult: Secondary | ICD-10-CM | POA: Diagnosis not present

## 2019-10-06 DIAGNOSIS — Z741 Need for assistance with personal care: Secondary | ICD-10-CM | POA: Diagnosis not present

## 2019-10-06 DIAGNOSIS — J9611 Chronic respiratory failure with hypoxia: Secondary | ICD-10-CM | POA: Diagnosis not present

## 2019-10-06 DIAGNOSIS — Z87891 Personal history of nicotine dependence: Secondary | ICD-10-CM | POA: Diagnosis not present

## 2019-10-06 DIAGNOSIS — Z85118 Personal history of other malignant neoplasm of bronchus and lung: Secondary | ICD-10-CM | POA: Diagnosis not present

## 2019-10-06 DIAGNOSIS — Z7952 Long term (current) use of systemic steroids: Secondary | ICD-10-CM | POA: Diagnosis not present

## 2019-10-06 DIAGNOSIS — J440 Chronic obstructive pulmonary disease with acute lower respiratory infection: Secondary | ICD-10-CM | POA: Diagnosis not present

## 2019-10-06 DIAGNOSIS — H3530 Unspecified macular degeneration: Secondary | ICD-10-CM | POA: Diagnosis not present

## 2019-10-06 DIAGNOSIS — J189 Pneumonia, unspecified organism: Secondary | ICD-10-CM | POA: Diagnosis not present

## 2019-10-06 DIAGNOSIS — J302 Other seasonal allergic rhinitis: Secondary | ICD-10-CM | POA: Diagnosis not present

## 2019-10-06 DIAGNOSIS — Z853 Personal history of malignant neoplasm of breast: Secondary | ICD-10-CM | POA: Diagnosis not present

## 2019-10-06 DIAGNOSIS — K219 Gastro-esophageal reflux disease without esophagitis: Secondary | ICD-10-CM | POA: Diagnosis not present

## 2019-10-06 DIAGNOSIS — Z9981 Dependence on supplemental oxygen: Secondary | ICD-10-CM | POA: Diagnosis not present

## 2019-10-06 DIAGNOSIS — S72002D Fracture of unspecified part of neck of left femur, subsequent encounter for closed fracture with routine healing: Secondary | ICD-10-CM | POA: Diagnosis not present

## 2019-10-08 ENCOUNTER — Telehealth: Payer: Self-pay | Admitting: Internal Medicine

## 2019-10-08 DIAGNOSIS — S72002D Fracture of unspecified part of neck of left femur, subsequent encounter for closed fracture with routine healing: Secondary | ICD-10-CM | POA: Diagnosis not present

## 2019-10-08 DIAGNOSIS — J189 Pneumonia, unspecified organism: Secondary | ICD-10-CM | POA: Diagnosis not present

## 2019-10-08 DIAGNOSIS — J9611 Chronic respiratory failure with hypoxia: Secondary | ICD-10-CM | POA: Diagnosis not present

## 2019-10-08 DIAGNOSIS — J440 Chronic obstructive pulmonary disease with acute lower respiratory infection: Secondary | ICD-10-CM | POA: Diagnosis not present

## 2019-10-08 DIAGNOSIS — Z85118 Personal history of other malignant neoplasm of bronchus and lung: Secondary | ICD-10-CM | POA: Diagnosis not present

## 2019-10-08 DIAGNOSIS — Z741 Need for assistance with personal care: Secondary | ICD-10-CM | POA: Diagnosis not present

## 2019-10-08 NOTE — Telephone Encounter (Signed)
According to the daughter patient is at home with home hospice.  She is able to do 30 minutes of ADLs every day.  She is not in a position to visit with me face-to-face.  But she does pray for me every day and she would love for a visit with me.  Therefore, please arrange for a telephone or video visit first available.  15 minutes

## 2019-10-09 NOTE — Telephone Encounter (Signed)
Called and spoke with pt to see if I could get her scheduled for a phone visit with MR. She stated that was fine. Pt has been scheduled a televisit tomorrow 5/5 with MR at 10:15. Nothing further needed.

## 2019-10-10 ENCOUNTER — Ambulatory Visit (INDEPENDENT_AMBULATORY_CARE_PROVIDER_SITE_OTHER): Admitting: Internal Medicine

## 2019-10-10 ENCOUNTER — Other Ambulatory Visit: Payer: Self-pay

## 2019-10-10 DIAGNOSIS — I2510 Atherosclerotic heart disease of native coronary artery without angina pectoris: Secondary | ICD-10-CM | POA: Diagnosis not present

## 2019-10-10 DIAGNOSIS — I2584 Coronary atherosclerosis due to calcified coronary lesion: Secondary | ICD-10-CM | POA: Diagnosis not present

## 2019-10-10 DIAGNOSIS — J449 Chronic obstructive pulmonary disease, unspecified: Secondary | ICD-10-CM

## 2019-10-10 DIAGNOSIS — Z515 Encounter for palliative care: Secondary | ICD-10-CM

## 2019-10-10 DIAGNOSIS — Z85118 Personal history of other malignant neoplasm of bronchus and lung: Secondary | ICD-10-CM | POA: Diagnosis not present

## 2019-10-10 DIAGNOSIS — J9611 Chronic respiratory failure with hypoxia: Secondary | ICD-10-CM

## 2019-10-10 DIAGNOSIS — Z741 Need for assistance with personal care: Secondary | ICD-10-CM | POA: Diagnosis not present

## 2019-10-10 DIAGNOSIS — J440 Chronic obstructive pulmonary disease with acute lower respiratory infection: Secondary | ICD-10-CM | POA: Diagnosis not present

## 2019-10-10 DIAGNOSIS — J189 Pneumonia, unspecified organism: Secondary | ICD-10-CM | POA: Diagnosis not present

## 2019-10-10 DIAGNOSIS — S72002D Fracture of unspecified part of neck of left femur, subsequent encounter for closed fracture with routine healing: Secondary | ICD-10-CM | POA: Diagnosis not present

## 2019-10-10 NOTE — Patient Instructions (Signed)
ICD-10-CM   1. Stage 4 very severe COPD by GOLD classification ([place])  J44.9   2. Chronic respiratory failure with hypoxia (HCC)  J96.11   3. Hospice care patient  Z51.5    Continue hopsice care  followup  - 3 months telephone visit with Dr Chase Caller

## 2019-10-10 NOTE — Progress Notes (Signed)
OV 10/10/2019 -there is a telephone visit .[name]  Patient identified with 2 person identifier.  Risks, benefits and limitations explained of telephone visit.  Subjective:  Patient ID: Candi Leash, female , DOB: 1933/10/19 , age 86 y.o. , MRN: [id number] , ADDRESS: [place] [address]   10/10/2019 -  Telephone visit for copd, hypoxemia, lung cancer   HPI Marquel Spoto Tedder 86 y.o. - Last seen by [facility] in Jan 2021 during hip fracture. S/p surgery. She is now home with home hospice. She is thankful for the care. Says she did not expect to live long but now she is. Currently able to weight bear and walk. Able to cook a bit. Up for 3-4h each day. Sits when she is tired. Learning to be more patient. Currrently, on 4L [place] and stable. Home hospice support +. Gets visits RN once a week and MD every 2 months. Caregiver every few days. Social work and Clinical biochemist support +. Meets a lot of her needs. All her medications being filled. Takes daily prednisone and nebs . Does tend to desaturate with "heavy exertion" but this is baseline     ROS - per HPI     has a past medical history of Arthritis, Asthma, Breast cancer, right breast ([place]), Chronic lower back pain, Colon adenomas, Complication of anesthesia, COPD (chronic obstructive pulmonary disease) ([place]), Coronary artery calcification (09/01/2016), Dysrhythmia, GERD (gastroesophageal reflux disease), Goiter, History of duodenal ulcer, History of radiation therapy (05/24/11-06/02/11), colonic polyps, Hyperlipidemia, Lung cancer, middle lobe ([place]) (04/26/2011), On home oxygen therapy, Osteoporosis, Osteoporosis, Palpitations (01/19/2016), Pneumonia (2017 X 2), Pollen allergy, PVC (premature ventricular contraction) (02/24/2016), Shortness of breath, and Wears glasses.   reports that she quit smoking about 21 years ago. Her smoking use included cigarettes. She has a 20.00 pack-year smoking history. She has never used smokeless  tobacco.  Past Surgical History:  Procedure Laterality Date  . APPENDECTOMY    . BIOPSY THYROID  2016  . BREAST BIOPSY Right 03/2017  . BREAST LUMPECTOMY WITH RADIOACTIVE SEED LOCALIZATION Right 04/11/2017  . BREAST LUMPECTOMY WITH RADIOACTIVE SEED LOCALIZATION Right 04/11/2017   Procedure: RIGHT BREAST LUMPECTOMY WITH RADIOACTIVE SEED LOCALIZATION;  Surgeon: Rolm Bookbinder, MD;  Location: [place];  Service: General;  Laterality: Right;  . CATARACT EXTRACTION W/ INTRAOCULAR LENS  IMPLANT, BILATERAL Bilateral   . COLONOSCOPY W/ BIOPSIES    . DILATION AND CURETTAGE OF UTERUS  "many"  . INTRAMEDULLARY (IM) NAIL INTERTROCHANTERIC Left 05/29/2019   Procedure: INTRAMEDULLARY (IM) NAIL INTERTROCHANTRIC;  Surgeon: Renette Butters, MD;  Location: [place];  Service: Orthopedics;  Laterality: Left;  . TONSILLECTOMY    . TOTAL ABDOMINAL HYSTERECTOMY      Allergies  Allergen Reactions  . Tiotropium Bromide Monohydrate Hives, Shortness Of Breath and Rash    Spiriva   . Codeine Other (See Comments)    Hyperactivity,crying  . Fosamax [Alendronate] Other (See Comments)    Muscle aches  . Asa [Aspirin] Other (See Comments)    Bleeding -ulcers  . Latex Rash    Immunization History  Administered Date(s) Administered  . Influenza Split 04/07/2013, 03/07/2014  . Influenza Whole 02/06/2011, 03/07/2012  . Influenza, High Dose Seasonal PF 03/07/2016, 02/05/2017, 02/09/2018  . Influenza,inj,Quad PF,6+ Mos 01/27/2015  . Influenza-Unspecified 01/05/2014, 03/08/2019  . Pneumococcal Conjugate-13 10/05/2013  . Pneumococcal Polysaccharide-23 06/08/2003  . Pneumococcal-Unspecified 11/05/2012    Family History  Problem Relation Age of Onset  . Emphysema Maternal Uncle   . Heart  disease Mother   . Heart disease Maternal Grandfather   . Rheum arthritis Sister   . Cancer Sister   . Cancer Sister        throat cancer  . Brain cancer Sister   . Cancer Sister   . Ovarian cancer Sister   . Breast  cancer Daughter      Current Outpatient Medications:  .  bisacodyl (DULCOLAX) 10 MG suppository, Place 10 mg rectally daily as needed for moderate constipation (If not relieved by MOM)., Disp: , Rfl:  .  cetirizine (ZYRTEC) 10 MG tablet, Take 1 tablet (10 mg total) by mouth daily., Disp: 30 tablet, Rfl: 0 .  enoxaparin (LOVENOX) 40 MG/0.4ML injection, Inject 0.4 mLs (40 mg total) into the skin daily for 30 doses. For 30 days post op for DVT prophylaxis, Disp: 12 mL, Rfl: 0 .  feeding supplement, ENSURE ENLIVE, (ENSURE ENLIVE) LIQD, Take 237 mLs by mouth 2 (two) times daily between meals., Disp: 237 mL, Rfl: 12 .  ipratropium-albuterol (DUONEB) 0.5-2.5 (3) MG/3ML SOLN, Take 3 mLs by nebulization 4 (four) times daily., Disp: 360 mL, Rfl: 0 .  LORazepam (ATIVAN) 1 MG tablet, Take 1 mg by mouth at bedtime., Disp: , Rfl:  .  magnesium hydroxide (MILK OF MAGNESIA) 400 MG/5ML suspension, Take 30 mLs by mouth daily as needed for mild constipation., Disp: , Rfl:  .  metoprolol tartrate (LOPRESSOR) 25 MG tablet, TAKE 1/2 (ONE-HALF) TABLET BY MOUTH TWICE DAILY, Disp: 30 tablet, Rfl: 0 .  Multiple Vitamins-Minerals (PRESERVISION AREDS 2) CAPS, Take 1 tablet by mouth 2 (two) times a day. , Disp: , Rfl:  .  predniSONE (DELTASONE) 20 MG tablet, Take 1 tablet (20 mg total) by mouth daily with breakfast., Disp: 30 tablet, Rfl: 0 .  Propylene Glycol (SYSTANE COMPLETE) 0.6 % SOLN, Place 1 drop into both eyes 2 (two) times daily., Disp: 15 mL, Rfl: 0 .  sennosides-docusate sodium (SENOKOT-S) 8.6-50 MG tablet, Take 1 tablet by mouth 2 (two) times daily., Disp: , Rfl:  .  Sodium Phosphates (RA SALINE ENEMA RE), Place 1 each rectally daily as needed (Constipation)., Disp: , Rfl:       Objective:   There were no vitals filed for this visit.  Estimated body mass index is 21.78 kg/m as calculated from the following:   Height as of 06/21/19: 4\' 10"  (1.473 m).   Weight as of 06/21/19: 104 lb 3.2 oz (47.3  kg).  @WEIGHTCHANGE @  There were no vitals filed for this visit.   Physical Exam Sounded normal on phone though a bit tired and dyspneic          Assessment:       ICD-10-CM   1. Stage 4 very severe COPD by GOLD classification ([place])  J44.9   2. Chronic respiratory failure with hypoxia (HCC)  J96.11   3. Hospice care patient  Z51.5        Plan:     Patient Instructions     ICD-10-CM   1. Stage 4 very severe COPD by GOLD classification ([place])  J44.9   2. Chronic respiratory failure with hypoxia (HCC)  J96.11   3. Hospice care patient  Z51.5    Continue hopsice care  followup  - 3 months telephone visit with Dr Gaye Alken    Dr. Brand Males, M.D., F.C.C.P,  Pulmonary and Critical Care Medicine Staff Physician, [place] Director - Interstitial Lung Disease  Program  [facility] -  [place] at [place], [place], [address]  Pager: [phone], If no answer or between  15:00h - 7:00h: call [phone] Telephone: [phone]  9:02 AM 10/10/2019

## 2019-10-11 DIAGNOSIS — S72002D Fracture of unspecified part of neck of left femur, subsequent encounter for closed fracture with routine healing: Secondary | ICD-10-CM | POA: Diagnosis not present

## 2019-10-11 DIAGNOSIS — Z741 Need for assistance with personal care: Secondary | ICD-10-CM | POA: Diagnosis not present

## 2019-10-11 DIAGNOSIS — J9611 Chronic respiratory failure with hypoxia: Secondary | ICD-10-CM | POA: Diagnosis not present

## 2019-10-11 DIAGNOSIS — J440 Chronic obstructive pulmonary disease with acute lower respiratory infection: Secondary | ICD-10-CM | POA: Diagnosis not present

## 2019-10-11 DIAGNOSIS — Z85118 Personal history of other malignant neoplasm of bronchus and lung: Secondary | ICD-10-CM | POA: Diagnosis not present

## 2019-10-11 DIAGNOSIS — J189 Pneumonia, unspecified organism: Secondary | ICD-10-CM | POA: Diagnosis not present

## 2019-10-12 DIAGNOSIS — Z741 Need for assistance with personal care: Secondary | ICD-10-CM | POA: Diagnosis not present

## 2019-10-12 DIAGNOSIS — J440 Chronic obstructive pulmonary disease with acute lower respiratory infection: Secondary | ICD-10-CM | POA: Diagnosis not present

## 2019-10-12 DIAGNOSIS — S72002D Fracture of unspecified part of neck of left femur, subsequent encounter for closed fracture with routine healing: Secondary | ICD-10-CM | POA: Diagnosis not present

## 2019-10-12 DIAGNOSIS — Z85118 Personal history of other malignant neoplasm of bronchus and lung: Secondary | ICD-10-CM | POA: Diagnosis not present

## 2019-10-12 DIAGNOSIS — J189 Pneumonia, unspecified organism: Secondary | ICD-10-CM | POA: Diagnosis not present

## 2019-10-12 DIAGNOSIS — J9611 Chronic respiratory failure with hypoxia: Secondary | ICD-10-CM | POA: Diagnosis not present

## 2019-10-15 DIAGNOSIS — Z85118 Personal history of other malignant neoplasm of bronchus and lung: Secondary | ICD-10-CM | POA: Diagnosis not present

## 2019-10-15 DIAGNOSIS — J9611 Chronic respiratory failure with hypoxia: Secondary | ICD-10-CM | POA: Diagnosis not present

## 2019-10-15 DIAGNOSIS — J189 Pneumonia, unspecified organism: Secondary | ICD-10-CM | POA: Diagnosis not present

## 2019-10-15 DIAGNOSIS — S72002D Fracture of unspecified part of neck of left femur, subsequent encounter for closed fracture with routine healing: Secondary | ICD-10-CM | POA: Diagnosis not present

## 2019-10-15 DIAGNOSIS — J440 Chronic obstructive pulmonary disease with acute lower respiratory infection: Secondary | ICD-10-CM | POA: Diagnosis not present

## 2019-10-15 DIAGNOSIS — Z741 Need for assistance with personal care: Secondary | ICD-10-CM | POA: Diagnosis not present

## 2019-10-16 ENCOUNTER — Telehealth: Payer: Self-pay | Admitting: Physician Assistant

## 2019-10-16 NOTE — Telephone Encounter (Signed)
I connected by phone with Heather Barnes and/or patient's caregiver on 10/16/2019 at 9:39 PM to discuss the potential vaccination through our Homebound vaccination initiative.   Prevaccination Checklist for COVID-19 Vaccines  1.  Are you feeling sick today? no  2.  Have you ever received a dose of a COVID-19 vaccine?  yes      If yes, which one? Other   Got first COVID vaccine (dont know name) in January at [facility] while was in rehab. I have spoken with staff - waiting call with vaccine information.  3.  Have you ever had an allergic reaction: (This would include a severe reaction [ e.g., anaphylaxis] that required treatment with epinephrine or EpiPen or that caused you to go to the hospital.  It would also include an allergic reaction that occurred within 4 hours that caused hives, swelling, or respiratory distress, including wheezing.) A.  A previous dose of COVID-19 vaccine. no  B.  A vaccine or injectable therapy that contains multiple components, one of which is a COVID-19 vaccine component, but it is not known which component elicited the immediate reaction. no  C.  Are you allergic to polyethylene glycol? no   4.  Have you ever had an allergic reaction to another vaccine (other than COVID-19 vaccine) or an injectable medication? (This would include a severe reaction [ e.g., anaphylaxis] that required treatment with epinephrine or EpiPen or that caused you to go to the hospital.  It would also include an allergic reaction that occurred within 4 hours that caused hives, swelling, or respiratory distress, including wheezing.)  no   5.  Have you ever had a severe allergic reaction (e.g., anaphylaxis) to something other than a component of the COVID-19 vaccine, or any vaccine or injectable medication?  This would include food, pet, venom, environmental, or oral medication allergies.  no   6.  Have you received any vaccine in the last 14 days? no   7.  Have you ever had a positive test  for COVID-19 or has a doctor ever told you that you had COVID-19?  no   8.  Have you received passive antibody therapy (monoclonal antibodies or convalescent serum) as a treatment for COVID-19? no   9.  Do you have a weakened immune system caused by something such as HIV infection or cancer or do you take immunosuppressive drugs or therapies?  no   10.  Do you have a bleeding disorder or are you taking a blood thinner? no   11.  Are you pregnant or breast-feeding? no   12.  Do you have dermal fillers? no      This patient is a 86 y.o. female that meets the FDA criteria to receive homebound vaccination. Patient or parent/caregiver understands they have the option to accept or refuse homebound vaccination.  Patient passed the pre-screening checklist and would like to proceed with homebound vaccination.  Based on questionnaire above, I recommend the patient be observed for 30 minutes.  There are an estimated # 0 of other household members/caregivers who are also interested in receiving the vaccine.     I will send the patient's information to our scheduling team who will reach out to schedule the patient and potential caregiver/family members for homebound vaccination.  Needs to find out manufacture of first COVID vaccine. I have given my personal number to call back.    [name] 10/16/2019 9:39 PM

## 2019-10-17 DIAGNOSIS — J189 Pneumonia, unspecified organism: Secondary | ICD-10-CM | POA: Diagnosis not present

## 2019-10-17 DIAGNOSIS — Z85118 Personal history of other malignant neoplasm of bronchus and lung: Secondary | ICD-10-CM | POA: Diagnosis not present

## 2019-10-17 DIAGNOSIS — J9611 Chronic respiratory failure with hypoxia: Secondary | ICD-10-CM | POA: Diagnosis not present

## 2019-10-17 DIAGNOSIS — J440 Chronic obstructive pulmonary disease with acute lower respiratory infection: Secondary | ICD-10-CM | POA: Diagnosis not present

## 2019-10-17 DIAGNOSIS — Z741 Need for assistance with personal care: Secondary | ICD-10-CM | POA: Diagnosis not present

## 2019-10-17 DIAGNOSIS — S72002D Fracture of unspecified part of neck of left femur, subsequent encounter for closed fracture with routine healing: Secondary | ICD-10-CM | POA: Diagnosis not present

## 2019-10-18 DIAGNOSIS — Z85118 Personal history of other malignant neoplasm of bronchus and lung: Secondary | ICD-10-CM | POA: Diagnosis not present

## 2019-10-18 DIAGNOSIS — Z741 Need for assistance with personal care: Secondary | ICD-10-CM | POA: Diagnosis not present

## 2019-10-18 DIAGNOSIS — S72002D Fracture of unspecified part of neck of left femur, subsequent encounter for closed fracture with routine healing: Secondary | ICD-10-CM | POA: Diagnosis not present

## 2019-10-18 DIAGNOSIS — J9611 Chronic respiratory failure with hypoxia: Secondary | ICD-10-CM | POA: Diagnosis not present

## 2019-10-18 DIAGNOSIS — J440 Chronic obstructive pulmonary disease with acute lower respiratory infection: Secondary | ICD-10-CM | POA: Diagnosis not present

## 2019-10-18 DIAGNOSIS — J189 Pneumonia, unspecified organism: Secondary | ICD-10-CM | POA: Diagnosis not present

## 2019-10-19 DIAGNOSIS — J440 Chronic obstructive pulmonary disease with acute lower respiratory infection: Secondary | ICD-10-CM | POA: Diagnosis not present

## 2019-10-19 DIAGNOSIS — Z85118 Personal history of other malignant neoplasm of bronchus and lung: Secondary | ICD-10-CM | POA: Diagnosis not present

## 2019-10-19 DIAGNOSIS — J9611 Chronic respiratory failure with hypoxia: Secondary | ICD-10-CM | POA: Diagnosis not present

## 2019-10-19 DIAGNOSIS — J189 Pneumonia, unspecified organism: Secondary | ICD-10-CM | POA: Diagnosis not present

## 2019-10-19 DIAGNOSIS — Z741 Need for assistance with personal care: Secondary | ICD-10-CM | POA: Diagnosis not present

## 2019-10-19 DIAGNOSIS — S72002D Fracture of unspecified part of neck of left femur, subsequent encounter for closed fracture with routine healing: Secondary | ICD-10-CM | POA: Diagnosis not present

## 2019-10-22 DIAGNOSIS — S72002D Fracture of unspecified part of neck of left femur, subsequent encounter for closed fracture with routine healing: Secondary | ICD-10-CM | POA: Diagnosis not present

## 2019-10-22 DIAGNOSIS — J440 Chronic obstructive pulmonary disease with acute lower respiratory infection: Secondary | ICD-10-CM | POA: Diagnosis not present

## 2019-10-22 DIAGNOSIS — J9611 Chronic respiratory failure with hypoxia: Secondary | ICD-10-CM | POA: Diagnosis not present

## 2019-10-22 DIAGNOSIS — Z85118 Personal history of other malignant neoplasm of bronchus and lung: Secondary | ICD-10-CM | POA: Diagnosis not present

## 2019-10-22 DIAGNOSIS — Z741 Need for assistance with personal care: Secondary | ICD-10-CM | POA: Diagnosis not present

## 2019-10-22 DIAGNOSIS — J189 Pneumonia, unspecified organism: Secondary | ICD-10-CM | POA: Diagnosis not present

## 2019-10-23 DIAGNOSIS — J189 Pneumonia, unspecified organism: Secondary | ICD-10-CM | POA: Diagnosis not present

## 2019-10-23 DIAGNOSIS — J9611 Chronic respiratory failure with hypoxia: Secondary | ICD-10-CM | POA: Diagnosis not present

## 2019-10-23 DIAGNOSIS — Z85118 Personal history of other malignant neoplasm of bronchus and lung: Secondary | ICD-10-CM | POA: Diagnosis not present

## 2019-10-23 DIAGNOSIS — J440 Chronic obstructive pulmonary disease with acute lower respiratory infection: Secondary | ICD-10-CM | POA: Diagnosis not present

## 2019-10-23 DIAGNOSIS — Z741 Need for assistance with personal care: Secondary | ICD-10-CM | POA: Diagnosis not present

## 2019-10-23 DIAGNOSIS — S72002D Fracture of unspecified part of neck of left femur, subsequent encounter for closed fracture with routine healing: Secondary | ICD-10-CM | POA: Diagnosis not present

## 2019-10-24 DIAGNOSIS — J9611 Chronic respiratory failure with hypoxia: Secondary | ICD-10-CM | POA: Diagnosis not present

## 2019-10-24 DIAGNOSIS — Z85118 Personal history of other malignant neoplasm of bronchus and lung: Secondary | ICD-10-CM | POA: Diagnosis not present

## 2019-10-24 DIAGNOSIS — J440 Chronic obstructive pulmonary disease with acute lower respiratory infection: Secondary | ICD-10-CM | POA: Diagnosis not present

## 2019-10-24 DIAGNOSIS — S72002D Fracture of unspecified part of neck of left femur, subsequent encounter for closed fracture with routine healing: Secondary | ICD-10-CM | POA: Diagnosis not present

## 2019-10-24 DIAGNOSIS — J189 Pneumonia, unspecified organism: Secondary | ICD-10-CM | POA: Diagnosis not present

## 2019-10-24 DIAGNOSIS — Z741 Need for assistance with personal care: Secondary | ICD-10-CM | POA: Diagnosis not present

## 2019-10-25 DIAGNOSIS — S72002D Fracture of unspecified part of neck of left femur, subsequent encounter for closed fracture with routine healing: Secondary | ICD-10-CM | POA: Diagnosis not present

## 2019-10-25 DIAGNOSIS — J9611 Chronic respiratory failure with hypoxia: Secondary | ICD-10-CM | POA: Diagnosis not present

## 2019-10-25 DIAGNOSIS — J440 Chronic obstructive pulmonary disease with acute lower respiratory infection: Secondary | ICD-10-CM | POA: Diagnosis not present

## 2019-10-25 DIAGNOSIS — Z741 Need for assistance with personal care: Secondary | ICD-10-CM | POA: Diagnosis not present

## 2019-10-25 DIAGNOSIS — Z85118 Personal history of other malignant neoplasm of bronchus and lung: Secondary | ICD-10-CM | POA: Diagnosis not present

## 2019-10-25 DIAGNOSIS — J189 Pneumonia, unspecified organism: Secondary | ICD-10-CM | POA: Diagnosis not present

## 2019-10-26 DIAGNOSIS — Z741 Need for assistance with personal care: Secondary | ICD-10-CM | POA: Diagnosis not present

## 2019-10-26 DIAGNOSIS — J9611 Chronic respiratory failure with hypoxia: Secondary | ICD-10-CM | POA: Diagnosis not present

## 2019-10-26 DIAGNOSIS — Z85118 Personal history of other malignant neoplasm of bronchus and lung: Secondary | ICD-10-CM | POA: Diagnosis not present

## 2019-10-26 DIAGNOSIS — J189 Pneumonia, unspecified organism: Secondary | ICD-10-CM | POA: Diagnosis not present

## 2019-10-26 DIAGNOSIS — S72002D Fracture of unspecified part of neck of left femur, subsequent encounter for closed fracture with routine healing: Secondary | ICD-10-CM | POA: Diagnosis not present

## 2019-10-26 DIAGNOSIS — J440 Chronic obstructive pulmonary disease with acute lower respiratory infection: Secondary | ICD-10-CM | POA: Diagnosis not present

## 2019-10-29 DIAGNOSIS — J440 Chronic obstructive pulmonary disease with acute lower respiratory infection: Secondary | ICD-10-CM | POA: Diagnosis not present

## 2019-10-29 DIAGNOSIS — J189 Pneumonia, unspecified organism: Secondary | ICD-10-CM | POA: Diagnosis not present

## 2019-10-29 DIAGNOSIS — Z85118 Personal history of other malignant neoplasm of bronchus and lung: Secondary | ICD-10-CM | POA: Diagnosis not present

## 2019-10-29 DIAGNOSIS — S72002D Fracture of unspecified part of neck of left femur, subsequent encounter for closed fracture with routine healing: Secondary | ICD-10-CM | POA: Diagnosis not present

## 2019-10-29 DIAGNOSIS — J9611 Chronic respiratory failure with hypoxia: Secondary | ICD-10-CM | POA: Diagnosis not present

## 2019-10-29 DIAGNOSIS — Z741 Need for assistance with personal care: Secondary | ICD-10-CM | POA: Diagnosis not present

## 2019-10-31 DIAGNOSIS — Z85118 Personal history of other malignant neoplasm of bronchus and lung: Secondary | ICD-10-CM | POA: Diagnosis not present

## 2019-10-31 DIAGNOSIS — J9611 Chronic respiratory failure with hypoxia: Secondary | ICD-10-CM | POA: Diagnosis not present

## 2019-10-31 DIAGNOSIS — Z741 Need for assistance with personal care: Secondary | ICD-10-CM | POA: Diagnosis not present

## 2019-10-31 DIAGNOSIS — J440 Chronic obstructive pulmonary disease with acute lower respiratory infection: Secondary | ICD-10-CM | POA: Diagnosis not present

## 2019-10-31 DIAGNOSIS — S72002D Fracture of unspecified part of neck of left femur, subsequent encounter for closed fracture with routine healing: Secondary | ICD-10-CM | POA: Diagnosis not present

## 2019-10-31 DIAGNOSIS — J189 Pneumonia, unspecified organism: Secondary | ICD-10-CM | POA: Diagnosis not present

## 2019-11-01 DIAGNOSIS — J189 Pneumonia, unspecified organism: Secondary | ICD-10-CM | POA: Diagnosis not present

## 2019-11-01 DIAGNOSIS — J440 Chronic obstructive pulmonary disease with acute lower respiratory infection: Secondary | ICD-10-CM | POA: Diagnosis not present

## 2019-11-01 DIAGNOSIS — S72002D Fracture of unspecified part of neck of left femur, subsequent encounter for closed fracture with routine healing: Secondary | ICD-10-CM | POA: Diagnosis not present

## 2019-11-01 DIAGNOSIS — Z85118 Personal history of other malignant neoplasm of bronchus and lung: Secondary | ICD-10-CM | POA: Diagnosis not present

## 2019-11-01 DIAGNOSIS — J9611 Chronic respiratory failure with hypoxia: Secondary | ICD-10-CM | POA: Diagnosis not present

## 2019-11-01 DIAGNOSIS — Z741 Need for assistance with personal care: Secondary | ICD-10-CM | POA: Diagnosis not present

## 2019-11-02 DIAGNOSIS — Z85118 Personal history of other malignant neoplasm of bronchus and lung: Secondary | ICD-10-CM | POA: Diagnosis not present

## 2019-11-02 DIAGNOSIS — J189 Pneumonia, unspecified organism: Secondary | ICD-10-CM | POA: Diagnosis not present

## 2019-11-02 DIAGNOSIS — Z741 Need for assistance with personal care: Secondary | ICD-10-CM | POA: Diagnosis not present

## 2019-11-02 DIAGNOSIS — S72002D Fracture of unspecified part of neck of left femur, subsequent encounter for closed fracture with routine healing: Secondary | ICD-10-CM | POA: Diagnosis not present

## 2019-11-02 DIAGNOSIS — J9611 Chronic respiratory failure with hypoxia: Secondary | ICD-10-CM | POA: Diagnosis not present

## 2019-11-02 DIAGNOSIS — J440 Chronic obstructive pulmonary disease with acute lower respiratory infection: Secondary | ICD-10-CM | POA: Diagnosis not present

## 2019-11-14 ENCOUNTER — Ambulatory Visit: Payer: Medicare Other | Attending: Critical Care Medicine

## 2019-11-14 DIAGNOSIS — Z23 Encounter for immunization: Secondary | ICD-10-CM

## 2019-11-14 NOTE — Progress Notes (Signed)
   Covid-19 Vaccination Clinic  Name:  Heather Barnes    MRN: [id number] DOB: Aug 31, 1933  11/14/2019  Ms. [name] was observed post Covid-19 immunization for 15 minutes without incident. She was provided with Vaccine Information Sheet and instruction to access the V-Safe system.   Ms. [name] was instructed to call 911 with any severe reactions post vaccine: [name] Difficulty breathing  . Swelling of face and throat  . A fast heartbeat  . A bad rash all over body  . Dizziness and weakness   Immunizations Administered    Name Date Dose VIS Date Route   Moderna COVID-19 Vaccine 11/14/2019 10:40 AM 0.5 mL 05/2019 Intramuscular   Manufacturer: Moderna   Lot: [id number]   Mellen: [id number]

## 2019-12-06 DIAGNOSIS — J302 Other seasonal allergic rhinitis: Secondary | ICD-10-CM | POA: Diagnosis not present

## 2019-12-06 DIAGNOSIS — K219 Gastro-esophageal reflux disease without esophagitis: Secondary | ICD-10-CM | POA: Diagnosis not present

## 2019-12-06 DIAGNOSIS — J440 Chronic obstructive pulmonary disease with acute lower respiratory infection: Secondary | ICD-10-CM | POA: Diagnosis not present

## 2019-12-06 DIAGNOSIS — Z741 Need for assistance with personal care: Secondary | ICD-10-CM | POA: Diagnosis not present

## 2019-12-06 DIAGNOSIS — Z85118 Personal history of other malignant neoplasm of bronchus and lung: Secondary | ICD-10-CM | POA: Diagnosis not present

## 2019-12-06 DIAGNOSIS — S72002D Fracture of unspecified part of neck of left femur, subsequent encounter for closed fracture with routine healing: Secondary | ICD-10-CM | POA: Diagnosis not present

## 2019-12-06 DIAGNOSIS — Z9981 Dependence on supplemental oxygen: Secondary | ICD-10-CM | POA: Diagnosis not present

## 2019-12-06 DIAGNOSIS — Z87891 Personal history of nicotine dependence: Secondary | ICD-10-CM | POA: Diagnosis not present

## 2019-12-06 DIAGNOSIS — J9611 Chronic respiratory failure with hypoxia: Secondary | ICD-10-CM | POA: Diagnosis not present

## 2019-12-06 DIAGNOSIS — Z7952 Long term (current) use of systemic steroids: Secondary | ICD-10-CM | POA: Diagnosis not present

## 2019-12-06 DIAGNOSIS — Z6821 Body mass index (BMI) 21.0-21.9, adult: Secondary | ICD-10-CM | POA: Diagnosis not present

## 2019-12-06 DIAGNOSIS — H3530 Unspecified macular degeneration: Secondary | ICD-10-CM | POA: Diagnosis not present

## 2019-12-06 DIAGNOSIS — Z853 Personal history of malignant neoplasm of breast: Secondary | ICD-10-CM | POA: Diagnosis not present

## 2019-12-06 DIAGNOSIS — J189 Pneumonia, unspecified organism: Secondary | ICD-10-CM | POA: Diagnosis not present

## 2019-12-07 DIAGNOSIS — Z741 Need for assistance with personal care: Secondary | ICD-10-CM | POA: Diagnosis not present

## 2019-12-07 DIAGNOSIS — J189 Pneumonia, unspecified organism: Secondary | ICD-10-CM | POA: Diagnosis not present

## 2019-12-07 DIAGNOSIS — Z85118 Personal history of other malignant neoplasm of bronchus and lung: Secondary | ICD-10-CM | POA: Diagnosis not present

## 2019-12-07 DIAGNOSIS — S72002D Fracture of unspecified part of neck of left femur, subsequent encounter for closed fracture with routine healing: Secondary | ICD-10-CM | POA: Diagnosis not present

## 2019-12-07 DIAGNOSIS — J9611 Chronic respiratory failure with hypoxia: Secondary | ICD-10-CM | POA: Diagnosis not present

## 2019-12-07 DIAGNOSIS — J440 Chronic obstructive pulmonary disease with acute lower respiratory infection: Secondary | ICD-10-CM | POA: Diagnosis not present

## 2019-12-10 DIAGNOSIS — J189 Pneumonia, unspecified organism: Secondary | ICD-10-CM | POA: Diagnosis not present

## 2019-12-10 DIAGNOSIS — S72002D Fracture of unspecified part of neck of left femur, subsequent encounter for closed fracture with routine healing: Secondary | ICD-10-CM | POA: Diagnosis not present

## 2019-12-10 DIAGNOSIS — Z741 Need for assistance with personal care: Secondary | ICD-10-CM | POA: Diagnosis not present

## 2019-12-10 DIAGNOSIS — J9611 Chronic respiratory failure with hypoxia: Secondary | ICD-10-CM | POA: Diagnosis not present

## 2019-12-10 DIAGNOSIS — Z85118 Personal history of other malignant neoplasm of bronchus and lung: Secondary | ICD-10-CM | POA: Diagnosis not present

## 2019-12-10 DIAGNOSIS — J440 Chronic obstructive pulmonary disease with acute lower respiratory infection: Secondary | ICD-10-CM | POA: Diagnosis not present

## 2019-12-12 DIAGNOSIS — J9611 Chronic respiratory failure with hypoxia: Secondary | ICD-10-CM | POA: Diagnosis not present

## 2019-12-12 DIAGNOSIS — Z741 Need for assistance with personal care: Secondary | ICD-10-CM | POA: Diagnosis not present

## 2019-12-12 DIAGNOSIS — Z85118 Personal history of other malignant neoplasm of bronchus and lung: Secondary | ICD-10-CM | POA: Diagnosis not present

## 2019-12-12 DIAGNOSIS — J440 Chronic obstructive pulmonary disease with acute lower respiratory infection: Secondary | ICD-10-CM | POA: Diagnosis not present

## 2019-12-12 DIAGNOSIS — J189 Pneumonia, unspecified organism: Secondary | ICD-10-CM | POA: Diagnosis not present

## 2019-12-12 DIAGNOSIS — S72002D Fracture of unspecified part of neck of left femur, subsequent encounter for closed fracture with routine healing: Secondary | ICD-10-CM | POA: Diagnosis not present

## 2019-12-13 DIAGNOSIS — Z741 Need for assistance with personal care: Secondary | ICD-10-CM | POA: Diagnosis not present

## 2019-12-13 DIAGNOSIS — J9611 Chronic respiratory failure with hypoxia: Secondary | ICD-10-CM | POA: Diagnosis not present

## 2019-12-13 DIAGNOSIS — J189 Pneumonia, unspecified organism: Secondary | ICD-10-CM | POA: Diagnosis not present

## 2019-12-13 DIAGNOSIS — S72002D Fracture of unspecified part of neck of left femur, subsequent encounter for closed fracture with routine healing: Secondary | ICD-10-CM | POA: Diagnosis not present

## 2019-12-13 DIAGNOSIS — J440 Chronic obstructive pulmonary disease with acute lower respiratory infection: Secondary | ICD-10-CM | POA: Diagnosis not present

## 2019-12-13 DIAGNOSIS — Z85118 Personal history of other malignant neoplasm of bronchus and lung: Secondary | ICD-10-CM | POA: Diagnosis not present

## 2019-12-14 DIAGNOSIS — J189 Pneumonia, unspecified organism: Secondary | ICD-10-CM | POA: Diagnosis not present

## 2019-12-14 DIAGNOSIS — J440 Chronic obstructive pulmonary disease with acute lower respiratory infection: Secondary | ICD-10-CM | POA: Diagnosis not present

## 2019-12-14 DIAGNOSIS — J9611 Chronic respiratory failure with hypoxia: Secondary | ICD-10-CM | POA: Diagnosis not present

## 2019-12-14 DIAGNOSIS — Z85118 Personal history of other malignant neoplasm of bronchus and lung: Secondary | ICD-10-CM | POA: Diagnosis not present

## 2019-12-14 DIAGNOSIS — S72002D Fracture of unspecified part of neck of left femur, subsequent encounter for closed fracture with routine healing: Secondary | ICD-10-CM | POA: Diagnosis not present

## 2019-12-14 DIAGNOSIS — Z741 Need for assistance with personal care: Secondary | ICD-10-CM | POA: Diagnosis not present

## 2019-12-17 DIAGNOSIS — Z85118 Personal history of other malignant neoplasm of bronchus and lung: Secondary | ICD-10-CM | POA: Diagnosis not present

## 2019-12-17 DIAGNOSIS — J189 Pneumonia, unspecified organism: Secondary | ICD-10-CM | POA: Diagnosis not present

## 2019-12-17 DIAGNOSIS — S72002D Fracture of unspecified part of neck of left femur, subsequent encounter for closed fracture with routine healing: Secondary | ICD-10-CM | POA: Diagnosis not present

## 2019-12-17 DIAGNOSIS — J9611 Chronic respiratory failure with hypoxia: Secondary | ICD-10-CM | POA: Diagnosis not present

## 2019-12-17 DIAGNOSIS — J440 Chronic obstructive pulmonary disease with acute lower respiratory infection: Secondary | ICD-10-CM | POA: Diagnosis not present

## 2019-12-17 DIAGNOSIS — Z741 Need for assistance with personal care: Secondary | ICD-10-CM | POA: Diagnosis not present

## 2019-12-19 DIAGNOSIS — J189 Pneumonia, unspecified organism: Secondary | ICD-10-CM | POA: Diagnosis not present

## 2019-12-19 DIAGNOSIS — Z741 Need for assistance with personal care: Secondary | ICD-10-CM | POA: Diagnosis not present

## 2019-12-19 DIAGNOSIS — J440 Chronic obstructive pulmonary disease with acute lower respiratory infection: Secondary | ICD-10-CM | POA: Diagnosis not present

## 2019-12-19 DIAGNOSIS — J9611 Chronic respiratory failure with hypoxia: Secondary | ICD-10-CM | POA: Diagnosis not present

## 2019-12-19 DIAGNOSIS — S72002D Fracture of unspecified part of neck of left femur, subsequent encounter for closed fracture with routine healing: Secondary | ICD-10-CM | POA: Diagnosis not present

## 2019-12-19 DIAGNOSIS — Z85118 Personal history of other malignant neoplasm of bronchus and lung: Secondary | ICD-10-CM | POA: Diagnosis not present

## 2019-12-20 DIAGNOSIS — J440 Chronic obstructive pulmonary disease with acute lower respiratory infection: Secondary | ICD-10-CM | POA: Diagnosis not present

## 2019-12-20 DIAGNOSIS — Z85118 Personal history of other malignant neoplasm of bronchus and lung: Secondary | ICD-10-CM | POA: Diagnosis not present

## 2019-12-20 DIAGNOSIS — J189 Pneumonia, unspecified organism: Secondary | ICD-10-CM | POA: Diagnosis not present

## 2019-12-20 DIAGNOSIS — J9611 Chronic respiratory failure with hypoxia: Secondary | ICD-10-CM | POA: Diagnosis not present

## 2019-12-20 DIAGNOSIS — Z741 Need for assistance with personal care: Secondary | ICD-10-CM | POA: Diagnosis not present

## 2019-12-20 DIAGNOSIS — S72002D Fracture of unspecified part of neck of left femur, subsequent encounter for closed fracture with routine healing: Secondary | ICD-10-CM | POA: Diagnosis not present

## 2019-12-21 DIAGNOSIS — J189 Pneumonia, unspecified organism: Secondary | ICD-10-CM | POA: Diagnosis not present

## 2019-12-21 DIAGNOSIS — S72002D Fracture of unspecified part of neck of left femur, subsequent encounter for closed fracture with routine healing: Secondary | ICD-10-CM | POA: Diagnosis not present

## 2019-12-21 DIAGNOSIS — Z741 Need for assistance with personal care: Secondary | ICD-10-CM | POA: Diagnosis not present

## 2019-12-21 DIAGNOSIS — J440 Chronic obstructive pulmonary disease with acute lower respiratory infection: Secondary | ICD-10-CM | POA: Diagnosis not present

## 2019-12-21 DIAGNOSIS — Z85118 Personal history of other malignant neoplasm of bronchus and lung: Secondary | ICD-10-CM | POA: Diagnosis not present

## 2019-12-21 DIAGNOSIS — J9611 Chronic respiratory failure with hypoxia: Secondary | ICD-10-CM | POA: Diagnosis not present

## 2019-12-24 DIAGNOSIS — Z741 Need for assistance with personal care: Secondary | ICD-10-CM | POA: Diagnosis not present

## 2019-12-24 DIAGNOSIS — J189 Pneumonia, unspecified organism: Secondary | ICD-10-CM | POA: Diagnosis not present

## 2019-12-24 DIAGNOSIS — S72002D Fracture of unspecified part of neck of left femur, subsequent encounter for closed fracture with routine healing: Secondary | ICD-10-CM | POA: Diagnosis not present

## 2019-12-24 DIAGNOSIS — Z85118 Personal history of other malignant neoplasm of bronchus and lung: Secondary | ICD-10-CM | POA: Diagnosis not present

## 2019-12-24 DIAGNOSIS — J440 Chronic obstructive pulmonary disease with acute lower respiratory infection: Secondary | ICD-10-CM | POA: Diagnosis not present

## 2019-12-24 DIAGNOSIS — J9611 Chronic respiratory failure with hypoxia: Secondary | ICD-10-CM | POA: Diagnosis not present

## 2019-12-26 DIAGNOSIS — Z85118 Personal history of other malignant neoplasm of bronchus and lung: Secondary | ICD-10-CM | POA: Diagnosis not present

## 2019-12-26 DIAGNOSIS — J9611 Chronic respiratory failure with hypoxia: Secondary | ICD-10-CM | POA: Diagnosis not present

## 2019-12-26 DIAGNOSIS — S72002D Fracture of unspecified part of neck of left femur, subsequent encounter for closed fracture with routine healing: Secondary | ICD-10-CM | POA: Diagnosis not present

## 2019-12-26 DIAGNOSIS — J189 Pneumonia, unspecified organism: Secondary | ICD-10-CM | POA: Diagnosis not present

## 2019-12-26 DIAGNOSIS — Z741 Need for assistance with personal care: Secondary | ICD-10-CM | POA: Diagnosis not present

## 2019-12-26 DIAGNOSIS — J440 Chronic obstructive pulmonary disease with acute lower respiratory infection: Secondary | ICD-10-CM | POA: Diagnosis not present

## 2019-12-27 DIAGNOSIS — J9611 Chronic respiratory failure with hypoxia: Secondary | ICD-10-CM | POA: Diagnosis not present

## 2019-12-27 DIAGNOSIS — J189 Pneumonia, unspecified organism: Secondary | ICD-10-CM | POA: Diagnosis not present

## 2019-12-27 DIAGNOSIS — Z85118 Personal history of other malignant neoplasm of bronchus and lung: Secondary | ICD-10-CM | POA: Diagnosis not present

## 2019-12-27 DIAGNOSIS — Z741 Need for assistance with personal care: Secondary | ICD-10-CM | POA: Diagnosis not present

## 2019-12-27 DIAGNOSIS — S72002D Fracture of unspecified part of neck of left femur, subsequent encounter for closed fracture with routine healing: Secondary | ICD-10-CM | POA: Diagnosis not present

## 2019-12-27 DIAGNOSIS — J440 Chronic obstructive pulmonary disease with acute lower respiratory infection: Secondary | ICD-10-CM | POA: Diagnosis not present

## 2019-12-28 DIAGNOSIS — J9611 Chronic respiratory failure with hypoxia: Secondary | ICD-10-CM | POA: Diagnosis not present

## 2019-12-28 DIAGNOSIS — Z85118 Personal history of other malignant neoplasm of bronchus and lung: Secondary | ICD-10-CM | POA: Diagnosis not present

## 2019-12-28 DIAGNOSIS — S72002D Fracture of unspecified part of neck of left femur, subsequent encounter for closed fracture with routine healing: Secondary | ICD-10-CM | POA: Diagnosis not present

## 2019-12-28 DIAGNOSIS — J440 Chronic obstructive pulmonary disease with acute lower respiratory infection: Secondary | ICD-10-CM | POA: Diagnosis not present

## 2019-12-28 DIAGNOSIS — J189 Pneumonia, unspecified organism: Secondary | ICD-10-CM | POA: Diagnosis not present

## 2019-12-28 DIAGNOSIS — Z741 Need for assistance with personal care: Secondary | ICD-10-CM | POA: Diagnosis not present

## 2019-12-31 DIAGNOSIS — Z741 Need for assistance with personal care: Secondary | ICD-10-CM | POA: Diagnosis not present

## 2019-12-31 DIAGNOSIS — J9611 Chronic respiratory failure with hypoxia: Secondary | ICD-10-CM | POA: Diagnosis not present

## 2019-12-31 DIAGNOSIS — J189 Pneumonia, unspecified organism: Secondary | ICD-10-CM | POA: Diagnosis not present

## 2019-12-31 DIAGNOSIS — Z85118 Personal history of other malignant neoplasm of bronchus and lung: Secondary | ICD-10-CM | POA: Diagnosis not present

## 2019-12-31 DIAGNOSIS — S72002D Fracture of unspecified part of neck of left femur, subsequent encounter for closed fracture with routine healing: Secondary | ICD-10-CM | POA: Diagnosis not present

## 2019-12-31 DIAGNOSIS — J440 Chronic obstructive pulmonary disease with acute lower respiratory infection: Secondary | ICD-10-CM | POA: Diagnosis not present

## 2020-01-02 DIAGNOSIS — S72002D Fracture of unspecified part of neck of left femur, subsequent encounter for closed fracture with routine healing: Secondary | ICD-10-CM | POA: Diagnosis not present

## 2020-01-02 DIAGNOSIS — Z85118 Personal history of other malignant neoplasm of bronchus and lung: Secondary | ICD-10-CM | POA: Diagnosis not present

## 2020-01-02 DIAGNOSIS — Z741 Need for assistance with personal care: Secondary | ICD-10-CM | POA: Diagnosis not present

## 2020-01-02 DIAGNOSIS — J440 Chronic obstructive pulmonary disease with acute lower respiratory infection: Secondary | ICD-10-CM | POA: Diagnosis not present

## 2020-01-02 DIAGNOSIS — J9611 Chronic respiratory failure with hypoxia: Secondary | ICD-10-CM | POA: Diagnosis not present

## 2020-01-02 DIAGNOSIS — J189 Pneumonia, unspecified organism: Secondary | ICD-10-CM | POA: Diagnosis not present

## 2020-01-03 DIAGNOSIS — S72002D Fracture of unspecified part of neck of left femur, subsequent encounter for closed fracture with routine healing: Secondary | ICD-10-CM | POA: Diagnosis not present

## 2020-01-03 DIAGNOSIS — Z85118 Personal history of other malignant neoplasm of bronchus and lung: Secondary | ICD-10-CM | POA: Diagnosis not present

## 2020-01-03 DIAGNOSIS — J440 Chronic obstructive pulmonary disease with acute lower respiratory infection: Secondary | ICD-10-CM | POA: Diagnosis not present

## 2020-01-03 DIAGNOSIS — J189 Pneumonia, unspecified organism: Secondary | ICD-10-CM | POA: Diagnosis not present

## 2020-01-03 DIAGNOSIS — J9611 Chronic respiratory failure with hypoxia: Secondary | ICD-10-CM | POA: Diagnosis not present

## 2020-01-03 DIAGNOSIS — Z741 Need for assistance with personal care: Secondary | ICD-10-CM | POA: Diagnosis not present

## 2020-01-04 DIAGNOSIS — J440 Chronic obstructive pulmonary disease with acute lower respiratory infection: Secondary | ICD-10-CM | POA: Diagnosis not present

## 2020-01-04 DIAGNOSIS — J189 Pneumonia, unspecified organism: Secondary | ICD-10-CM | POA: Diagnosis not present

## 2020-01-04 DIAGNOSIS — Z85118 Personal history of other malignant neoplasm of bronchus and lung: Secondary | ICD-10-CM | POA: Diagnosis not present

## 2020-01-04 DIAGNOSIS — J9611 Chronic respiratory failure with hypoxia: Secondary | ICD-10-CM | POA: Diagnosis not present

## 2020-01-04 DIAGNOSIS — Z741 Need for assistance with personal care: Secondary | ICD-10-CM | POA: Diagnosis not present

## 2020-01-04 DIAGNOSIS — S72002D Fracture of unspecified part of neck of left femur, subsequent encounter for closed fracture with routine healing: Secondary | ICD-10-CM | POA: Diagnosis not present

## 2020-01-06 DIAGNOSIS — Z87891 Personal history of nicotine dependence: Secondary | ICD-10-CM | POA: Diagnosis not present

## 2020-01-06 DIAGNOSIS — J9611 Chronic respiratory failure with hypoxia: Secondary | ICD-10-CM | POA: Diagnosis not present

## 2020-01-06 DIAGNOSIS — J302 Other seasonal allergic rhinitis: Secondary | ICD-10-CM | POA: Diagnosis not present

## 2020-01-06 DIAGNOSIS — K219 Gastro-esophageal reflux disease without esophagitis: Secondary | ICD-10-CM | POA: Diagnosis not present

## 2020-01-06 DIAGNOSIS — S72002D Fracture of unspecified part of neck of left femur, subsequent encounter for closed fracture with routine healing: Secondary | ICD-10-CM | POA: Diagnosis not present

## 2020-01-06 DIAGNOSIS — H3530 Unspecified macular degeneration: Secondary | ICD-10-CM | POA: Diagnosis not present

## 2020-01-06 DIAGNOSIS — Z7952 Long term (current) use of systemic steroids: Secondary | ICD-10-CM | POA: Diagnosis not present

## 2020-01-06 DIAGNOSIS — Z741 Need for assistance with personal care: Secondary | ICD-10-CM | POA: Diagnosis not present

## 2020-01-06 DIAGNOSIS — Z9981 Dependence on supplemental oxygen: Secondary | ICD-10-CM | POA: Diagnosis not present

## 2020-01-06 DIAGNOSIS — J189 Pneumonia, unspecified organism: Secondary | ICD-10-CM | POA: Diagnosis not present

## 2020-01-06 DIAGNOSIS — Z853 Personal history of malignant neoplasm of breast: Secondary | ICD-10-CM | POA: Diagnosis not present

## 2020-01-06 DIAGNOSIS — Z6821 Body mass index (BMI) 21.0-21.9, adult: Secondary | ICD-10-CM | POA: Diagnosis not present

## 2020-01-06 DIAGNOSIS — Z85118 Personal history of other malignant neoplasm of bronchus and lung: Secondary | ICD-10-CM | POA: Diagnosis not present

## 2020-01-06 DIAGNOSIS — J440 Chronic obstructive pulmonary disease with acute lower respiratory infection: Secondary | ICD-10-CM | POA: Diagnosis not present

## 2020-01-07 DIAGNOSIS — Z741 Need for assistance with personal care: Secondary | ICD-10-CM | POA: Diagnosis not present

## 2020-01-07 DIAGNOSIS — J9611 Chronic respiratory failure with hypoxia: Secondary | ICD-10-CM | POA: Diagnosis not present

## 2020-01-07 DIAGNOSIS — Z85118 Personal history of other malignant neoplasm of bronchus and lung: Secondary | ICD-10-CM | POA: Diagnosis not present

## 2020-01-07 DIAGNOSIS — S72002D Fracture of unspecified part of neck of left femur, subsequent encounter for closed fracture with routine healing: Secondary | ICD-10-CM | POA: Diagnosis not present

## 2020-01-07 DIAGNOSIS — J189 Pneumonia, unspecified organism: Secondary | ICD-10-CM | POA: Diagnosis not present

## 2020-01-07 DIAGNOSIS — J440 Chronic obstructive pulmonary disease with acute lower respiratory infection: Secondary | ICD-10-CM | POA: Diagnosis not present

## 2020-01-08 DIAGNOSIS — J189 Pneumonia, unspecified organism: Secondary | ICD-10-CM | POA: Diagnosis not present

## 2020-01-08 DIAGNOSIS — S72002D Fracture of unspecified part of neck of left femur, subsequent encounter for closed fracture with routine healing: Secondary | ICD-10-CM | POA: Diagnosis not present

## 2020-01-08 DIAGNOSIS — Z85118 Personal history of other malignant neoplasm of bronchus and lung: Secondary | ICD-10-CM | POA: Diagnosis not present

## 2020-01-08 DIAGNOSIS — J9611 Chronic respiratory failure with hypoxia: Secondary | ICD-10-CM | POA: Diagnosis not present

## 2020-01-08 DIAGNOSIS — J440 Chronic obstructive pulmonary disease with acute lower respiratory infection: Secondary | ICD-10-CM | POA: Diagnosis not present

## 2020-01-08 DIAGNOSIS — Z741 Need for assistance with personal care: Secondary | ICD-10-CM | POA: Diagnosis not present

## 2020-01-09 DIAGNOSIS — J440 Chronic obstructive pulmonary disease with acute lower respiratory infection: Secondary | ICD-10-CM | POA: Diagnosis not present

## 2020-01-09 DIAGNOSIS — Z741 Need for assistance with personal care: Secondary | ICD-10-CM | POA: Diagnosis not present

## 2020-01-09 DIAGNOSIS — J189 Pneumonia, unspecified organism: Secondary | ICD-10-CM | POA: Diagnosis not present

## 2020-01-09 DIAGNOSIS — J9611 Chronic respiratory failure with hypoxia: Secondary | ICD-10-CM | POA: Diagnosis not present

## 2020-01-09 DIAGNOSIS — Z85118 Personal history of other malignant neoplasm of bronchus and lung: Secondary | ICD-10-CM | POA: Diagnosis not present

## 2020-01-09 DIAGNOSIS — S72002D Fracture of unspecified part of neck of left femur, subsequent encounter for closed fracture with routine healing: Secondary | ICD-10-CM | POA: Diagnosis not present

## 2020-01-10 DIAGNOSIS — J189 Pneumonia, unspecified organism: Secondary | ICD-10-CM | POA: Diagnosis not present

## 2020-01-10 DIAGNOSIS — Z85118 Personal history of other malignant neoplasm of bronchus and lung: Secondary | ICD-10-CM | POA: Diagnosis not present

## 2020-01-10 DIAGNOSIS — J440 Chronic obstructive pulmonary disease with acute lower respiratory infection: Secondary | ICD-10-CM | POA: Diagnosis not present

## 2020-01-10 DIAGNOSIS — S72002D Fracture of unspecified part of neck of left femur, subsequent encounter for closed fracture with routine healing: Secondary | ICD-10-CM | POA: Diagnosis not present

## 2020-01-10 DIAGNOSIS — Z741 Need for assistance with personal care: Secondary | ICD-10-CM | POA: Diagnosis not present

## 2020-01-10 DIAGNOSIS — J9611 Chronic respiratory failure with hypoxia: Secondary | ICD-10-CM | POA: Diagnosis not present

## 2020-01-11 DIAGNOSIS — J189 Pneumonia, unspecified organism: Secondary | ICD-10-CM | POA: Diagnosis not present

## 2020-01-11 DIAGNOSIS — J440 Chronic obstructive pulmonary disease with acute lower respiratory infection: Secondary | ICD-10-CM | POA: Diagnosis not present

## 2020-01-11 DIAGNOSIS — Z741 Need for assistance with personal care: Secondary | ICD-10-CM | POA: Diagnosis not present

## 2020-01-11 DIAGNOSIS — S72002D Fracture of unspecified part of neck of left femur, subsequent encounter for closed fracture with routine healing: Secondary | ICD-10-CM | POA: Diagnosis not present

## 2020-01-11 DIAGNOSIS — Z85118 Personal history of other malignant neoplasm of bronchus and lung: Secondary | ICD-10-CM | POA: Diagnosis not present

## 2020-01-11 DIAGNOSIS — J9611 Chronic respiratory failure with hypoxia: Secondary | ICD-10-CM | POA: Diagnosis not present

## 2020-01-15 DIAGNOSIS — J9611 Chronic respiratory failure with hypoxia: Secondary | ICD-10-CM | POA: Diagnosis not present

## 2020-01-15 DIAGNOSIS — Z741 Need for assistance with personal care: Secondary | ICD-10-CM | POA: Diagnosis not present

## 2020-01-15 DIAGNOSIS — J440 Chronic obstructive pulmonary disease with acute lower respiratory infection: Secondary | ICD-10-CM | POA: Diagnosis not present

## 2020-01-15 DIAGNOSIS — Z85118 Personal history of other malignant neoplasm of bronchus and lung: Secondary | ICD-10-CM | POA: Diagnosis not present

## 2020-01-15 DIAGNOSIS — S72002D Fracture of unspecified part of neck of left femur, subsequent encounter for closed fracture with routine healing: Secondary | ICD-10-CM | POA: Diagnosis not present

## 2020-01-15 DIAGNOSIS — J189 Pneumonia, unspecified organism: Secondary | ICD-10-CM | POA: Diagnosis not present

## 2020-01-16 DIAGNOSIS — Z85118 Personal history of other malignant neoplasm of bronchus and lung: Secondary | ICD-10-CM | POA: Diagnosis not present

## 2020-01-16 DIAGNOSIS — J189 Pneumonia, unspecified organism: Secondary | ICD-10-CM | POA: Diagnosis not present

## 2020-01-16 DIAGNOSIS — Z741 Need for assistance with personal care: Secondary | ICD-10-CM | POA: Diagnosis not present

## 2020-01-16 DIAGNOSIS — J440 Chronic obstructive pulmonary disease with acute lower respiratory infection: Secondary | ICD-10-CM | POA: Diagnosis not present

## 2020-01-16 DIAGNOSIS — J9611 Chronic respiratory failure with hypoxia: Secondary | ICD-10-CM | POA: Diagnosis not present

## 2020-01-16 DIAGNOSIS — S72002D Fracture of unspecified part of neck of left femur, subsequent encounter for closed fracture with routine healing: Secondary | ICD-10-CM | POA: Diagnosis not present

## 2020-01-17 DIAGNOSIS — J189 Pneumonia, unspecified organism: Secondary | ICD-10-CM | POA: Diagnosis not present

## 2020-01-17 DIAGNOSIS — Z741 Need for assistance with personal care: Secondary | ICD-10-CM | POA: Diagnosis not present

## 2020-01-17 DIAGNOSIS — Z85118 Personal history of other malignant neoplasm of bronchus and lung: Secondary | ICD-10-CM | POA: Diagnosis not present

## 2020-01-17 DIAGNOSIS — S72002D Fracture of unspecified part of neck of left femur, subsequent encounter for closed fracture with routine healing: Secondary | ICD-10-CM | POA: Diagnosis not present

## 2020-01-17 DIAGNOSIS — J440 Chronic obstructive pulmonary disease with acute lower respiratory infection: Secondary | ICD-10-CM | POA: Diagnosis not present

## 2020-01-17 DIAGNOSIS — J9611 Chronic respiratory failure with hypoxia: Secondary | ICD-10-CM | POA: Diagnosis not present

## 2020-01-18 DIAGNOSIS — J440 Chronic obstructive pulmonary disease with acute lower respiratory infection: Secondary | ICD-10-CM | POA: Diagnosis not present

## 2020-01-18 DIAGNOSIS — S72002D Fracture of unspecified part of neck of left femur, subsequent encounter for closed fracture with routine healing: Secondary | ICD-10-CM | POA: Diagnosis not present

## 2020-01-18 DIAGNOSIS — J9611 Chronic respiratory failure with hypoxia: Secondary | ICD-10-CM | POA: Diagnosis not present

## 2020-01-18 DIAGNOSIS — J189 Pneumonia, unspecified organism: Secondary | ICD-10-CM | POA: Diagnosis not present

## 2020-01-18 DIAGNOSIS — Z741 Need for assistance with personal care: Secondary | ICD-10-CM | POA: Diagnosis not present

## 2020-01-18 DIAGNOSIS — Z85118 Personal history of other malignant neoplasm of bronchus and lung: Secondary | ICD-10-CM | POA: Diagnosis not present

## 2020-01-21 DIAGNOSIS — S72002D Fracture of unspecified part of neck of left femur, subsequent encounter for closed fracture with routine healing: Secondary | ICD-10-CM | POA: Diagnosis not present

## 2020-01-21 DIAGNOSIS — Z741 Need for assistance with personal care: Secondary | ICD-10-CM | POA: Diagnosis not present

## 2020-01-21 DIAGNOSIS — J440 Chronic obstructive pulmonary disease with acute lower respiratory infection: Secondary | ICD-10-CM | POA: Diagnosis not present

## 2020-01-21 DIAGNOSIS — J189 Pneumonia, unspecified organism: Secondary | ICD-10-CM | POA: Diagnosis not present

## 2020-01-21 DIAGNOSIS — Z85118 Personal history of other malignant neoplasm of bronchus and lung: Secondary | ICD-10-CM | POA: Diagnosis not present

## 2020-01-21 DIAGNOSIS — J9611 Chronic respiratory failure with hypoxia: Secondary | ICD-10-CM | POA: Diagnosis not present

## 2020-01-22 DIAGNOSIS — S72002D Fracture of unspecified part of neck of left femur, subsequent encounter for closed fracture with routine healing: Secondary | ICD-10-CM | POA: Diagnosis not present

## 2020-01-22 DIAGNOSIS — J440 Chronic obstructive pulmonary disease with acute lower respiratory infection: Secondary | ICD-10-CM | POA: Diagnosis not present

## 2020-01-22 DIAGNOSIS — J189 Pneumonia, unspecified organism: Secondary | ICD-10-CM | POA: Diagnosis not present

## 2020-01-22 DIAGNOSIS — J9611 Chronic respiratory failure with hypoxia: Secondary | ICD-10-CM | POA: Diagnosis not present

## 2020-01-22 DIAGNOSIS — Z741 Need for assistance with personal care: Secondary | ICD-10-CM | POA: Diagnosis not present

## 2020-01-22 DIAGNOSIS — Z85118 Personal history of other malignant neoplasm of bronchus and lung: Secondary | ICD-10-CM | POA: Diagnosis not present

## 2020-01-23 DIAGNOSIS — S72002D Fracture of unspecified part of neck of left femur, subsequent encounter for closed fracture with routine healing: Secondary | ICD-10-CM | POA: Diagnosis not present

## 2020-01-23 DIAGNOSIS — J440 Chronic obstructive pulmonary disease with acute lower respiratory infection: Secondary | ICD-10-CM | POA: Diagnosis not present

## 2020-01-23 DIAGNOSIS — Z85118 Personal history of other malignant neoplasm of bronchus and lung: Secondary | ICD-10-CM | POA: Diagnosis not present

## 2020-01-23 DIAGNOSIS — J189 Pneumonia, unspecified organism: Secondary | ICD-10-CM | POA: Diagnosis not present

## 2020-01-23 DIAGNOSIS — J9611 Chronic respiratory failure with hypoxia: Secondary | ICD-10-CM | POA: Diagnosis not present

## 2020-01-23 DIAGNOSIS — Z741 Need for assistance with personal care: Secondary | ICD-10-CM | POA: Diagnosis not present

## 2020-01-24 DIAGNOSIS — J440 Chronic obstructive pulmonary disease with acute lower respiratory infection: Secondary | ICD-10-CM | POA: Diagnosis not present

## 2020-01-24 DIAGNOSIS — Z741 Need for assistance with personal care: Secondary | ICD-10-CM | POA: Diagnosis not present

## 2020-01-24 DIAGNOSIS — J9611 Chronic respiratory failure with hypoxia: Secondary | ICD-10-CM | POA: Diagnosis not present

## 2020-01-24 DIAGNOSIS — S72002D Fracture of unspecified part of neck of left femur, subsequent encounter for closed fracture with routine healing: Secondary | ICD-10-CM | POA: Diagnosis not present

## 2020-01-24 DIAGNOSIS — J189 Pneumonia, unspecified organism: Secondary | ICD-10-CM | POA: Diagnosis not present

## 2020-01-24 DIAGNOSIS — Z85118 Personal history of other malignant neoplasm of bronchus and lung: Secondary | ICD-10-CM | POA: Diagnosis not present

## 2020-01-25 DIAGNOSIS — Z741 Need for assistance with personal care: Secondary | ICD-10-CM | POA: Diagnosis not present

## 2020-01-25 DIAGNOSIS — J440 Chronic obstructive pulmonary disease with acute lower respiratory infection: Secondary | ICD-10-CM | POA: Diagnosis not present

## 2020-01-25 DIAGNOSIS — J9611 Chronic respiratory failure with hypoxia: Secondary | ICD-10-CM | POA: Diagnosis not present

## 2020-01-25 DIAGNOSIS — S72002D Fracture of unspecified part of neck of left femur, subsequent encounter for closed fracture with routine healing: Secondary | ICD-10-CM | POA: Diagnosis not present

## 2020-01-25 DIAGNOSIS — J189 Pneumonia, unspecified organism: Secondary | ICD-10-CM | POA: Diagnosis not present

## 2020-01-25 DIAGNOSIS — Z85118 Personal history of other malignant neoplasm of bronchus and lung: Secondary | ICD-10-CM | POA: Diagnosis not present

## 2020-01-28 DIAGNOSIS — Z741 Need for assistance with personal care: Secondary | ICD-10-CM | POA: Diagnosis not present

## 2020-01-28 DIAGNOSIS — J440 Chronic obstructive pulmonary disease with acute lower respiratory infection: Secondary | ICD-10-CM | POA: Diagnosis not present

## 2020-01-28 DIAGNOSIS — Z85118 Personal history of other malignant neoplasm of bronchus and lung: Secondary | ICD-10-CM | POA: Diagnosis not present

## 2020-01-28 DIAGNOSIS — J189 Pneumonia, unspecified organism: Secondary | ICD-10-CM | POA: Diagnosis not present

## 2020-01-28 DIAGNOSIS — S72002D Fracture of unspecified part of neck of left femur, subsequent encounter for closed fracture with routine healing: Secondary | ICD-10-CM | POA: Diagnosis not present

## 2020-01-28 DIAGNOSIS — J9611 Chronic respiratory failure with hypoxia: Secondary | ICD-10-CM | POA: Diagnosis not present

## 2020-01-30 DIAGNOSIS — J189 Pneumonia, unspecified organism: Secondary | ICD-10-CM | POA: Diagnosis not present

## 2020-01-30 DIAGNOSIS — J9611 Chronic respiratory failure with hypoxia: Secondary | ICD-10-CM | POA: Diagnosis not present

## 2020-01-30 DIAGNOSIS — J440 Chronic obstructive pulmonary disease with acute lower respiratory infection: Secondary | ICD-10-CM | POA: Diagnosis not present

## 2020-01-30 DIAGNOSIS — Z741 Need for assistance with personal care: Secondary | ICD-10-CM | POA: Diagnosis not present

## 2020-01-30 DIAGNOSIS — S72002D Fracture of unspecified part of neck of left femur, subsequent encounter for closed fracture with routine healing: Secondary | ICD-10-CM | POA: Diagnosis not present

## 2020-01-30 DIAGNOSIS — Z85118 Personal history of other malignant neoplasm of bronchus and lung: Secondary | ICD-10-CM | POA: Diagnosis not present

## 2020-01-31 DIAGNOSIS — J440 Chronic obstructive pulmonary disease with acute lower respiratory infection: Secondary | ICD-10-CM | POA: Diagnosis not present

## 2020-01-31 DIAGNOSIS — S72002D Fracture of unspecified part of neck of left femur, subsequent encounter for closed fracture with routine healing: Secondary | ICD-10-CM | POA: Diagnosis not present

## 2020-01-31 DIAGNOSIS — J9611 Chronic respiratory failure with hypoxia: Secondary | ICD-10-CM | POA: Diagnosis not present

## 2020-01-31 DIAGNOSIS — Z85118 Personal history of other malignant neoplasm of bronchus and lung: Secondary | ICD-10-CM | POA: Diagnosis not present

## 2020-01-31 DIAGNOSIS — Z741 Need for assistance with personal care: Secondary | ICD-10-CM | POA: Diagnosis not present

## 2020-01-31 DIAGNOSIS — J189 Pneumonia, unspecified organism: Secondary | ICD-10-CM | POA: Diagnosis not present

## 2020-02-01 DIAGNOSIS — Z85118 Personal history of other malignant neoplasm of bronchus and lung: Secondary | ICD-10-CM | POA: Diagnosis not present

## 2020-02-01 DIAGNOSIS — Z741 Need for assistance with personal care: Secondary | ICD-10-CM | POA: Diagnosis not present

## 2020-02-01 DIAGNOSIS — J440 Chronic obstructive pulmonary disease with acute lower respiratory infection: Secondary | ICD-10-CM | POA: Diagnosis not present

## 2020-02-01 DIAGNOSIS — S72002D Fracture of unspecified part of neck of left femur, subsequent encounter for closed fracture with routine healing: Secondary | ICD-10-CM | POA: Diagnosis not present

## 2020-02-01 DIAGNOSIS — J9611 Chronic respiratory failure with hypoxia: Secondary | ICD-10-CM | POA: Diagnosis not present

## 2020-02-01 DIAGNOSIS — J189 Pneumonia, unspecified organism: Secondary | ICD-10-CM | POA: Diagnosis not present

## 2020-02-02 DIAGNOSIS — J440 Chronic obstructive pulmonary disease with acute lower respiratory infection: Secondary | ICD-10-CM | POA: Diagnosis not present

## 2020-02-02 DIAGNOSIS — J189 Pneumonia, unspecified organism: Secondary | ICD-10-CM | POA: Diagnosis not present

## 2020-02-02 DIAGNOSIS — S72002D Fracture of unspecified part of neck of left femur, subsequent encounter for closed fracture with routine healing: Secondary | ICD-10-CM | POA: Diagnosis not present

## 2020-02-02 DIAGNOSIS — J9611 Chronic respiratory failure with hypoxia: Secondary | ICD-10-CM | POA: Diagnosis not present

## 2020-02-02 DIAGNOSIS — Z741 Need for assistance with personal care: Secondary | ICD-10-CM | POA: Diagnosis not present

## 2020-02-02 DIAGNOSIS — Z85118 Personal history of other malignant neoplasm of bronchus and lung: Secondary | ICD-10-CM | POA: Diagnosis not present

## 2020-02-03 DIAGNOSIS — Z85118 Personal history of other malignant neoplasm of bronchus and lung: Secondary | ICD-10-CM | POA: Diagnosis not present

## 2020-02-03 DIAGNOSIS — J9611 Chronic respiratory failure with hypoxia: Secondary | ICD-10-CM | POA: Diagnosis not present

## 2020-02-03 DIAGNOSIS — S72002D Fracture of unspecified part of neck of left femur, subsequent encounter for closed fracture with routine healing: Secondary | ICD-10-CM | POA: Diagnosis not present

## 2020-02-03 DIAGNOSIS — Z741 Need for assistance with personal care: Secondary | ICD-10-CM | POA: Diagnosis not present

## 2020-02-03 DIAGNOSIS — J440 Chronic obstructive pulmonary disease with acute lower respiratory infection: Secondary | ICD-10-CM | POA: Diagnosis not present

## 2020-02-03 DIAGNOSIS — J189 Pneumonia, unspecified organism: Secondary | ICD-10-CM | POA: Diagnosis not present

## 2020-02-04 DIAGNOSIS — J440 Chronic obstructive pulmonary disease with acute lower respiratory infection: Secondary | ICD-10-CM | POA: Diagnosis not present

## 2020-02-04 DIAGNOSIS — S72002D Fracture of unspecified part of neck of left femur, subsequent encounter for closed fracture with routine healing: Secondary | ICD-10-CM | POA: Diagnosis not present

## 2020-02-04 DIAGNOSIS — J189 Pneumonia, unspecified organism: Secondary | ICD-10-CM | POA: Diagnosis not present

## 2020-02-04 DIAGNOSIS — J9611 Chronic respiratory failure with hypoxia: Secondary | ICD-10-CM | POA: Diagnosis not present

## 2020-02-04 DIAGNOSIS — Z85118 Personal history of other malignant neoplasm of bronchus and lung: Secondary | ICD-10-CM | POA: Diagnosis not present

## 2020-02-04 DIAGNOSIS — Z741 Need for assistance with personal care: Secondary | ICD-10-CM | POA: Diagnosis not present

## 2020-02-22 ENCOUNTER — Telehealth: Payer: BLUE CROSS/BLUE SHIELD | Admitting: General Practice

## 2020-04-03 ENCOUNTER — Telehealth: Payer: Self-pay | Admitting: Internal Medicine

## 2020-04-03 NOTE — Telephone Encounter (Signed)
Condolence card has been obtained and given to MR. Routing this encounter to MR.

## 2020-04-03 NOTE — Telephone Encounter (Signed)
Signed and left it on pod B desk near printer. Sad to see her pass away. One of the most gentle beautiful souls

## 2020-04-07 DEATH — deceased

## 2020-08-24 IMAGING — RF DG FEMUR 2+V*L*
1 series · 6 of 6 positions shown · non-contrast
Comparison: Plain films left hip 05/26/2019.

CLINICAL DATA: Intraoperative imaging for fixation of a left
intertrochanteric fracture which the patient suffered in a fall
05/26/2019. Initial encounter.

EXAM:
DG C-ARM 1-60 MIN; LEFT FEMUR 2 VIEWS

[Series 1: run · 6 of 6 slices shown]
[im 1/6]
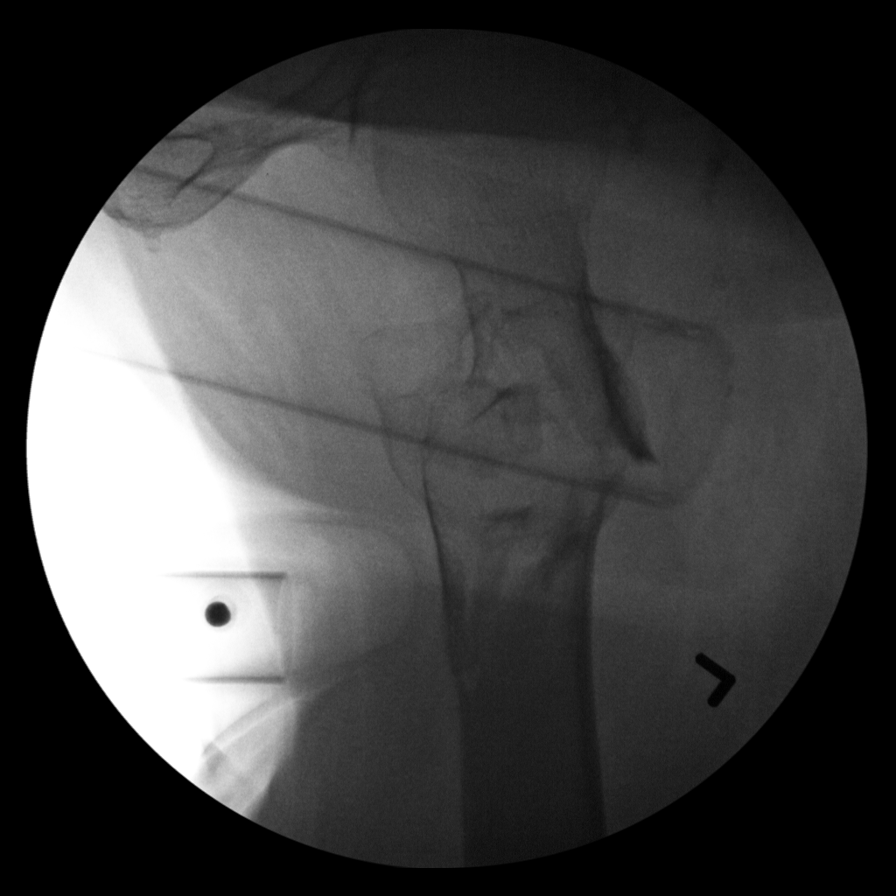
[im 2/6]
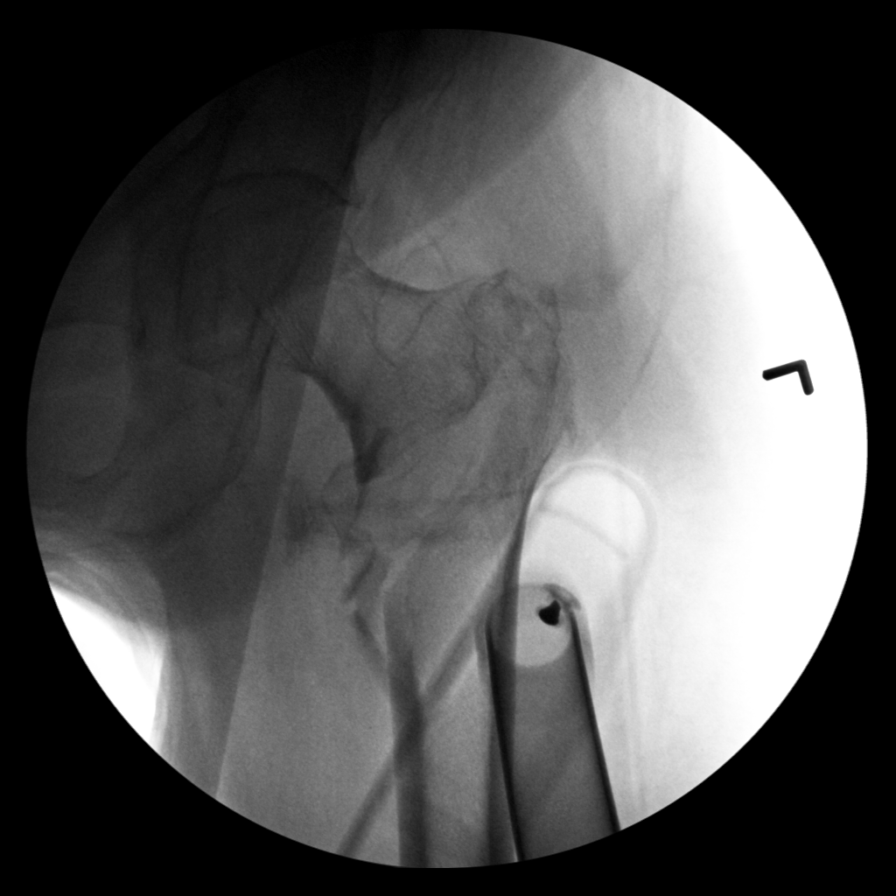
[im 3/6]
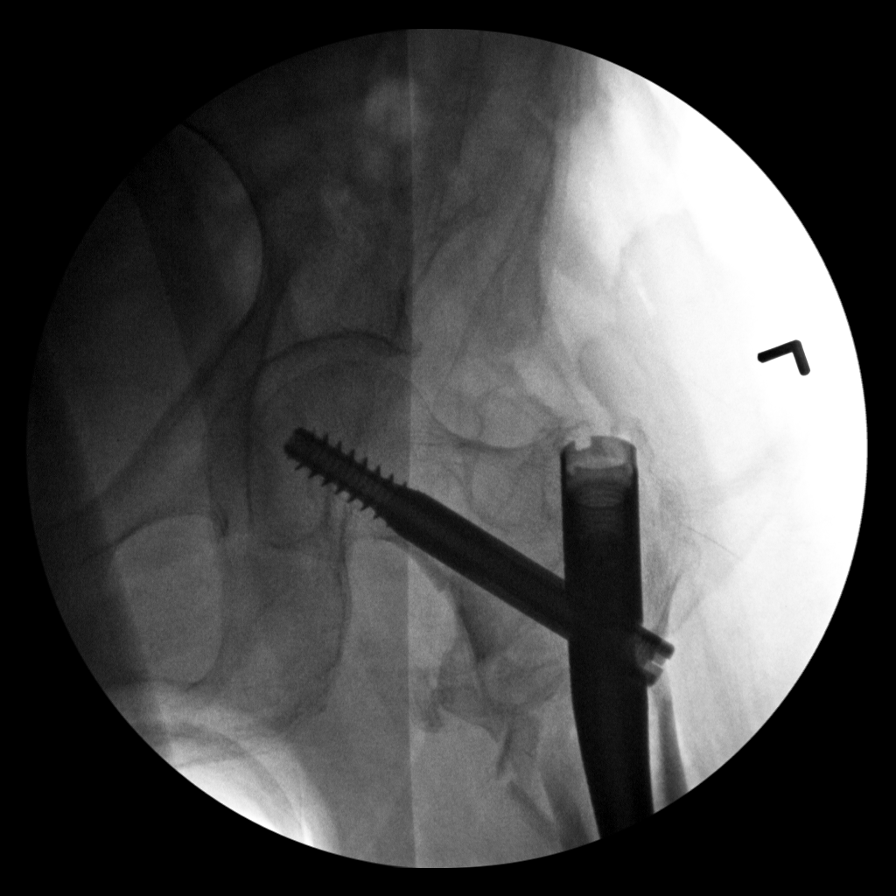
[im 4/6]
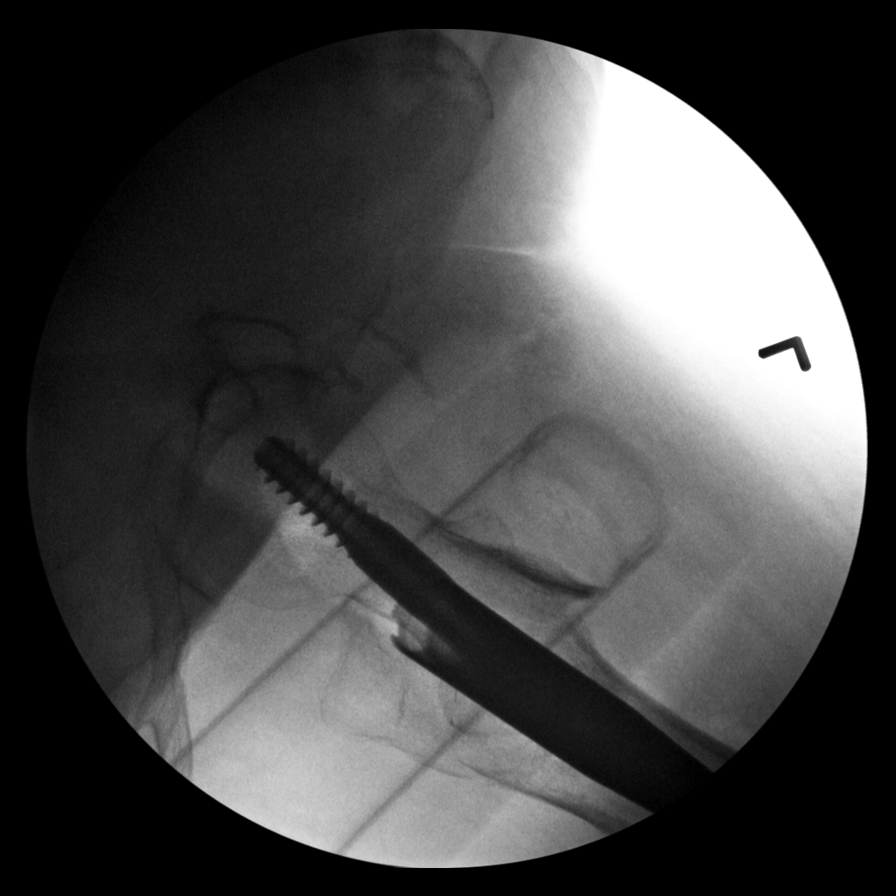
[im 5/6]
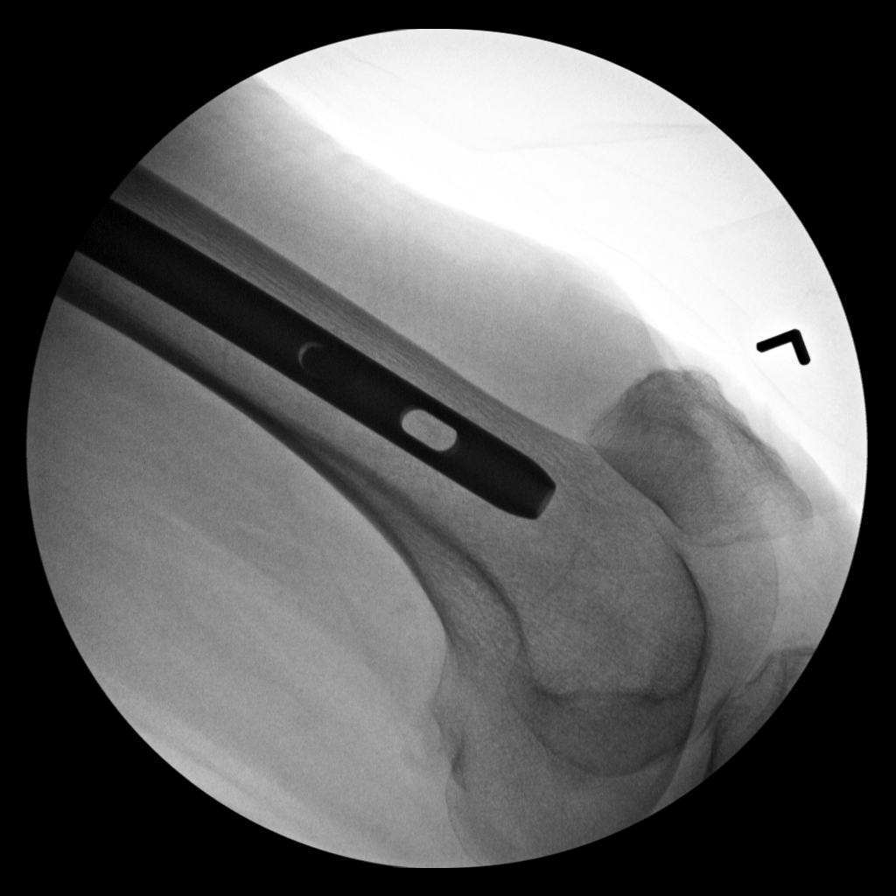
[im 6/6]
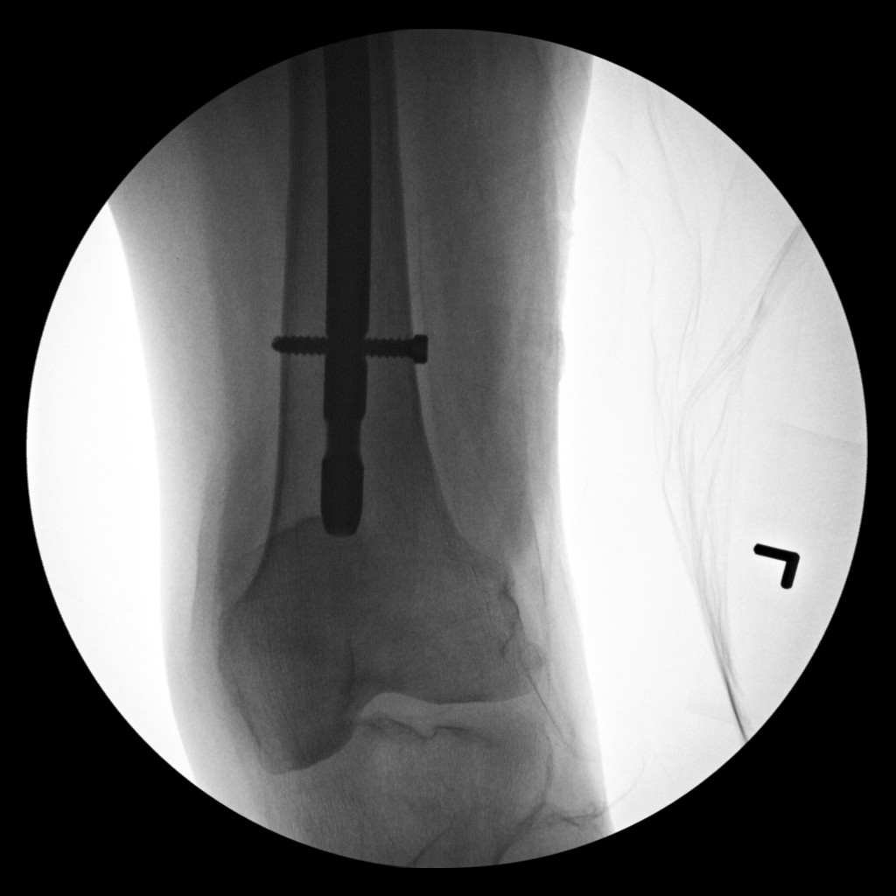

[6 of 6 positions shown; findings below may reference images not displayed]

FINDINGS: Six fluoroscopic intraoperative spot views demonstrate placement of
a hip screw and long intramedullary nail with a single distal screw
for fixation of a comminuted left intertrochanteric fracture.
Hardware is intact. Position and alignment are improved. No acute
abnormality.
IMPRESSION: Intraoperative imaging for fixation of a left intertrochanteric
fracture. No acute finding.

## 2024-01-11 NOTE — Telephone Encounter (Signed)
 SABRA
